# Patient Record
Sex: Female | Born: 1945
Health system: Southern US, Community
[De-identification: ages and names within clinical notes are randomized; demographics above are authoritative.]

## PROBLEM LIST (undated history)

## (undated) DIAGNOSIS — Z8542 Personal history of malignant neoplasm of other parts of uterus: Secondary | ICD-10-CM

## (undated) DIAGNOSIS — K219 Gastro-esophageal reflux disease without esophagitis: Secondary | ICD-10-CM

## (undated) DIAGNOSIS — D473 Essential (hemorrhagic) thrombocythemia: Secondary | ICD-10-CM

## (undated) DIAGNOSIS — K2270 Barrett's esophagus without dysplasia: Secondary | ICD-10-CM

## (undated) DIAGNOSIS — M7710 Lateral epicondylitis, unspecified elbow: Secondary | ICD-10-CM

## (undated) DIAGNOSIS — Z8719 Personal history of other diseases of the digestive system: Secondary | ICD-10-CM

## (undated) DIAGNOSIS — N200 Calculus of kidney: Secondary | ICD-10-CM

## (undated) DIAGNOSIS — J449 Chronic obstructive pulmonary disease, unspecified: Secondary | ICD-10-CM

## (undated) DIAGNOSIS — M6520 Calcific tendinitis, unspecified site: Secondary | ICD-10-CM

## (undated) DIAGNOSIS — R0602 Shortness of breath: Secondary | ICD-10-CM

## (undated) DIAGNOSIS — F32A Depression, unspecified: Secondary | ICD-10-CM

## (undated) DIAGNOSIS — F329 Major depressive disorder, single episode, unspecified: Secondary | ICD-10-CM

## (undated) DIAGNOSIS — E875 Hyperkalemia: Secondary | ICD-10-CM

## (undated) HISTORY — DX: Calcific tendinitis, unspecified site: M65.20

## (undated) HISTORY — DX: Hyperkalemia: E87.5

## (undated) HISTORY — DX: Chronic obstructive pulmonary disease, unspecified: J44.9

## (undated) HISTORY — DX: Essential (hemorrhagic) thrombocythemia: D47.3

## (undated) HISTORY — DX: Personal history of other diseases of the digestive system: Z87.19

## (undated) HISTORY — PX: ABDOMINAL HYSTERECTOMY: SHX81

## (undated) HISTORY — DX: Personal history of malignant neoplasm of other parts of uterus: Z85.42

## (undated) HISTORY — PX: TONSILLECTOMY AND ADENOIDECTOMY: SUR1326

## (undated) HISTORY — DX: Major depressive disorder, single episode, unspecified: F32.9

## (undated) HISTORY — PX: CATARACT EXTRACTION: SUR2

## (undated) HISTORY — DX: Depression, unspecified: F32.A

## (undated) HISTORY — DX: Lateral epicondylitis, unspecified elbow: M77.10

## (undated) HISTORY — DX: Calculus of kidney: N20.0

## (undated) HISTORY — PX: LITHOTRIPSY: SUR834

## (undated) HISTORY — DX: Barrett's esophagus without dysplasia: K22.70

## (undated) HISTORY — DX: Gastro-esophageal reflux disease without esophagitis: K21.9

---

## 1997-11-11 HISTORY — PX: SPLENECTOMY: SUR1306

## 2000-12-07 ENCOUNTER — Emergency Department (HOSPITAL_COMMUNITY): Admission: EM | Admit: 2000-12-07 | Discharge: 2000-12-08 | Payer: Self-pay | Admitting: Emergency Medicine

## 2001-03-31 ENCOUNTER — Other Ambulatory Visit: Admission: RE | Admit: 2001-03-31 | Discharge: 2001-03-31 | Payer: Self-pay | Admitting: Internal Medicine

## 2001-08-13 ENCOUNTER — Inpatient Hospital Stay (HOSPITAL_COMMUNITY): Admission: EM | Admit: 2001-08-13 | Discharge: 2001-08-17 | Payer: Self-pay | Admitting: Emergency Medicine

## 2001-08-13 ENCOUNTER — Encounter: Payer: Self-pay | Admitting: Emergency Medicine

## 2001-08-14 ENCOUNTER — Encounter: Payer: Self-pay | Admitting: Internal Medicine

## 2002-10-19 ENCOUNTER — Encounter: Admission: RE | Admit: 2002-10-19 | Discharge: 2002-10-19 | Payer: Self-pay | Admitting: Internal Medicine

## 2002-10-19 ENCOUNTER — Encounter: Payer: Self-pay | Admitting: Internal Medicine

## 2004-12-25 ENCOUNTER — Ambulatory Visit: Payer: Self-pay | Admitting: Internal Medicine

## 2004-12-25 ENCOUNTER — Ambulatory Visit (HOSPITAL_COMMUNITY): Admission: RE | Admit: 2004-12-25 | Discharge: 2004-12-25 | Payer: Self-pay | Admitting: Internal Medicine

## 2005-01-29 ENCOUNTER — Ambulatory Visit: Payer: Self-pay | Admitting: Internal Medicine

## 2005-03-20 ENCOUNTER — Ambulatory Visit: Payer: Self-pay | Admitting: Internal Medicine

## 2005-06-03 ENCOUNTER — Ambulatory Visit: Payer: Self-pay | Admitting: Internal Medicine

## 2005-07-19 ENCOUNTER — Ambulatory Visit: Payer: Self-pay | Admitting: Internal Medicine

## 2005-07-22 ENCOUNTER — Ambulatory Visit: Payer: Self-pay | Admitting: Cardiology

## 2005-08-19 ENCOUNTER — Ambulatory Visit: Payer: Self-pay | Admitting: Internal Medicine

## 2005-12-10 ENCOUNTER — Ambulatory Visit: Payer: Self-pay | Admitting: Internal Medicine

## 2005-12-10 ENCOUNTER — Observation Stay (HOSPITAL_COMMUNITY): Admission: EM | Admit: 2005-12-10 | Discharge: 2005-12-11 | Payer: Self-pay | Admitting: Emergency Medicine

## 2005-12-18 ENCOUNTER — Ambulatory Visit: Payer: Self-pay | Admitting: Gastroenterology

## 2006-03-11 ENCOUNTER — Ambulatory Visit: Payer: Self-pay | Admitting: Internal Medicine

## 2006-05-02 ENCOUNTER — Ambulatory Visit: Payer: Self-pay | Admitting: Internal Medicine

## 2006-05-26 ENCOUNTER — Ambulatory Visit: Payer: Self-pay | Admitting: Internal Medicine

## 2006-07-26 ENCOUNTER — Ambulatory Visit: Payer: Self-pay | Admitting: Family Medicine

## 2006-08-05 ENCOUNTER — Ambulatory Visit: Payer: Self-pay | Admitting: Internal Medicine

## 2006-08-12 ENCOUNTER — Ambulatory Visit: Payer: Self-pay | Admitting: Critical Care Medicine

## 2006-09-02 ENCOUNTER — Ambulatory Visit: Payer: Self-pay | Admitting: Critical Care Medicine

## 2006-11-14 ENCOUNTER — Ambulatory Visit: Payer: Self-pay | Admitting: Internal Medicine

## 2006-11-17 ENCOUNTER — Ambulatory Visit: Payer: Self-pay | Admitting: Critical Care Medicine

## 2006-12-25 ENCOUNTER — Ambulatory Visit: Payer: Self-pay | Admitting: Internal Medicine

## 2007-03-06 ENCOUNTER — Ambulatory Visit: Payer: Self-pay | Admitting: Critical Care Medicine

## 2007-09-15 DIAGNOSIS — J441 Chronic obstructive pulmonary disease with (acute) exacerbation: Secondary | ICD-10-CM

## 2007-09-15 DIAGNOSIS — K2270 Barrett's esophagus without dysplasia: Secondary | ICD-10-CM | POA: Insufficient documentation

## 2007-09-15 HISTORY — DX: Chronic obstructive pulmonary disease with (acute) exacerbation: J44.1

## 2007-09-26 ENCOUNTER — Encounter: Payer: Self-pay | Admitting: *Deleted

## 2007-09-26 DIAGNOSIS — Z8542 Personal history of malignant neoplasm of other parts of uterus: Secondary | ICD-10-CM | POA: Insufficient documentation

## 2007-09-26 DIAGNOSIS — M6520 Calcific tendinitis, unspecified site: Secondary | ICD-10-CM | POA: Insufficient documentation

## 2007-09-26 DIAGNOSIS — M7710 Lateral epicondylitis, unspecified elbow: Secondary | ICD-10-CM

## 2007-09-26 DIAGNOSIS — Z8719 Personal history of other diseases of the digestive system: Secondary | ICD-10-CM | POA: Insufficient documentation

## 2007-09-26 DIAGNOSIS — Z9079 Acquired absence of other genital organ(s): Secondary | ICD-10-CM

## 2007-09-26 DIAGNOSIS — K219 Gastro-esophageal reflux disease without esophagitis: Secondary | ICD-10-CM | POA: Insufficient documentation

## 2007-09-26 HISTORY — DX: Acquired absence of other genital organ(s): Z90.79

## 2007-09-26 HISTORY — DX: Lateral epicondylitis, unspecified elbow: M77.10

## 2007-12-02 ENCOUNTER — Telehealth: Payer: Self-pay | Admitting: Internal Medicine

## 2008-01-01 ENCOUNTER — Ambulatory Visit: Payer: Self-pay | Admitting: Internal Medicine

## 2008-01-05 LAB — CONVERTED CEMR LAB
ALT: 24 units/L (ref 0–35)
BUN: 14 mg/dL (ref 6–23)
Basophils Absolute: 0.1 10*3/uL (ref 0.0–0.1)
Bilirubin Urine: NEGATIVE
Bilirubin, Direct: 0.1 mg/dL (ref 0.0–0.3)
CO2: 32 meq/L (ref 19–32)
Chloride: 103 meq/L (ref 96–112)
Creatinine, Ser: 0.6 mg/dL (ref 0.4–1.2)
Direct LDL: 187.1 mg/dL
Eosinophils Relative: 3.5 % (ref 0.0–5.0)
HCT: 40.2 % (ref 36.0–46.0)
Hemoglobin, Urine: NEGATIVE
Ketones, ur: NEGATIVE mg/dL
Leukocytes, UA: NEGATIVE
Lymphocytes Relative: 43.3 % (ref 12.0–46.0)
MCHC: 34.2 g/dL (ref 30.0–36.0)
Neutro Abs: 5 10*3/uL (ref 1.4–7.7)
Neutrophils Relative %: 44.8 % (ref 43.0–77.0)
Platelets: 337 10*3/uL (ref 150–400)
Potassium: 4.7 meq/L (ref 3.5–5.1)
RBC: 4.36 M/uL (ref 3.87–5.11)
Sodium: 140 meq/L (ref 135–145)
Total Bilirubin: 0.6 mg/dL (ref 0.3–1.2)
Total Protein, Urine: NEGATIVE mg/dL
Total Protein: 6.5 g/dL (ref 6.0–8.3)
Triglycerides: 168 mg/dL — ABNORMAL HIGH (ref 0–149)
Urine Glucose: >=1000 mg/dL — CR
WBC: 11.1 10*3/uL — ABNORMAL HIGH (ref 4.5–10.5)

## 2008-01-08 ENCOUNTER — Telehealth (INDEPENDENT_AMBULATORY_CARE_PROVIDER_SITE_OTHER): Payer: Self-pay | Admitting: *Deleted

## 2008-01-08 ENCOUNTER — Ambulatory Visit: Payer: Self-pay | Admitting: Endocrinology

## 2008-01-08 ENCOUNTER — Ambulatory Visit: Payer: Self-pay | Admitting: Critical Care Medicine

## 2008-01-08 DIAGNOSIS — J309 Allergic rhinitis, unspecified: Secondary | ICD-10-CM | POA: Insufficient documentation

## 2008-01-08 HISTORY — DX: Allergic rhinitis, unspecified: J30.9

## 2008-01-15 ENCOUNTER — Telehealth: Payer: Self-pay | Admitting: Endocrinology

## 2008-01-19 ENCOUNTER — Ambulatory Visit: Payer: Self-pay | Admitting: Endocrinology

## 2008-02-09 ENCOUNTER — Ambulatory Visit: Payer: Self-pay | Admitting: Internal Medicine

## 2008-02-09 DIAGNOSIS — R0989 Other specified symptoms and signs involving the circulatory and respiratory systems: Secondary | ICD-10-CM

## 2008-02-09 HISTORY — DX: Other specified symptoms and signs involving the circulatory and respiratory systems: R09.89

## 2008-02-12 ENCOUNTER — Encounter: Payer: Self-pay | Admitting: Internal Medicine

## 2008-02-15 ENCOUNTER — Ambulatory Visit: Payer: Self-pay | Admitting: Endocrinology

## 2008-02-15 ENCOUNTER — Encounter: Admission: RE | Admit: 2008-02-15 | Discharge: 2008-05-15 | Payer: Self-pay | Admitting: Endocrinology

## 2008-02-15 ENCOUNTER — Ambulatory Visit: Payer: Self-pay | Admitting: Internal Medicine

## 2008-02-24 ENCOUNTER — Encounter: Admission: RE | Admit: 2008-02-24 | Discharge: 2008-02-24 | Payer: Self-pay | Admitting: Internal Medicine

## 2008-02-25 ENCOUNTER — Ambulatory Visit: Payer: Self-pay

## 2008-05-16 ENCOUNTER — Ambulatory Visit: Payer: Self-pay | Admitting: Endocrinology

## 2008-05-16 LAB — CONVERTED CEMR LAB: Hgb A1c MFr Bld: 7.3 % — ABNORMAL HIGH (ref 4.6–6.0)

## 2008-08-08 ENCOUNTER — Ambulatory Visit: Payer: Self-pay | Admitting: Internal Medicine

## 2008-08-09 LAB — CONVERTED CEMR LAB
ALT: 23 units/L (ref 0–35)
AST: 20 units/L (ref 0–37)
Albumin: 3.7 g/dL (ref 3.5–5.2)
Basophils Absolute: 0.1 10*3/uL (ref 0.0–0.1)
Basophils Relative: 0.8 % (ref 0.0–3.0)
Bilirubin, Direct: 0.1 mg/dL (ref 0.0–0.3)
HCT: 39.2 % (ref 36.0–46.0)
Hgb A1c MFr Bld: 7 % — ABNORMAL HIGH (ref 4.6–6.0)
MCHC: 34.4 g/dL (ref 30.0–36.0)
Neutro Abs: 4.6 10*3/uL (ref 1.4–7.7)
RBC: 4.17 M/uL (ref 3.87–5.11)
RDW: 12.4 % (ref 11.5–14.6)
TSH: 0.89 microintl units/mL (ref 0.35–5.50)
Total Protein: 6.6 g/dL (ref 6.0–8.3)
WBC: 9.7 10*3/uL (ref 4.5–10.5)

## 2008-08-15 ENCOUNTER — Ambulatory Visit: Payer: Self-pay | Admitting: Endocrinology

## 2008-08-15 ENCOUNTER — Ambulatory Visit: Payer: Self-pay | Admitting: Internal Medicine

## 2008-09-23 ENCOUNTER — Telehealth: Payer: Self-pay | Admitting: Endocrinology

## 2008-12-16 ENCOUNTER — Ambulatory Visit: Payer: Self-pay | Admitting: Internal Medicine

## 2008-12-19 ENCOUNTER — Ambulatory Visit: Payer: Self-pay | Admitting: Internal Medicine

## 2008-12-19 LAB — CONVERTED CEMR LAB
GFR calc Af Amer: 161 mL/min
GFR calc non Af Amer: 133 mL/min
TSH: 0.67 microintl units/mL (ref 0.35–5.50)

## 2008-12-30 ENCOUNTER — Ambulatory Visit: Payer: Self-pay | Admitting: Internal Medicine

## 2008-12-30 DIAGNOSIS — R197 Diarrhea, unspecified: Secondary | ICD-10-CM | POA: Insufficient documentation

## 2009-03-26 ENCOUNTER — Encounter: Admission: RE | Admit: 2009-03-26 | Discharge: 2009-03-26 | Payer: Self-pay | Admitting: Orthopaedic Surgery

## 2009-04-13 ENCOUNTER — Telehealth: Payer: Self-pay | Admitting: Internal Medicine

## 2009-04-21 ENCOUNTER — Encounter: Payer: Self-pay | Admitting: Internal Medicine

## 2009-05-12 ENCOUNTER — Encounter: Payer: Self-pay | Admitting: Internal Medicine

## 2009-06-07 ENCOUNTER — Ambulatory Visit: Payer: Self-pay | Admitting: Internal Medicine

## 2009-06-07 LAB — CONVERTED CEMR LAB
AST: 25 units/L (ref 0–37)
Alkaline Phosphatase: 47 units/L (ref 39–117)
BUN: 12 mg/dL (ref 6–23)
Bilirubin, Direct: 0.2 mg/dL (ref 0.0–0.3)
HDL: 40.1 mg/dL (ref >39.00)
Hgb A1c MFr Bld: 7.7 % — ABNORMAL HIGH (ref 4.6–6.5)
Sodium: 142 meq/L (ref 135–145)
TSH: 1.07 microintl units/mL (ref 0.35–5.50)
Total Bilirubin: 0.9 mg/dL (ref 0.3–1.2)
Total CHOL/HDL Ratio: 5
Total Protein: 6.8 g/dL (ref 6.0–8.3)
Triglycerides: 90 mg/dL (ref 0.0–149.0)
VLDL: 18 mg/dL (ref 0.0–40.0)

## 2009-06-12 ENCOUNTER — Encounter: Payer: Self-pay | Admitting: Internal Medicine

## 2009-06-14 ENCOUNTER — Ambulatory Visit: Payer: Self-pay | Admitting: Internal Medicine

## 2009-06-14 ENCOUNTER — Ambulatory Visit: Payer: Self-pay | Admitting: Psychiatry

## 2009-06-14 DIAGNOSIS — F339 Major depressive disorder, recurrent, unspecified: Secondary | ICD-10-CM

## 2009-06-14 HISTORY — DX: Major depressive disorder, recurrent, unspecified: F33.9

## 2009-06-19 ENCOUNTER — Ambulatory Visit: Payer: Self-pay | Admitting: Psychiatry

## 2009-06-26 ENCOUNTER — Ambulatory Visit: Payer: Self-pay | Admitting: Psychiatry

## 2009-07-06 ENCOUNTER — Ambulatory Visit: Payer: Self-pay | Admitting: Psychiatry

## 2009-07-24 ENCOUNTER — Ambulatory Visit: Payer: Self-pay | Admitting: Psychiatry

## 2009-08-21 ENCOUNTER — Ambulatory Visit: Payer: Self-pay | Admitting: Psychiatry

## 2009-09-20 ENCOUNTER — Ambulatory Visit: Payer: Self-pay | Admitting: Psychiatry

## 2009-10-16 ENCOUNTER — Ambulatory Visit: Payer: Self-pay | Admitting: Psychiatry

## 2009-11-20 ENCOUNTER — Ambulatory Visit: Payer: Self-pay | Admitting: Psychiatry

## 2009-12-09 ENCOUNTER — Ambulatory Visit: Payer: Self-pay | Admitting: Family Medicine

## 2009-12-11 ENCOUNTER — Telehealth: Payer: Self-pay | Admitting: Internal Medicine

## 2009-12-12 ENCOUNTER — Ambulatory Visit: Payer: Self-pay | Admitting: Internal Medicine

## 2009-12-15 ENCOUNTER — Telehealth: Payer: Self-pay | Admitting: Internal Medicine

## 2009-12-18 ENCOUNTER — Ambulatory Visit: Payer: Self-pay | Admitting: Psychiatry

## 2009-12-21 ENCOUNTER — Ambulatory Visit: Payer: Self-pay | Admitting: Internal Medicine

## 2010-01-04 ENCOUNTER — Ambulatory Visit: Payer: Self-pay | Admitting: Internal Medicine

## 2010-01-05 LAB — CONVERTED CEMR LAB
ALT: 19 units/L (ref 0–35)
AST: 18 units/L (ref 0–37)
Alkaline Phosphatase: 47 units/L (ref 39–117)
BUN: 15 mg/dL (ref 6–23)
Bilirubin, Direct: 0.1 mg/dL (ref 0.0–0.3)
CO2: 30 meq/L (ref 19–32)
GFR calc non Af Amer: 107.1 mL/min (ref >60)
Sodium: 139 meq/L (ref 135–145)
Total Bilirubin: 0.6 mg/dL (ref 0.3–1.2)

## 2010-01-10 ENCOUNTER — Ambulatory Visit: Payer: Self-pay | Admitting: Internal Medicine

## 2010-01-10 DIAGNOSIS — Z87891 Personal history of nicotine dependence: Secondary | ICD-10-CM

## 2010-01-10 HISTORY — DX: Personal history of nicotine dependence: Z87.891

## 2010-01-15 ENCOUNTER — Ambulatory Visit: Payer: Self-pay | Admitting: Psychiatry

## 2010-02-12 ENCOUNTER — Ambulatory Visit: Payer: Self-pay | Admitting: Psychiatry

## 2010-02-22 ENCOUNTER — Encounter: Payer: Self-pay | Admitting: Internal Medicine

## 2010-03-19 ENCOUNTER — Encounter: Payer: Self-pay | Admitting: Pulmonary Disease

## 2010-03-19 ENCOUNTER — Ambulatory Visit: Payer: Self-pay | Admitting: Pulmonary Disease

## 2010-03-20 ENCOUNTER — Encounter: Payer: Self-pay | Admitting: Internal Medicine

## 2010-03-21 ENCOUNTER — Telehealth (INDEPENDENT_AMBULATORY_CARE_PROVIDER_SITE_OTHER): Payer: Self-pay | Admitting: *Deleted

## 2010-03-21 ENCOUNTER — Ambulatory Visit: Payer: Self-pay | Admitting: Internal Medicine

## 2010-03-21 DIAGNOSIS — J441 Chronic obstructive pulmonary disease with (acute) exacerbation: Secondary | ICD-10-CM

## 2010-03-21 HISTORY — DX: Chronic obstructive pulmonary disease with (acute) exacerbation: J44.1

## 2010-04-02 ENCOUNTER — Ambulatory Visit: Payer: Self-pay | Admitting: Pulmonary Disease

## 2010-04-24 ENCOUNTER — Telehealth: Payer: Self-pay | Admitting: Internal Medicine

## 2010-05-10 ENCOUNTER — Telehealth: Payer: Self-pay | Admitting: Critical Care Medicine

## 2010-05-27 ENCOUNTER — Encounter: Payer: Self-pay | Admitting: Internal Medicine

## 2010-06-26 ENCOUNTER — Ambulatory Visit: Payer: Self-pay | Admitting: Critical Care Medicine

## 2010-06-27 ENCOUNTER — Encounter: Payer: Self-pay | Admitting: Critical Care Medicine

## 2010-07-02 ENCOUNTER — Ambulatory Visit: Payer: Self-pay | Admitting: Internal Medicine

## 2010-07-03 LAB — CONVERTED CEMR LAB
CO2: 27 meq/L (ref 19–32)
Calcium: 9.1 mg/dL (ref 8.4–10.5)
GFR calc non Af Amer: 120.75 mL/min (ref >60)
Potassium: 5.1 meq/L (ref 3.5–5.1)
Sodium: 138 meq/L (ref 135–145)

## 2010-07-06 ENCOUNTER — Ambulatory Visit: Payer: Self-pay | Admitting: Internal Medicine

## 2010-07-27 ENCOUNTER — Ambulatory Visit: Payer: Self-pay | Admitting: Critical Care Medicine

## 2010-09-28 ENCOUNTER — Ambulatory Visit: Payer: Self-pay | Admitting: Internal Medicine

## 2010-12-11 NOTE — Progress Notes (Signed)
Summary: CXR results  Phone Note Call from Patient Call back at Home Phone [phone]   Caller: Patient Call For: alva Summary of Call: pt requests results of cxr.  Initial call taken by: Tivis Ringer, CNA,  Mar 21, 2010 3:57 PM  Follow-up for Phone Call        Pt aware and had verbal understanding that cxr showed chronic changes with COPD, no pneumonia.Michel Bickers CMA  Mar 21, 2010 4:13 PM

## 2010-12-11 NOTE — Medication Information (Signed)
Summary: [facility]  [facility]   Imported By: Sherian Rein 03/23/2010 14:19:08  _____________________________________________________________________  External Attachment:    Type:   Image     Comment:   External Document

## 2010-12-11 NOTE — Progress Notes (Signed)
Summary: Paperwork  Phone Note Call from Patient Call back at [facility] Phone [phone]   Caller: Patient Call For: Tresa Garter MD Complaint: Urinary/GYN Problems Summary of Call: Patient dropped off paperwork to be completed by Dr. Posey Rea.  Per patient, the beginning date should be December 08, 2009. Initial call taken by: Irma Newness,  December 15, 2009 11:05 AM  Follow-up for Phone Call        Awaiting MD completion. Follow-up by: Lucious Groves,  December 15, 2009 1:27 PM  Additional Follow-up for Phone Call Additional follow up Details #1::        I don't have it. Thx Additional Follow-up by: Tresa Garter MD,  December 18, 2009 5:26 PM    Additional Follow-up for Phone Call Additional follow up Details #2::    Pt called to check status of forms. Informed pt that we would call her when complete..........................Marland KitchenLamar Sprinkles, CMA  December 19, 2009 2:37 PM  What is the date of return to work? Georgina Quint Plotnikov MD  December 21, 2009 1:10 PM   Additional Follow-up for Phone Call Additional follow up Details #3:: Details for Additional Follow-up Action Taken: called pt----pt states she was out of work from Jan. 28, 2011 until feb. 4, 2011.   Pt states she returned back to work on Feb. 7, 2011./vg  Thx FMLA papers signed and  left vm for pt to pick up.  Additional Follow-up by: Tresa Garter MD,  December 22, 2009 12:00 PM

## 2010-12-11 NOTE — Assessment & Plan Note (Signed)
Summary: PER PT FU--D/T---STC   Vital Signs:  Patient profile:   65 year old female Weight:      158 pounds Temp:     98.5 degrees F oral Pulse rate:   91 / minute BP sitting:   100 / 74  (left arm)  Vitals Entered By: [name] (January 10, 2010 1:40 PM) CC: f/u Is Patient Diabetic? Yes   Primary Care Provider:  Sonda Primes  CC:  f/u.  History of Present Illness: The patient presents for a follow up of COPD - not better, hypertension, diabetes. C/o urinary incontinence. hard to walk.   Preventive Screening-Counseling & Management  Alcohol-Tobacco     Smoking Status: quit  Current Medications (verified): 1)  Nexium 40 Mg  Cpdr (Esomeprazole Magnesium) .... One By Mouth Once Daily 2)  Advair Diskus 250-50 Mcg/dose  Misc (Fluticasone-Salmeterol) .... One Puff Two Times A Day 3)  Proventil Hfa 108 (90 Base) Mcg/act  Aers (Albuterol Sulfate) .... One To Two Puff Q4h As Needed 4)  Glucophage Xr 500 Mg  Tb24 (Metformin Hcl) .... 2 By Mouth Bid 5)  Januvia 100 Mg  Tabs (Sitagliptin Phosphate) .... Qd 6)  Astelin 137 Mcg/spray  Soln (Azelastine Hcl) .... Two Puff Each Nostril Two Times A Day 7)  Freestyle Lite Test  Strp (Glucose Blood) .... Use As Directed 8)  Freestyle Lancets   Misc (Lancets) .... Use As Directed 9)  Vitamin D3 1000 Unit  Tabs (Cholecalciferol) .... 2 By Mouth Daily 10)  Lomotil 2.5-0.025 Mg Tabs (Diphenoxylate-Atropine) .Marland Kitchen.. 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  Hose assembly line work  Review of Systems       The patient complains of hoarseness, dyspnea on exertion, and prolonged cough.  The patient denies fever and chest pain.    Physical Exam  General:  NAD Nose:  External nasal examination shows no deformity or inflammation. Nasal mucosa are pink and moist without lesions or exudates. Mouth:  WNL  Hoarse Neck:  No deformities, masses, or tenderness noted. Lungs:  CTA Heart:  RRR Abdomen:  soft and non-tender.   Msk:  No deformity or scoliosis noted of thoracic or lumbar spine.   Neurologic:  No cranial nerve deficits noted. Station and gait are normal. Plantar reflexes are down-going bilaterally. DTRs are symmetrical throughout. Sensory, motor and coordinative functions appear intact. Skin:  clear Psych:  Oriented X3.  not suicidal and depressed affect.     Impression & Recommendations:  Problem # 1:  COPD (ICD-496) Assessment Deteriorated Handicapped form for  DMV filled out Her updated medication list for this problem includes:    Advair Diskus 250-50 Mcg/dose Misc (Fluticasone-salmeterol) ..... One puff two times a day    Proventil Hfa 108 (90 Base) Mcg/act Aers (Albuterol sulfate) ..... One to two puff q4h as needed    Duoneb 0.5-2.5 (3) Mg/71ml Soln (Ipratropium-albuterol) .Marland Kitchen... 1 via hhn qid prn  Orders: Home Health Referral [facility]) for [facility]  Problem # 2:  DEPRESSION (ICD-311) Assessment: Unchanged  Problem # 3:  DIABETES, TYPE 2 (  ICD-250.00) Assessment: Unchanged  Her updated medication list for this problem includes:    Glucophage Xr 500 Mg Tb24 (Metformin hcl) .Marland Kitchen... 2 by mouth bid    Januvia 100 Mg Tabs (Sitagliptin phosphate) ..... Qd  Problem # 4:  ALLERGIC RHINITIS (ICD-477.9) Assessment: Unchanged  Her updated medication list for this problem includes:    Astelin 137 Mcg/spray Soln (Azelastine hcl) .Marland Kitchen..Marland Kitchen Two puff each nostril two times a  day  Complete Medication List: 1)  Nexium 40 Mg Cpdr (Esomeprazole magnesium) .... One by mouth once daily 2)  Advair Diskus 250-50 Mcg/dose Misc (Fluticasone-salmeterol) .... One puff two times a day 3)  Proventil Hfa 108 (90 Base) Mcg/act Aers (Albuterol sulfate) .... One to two puff q4h as needed 4)  Glucophage Xr 500 Mg Tb24 (Metformin hcl) .... 2 by mouth bid 5)  Januvia 100 Mg Tabs (Sitagliptin phosphate) .... Qd 6)  Astelin 137 Mcg/spray Soln (Azelastine hcl) .... Two puff each nostril two times a day 7)  Freestyle Lite Test Strp (Glucose blood) .... Use as directed 8)  Freestyle Lancets Misc (Lancets) .... Use as directed 9)  Vitamin D3 1000 Unit Tabs (Cholecalciferol) .... 2 by mouth daily 10)  Lomotil 2.5-0.025 Mg Tabs (Diphenoxylate-atropine) .Marland Kitchen.. 1-2 by mouth qid as needed diarrhea 11)  Hhn  .... As dirrected 12)  Duoneb 0.5-2.5 (3) Mg/38ml Soln (Ipratropium-albuterol) .Marland Kitchen.. 1 via hhn qid prn 13)  Prednisone 10 Mg Tabs (Prednisone) .... Take 40mg  qd for 3 days, then 20 mg qd for 3 days, then 10mg  qd for 6 days, then stop. take pc. 14)  Gelnique 10 % Gel (Oxybutynin chloride) .Marland Kitchen.. 1 pack once daily on skin  Patient Instructions: 1)  Please schedule a follow-up appointment in 3 months. 2)  BMP prior to visit, ICD-9: 3)  Urine-dip prior to visit, ICD-9: 4)  HbgA1C prior to visit, ICD-9:250.00 Prescriptions: ASTELIN 137 MCG/SPRAY  SOLN (AZELASTINE HCL) two puff each nostril two times a day  #1 x 12   Entered and Authorized by:   Tresa Garter MD   Signed by:   Tresa Garter MD on 01/10/2010   Method used:   Print then Give to Patient   RxID:   [id number] FREESTYLE LANCETS   MISC (LANCETS) use as directed  #100 x 3   Entered and Authorized by:   Tresa Garter MD   Signed by:   Tresa Garter MD on 01/10/2010   Method used:   Print then Give to Patient   RxID:   [id number] FREESTYLE LITE TEST  STRP (GLUCOSE BLOOD) use as directed  #100 x 3    Entered and Authorized by:   Tresa Garter MD   Signed by:   Tresa Garter MD on 01/10/2010   Method used:   Print then Give to Patient   RxID:   [id number] JANUVIA 100 MG  TABS (SITAGLIPTIN PHOSPHATE) qd  #30 x 12   Entered and Authorized by:   Tresa Garter MD   Signed by:   Tresa Garter MD on 01/10/2010   Method used:   Print then Give to Patient   RxID:   [id number] GLUCOPHAGE XR 500 MG  TB24 (METFORMIN HCL) 2 by mouth bid  #120 x 12   Entered and Authorized by:   Tresa Garter MD   Signed by:   Tresa Garter MD on 01/10/2010   Method used:   Print then Give to Patient   RxID:  [id number] PROVENTIL HFA 108 (90 BASE) MCG/ACT  AERS (ALBUTEROL SULFATE) one to two puff q4h as needed  #1 x 12   Entered and Authorized by:   Tresa Garter MD   Signed by:   Tresa Garter MD on 01/10/2010   Method used:   Print then Give to Patient   RxID:   [id number] ADVAIR DISKUS 250-50 MCG/DOSE  MISC (FLUTICASONE-SALMETEROL) one puff two times a day Brand medically necessary #1 x 12   Entered and Authorized by:   Tresa Garter MD   Signed by:   Tresa Garter MD on 01/10/2010   Method used:   Print then Give to Patient   RxID:   [id number] NEXIUM 40 MG  CPDR (ESOMEPRAZOLE MAGNESIUM) one by mouth once daily  #30 x 12   Entered and Authorized by:   Tresa Garter MD   Signed by:   Tresa Garter MD on 01/10/2010   Method used:   Print then Give to Patient   RxID:   [id number] GELNIQUE 10 % GEL (OXYBUTYNIN CHLORIDE) 1 pack once daily on skin  #30 x 12   Entered and Authorized by:   Tresa Garter MD   Signed by:   Tresa Garter MD on 01/10/2010   Method used:   Print then Give to Patient   RxID:   [id number] PREDNISONE 10 MG TABS (PREDNISONE) Take 40mg  qd for 3 days, then 20 mg qd for 3 days, then 10mg  qd for 6 days, then stop. Take pc.  #24 x 1   Entered and Authorized by:    Tresa Garter MD   Signed by:   Tresa Garter MD on 01/10/2010   Method used:   Print then Give to Patient   RxID:   [id number] DUONEB 0.5-2.5 (3) MG/3ML SOLN (IPRATROPIUM-ALBUTEROL) 1 via HHN qid prn  #120 x 12   Entered and Authorized by:   Tresa Garter MD   Signed by:   Tresa Garter MD on 01/10/2010   Method used:   Print then Give to Patient   RxID:   [id number] HHN as dirrected  #1 x 0   Entered and Authorized by:   Tresa Garter MD   Signed by:   Tresa Garter MD on 01/10/2010   Method used:   Print then Give to Patient   RxID:   [id number]

## 2010-12-11 NOTE — Assessment & Plan Note (Signed)
Summary: SOB- PW'S PT ///KP   Visit Type:  Sick visit Primary Provider/Referring Provider:  Sonda Primes  CC:  Pt c/o increased SOB worse when walking up stairs, wheezing, productive cough with clear mucus, starting Saturday when waking up. Dr. Lynelle Doctor pt, and last seen 2009.  History of Present Illness: 63/f, ex smoker, with previous history of chronic obstructive lung disease, asthmatic bronchitic component.  The patient's last office visit was in feb 2009 (PW).    Mar 19, 2010 4:30 PM  Acute visit - c/o increased SOB worse when walking up stairs, wheezing, productive cough with clear mucus, starting Saturday when waking up. Has been on advair all this time & using nebs three times a day last 3 days CXR wnl   Current Medications (verified): 1)  Nexium 40 Mg  Cpdr (Esomeprazole Magnesium) .... One By Mouth Once Daily 2)  Advair Diskus 250-50 Mcg/dose  Misc (Fluticasone-Salmeterol) .... One Puff Two Times A Day 3)  Proventil Hfa 108 (90 Base) Mcg/act  Aers (Albuterol Sulfate) .... One To Two Puff Q4h As Needed 4)  Glucophage Xr 500 Mg  Tb24 (Metformin Hcl) .... 2 By Mouth Bid 5)  Januvia 100 Mg  Tabs (Sitagliptin Phosphate) .... Qd 6)  Astelin 137 Mcg/spray  Soln (Azelastine Hcl) .... Two Puff Each Nostril Two Times A Day 7)  Freestyle Lite Test  Strp (Glucose Blood) .... Use As Directed 8)  Freestyle Lancets   Misc (Lancets) .... Use As Directed 9)  Vitamin D3 1000 Unit  Tabs (Cholecalciferol) .... 2 By Mouth Daily 10)  Hhn .... As Dirrected 11)  Duoneb 0.5-2.5 (3) Mg/78ml Soln (Ipratropium-Albuterol) .Marland Kitchen.. 1 Via Hhn Qid Prn 12)  Aleve 220 Mg Tabs (Naproxen Sodium) .... As Needed  Allergies (verified): 1)  Penicillin 2)  Augmentin (Amoxicillin-Pot Clavulanate)  Past History:  Past Medical History: Last updated: 06/14/2009 Hx of CALCIFIC TENDINITIS (ICD-727.82) * Hx of IDIOPATHIC THROMBOCYTOPENIA GASTROESOPHAGEAL REFLUX DISEASE (ICD-530.81) ADENOCARCINOMA, UTERUS, HX OF  (ICD-V10.42) Hx of LATERAL EPICONDYLITIS (ICD-726.32) HIATAL HERNIA, HX OF (ICD-V12.79) BARRETTS ESOPHAGUS (ICD-530.85) COPD (ICD-496)   Diabetes, Type 2 new diagnosis 2/09 Depression  Past Surgical History: Last updated: 09/26/2007 TONSILLECTOMY AND ADENOIDECTOMY, HX OF (ICD-V45.79) HYSTERECTOMY, PARTIAL, HX OF (ICD-V45.77) SPLENECTOMY, HX OF (ICD-V45.79)     Social History: Last updated: 03/19/2010 Patient states former smoker - quit 2006, 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  The patient denies anorexia, fever, weight loss, weight gain, vision loss, decreased hearing, hoarseness, chest pain, syncope, peripheral edema, headaches, hemoptysis, abdominal pain, melena, hematochezia, severe indigestion/heartburn, hematuria, muscle weakness, suspicious skin lesions, difficulty walking, depression, unusual weight change, and abnormal bleeding.    Vital Signs:  Patient profile:   65 year old female Height:      62 inches Weight:      159 pounds BMI:     29.19 O2 Sat:      95 % on Room air Temp:     98.4 degrees F oral Pulse rate:   78 / minute BP sitting:   114 / 70  (left arm) Cuff size:   regular  Vitals Entered By: Zackery Barefoot CMA (Mar 19, 2010 4:01 PM)  O2 Flow:  Room air CC: Pt c/o increased SOB worse when  walking up stairs, wheezing, productive cough with clear mucus, starting Saturday when waking up. Dr. Lynelle Doctor pt, last seen 2009 Comments Medications reviewed with patient Verified contact number and pharmacy with patient Zackery Barefoot CMA  Mar 19, 2010 4:02 PM    Physical Exam  Additional Exam:  Gen. Pleasant, well-nourished, in no distress, normal affect ENT - no lesions, no post nasal drip Neck: No JVD, no thyromegaly, no carotid bruits Lungs:  no use of accessory muscles, no dullness to percussion, faint rhonchi  Cardiovascular: Rhythm regular, heart sounds  normal, no murmurs or gallops, no peripheral edema Abdomen: soft and non-tender, no hepatosplenomegaly, BS normal. Musculoskeletal: No deformities, no cyanosis or clubbing Neuro:  alert, non focal     Impression & Recommendations:  Problem # 1:  C O P D WITH ACUTE EXACERBATION (ICD-491.21)  steroid course - (do not take aleve) she will call for abx if sputum changes color. cxr reviewed = wnl ct advair & duonebs estimate lung function in the future  Orders: Est. Patient Level IV (16109) Prescription Created Electronically [id number])  Problem # 2:  DIABETES, TYPE 2 (ICD-250.00)  monitor suagrs on steroids Her updated medication list for this problem includes:    Glucophage Xr 500 Mg Tb24 (Metformin hcl) .Marland Kitchen... 2 by mouth bid    Januvia 100 Mg Tabs (Sitagliptin phosphate) ..... Qd  Orders: Est. Patient Level IV (09811) Prescription Created Electronically [id number])  Medications Added to Medication List This Visit: 1)  Aleve 220 Mg Tabs (Naproxen sodium) .... As needed 2)  Prednisone 10 Mg Tabs (Prednisone) .... Take 4 tabs  daily with food x 3 days, then 3 tabs daily x 3 days, then 2 tabs daily x 3 days, then 1 tab daily x3 days then stop. #40  Other Orders: T-2 View CXR ([id number])  Patient Instructions: 1)  Copy sent to: Dr Posey Rea 2)  Please schedule a follow-up appointment in 2 weeks with TP 3)  A chest x-ray has been recommended.  Your imaging study may require preauthorization.  4)  Take prednisone as directed 5)  use advair two times a day  6)  Use duonebs as needed only if proventil MDI not working Prescriptions: PREDNISONE 10 MG TABS (PREDNISONE) Take 4 tabs  daily with food x 3 days, then 3 tabs daily x 3 days, then 2 tabs daily x 3 days, then 1 tab daily x3 days then stop. #40  #40 x 0   Entered and Authorized by:   [name] Vassie Loll MD   Signed by:    [name] Vassie Loll MD on 03/19/2010   Method used:   Electronically to        [facility]  [address] [phone]* (retail)       [address]       [place], [place]  [address]       Ph: [phone] or [phone]       Fax: [phone]   RxID:   [id number]

## 2010-12-11 NOTE — Miscellaneous (Signed)
Summary: [facility]   Imported By: Lester Montier 02/28/2010 07:42:25  _____________________________________________________________________  External Attachment:    Type:   Image     Comment:   External Document

## 2010-12-11 NOTE — Assessment & Plan Note (Signed)
Summary: 2 weeks/apc   Primary Provider/Referring Provider:  Sonda Primes  CC:  2 week follow up COPD -.  History of Present Illness: 65/f, ex smoker, with previous history of chronic obstructive lung disease, asthmatic bronchitic component.  The patient's last office visit was in feb 2009 (PW).    Mar 19, 2010 4:30 PM  Acute visit - c/o increased SOB worse when walking up stairs, wheezing, productive cough with clear mucus, starting Saturday when waking up. Has been on advair all this time & using nebs three times a day last 3 days CXR wnl  Apr 02, 2010--Presents for 2 week follow up COPD . Seen last visit w/ COPD flare -tx w/ steroid taper. CXR w/ chronic changes no acute findings. She is feeling better, cough and wheezing are less. Not quite back to baseline but improved. No fever or discolored mucus. Denies chest pain,  orthopnea, hemoptysis, fever, n/v/d, edema, headache.   Medications Prior to Update: 1)  Nexium 40 Mg  Cpdr (Esomeprazole Magnesium) .... One By Mouth Once Daily 2)  Advair Diskus 250-50 Mcg/dose  Misc (Fluticasone-Salmeterol) .... One Puff Two Times A Day 3)  Proventil Hfa 108 (90 Base) Mcg/act  Aers (Albuterol Sulfate) .... One To Two Puff Q4h As Needed 4)  Glucophage Xr 500 Mg  Tb24 (Metformin Hcl) .... 2 By Mouth Bid 5)  Januvia 100 Mg  Tabs (Sitagliptin Phosphate) .... Qd 6)  Astelin 137 Mcg/spray  Soln (Azelastine Hcl) .... Two Puff Each Nostril Two Times A Day 7)  Freestyle Lite Test  Strp (Glucose Blood) .... Use As Directed 8)  Freestyle Lancets   Misc (Lancets) .... Use As Directed 9)  Vitamin D3 1000 Unit  Tabs (Cholecalciferol) .... 2 By Mouth Daily 10)  Hhn .... As Dirrected 11)  Duoneb 0.5-2.5 (3) Mg/31ml Soln (Ipratropium-Albuterol) .Marland Kitchen.. 1 Via Hhn Qid Prn 12)  Aleve 220 Mg Tabs (Naproxen Sodium) .... As Needed  Current Medications (verified): 1)  Nexium 40 Mg  Cpdr (Esomeprazole Magnesium) .... One By Mouth Once Daily 2)  Advair Diskus 250-50  Mcg/dose  Misc (Fluticasone-Salmeterol) .... One Puff Two Times A Day 3)  Proventil Hfa 108 (90 Base) Mcg/act  Aers (Albuterol Sulfate) .... One To Two Puff Q4h As Needed 4)  Glucophage Xr 500 Mg  Tb24 (Metformin Hcl) .... 2 By Mouth Bid 5)  Januvia 100 Mg  Tabs (Sitagliptin Phosphate) .... Qd 6)  Astelin 137 Mcg/spray  Soln (Azelastine Hcl) .... Two Puff Each Nostril Two Times A Day 7)  Freestyle Lite Test  Strp (Glucose Blood) .... Use As Directed 8)  Freestyle Lancets   Misc (Lancets) .... Use As Directed 9)  Vitamin D3 1000 Unit  Tabs (Cholecalciferol) .... 2 By Mouth Daily 10)  Hhn .... As Dirrected 11)  Duoneb 0.5-2.5 (3) Mg/51ml Soln (Ipratropium-Albuterol) .Marland Kitchen.. 1 Via Hhn Qid Prn 12)  Aleve 220 Mg Tabs (Naproxen Sodium) .... As Needed  Allergies (verified): 1)  Penicillin 2)  Augmentin (Amoxicillin-Pot Clavulanate)  Past History:  Past Medical History: Last updated: 06/14/2009 Hx of CALCIFIC TENDINITIS (ICD-727.82) * Hx of IDIOPATHIC THROMBOCYTOPENIA GASTROESOPHAGEAL REFLUX DISEASE (ICD-530.81) ADENOCARCINOMA, UTERUS, HX OF (ICD-V10.42) Hx of LATERAL EPICONDYLITIS (ICD-726.32) HIATAL HERNIA, HX OF (ICD-V12.79) BARRETTS ESOPHAGUS (ICD-530.85) COPD (ICD-496)   Diabetes, Type 2 new diagnosis 2/09 Depression  Past Surgical History: Last updated: 09/26/2007 TONSILLECTOMY AND ADENOIDECTOMY, HX OF (ICD-V45.79) HYSTERECTOMY, PARTIAL, HX OF (ICD-V45.77) SPLENECTOMY, HX OF (ICD-V45.79)     Family History: Last updated: 01/08/2008 sister with liver cancer  and expired C V A / Stroke:   Social History: Last updated: 03/19/2010 Patient states former smoker - quit 2006, 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 Yes Comments Medications reviewed with patient Daytime contact number verified with patient. Boone Master CNA/MA  Apr 02, 2010 4:54 PM    Physical Exam  Additional Exam:  Gen. Pleasant, well-nourished, in no distress, normal affect ENT - no lesions, no post nasal drip Neck: No JVD, no thyromegaly, no carotid bruits Lungs: no use of accessory muscles, no dullness to percussion, Cardiovascular: Rhythm regular, heart sounds  normal, no murmurs or gallops, no peripheral edema Abdomen: soft and non-tender, no hepatosplenomegaly, BS normal. Musculoskeletal: No deformities, no cyanosis or clubbing Neuro:  alert, non focal     Impression & Recommendations:  Problem # 1:  C O P D WITH ACUTE EXACERBATION (ICD-491.21)  Recent flare-now resolving.  REC:  Mucinex DM two times a day as needed cough/congesiton  Take Advair 250/50 1 puff two times a day , brush /rinse and gargle after use.  follow up Dr. Delford Field in 6 weeks w/ PFTs  Please contact office for sooner follow up if symptoms do not improve or worsen   Orders: Est. Patient Level II (44034)  Complete Medication List: 1)  Nexium 40 Mg Cpdr (Esomeprazole magnesium) .... One by mouth once daily 2)  Advair Diskus 250-50 Mcg/dose Misc (Fluticasone-salmeterol) .... One puff two times a day 3)  Proventil Hfa 108 (90 Base) Mcg/act Aers (Albuterol sulfate) .... One to two puff q4h as needed 4)  Glucophage Xr 500 Mg Tb24 (Metformin hcl) .... 2 by mouth bid 5)  Januvia 100 Mg Tabs (Sitagliptin phosphate) .... Qd 6)  Astelin 137 Mcg/spray Soln (Azelastine hcl) .... Two puff each nostril two times a day 7)  Freestyle Lite Test Strp (Glucose blood) .... Use as directed 8)  Freestyle Lancets Misc (Lancets) .... Use as  directed 9)  Vitamin D3 1000 Unit Tabs (Cholecalciferol) .... 2 by mouth daily 10)  Hhn  .... As dirrected 11)  Duoneb 0.5-2.5 (3) Mg/3ml Soln (Ipratropium-albuterol) .Marland Kitchen.. 1 via hhn qid prn 12)  Aleve 220 Mg Tabs (Naproxen sodium) .... As needed  Patient Instructions: 1)  Mucinex DM two times a day as needed cough/congesiton  2)  Take Advair 250/50 1 puff two times a day , brush /rinse and gargle after use.  3)  follow up Dr. Delford Field in 6 weeks w/ PFTs  4)  Please contact office for sooner follow up if symptoms do not improve or worsen

## 2010-12-11 NOTE — Progress Notes (Signed)
Summary: nos appt  Phone Note Call from Patient   Caller: [email or web address]  Call For: Shambhavi Salley Summary of Call: LMTCB x2 to rsc nos from 6/29. Initial call taken by: Darletta Moll,  May 10, 2010 3:46 PM

## 2010-12-11 NOTE — Progress Notes (Signed)
Summary: nexium coverage  Phone Note Call from Patient Call back at Home Phone [phone]   Caller: Patient Summary of Call: Patient called stating that insurance will no longer pay for nexium and request and alternative. please advise Initial call taken by: Rock Nephew CMA,  April 24, 2010 2:22 PM  Follow-up for Phone Call        Spoke with patient and he has not tried Omeprazole before. He is aware to try Omeprazole for at least 30 days and call back to the office if it does not work, so I can try prior authorization for Nexium. Follow-up by: Lucious Groves,  April 24, 2010 4:15 PM    New/Updated Medications: OMEPRAZOLE 20 MG CPDR (OMEPRAZOLE) 1 by mouth qd Prescriptions: OMEPRAZOLE 20 MG CPDR (OMEPRAZOLE) 1 by mouth qd  #30 x 6   Entered by:   Lucious Groves   Authorized by:   Tresa Garter MD   Signed by:   Lucious Groves on 04/24/2010   Method used:   Electronically to        [facility]  [address] [phone]* (retail)       [address]       [place], [place]  [address]       Ph: [phone] or [phone]       Fax: [phone]   RxID:   [id number]

## 2010-12-11 NOTE — Assessment & Plan Note (Signed)
Summary: NAUSEA/ DIARRHEA /NWS   Vital Signs:  Patient profile:   65 year old female Weight:      155 pounds Temp:     98.1 degrees F oral Pulse rate:   84 / minute Pulse rhythm:   regular BP sitting:   128 / 82  (left arm) Cuff size:   regular  Vitals Entered By: Lowella Petties CMA (December 09, 2009 9:40 AM) CC: Diarrhea, nausea, cough-chest burns with coughing.   Primary Care Provider:  Sonda Primes  CC:  Diarrhea, nausea, and cough-chest burns with coughing.Marland Kitchen  History of Present Illness: 65 y/o fem exsmoker x 3 yrs w 3 weeks h/o of nonprod. cough a 1 days h/o of diarrhea w/o fever ect.  Allergies: 1)  Penicillin 2)  Augmentin (Amoxicillin-Pot Clavulanate)  Past History:  Past medical, surgical, family and social histories (including risk factors) reviewed for relevance to current acute and chronic problems.  Past Medical History: Reviewed history from 06/14/2009 and no changes required. Hx of CALCIFIC TENDINITIS (ICD-727.82) * Hx of IDIOPATHIC THROMBOCYTOPENIA GASTROESOPHAGEAL REFLUX DISEASE (ICD-530.81) ADENOCARCINOMA, UTERUS, HX OF (ICD-V10.42) Hx of LATERAL EPICONDYLITIS (ICD-726.32) HIATAL HERNIA, HX OF (ICD-V12.79) BARRETTS ESOPHAGUS (ICD-530.85) COPD (ICD-496)   Diabetes, Type 2 new diagnosis 2/09 Depression  Past Surgical History: Reviewed history from 09/26/2007 and no changes required. TONSILLECTOMY AND ADENOIDECTOMY, HX OF (ICD-V45.79) HYSTERECTOMY, PARTIAL, HX OF (ICD-V45.77) SPLENECTOMY, HX OF (ICD-V45.79)     Family History: Reviewed history from 01/08/2008 and no changes required. sister with liver cancer and expired C V A / Stroke:   Social History: Reviewed history from 01/08/2008 and no changes required. 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  Hose assembly line work  Review of Systems      See HPI  Physical Exam  General:  Well-developed,well-nourished,in no acute distress; alert,appropriate and cooperative throughout  examination Head:  Normocephalic and atraumatic without obvious abnormalities. No apparent alopecia or balding. Eyes:  No corneal or conjunctival inflammation noted. EOMI. Perrla. Funduscopic exam benign, without hemorrhages, exudates or papilledema. Vision grossly normal. Ears:  External ear exam shows no significant lesions or deformities.  Otoscopic examination reveals clear canals, tympanic membranes are intact bilaterally without bulging, retraction, inflammation or discharge. Hearing is grossly normal bilaterally. Nose:  External nasal examination shows no deformity or inflammation. Nasal mucosa are pink and moist without lesions or exudates. Mouth:  Oral mucosa and oropharynx without lesions or exudates.  Teeth in good repair. Neck:  No deformities, masses, or tenderness noted. Chest Wall:  No deformities, masses, or tenderness noted. Lungs:  sym., but dec. BS w late exp wheezing   Impression & Recommendations:  Problem # 1:  DIARRHEA (ICD-787.91) Assessment Deteriorated  Problem # 2:  COPD (ICD-496)  Her updated medication list for this problem includes:    Advair Diskus 250-50 Mcg/dose Misc (Fluticasone-salmeterol) ..... One puff two times a day    Proventil Hfa 108 (90 Base) Mcg/act Aers (Albuterol sulfate) ..... One to two puff q4h as needed  Complete Medication List: 1)  Nexium 40 Mg Cpdr (Esomeprazole magnesium) .... One by mouth once daily 2)  Advair Diskus 250-50 Mcg/dose Misc (Fluticasone-salmeterol) .... One puff two times a day 3)  Proventil Hfa 108 (90 Base) Mcg/act Aers (Albuterol sulfate) .... One to two puff q4h as needed 4)  Glucophage Xr 500 Mg Tb24 (Metformin hcl) .... 2 by mouth bid 5)  Januvia 100 Mg Tabs (Sitagliptin phosphate) .... Qd 6)  Astelin 137 Mcg/spray Soln (Azelastine hcl) .... Two puff each nostril two times  a day 7)  Freestyle Lite Test Strp (Glucose blood) .... Use as directed 8)  Freestyle Lancets Misc (Lancets) .... Use as directed 9)  Vitamin  D3 1000 Unit Tabs (Cholecalciferol) .... 2 by mouth daily  Patient Instructions: 1)  clear liq. diet, inc advair to 2 puffs 2 x day.call to see Dr. Wendi Maya on mon for recheck

## 2010-12-11 NOTE — Assessment & Plan Note (Signed)
Summary: 3 MTH FU  STC   Vital Signs:  Patient profile:   65 year old female Height:      63 inches Weight:      159 pounds BMI:     28.27 O2 Sat:      95 % on Room air Temp:     98.5 degrees F oral Pulse rate:   70 / minute Pulse rhythm:   regular Resp:     16 per minute BP sitting:   120 / 60  (right arm) Cuff size:   regular  Vitals Entered By: Lanier Prude, CMA(AAMA) (July 06, 2010 1:49 PM)  O2 Flow:  Room air CC: 3 mo f/u Is Patient Diabetic? Yes   Primary Care Provider:  Sonda Primes  CC:  3 mo f/u.  History of Present Illness: The patient presents for a follow up of hypertension, diabetes, hyperlipidemia, COPD. On Predn taper for 3d time in 3 months    Current Medications (verified): 1)  Dulera 200-5 Mcg/act Aero (Mometasone Furo-Formoterol Fum) .... 2 Puffs Twice Per Day 2)  Proventil Hfa 108 (90 Base) Mcg/act  Aers (Albuterol Sulfate) .... One To Two Puff Q4h As Needed 3)  Glucophage Xr 500 Mg  Tb24 (Metformin Hcl) .... 2 By Mouth Bid 4)  Januvia 100 Mg  Tabs (Sitagliptin Phosphate) .... Qd 5)  Astelin 137 Mcg/spray  Soln (Azelastine Hcl) .... Two Puff Each Nostril Two Times A Day As Needed 6)  Freestyle Lite Test  Strp (Glucose Blood) .... Use As Directed 7)  Freestyle Lancets   Misc (Lancets) .... Use As Directed 8)  Vitamin D3 1000 Unit  Tabs (Cholecalciferol) .... 2 By Mouth Daily 9)  Hhn .... As Dirrected 10)  Duoneb 0.5-2.5 (3) Mg/67ml Soln (Ipratropium-Albuterol) .Marland Kitchen.. 1 Via Hhn Qid Prn 11)  Aleve 220 Mg Tabs (Naproxen Sodium) .... As Needed 12)  Omeprazole 20 Mg Cpdr (Omeprazole) .Marland Kitchen.. 1 By Mouth Qd 13)  Prednisone 10 Mg  Tabs (Prednisone) .... Take As Directed 4 Each Am X 4 Days, 3 X 4 Days, 2 X 4 Days, 1 X 4 Days Then Stop 14)  Nasonex 50 Mcg/act  Susp (Mometasone Furoate) .... Two Puffs Each Nostril Daily  Allergies (verified): 1)  Penicillin 2)  Augmentin (Amoxicillin-Pot Clavulanate)  Past History:  Past Medical History: Last updated:  06/14/2009 Hx of CALCIFIC TENDINITIS (ICD-727.82) * Hx of IDIOPATHIC THROMBOCYTOPENIA GASTROESOPHAGEAL REFLUX DISEASE (ICD-530.81) ADENOCARCINOMA, UTERUS, HX OF (ICD-V10.42) Hx of LATERAL EPICONDYLITIS (ICD-726.32) HIATAL HERNIA, HX OF (ICD-V12.79) BARRETTS ESOPHAGUS (ICD-530.85) COPD (ICD-496)   Diabetes, Type 2 new diagnosis 2/09 Depression  Past Surgical History: Last updated: 09/26/2007 TONSILLECTOMY AND ADENOIDECTOMY, HX OF (ICD-V45.79) HYSTERECTOMY, PARTIAL, HX OF (ICD-V45.77) SPLENECTOMY, HX OF (ICD-V45.79)     Social History: Last updated: 03/19/2010 Patient states former smoker - quit 2006, 2 PPD started @ age 67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  The patient denies fever and chest pain.    Physical Exam  General:  NAD Ears:  External ear exam shows no significant lesions or deformities.  Otoscopic examination reveals clear canals, tympanic membranes are intact bilaterally without bulging, retraction, inflammation or discharge. Hearing is grossly normal bilaterally. Nose:  External nasal examination shows no deformity or inflammation. Nasal mucosa are pink and moist without lesions or exudates. Mouth:  WNL  Hoarse Neck:  No deformities, masses, or tenderness noted. Lungs:  CTA Heart:  RRR Abdomen:  soft and non-tender.   Msk:  No deformity or scoliosis noted of thoracic or lumbar spine.   Neurologic:  No cranial nerve deficits noted. Station and gait are normal. Plantar reflexes are down-going bilaterally. DTRs are symmetrical throughout. Sensory, motor and coordinative functions appear intact. Skin:  clear Psych:  Oriented X3.  not suicidal and depressed affect.     Impression & Recommendations:  Problem # 1:  DIABETES, TYPE 2 (ICD-250.00) Assessment Deteriorated Worse due to steroids The following medications were removed from the medication list:    Glucophage Xr 500 Mg  Tb24 (Metformin hcl) .Marland Kitchen... 2 by mouth bid Her updated medication list for this problem includes:    Januvia 100 Mg Tabs (Sitagliptin phosphate) ..... Qd    Actoplus Met Xr 15-1000 Mg Xr24h-tab (Pioglitazone hcl-metformin hcl) .Marland Kitchen... 1 by mouth bid  Problem # 2:  C O P D WITH ACUTE EXACERBATION (ICD-491.21) On steroids lately  Problem # 3:  DEPRESSION (ICD-311) Assessment: Improved  Problem # 4:  EDEMA (ICD-782.3) Assessment: Improved  Complete Medication List: 1)  Dulera 200-5 Mcg/act Aero (Mometasone furo-formoterol fum) .... 2 puffs twice per day 2)  Proventil Hfa 108 (90 Base) Mcg/act Aers (Albuterol sulfate) .... One to two puff q4h as needed 3)  Januvia 100 Mg Tabs (Sitagliptin phosphate) .... Qd 4)  Astelin 137 Mcg/spray Soln (Azelastine hcl) .... Two puff each nostril two times a day as needed 5)  Freestyle Lite Test Strp (Glucose blood) .... Use as directed 6)  Freestyle Lancets Misc (Lancets) .... Use as directed 7)  Vitamin D3 1000 Unit Tabs (Cholecalciferol) .... 2 by mouth daily 8)  Hhn  .... As dirrected 9)  Duoneb 0.5-2.5 (3) Mg/56ml Soln (Ipratropium-albuterol) .Marland Kitchen.. 1 via hhn qid prn 10)  Aleve 220 Mg Tabs (Naproxen sodium) .... As needed 11)  Prednisone 10 Mg Tabs (Prednisone) .... Take as directed 4 each am x 4 days, 3 x 4 days, 2 x 4 days, 1 x 4 days then stop 12)  Nasonex 50 Mcg/act Susp (Mometasone furoate) .... Two puffs each nostril daily 13)  Actoplus Met Xr 15-1000 Mg Xr24h-tab (Pioglitazone hcl-metformin hcl) .Marland Kitchen.. 1 by mouth bid 14)  Omeprazole 40 Mg Cpdr (Omeprazole) .Marland Kitchen.. 1 by mouth qam for indigestion  Patient Instructions: 1)  Please schedule a follow-up appointment in 3 months. 2)  BMP prior to visit, ICD-9: 3)  Hepatic Panel prior to visit, ICD-9:250.02 4)  HbgA1C prior to visit, ICD-9: 5)  Stop Metformin and start Actos plus met 1 two times a day  Prescriptions: OMEPRAZOLE 40 MG CPDR (OMEPRAZOLE) 1 by mouth qam for indigestion  #90 x 3   Entered and  Authorized by:   Tresa Garter MD   Signed by:   Tresa Garter MD on 07/06/2010   Method used:   Print then Give to Patient   RxID:   [id number] ACTOPLUS MET XR 15-1000 MG XR24H-TAB (PIOGLITAZONE HCL-METFORMIN HCL) 1 by mouth bid  #60 x 12   Entered and Authorized by:   Tresa Garter MD   Signed by:   Tresa Garter MD on 07/06/2010   Method used:   Print then Give to Patient   RxID:   [id number]

## 2010-12-11 NOTE — Letter (Signed)
Summary: [employer]  [address]   [place], [place] [address]   Phone: [phone]  Fax: [phone]    Today's Date: Mar 19, 2010  Name of Patient: Pam Gamble  The above named patient had a medical visit today at:  3:45pm.  Please take this into consideration when reviewing the time away from work/school.    Special Instructions:  [x ] None  [  ] To be off the remainder of today, returning to the normal work / school schedule tomorrow.  [  ] To be off until the next scheduled appointment on ______________________.  [  ] Other ________________________________________________________________ ________________________________________________________________________   Sincerely yours,   Zackery Barefoot CMA/Dr. Cyril Mourning

## 2010-12-11 NOTE — Assessment & Plan Note (Signed)
Summary: seen at sat clinic/continued problems /SD   Vital Signs:  Patient profile:   65 year old female Weight:      158 pounds Temp:     97.5 degrees F oral Pulse rate:   76 / minute BP sitting:   90 / 56  (left arm)  Vitals Entered By: [name] (December 12, 2009 11:20 AM) CC: ongoing problems--no better Is Patient Diabetic? Yes   Primary Care Provider:  Sonda Primes  CC:  ongoing problems--no better.  History of Present Illness: The patient presents with complaints of sore throat, diarrhea, fever, cough, sinus congestion and drainge of several days duration. Not better with OTC meds. Chest hurts with coughing. Can't sleep due to cough. Muscle aches are present.  The mucus is colored. Had a fever last night and took 1 Cipro.   Preventive Screening-Counseling & Management  Alcohol-Tobacco     Smoking Status: quit  Current Medications (verified): 1)  Nexium 40 Mg  Cpdr (Esomeprazole Magnesium) .... One By Mouth Once Daily 2)  Advair Diskus 250-50 Mcg/dose  Misc (Fluticasone-Salmeterol) .... One Puff Two Times A Day 3)  Proventil Hfa 108 (90 Base) Mcg/act  Aers (Albuterol Sulfate) .... One To Two Puff Q4h As Needed 4)  Glucophage Xr 500 Mg  Tb24 (Metformin Hcl) .... 2 By Mouth Bid 5)  Januvia 100 Mg  Tabs (Sitagliptin Phosphate) .... Qd 6)  Astelin 137 Mcg/spray  Soln (Azelastine Hcl) .... Two Puff Each Nostril Two Times A Day 7)  Freestyle Lite Test  Strp (Glucose Blood) .... Use As Directed 8)  Freestyle Lancets   Misc (Lancets) .... Use As Directed 9)  Vitamin D3 1000 Unit  Tabs (Cholecalciferol) .... 2 By Mouth Daily  Allergies: 1)  Penicillin 2)  Augmentin (Amoxicillin-Pot Clavulanate)  Past History:  Past Medical History: Last updated: 06/14/2009 Hx of CALCIFIC TENDINITIS (ICD-727.82) * Hx of IDIOPATHIC THROMBOCYTOPENIA GASTROESOPHAGEAL REFLUX DISEASE (ICD-530.81) ADENOCARCINOMA, UTERUS, HX OF (ICD-V10.42) Hx of LATERAL EPICONDYLITIS  (ICD-726.32) HIATAL HERNIA, HX OF (ICD-V12.79) BARRETTS ESOPHAGUS (ICD-530.85) COPD (ICD-496)   Diabetes, Type 2 new diagnosis 2/09 Depression  Past Surgical History: Last updated: 09/26/2007 TONSILLECTOMY AND ADENOIDECTOMY, HX OF (ICD-V45.79) HYSTERECTOMY, PARTIAL, HX OF (ICD-V45.77) SPLENECTOMY, HX OF (ICD-V45.79)     Social History: Last updated: 01/08/2008 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 Hoarse Lungs:  CTA Heart:  RRR Abdomen:  soft and non-tender.   Msk:  No deformity or scoliosis noted of thoracic or lumbar spine.   Neurologic:  No cranial nerve deficits noted. Station and gait are normal. Plantar reflexes are down-going bilaterally. DTRs are symmetrical throughout. Sensory, motor and coordinative functions appear intact.   Impression & Recommendations:  Problem # 1:  BRONCHITIS, ACUTE (ICD-466.0) Assessment Unchanged  Her updated medication list for this problem includes:    Advair Diskus 250-50 Mcg/dose Misc (Fluticasone-salmeterol) ..... One puff two times a day    Proventil Hfa 108 (90 Base) Mcg/act Aers (Albuterol sulfate) ..... One to two puff q4h as needed    Ciprofloxacin Hcl 500 Mg Tabs (Ciprofloxacin hcl) .Marland Kitchen... 1 by mouth bid  Problem # 2:  DIARRHEA (ICD-787.91) Assessment: Unchanged  Her updated medication list for this problem includes:    Lomotil 2.5-0.025 Mg Tabs (Diphenoxylate-atropine) .Marland Kitchen... 1-2 by mouth qid as needed diarrhea  Problem # 3:  DIABETES, TYPE 2 (ICD-250.00) Assessment: Unchanged  Her updated medication list for this problem  includes:    Glucophage Xr 500 Mg Tb24 (Metformin hcl) .Marland Kitchen... 2 by mouth bid    Januvia 100 Mg Tabs (Sitagliptin phosphate) ..... Qd  Problem # 4:  COPD (ICD-496) Assessment: Deteriorated  Her updated medication list for this problem includes:    Advair Diskus 250-50 Mcg/dose Misc  (Fluticasone-salmeterol) ..... One puff two times a day    Proventil Hfa 108 (90 Base) Mcg/act Aers (Albuterol sulfate) ..... One to two puff q4h as needed  Complete Medication List: 1)  Nexium 40 Mg Cpdr (Esomeprazole magnesium) .... One by mouth once daily 2)  Advair Diskus 250-50 Mcg/dose Misc (Fluticasone-salmeterol) .... One puff two times a day 3)  Proventil Hfa 108 (90 Base) Mcg/act Aers (Albuterol sulfate) .... One to two puff q4h as needed 4)  Glucophage Xr 500 Mg Tb24 (Metformin hcl) .... 2 by mouth bid 5)  Januvia 100 Mg Tabs (Sitagliptin phosphate) .... Qd 6)  Astelin 137 Mcg/spray Soln (Azelastine hcl) .... Two puff each nostril two times a day 7)  Freestyle Lite Test Strp (Glucose blood) .... Use as directed 8)  Freestyle Lancets Misc (Lancets) .... Use as directed 9)  Vitamin D3 1000 Unit Tabs (Cholecalciferol) .... 2 by mouth daily 10)  Lomotil 2.5-0.025 Mg Tabs (Diphenoxylate-atropine) .Marland Kitchen.. 1-2 by mouth qid as needed diarrhea 11)  Ciprofloxacin Hcl 500 Mg Tabs (Ciprofloxacin hcl) .Marland Kitchen.. 1 by mouth bid  Patient Instructions: 1)  Call if you are not better in a reasonable amount of time or if worse. Go to ER if feeling really bad! Prescriptions: CIPROFLOXACIN HCL 500 MG TABS (CIPROFLOXACIN HCL) 1 by mouth bid  #20 x 1   Entered and Authorized by:   Tresa Garter MD   Signed by:   Tresa Garter MD on 12/12/2009   Method used:   Print then Give to Patient   RxID:   [id number] LOMOTIL 2.5-0.025 MG TABS (DIPHENOXYLATE-ATROPINE) 1-2 by mouth qid as needed diarrhea  #60 x 1   Entered and Authorized by:   Tresa Garter MD   Signed by:   Tresa Garter MD on 12/12/2009   Method used:   Print then Give to Patient   RxID:   [id number]

## 2010-12-11 NOTE — Miscellaneous (Signed)
Summary: Orders Update pft charges  Clinical Lists Changes  Orders: Added new Service order of Carbon Monoxide diffusing w/capacity (94720) - Signed Added new Service order of Lung Volumes (94240) - Signed Added new Service order of Spirometry (Pre & Post) (94060) - Signed 

## 2010-12-11 NOTE — Assessment & Plan Note (Signed)
Summary: Pulmonary OV   Primary Provider/Referring Provider:  Sonda Primes  CC:  1 month follow up.  Pt states breathing is "so so."  Nonprod cough.  wheezing at times.  Denies chest tightness and SOB.Marland Kitchen  History of Present Illness: 64/f, ex smoker, with previous history of chronic obstructive lung disease, asthmatic bronchitic component.  The patient's last office visit was in feb 2009 (PW).      July 27, 2010 2:25 PM rx prednisone but did not helped. Pt does feel dulera is working well.  Dyspnea is at baseline now.  Pt feels fans blowing on her at work are an issue Pt denies any significant sore throat, nasal congestion or excess secretions, fever, chills, sweats, unintended weight loss, pleurtic or exertional chest pain, orthopnea PND, or leg swelling Pt denies any increase in rescue therapy over baseline, denies waking up needing it or having any early am or nocturnal exacerbations of coughing/wheezing/or dyspnea.   Preventive Screening-Counseling & Management  Alcohol-Tobacco     Smoking Status: quit     Year Quit: 2006     Pack years: 22  Current Medications (verified): 1)  Dulera 200-5 Mcg/act Aero (Mometasone Furo-Formoterol Fum) .... 2 Puffs Twice Per Day 2)  Proventil Hfa 108 (90 Base) Mcg/act  Aers (Albuterol Sulfate) .... One To Two Puff Q4h As Needed 3)  Januvia 100 Mg  Tabs (Sitagliptin Phosphate) .... Qd 4)  Astelin 137 Mcg/spray  Soln (Azelastine Hcl) .... Two Puff Each Nostril Two Times A Day As Needed 5)  Freestyle Lite Test  Strp (Glucose Blood) .... Use As Directed 6)  Freestyle Lancets   Misc (Lancets) .... Use As Directed 7)  Vitamin D3 1000 Unit  Tabs (Cholecalciferol) .... 2 By Mouth Daily 8)  Hhn .... As Dirrected 9)  Duoneb 0.5-2.5 (3) Mg/75ml Soln (Ipratropium-Albuterol) .Marland Kitchen.. 1 Via Hhn Qid Prn 10)  Aleve 220 Mg Tabs (Naproxen Sodium) .... As Needed 11)  Nasonex 50 Mcg/act  Susp (Mometasone Furoate) .... Two Puffs Each Nostril Daily 12)  Actoplus  Met Xr 15-1000 Mg Xr24h-Tab (Pioglitazone Hcl-Metformin Hcl) .Marland Kitchen.. 1 By Mouth Bid 13)  Omeprazole 40 Mg Cpdr (Omeprazole) .Marland Kitchen.. 1 By Mouth Qam For Indigestion  Allergies (verified): 1)  Penicillin 2)  Augmentin (Amoxicillin-Pot Clavulanate)  Past History:  Past medical, surgical, family and social histories (including risk factors) reviewed, and no changes noted (except as noted below).  Past Medical History: Reviewed history from 06/14/2009 and no changes required. Hx of CALCIFIC TENDINITIS (ICD-727.82) * Hx of IDIOPATHIC THROMBOCYTOPENIA GASTROESOPHAGEAL REFLUX DISEASE (ICD-530.81) ADENOCARCINOMA, UTERUS, HX OF (ICD-V10.42) Hx of LATERAL EPICONDYLITIS (ICD-726.32) HIATAL HERNIA, HX OF (ICD-V12.79) BARRETTS ESOPHAGUS (ICD-530.85) COPD (ICD-496)   Diabetes, Type 2 new diagnosis 2/09 Depression  Past Surgical History: Reviewed history from 09/26/2007 and no changes required. TONSILLECTOMY AND ADENOIDECTOMY, HX OF (ICD-V45.79) HYSTERECTOMY, PARTIAL, HX OF (ICD-V45.77) SPLENECTOMY, HX OF (ICD-V45.79)     Family History: Reviewed history from 01/08/2008 and no changes required. sister with liver cancer and expired C V A / Stroke:   Social History: Reviewed history from 03/19/2010 and no changes required. 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  The patient denies shortness of breath at rest, productive cough, non-productive cough, coughing up blood, chest pain, irregular heartbeats, acid heartburn, indigestion, loss of appetite, weight change, abdominal pain, difficulty swallowing, sore throat, tooth/dental  problems, headaches, nasal congestion/difficulty breathing through nose, sneezing, itching, ear ache, anxiety, depression, hand/feet swelling, joint stiffness or pain, rash, change in color of mucus, and fever.    Vital Signs:  Patient  profile:   65 year old female Height:      63 inches Weight:      160.25 pounds BMI:     28.49 O2 Sat:      97 % on Room air Temp:     98.2 degrees F oral Pulse rate:   78 / minute BP sitting:   120 / 70  (right arm) Cuff size:   regular  Vitals Entered By: Gweneth Dimitri RN (July 27, 2010 2:07 PM)  O2 Flow:  Room air CC: 1 month follow up.  Pt states breathing is "so so."  Nonprod cough.  wheezing at times.  Denies chest tightness and SOB. Comments Medications reviewed with patient Daytime contact number verified with patient. Gweneth Dimitri RN  July 27, 2010 2:10 PM    Physical Exam  Additional Exam:  Gen. Pleasant, well-nourished, in no distress, normal affect ENT -mild nasal mucosal erythema and edema Neck: No JVD, no thyromegaly, no carotid bruits Lungs: no use of accessory muscles, no dullness to percussion, distant BS Cardiovascular: Rhythm regular, heart sounds  normal, no murmurs or gallops, no peripheral edema Abdomen: soft and non-tender, no hepatosplenomegaly, BS normal. Musculoskeletal: No deformities, no cyanosis or clubbing Neuro:  alert, non focal     Impression & Recommendations:  Problem # 1:  COPD (ICD-496) Assessment Unchanged copd stable plan  cont dulera  Complete Medication List: 1)  Dulera 200-5 Mcg/act Aero (Mometasone furo-formoterol fum) .... 2 puffs twice per day 2)  Proventil Hfa 108 (90 Base) Mcg/act Aers (Albuterol sulfate) .... One to two puff q4h as needed 3)  Januvia 100 Mg Tabs (Sitagliptin phosphate) .... Qd 4)  Astelin 137 Mcg/spray Soln (Azelastine hcl) .... Two puff each nostril two times a day as needed 5)  Freestyle Lite Test Strp (Glucose blood) .... Use as directed 6)  Freestyle Lancets Misc (Lancets) .... Use as directed 7)  Vitamin D3 1000 Unit Tabs (Cholecalciferol) .... 2 by mouth daily 8)  Hhn  .... As dirrected 9)  Duoneb 0.5-2.5 (3) Mg/62ml Soln (Ipratropium-albuterol) .Marland Kitchen.. 1 via hhn qid prn 10)  Aleve 220 Mg  Tabs (Naproxen sodium) .... As needed 11)  Nasonex 50 Mcg/act Susp (Mometasone furoate) .... Two puffs each nostril daily 12)  Actoplus Met Xr 15-1000 Mg Xr24h-tab (Pioglitazone hcl-metformin hcl) .Marland Kitchen.. 1 by mouth bid 13)  Omeprazole 40 Mg Cpdr (Omeprazole) .Marland Kitchen.. 1 by mouth qam for indigestion  Other Orders: Est. Patient Level III (84132)  Patient Instructions: 1)  No change in medications 2)  Return in      4    months   Immunization History:  Influenza Immunization History:    Influenza:  historical (07/06/2010)   Prevention & Chronic Care Immunizations   Influenza vaccine: Historical  (07/06/2010)    Tetanus booster: Not documented    Pneumococcal vaccine: Pneumovax  (06/14/2009)    H. zoster vaccine: Not documented  Colorectal Screening   Hemoccult: Not documented    Colonoscopy: Not documented  Other Screening   Pap smear: Not documented    Mammogram: Not documented    DXA bone density scan: Not documented   Smoking status: quit  (07/27/2010)  Diabetes Mellitus   HgbA1C: 8.9  (07/02/2010)    Eye exam: Not documented    Foot exam: Not documented  High risk foot: Not documented   Foot care education: Not documented    Urine microalbumin/creatinine ratio: Not documented  Lipids   Total Cholesterol: 206  (06/07/2009)   LDL: DEL  (01/01/2008)   LDL Direct: 157.4  (06/07/2009)   HDL: 40.10  (06/07/2009)   Triglycerides: 90.0  (06/07/2009)  Self-Management Support :    Diabetes self-management support: Not documented

## 2010-12-11 NOTE — Progress Notes (Signed)
Summary: OV SAT  Phone Note Call from Patient   Summary of Call: Pt c/o continued symptoms from being seen tuesday. Should she "let it run it's course?" or come back in for f/u office visit?  Initial call taken by: Lamar Sprinkles, CMA,  December 11, 2009 10:10 AM  Follow-up for Phone Call        No  need for ROV if better Follow-up by: Tresa Garter MD,  December 11, 2009 12:53 PM  Additional Follow-up for Phone Call Additional follow up Details #1::        Pt scheduled for office visit tomorrow, continues to feel bad Additional Follow-up by: Lamar Sprinkles, CMA,  December 11, 2009 2:10 PM

## 2010-12-11 NOTE — Assessment & Plan Note (Signed)
Summary: Pulmonary OV   Primary Provider/Referring Provider:  Sonda Primes  CC:  Follow up with PFTs.  Pt states she is having increase in coughing spells x 2 wks.  Cough is prod with clear mucus.  SOB with exertion and during the coughing spells.  Wheezing and chest tightness at times.  .  History of Present Illness: 65/f, ex smoker, with previous history of chronic obstructive lung disease, asthmatic bronchitic component.  The patient's last office visit was in feb 2009 (PW).    Mar 19, 2010 4:30 PM  Acute visit - c/o increased SOB worse when walking up stairs, wheezing, productive cough with clear mucus, starting Saturday when waking up. Has been on advair all this time & using nebs three times a day last 3 days CXR wnl  Apr 02, 2010--Presents for 2 week follow up COPD . Seen last visit w/ COPD flare -tx w/ steroid taper. CXR w/ chronic changes no acute findings. She is feeling better, cough and wheezing are less. Not quite back to baseline but improved. No fever or discolored mucus. Denies chest pain,  orthopnea, hemoptysis, fever, n/v/d, edema, headache.   June 26, 2010 3:31 PM Since 5/11. the pt notes she is   dyspneic  The pt still has clear mucus and a productive cough.  There is no chest pain. No f/c/s.  She denies Gerd symptoms.  She maintains Advair daily. She is not smoking.     Preventive Screening-Counseling & Management  Alcohol-Tobacco     Smoking Status: quit     Year Quit: 2006     Pack years: 23  Current Medications (verified): 1)  Advair Diskus 250-50 Mcg/dose  Misc (Fluticasone-Salmeterol) .... One Puff Two Times A Day 2)  Proventil Hfa 108 (90 Base) Mcg/act  Aers (Albuterol Sulfate) .... One To Two Puff Q4h As Needed 3)  Glucophage Xr 500 Mg  Tb24 (Metformin Hcl) .... 2 By Mouth Bid 4)  Januvia 100 Mg  Tabs (Sitagliptin Phosphate) .... Qd 5)  Astelin 137 Mcg/spray  Soln (Azelastine Hcl) .... Two Puff Each Nostril Two Times A Day As Needed 6)  Freestyle Lite  Test  Strp (Glucose Blood) .... Use As Directed 7)  Freestyle Lancets   Misc (Lancets) .... Use As Directed 8)  Vitamin D3 1000 Unit  Tabs (Cholecalciferol) .... 2 By Mouth Daily 9)  Hhn .... As Dirrected 10)  Duoneb 0.5-2.5 (3) Mg/52ml Soln (Ipratropium-Albuterol) .Marland Kitchen.. 1 Via Hhn Qid Prn 11)  Aleve 220 Mg Tabs (Naproxen Sodium) .... As Needed 12)  Omeprazole 20 Mg Cpdr (Omeprazole) .Marland Kitchen.. 1 By Mouth Qd  Allergies (verified): 1)  Penicillin 2)  Augmentin (Amoxicillin-Pot Clavulanate)  Past History:  Past medical, surgical, family and social histories (including risk factors) reviewed, and no changes noted (except as noted below).  Past Medical History: Reviewed history from 06/14/2009 and no changes required. Hx of CALCIFIC TENDINITIS (ICD-727.82) * Hx of IDIOPATHIC THROMBOCYTOPENIA GASTROESOPHAGEAL REFLUX DISEASE (ICD-530.81) ADENOCARCINOMA, UTERUS, HX OF (ICD-V10.42) Hx of LATERAL EPICONDYLITIS (ICD-726.32) HIATAL HERNIA, HX OF (ICD-V12.79) BARRETTS ESOPHAGUS (ICD-530.85) COPD (ICD-496)   Diabetes, Type 2 new diagnosis 2/09 Depression  Past Surgical History: Reviewed history from 09/26/2007 and no changes required. TONSILLECTOMY AND ADENOIDECTOMY, HX OF (ICD-V45.79) HYSTERECTOMY, PARTIAL, HX OF (ICD-V45.77) SPLENECTOMY, HX OF (ICD-V45.79)     Family History: Reviewed history from 01/08/2008 and no changes required. sister with liver cancer and expired C V A / Stroke:   Social History: Reviewed history from 03/19/2010 and no changes required. 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  The patient denies coughing up blood, chest pain, irregular heartbeats, acid heartburn, indigestion, loss of appetite, weight change, abdominal pain, difficulty swallowing, sore throat,  tooth/dental problems, headaches, nasal congestion/difficulty breathing through nose, sneezing, itching, ear ache, anxiety, depression, hand/feet swelling, joint stiffness or pain, rash, change in color of mucus, and fever.    Vital Signs:  Patient profile:   65 year old female Height:      63 inches Weight:      164 pounds BMI:     29.16 O2 Sat:      95 % on Room air Temp:     98.2 degrees F oral Pulse rate:   63 / minute BP sitting:   122 / 78  (left arm) Cuff size:   regular  Vitals Entered By: Gweneth Dimitri RN (June 26, 2010 3:13 PM)  O2 Flow:  Room air CC: Follow up with PFTs.  Pt states she is having increase in coughing spells x 2 wks.  Cough is prod with clear mucus.  SOB with exertion and during the coughing spells.  Wheezing and chest tightness at times.   Comments Medications reviewed with patient Daytime contact number verified with patient. Gweneth Dimitri RN  June 26, 2010 3:15 PM    Physical Exam  Additional Exam:  Gen. Pleasant, well-nourished, in no distress, normal affect ENT -mild nasal mucosal erythema and edema Neck: No JVD, no thyromegaly, no carotid bruits Lungs: no use of accessory muscles, no dullness to percussion,  exp wheezes and poor airflow Cardiovascular: Rhythm regular, heart sounds  normal, no murmurs or gallops, no peripheral edema Abdomen: soft and non-tender, no hepatosplenomegaly, BS normal. Musculoskeletal: No deformities, no cyanosis or clubbing Neuro:  alert, non focal     Impression & Recommendations:  Problem # 1:  C O P D WITH ACUTE EXACERBATION (ICD-491.21) Assessment Deteriorated acute copd exacerbation with ongoing lower airway inflammation plan repulse prednisone change advair to dulera two puff two times a day add nasonex daily  Orders: Est. Patient Level IV (81191)  Medications Added to Medication List This Visit: 1)  Dulera 200-5 Mcg/act Aero (Mometasone furo-formoterol fum) .... 2 puffs twice per day 2)   Astelin 137 Mcg/spray Soln (Azelastine hcl) .... Two puff each nostril two times a day as needed 3)  Prednisone 10 Mg Tabs (Prednisone) .... Take as directed 4 each am x 4 days, 3 x 4 days, 2 x 4 days, 1 x 4 days then stop 4)  Nasonex 50 Mcg/act Susp (Mometasone furoate) .... Two puffs each nostril daily  Complete Medication List: 1)  Dulera 200-5 Mcg/act Aero (Mometasone furo-formoterol fum) .... 2 puffs twice per day 2)  Proventil Hfa 108 (90 Base) Mcg/act Aers (Albuterol sulfate) .... One to two puff q4h as needed 3)  Glucophage Xr 500 Mg Tb24 (Metformin hcl) .... 2 by mouth bid 4)  Januvia 100 Mg Tabs (Sitagliptin phosphate) .... Qd 5)  Astelin 137 Mcg/spray Soln (Azelastine hcl) .... Two puff each nostril two times a day as needed 6)  Freestyle Lite Test Strp (Glucose blood) .... Use as directed 7)  Freestyle Lancets Misc (Lancets) .... Use as directed 8)  Vitamin D3 1000 Unit Tabs (Cholecalciferol) .... 2 by mouth daily 9)  Hhn  .... As dirrected 10)  Duoneb 0.5-2.5 (3) Mg/57ml Soln (Ipratropium-albuterol) .Marland Kitchen.. 1 via hhn qid prn 11)  Aleve 220 Mg Tabs (Naproxen sodium) .... As needed 12)  Omeprazole 20 Mg Cpdr (Omeprazole) .Marland Kitchen.. 1 by mouth qd 13)  Prednisone 10 Mg Tabs (Prednisone) .... Take as directed 4 each am x 4 days, 3 x 4 days, 2 x 4 days, 1 x 4 days then stop 14)  Nasonex 50 Mcg/act Susp (Mometasone furoate) .... Two puffs each nostril daily  Patient Instructions: 1)  Stop Advair 2)  Start Dulera two puff twice daily 3)  Start nasonex two puffs twice daily 4)  Prednisone 10mg  4 each am x 4 days, 3 x 4 days, 2 x 4 days, 1 x 4 days then stop 5)  Nebulizer as needed 6)  Follow reflux diet 7)  Return one month Prescriptions: NASONEX 50 MCG/ACT  SUSP (MOMETASONE FUROATE) Two puffs each nostril daily  #1 x 6   Entered and Authorized by:   Storm Frisk MD   Signed by:   Storm Frisk MD on 06/26/2010   Method used:   Electronically to        [facility]  [address] [phone]*  (retail)       [address]       [place], [place]  [address]       Ph: [phone] or [phone]       Fax: [phone]   RxID:   [id number] PREDNISONE 10 MG  TABS (PREDNISONE) Take as directed 4 each am x 4 days, 3 x 4 days, 2 x 4 days, 1 x 4 days then stop  #40 x 0   Entered and Authorized by:   Storm Frisk MD   Signed by:   Storm Frisk MD on 06/26/2010   Method used:   Electronically to        [facility]  [address] [phone]* (retail)       [address]       [place], [place]  [address]       Ph: [phone] or [phone]       Fax: [phone]   RxID:   [id number] DULERA 200-5 MCG/ACT AERO (MOMETASONE FURO-FORMOTEROL FUM) 2 puffs twice per day  #1 x 6   Entered and Authorized by:   Storm Frisk MD   Signed by:   Storm Frisk MD on 06/26/2010   Method used:   Electronically to        [facility]  [address] [phone]* (retail)       [address]       [place], [place]  [address]       Ph: [phone] or [phone]       Fax: [phone]   RxID:   [id number]   Appended Document: Pulmonary OV Note PFTs performed same day as OV and reveal moderate obstructive defect and further reduction in DLCO c/w emphyema

## 2010-12-11 NOTE — Miscellaneous (Signed)
Summary: [facility]  [facility]   Imported By: Lester Elmo 05/31/2010 08:24:56  _____________________________________________________________________  External Attachment:    Type:   Image     Comment:   External Document

## 2010-12-11 NOTE — Miscellaneous (Signed)
Summary: PFTs from 06/2010   Pulmonary Function Test Date: 06/26/2010 Height (in.): 63 Gender: Female  Pre-Spirometry FVC    Value: 1.69 L/min   Pred: 2.86 L/min     % Pred: 59 % FEV1    Value: 0.98 L     Pred: 2.07 L     % Pred: 47 % FEV1/FVC  Value: 58 %     Pred: 73 %    FEF 25-75  Value: 0.48 L/min   Pred: 2.41 L/min     % Pred: 20 %  Post-Spirometry FVC    Value: 1.93 L/min   Pred: 2.86 L/min     % Pred: 67 % FEV1    Value: 1.01 L     Pred: 2.07 L     % Pred: 49 % FEV1/FVC  Value: 52 %     Pred: 73 %    FEF 25-75  Value: 0.36 L/min   Pred: 2.41 L/min     % Pred: 15 %  Lung Volumes TLC    Value: 4.04 L   % Pred: 87 % RV    Value: 2.32 L   % Pred: 129 % DLCO    Value: 9.5 %   % Pred: 46 % DLCO/VA  Value: 4.72 %   % Pred: 130 %  Comments: Moderate obstructive defect Clinical Lists Changes  Observations: Added new observation of PFT COMMENTS: Moderate obstructive defect (06/27/2010 16:46) Added new observation of DLCO/VA%EXP: 130 % (06/27/2010 16:46) Added new observation of DLCO/VA: 4.72 % (06/27/2010 16:46) Added new observation of DLCO % EXPEC: 46 % (06/27/2010 16:46) Added new observation of DLCO: 9.5 % (06/27/2010 16:46) Added new observation of RV % EXPECT: 129 % (06/27/2010 16:46) Added new observation of RV: 2.32 L (06/27/2010 16:46) Added new observation of TLC % EXPECT: 87 % (06/27/2010 16:46) Added new observation of TLC: 4.04 L (06/27/2010 16:46) Added new observation of FEF2575%EXPS: 15 % (06/27/2010 16:46) Added new observation of PSTFEF25/75P: 2.41  (06/27/2010 16:46) Added new observation of PSTFEF25/75%: 0.36 L/min (06/27/2010 16:46) Added new observation of FEV1FVCPRDPS: 73 % (06/27/2010 16:46) Added new observation of PSTFEV1/FVC: 52 % (06/27/2010 16:46) Added new observation of POSTFEV1%PRD: 49 % (06/27/2010 16:46) Added new observation of FEV1PRDPST: 2.07 L (06/27/2010 16:46) Added new observation of POST FEV1: 1.01 L/min (06/27/2010 16:46) Added new  observation of POST FVC%EXP: 67 % (06/27/2010 16:46) Added new observation of FVCPRDPST: 2.86 L/min (06/27/2010 16:46) Added new observation of POST FVC: 1.93 L (06/27/2010 16:46) Added new observation of FEF % EXPEC: 20 % (06/27/2010 16:46) Added new observation of FEF25-75%PRE: 2.41 L/min (06/27/2010 16:46) Added new observation of FEF 25-75%: 0.48 L/min (06/27/2010 16:46) Added new observation of FEV1/FVC PRE: 73 % (06/27/2010 16:46) Added new observation of FEV1/FVC: 58 % (06/27/2010 16:46) Added new observation of FEV1 % EXP: 47 % (06/27/2010 16:46) Added new observation of FEV1 PREDICT: 2.07 L (06/27/2010 16:46) Added new observation of FEV1: 0.98 L (06/27/2010 16:46) Added new observation of FVC % EXPECT: 59 % (06/27/2010 16:46) Added new observation of FVC PREDICT: 2.86 L (06/27/2010 16:46) Added new observation of FVC: 1.69 L (06/27/2010 16:46) Added new observation of PFT HEIGHT: 63  (06/27/2010 16:46) Added new observation of PFT DATE: 06/26/2010  (06/27/2010 16:46)  Appended Document: PFTs from 06/2010 result noted  patient aware

## 2011-01-12 ENCOUNTER — Encounter: Payer: Self-pay | Admitting: Family Medicine

## 2011-01-12 ENCOUNTER — Ambulatory Visit (INDEPENDENT_AMBULATORY_CARE_PROVIDER_SITE_OTHER): Payer: 59 | Admitting: Family Medicine

## 2011-01-12 DIAGNOSIS — J441 Chronic obstructive pulmonary disease with (acute) exacerbation: Secondary | ICD-10-CM

## 2011-01-12 DIAGNOSIS — J0100 Acute maxillary sinusitis, unspecified: Secondary | ICD-10-CM

## 2011-01-12 DIAGNOSIS — J019 Acute sinusitis, unspecified: Secondary | ICD-10-CM

## 2011-01-12 HISTORY — DX: Acute maxillary sinusitis, unspecified: J01.00

## 2011-01-17 NOTE — Assessment & Plan Note (Signed)
Summary: Sinusitis, COPD   Vital Signs:  Patient profile:   65 year old female Height:      63 inches Weight:      164 pounds BMI:     29.16 O2 Sat:      95 % on Room air Temp:     98.1 degrees F oral Pulse rate:   89 / minute BP sitting:   116 / 70  (left arm) Cuff size:   regular  Vitals Entered By: Payton Spark CMA (January 12, 2011 10:56 AM)  O2 Flow:  Room air CC: chest congestion and cough x 1 + weeks   Primary Care Provider:  Trinna Post Plotnikov  CC:  chest congestion and cough x 1 + weeks.  History of Present Illness: chest congestion and cough x 1 + weeks. No fever.  Right sided sinus pressure and pain.  Hx of COPD. Uses Dulera regularly.  No productive cough. no change in sputum production.  No ear pain or pressure.  No GI sxs. former smoker. Took Alkeselter daytime and nightime meds - not helping.    Allergies: 1)  Penicillin 2)  Augmentin (Amoxicillin-Pot Clavulanate)  Family History: Reviewed history from 01/08/2008 and no changes required. sister with liver cancer and expired C V A / Stroke:   Social History: Reviewed history from 03/19/2010 and no changes required. 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 No apparent alopecia or balding. Eyes:  No corneal or conjunctival inflammation noted. EOMI. Perrla.  Ears:  External ear exam shows no significant lesions or deformities.  Otoscopic examination reveals clear canals, tympanic membranes are intact bilaterally without bulging, retraction, inflammation or discharge. Hearing is grossly normal bilaterally. Nose:  External nasal examination shows no deformity or inflammation.  Mouth:  Oral mucosa and oropharynx without lesions or exudates.  Teeth in good  repair. Neck:  No deformities, masses, or tenderness noted. Lungs:  Normal respiratory effort, chest expands symmetrically. Lungs are clear to auscultation, no crackles or wheezes. Heart:  Normal rate and regular rhythm. S1 and S2 normal without gallop, murmur, click, rub or other extra sounds. Pulses:  Radial 2+  Skin:  no rashes.   Cervical Nodes:  No lymphadenopathy noted Psych:  Cognition and judgment appear intact. Alert and cooperative with normal attention span and concentration. No apparent delusions, illusions, hallucinations   Impression & Recommendations:  Problem # 1:  SINUSITIS - ACUTE-NOS (ICD-461.9)  Her updated medication list for this problem includes:    Astelin 137 Mcg/spray Soln (Azelastine hcl) .Marland Kitchen..Marland Kitchen Two puff each nostril two times a day as needed    Nasonex 50 Mcg/act Susp (Mometasone furoate) .Marland Kitchen..Marland Kitchen Two puffs each nostril daily    Doxycycline Hyclate 100 Mg Caps (Doxycycline hyclate) .Marland Kitchen... Take 1 tablet by mouth two times a day for 10 days  Instructed on treatment. Call if symptoms persist or worsen.   Problem # 2:  C O P D WITH ACUTE EXACERBATION (ICD-491.21)  will treat with doxy and steroids. Increase nebs as below. Start neb 3 time a day for 3 days, 2 times a day for 3 days then one time a day for 3 days.  f/U in 3 weeks for recheck for COPD/.    Orders: Albuterol Sulfate Sol 1mg  unit dose (Z6109) Atrovent 1mg  (Neb) (U0454) Nebulizer Tx (09811)  Complete Medication List: 1)  Dulera 200-5 Mcg/act Aero (Mometasone furo-formoterol fum) .... 2 puffs twice per day 2)  Proventil Hfa 108 (90 Base) Mcg/act Aers (Albuterol sulfate) .... One to two puff q4h as needed 3)  Januvia 100 Mg Tabs (Sitagliptin phosphate) .... Qd 4)  Astelin 137 Mcg/spray Soln (Azelastine hcl) .... Two puff each nostril two times a day as needed 5)  Freestyle Lite Test Strp (Glucose blood) .... Use as directed 6)  Freestyle Lancets Misc (Lancets) .... Use as directed 7)  Vitamin D3  1000 Unit Tabs (Cholecalciferol) .... 2 by mouth daily 8)  Hhn  .... As dirrected 9)  Duoneb 0.5-2.5 (3) Mg/7ml Soln (Ipratropium-albuterol) .Marland Kitchen.. 1 via hhn qid prn 10)  Aleve 220 Mg Tabs (Naproxen sodium) .... As needed 11)  Nasonex 50 Mcg/act Susp (Mometasone furoate) .... Two puffs each nostril daily 12)  Actoplus Met Xr 15-1000 Mg Xr24h-tab (Pioglitazone hcl-metformin hcl) .Marland Kitchen.. 1 by mouth bid 13)  Omeprazole 40 Mg Cpdr (Omeprazole) .Marland Kitchen.. 1 by mouth qam for indigestion 14)  Doxycycline Hyclate 100 Mg Caps (Doxycycline hyclate) .... Take 1 tablet by mouth two times a day for 10 days 15)  Diflucan 150 Mg Tabs (Fluconazole) .... Take 1 tablet by mouth once a day 16)  Prednisone 10 Mg Tabs (Prednisone) .... 4 tabs a day for 3 days, 3 tabs a day for 3 days, 2 tabs a day for 3 days, 1 tab a day for 3 days.  Patient Instructions: 1)  Start neb 3 time a day for 3 days, 2 times a day for 3 days then one time a day for 3 days.  2)  Complete your antibiotic and steroid.  3)  Follow up in 3 weeks for recheck on your lungs.  Prescriptions: PREDNISONE 10 MG TABS (PREDNISONE) 4 tabs a day for 3 days, 3 tabs a day for 3 days, 2 tabs a day for 3 days, 1 tab a day for 3 days.  #30 x 0   Entered and Authorized by:   Nani Gasser MD   Signed by:   Nani Gasser MD on 01/12/2011   Method used:   Electronically to        [facility]  [address] [phone]* (retail)       [address]       [place], [place]  [address]       Ph: [phone] or [phone]       Fax: [phone]   RxID:   [id number] DIFLUCAN 150 MG TABS (FLUCONAZOLE) Take 1 tablet by mouth once a day  #1 x 0   Entered and Authorized by:   Nani Gasser MD   Signed by:   Nani Gasser MD on 01/12/2011   Method used:   Electronically to        [facility]  [address] [phone]* (retail)       [address]       [place], [place]  [address]       Ph: [phone] or [phone]       Fax: [phone]   RxID:    [id number] DOXYCYCLINE HYCLATE 100 MG CAPS (DOXYCYCLINE HYCLATE) Take 1 tablet by mouth two times a day for 10 days  #20 x 0   Entered and Authorized by:   Nani Gasser MD   Signed by:   Nani Gasser MD on 01/12/2011   Method used:  Electronically to        [facility]  [address] [phone]* (retail)       [address]       [place], [place]  [address]       Ph: [phone] or [phone]       Fax: [phone]   RxID:   [id number]    Medication Administration  Medication # 1:    Medication: Albuterol Sulfate Sol 1mg  unit dose    Diagnosis: C O P D WITH ACUTE EXACERBATION (ICD-491.21)    Route: inhaled    Patient tolerated medication without complications    Given by: Payton Spark CMA (January 12, 2011 11:57 AM)  Medication # 2:    Medication: Atrovent 1mg  (Neb)    Diagnosis: C O P D WITH ACUTE EXACERBATION (ICD-491.21)    Route: inhaled    Patient tolerated medication without complications    Given by: Payton Spark CMA (January 12, 2011 11:58 AM)  Orders Added: 1)  Est. Patient Level IV [60630] 2)  Albuterol Sulfate Sol 1mg  unit dose [J7613] 3)  Atrovent 1mg  (Neb) [Z6010] 4)  Nebulizer Tx [93235]     Medication Administration  Medication # 1:    Medication: Albuterol Sulfate Sol 1mg  unit dose    Diagnosis: C O P D WITH ACUTE EXACERBATION (ICD-491.21)    Route: inhaled    Patient tolerated medication without complications    Given by: Payton Spark CMA (January 12, 2011 11:57 AM)  Medication # 2:    Medication: Atrovent 1mg  (Neb)    Diagnosis: C O P D WITH ACUTE EXACERBATION (ICD-491.21)    Route: inhaled    Patient tolerated medication without complications    Given by: Payton Spark CMA (January 12, 2011 11:58 AM)  Orders Added: 1)  Est. Patient Level IV [57322] 2)  Albuterol Sulfate Sol 1mg  unit dose [J7613] 3)  Atrovent 1mg  (Neb) [G2542] 4)  Nebulizer Tx [70623]

## 2011-02-19 ENCOUNTER — Inpatient Hospital Stay (HOSPITAL_COMMUNITY)
Admission: EM | Admit: 2011-02-19 | Discharge: 2011-02-21 | DRG: 690 | Disposition: A | Discharge status: Home / Self Care | Payer: 59 | Attending: Internal Medicine | Admitting: Internal Medicine

## 2011-02-19 ENCOUNTER — Emergency Department (HOSPITAL_COMMUNITY): Payer: 59

## 2011-02-19 DIAGNOSIS — J449 Chronic obstructive pulmonary disease, unspecified: Secondary | ICD-10-CM | POA: Diagnosis present

## 2011-02-19 DIAGNOSIS — K219 Gastro-esophageal reflux disease without esophagitis: Secondary | ICD-10-CM | POA: Diagnosis present

## 2011-02-19 DIAGNOSIS — J4489 Other specified chronic obstructive pulmonary disease: Secondary | ICD-10-CM | POA: Diagnosis present

## 2011-02-19 DIAGNOSIS — Z880 Allergy status to penicillin: Secondary | ICD-10-CM

## 2011-02-19 DIAGNOSIS — IMO0001 Reserved for inherently not codable concepts without codable children: Secondary | ICD-10-CM | POA: Diagnosis present

## 2011-02-19 DIAGNOSIS — E871 Hypo-osmolality and hyponatremia: Secondary | ICD-10-CM | POA: Diagnosis present

## 2011-02-19 DIAGNOSIS — N10 Acute tubulo-interstitial nephritis: Principal | ICD-10-CM | POA: Diagnosis present

## 2011-02-19 LAB — URINALYSIS, ROUTINE W REFLEX MICROSCOPIC
Nitrite: NEGATIVE
Urobilinogen, UA: 0.2 mg/dL (ref 0.0–1.0)

## 2011-02-19 LAB — CBC
MCH: 30.5 pg (ref 26.0–34.0)
MCHC: 32.7 g/dL (ref 30.0–36.0)
MCV: 93.2 fL (ref 78.0–100.0)
RBC: 4.56 MIL/uL (ref 3.87–5.11)
WBC: 18.4 10*3/uL — ABNORMAL HIGH (ref 4.0–10.5)

## 2011-02-19 LAB — URINE MICROSCOPIC-ADD ON

## 2011-02-19 LAB — DIFFERENTIAL
Eosinophils Absolute: 0.1 10*3/uL (ref 0.0–0.7)
Eosinophils Relative: 0 % (ref 0–5)
Neutro Abs: 12.5 10*3/uL — ABNORMAL HIGH (ref 1.7–7.7)

## 2011-02-19 LAB — COMPREHENSIVE METABOLIC PANEL
Albumin: 3.4 g/dL — ABNORMAL LOW (ref 3.5–5.2)
Alkaline Phosphatase: 67 U/L (ref 39–117)
CO2: 22 mEq/L (ref 19–32)
GFR calc Af Amer: >60 mL/min (ref >60)
Potassium: 4.1 mEq/L (ref 3.5–5.1)
Sodium: 130 mEq/L — ABNORMAL LOW (ref 135–145)
Total Bilirubin: 0.3 mg/dL (ref 0.3–1.2)
Total Protein: 7.5 g/dL (ref 6.0–8.3)

## 2011-02-19 LAB — LIPASE, BLOOD: Lipase: 23 U/L (ref 11–59)

## 2011-02-19 MED ORDER — IOHEXOL 300 MG/ML  SOLN
100.0000 mL | Freq: Once | INTRAMUSCULAR | Status: AC | PRN
  Administered 2011-02-19: 100 mL via INTRAVENOUS

## 2011-02-20 LAB — DIFFERENTIAL
Basophils Relative: 0 % (ref 0–1)
Eosinophils Relative: 1 % (ref 0–5)
Monocytes Relative: 11 % (ref 3–12)

## 2011-02-20 LAB — TSH: TSH: 2.286 u[IU]/mL (ref 0.350–4.500)

## 2011-02-20 LAB — URINALYSIS, MICROSCOPIC ONLY
Bilirubin Urine: NEGATIVE
Glucose, UA: NEGATIVE mg/dL
Ketones, ur: NEGATIVE mg/dL
Protein, ur: NEGATIVE mg/dL
Urobilinogen, UA: 0.2 mg/dL (ref 0.0–1.0)

## 2011-02-20 LAB — LIPID PANEL
HDL: 36 mg/dL — ABNORMAL LOW (ref >39)
Total CHOL/HDL Ratio: 5.6 RATIO
VLDL: 28 mg/dL (ref 0–40)

## 2011-02-20 LAB — CBC
MCH: 30.2 pg (ref 26.0–34.0)
MCV: 93.3 fL (ref 78.0–100.0)
RBC: 4.01 MIL/uL (ref 3.87–5.11)
WBC: 13.8 10*3/uL — ABNORMAL HIGH (ref 4.0–10.5)

## 2011-02-20 LAB — GLUCOSE, CAPILLARY: Glucose-Capillary: 174 mg/dL — ABNORMAL HIGH (ref 70–99)

## 2011-02-20 LAB — HEMOGLOBIN A1C: Hgb A1c MFr Bld: 8.4 % — ABNORMAL HIGH (ref <5.7)

## 2011-02-21 LAB — BASIC METABOLIC PANEL
BUN: 9 mg/dL (ref 6–23)
Chloride: 105 mEq/L (ref 96–112)
GFR calc non Af Amer: >60 mL/min (ref >60)
Glucose, Bld: 227 mg/dL — ABNORMAL HIGH (ref 70–99)
Potassium: 4.4 mEq/L (ref 3.5–5.1)
Sodium: 134 mEq/L — ABNORMAL LOW (ref 135–145)

## 2011-02-21 LAB — GLUCOSE, CAPILLARY: Glucose-Capillary: 186 mg/dL — ABNORMAL HIGH (ref 70–99)

## 2011-02-21 LAB — URINE CULTURE
Culture: NO GROWTH
Special Requests: NEGATIVE

## 2011-02-21 LAB — CBC
HCT: 37.1 % (ref 36.0–46.0)
Hemoglobin: 11.8 g/dL — ABNORMAL LOW (ref 12.0–15.0)
MCV: 93.5 fL (ref 78.0–100.0)
RBC: 3.97 MIL/uL (ref 3.87–5.11)
WBC: 9.8 10*3/uL (ref 4.0–10.5)

## 2011-02-21 NOTE — H&P (Signed)
Pam Gamble, Pam Gamble                 ACCOUNT NO.:  [id number]  MEDICAL RECORD NO.:  [id number]           PATIENT TYPE:  E  LOCATION:  [facility]                         FACILITY:  [facility]  PHYSICIAN:  Homero Fellers, MD   DATE OF BIRTH:  [birth year]  DATE OF ADMISSION:  02/19/2011 DATE OF DISCHARGE:                             HISTORY & PHYSICAL   PRIMARY CARE PHYSICIAN:  Unassigned.  CHIEF COMPLAINT:  Abdominal pain and flank pain.  HISTORY OF PRESENT ILLNESS:  This is a 65 year old woman who presented with abdominal pain which has been going on for the past 3-4 days but got worse today accompanied by fever, nausea, and vomiting.  In the emergency room, her pain was about 10 in severity, but that has improved with pain medicine.  She also complained of poor appetite, weakness, but no dysuria, hematuria, or increased urinary frequency.  The patient was evaluated in the emergency room and found to have urinary tract infection, leukocytosis, and evidence of pyelonephritis on the left side on CAT scan of the abdomen.  She was also brought in hypotensive in the ED which responded to IV fluids.  She has prior history of cholelithiasis diagnosed by ultrasound years ago but has not had any attack of biliary colic.  PAST MEDICAL HISTORY:  COPD, diabetes mellitus, history of gallstones. No history of high blood pressure or heart attacks.  MEDICATION:  Januvia, metformin, Proventil, and Prilosec.  ALLERGIES:  To PENICILLIN.  SOCIAL HISTORY:  No smoking or drug use.  She drinks occasionally.  REVIEW OF SYSTEMS:  Ten point review of systems is negative except as above.  FAMILY HISTORY:  Noncontributory.  She lives alone.  PHYSICAL EXAMINATION:  VITAL SIGNS:  Blood pressure 117/45, pulse 72- 120, temperature 101.3, respirations 18.  O2 sat is 98%.  GENERAL:  She appeared comfortable in no distress.  Mouth is moist. NECK:  Supple.  No JVD, adenopathy, or thyromegaly. LUNGS:  Clear to  auscultation bilaterally. HEART:  S1 and S2.  No murmurs, rubs, or gallops. ABDOMEN:  Slightly protuberant, vague tenderness in the mid abdominal region and the right flank region.  Bowel sounds are present.  No masses. EXTREMITIES:  No edema, clubbing, or cyanosis. NEUROLOGICAL:  No focal deficits. SKIN:  No rash or lesion.  LABORATORY:  Urinalysis suggests urinary tract infection.  White count in blood is 18,000 with no significant left shift, hemoglobin 13.9, platelet count is 365.  Chemistry:  Sodium is 130, potassium 4.1, BUN 14, creatinine 0.76.  Liver enzymes are normal.  CT of the abdomen again showed evidence suggest focal pyelonephritis on the left side.  There are multiple stones in the gallbladder with no biliary dilatation.  ASSESSMENT: 1. Pyelonephritis. 2. Leukocytosis likely secondary to pyelonephritis. 3. Hyponatremia. 4. Uncontrolled diabetes mellitus.  PLAN:  Admit to telemetry.  Check blood cultures x2, urine culture, and TSH.  Check hemoglobin A1c tomorrow.  Optimize blood glucose control.  I will hold metformin in view of recent contrasted CAT scan study.  Gave small dose of Lantus and insulin sliding scale.  Her condition is fairly stable.  Hyponatremia should be  watched.  We just gave her a saline infusion and checked her TSH.  DVT prophylaxis should be provided.     Homero Fellers, MD     FA/MEDQ  D:  02/19/2011  T:  02/20/2011  Job:  [id number]  Electronically Signed by Homero Fellers  on 02/21/2011 09:30:11 AM

## 2011-02-22 NOTE — Discharge Summary (Signed)
Pam Gamble, Pam Gamble                 ACCOUNT NO.:  [id number]  MEDICAL RECORD NO.:  [id number]           PATIENT TYPE:  I  LOCATION:  6743                         FACILITY:  [facility]  PHYSICIAN:  Isidor Holts, M.D.  DATE OF BIRTH:  Feb 20, 1946  DATE OF ADMISSION:  02/19/2011 DATE OF DISCHARGE:  02/21/2011                              DISCHARGE SUMMARY   PRIMARY MEDICAL DOCTOR:  Georgina Quint. Plotnikov, MD  DISCHARGE DIAGNOSES: 1. Acute left pyelonephritis. 2. Vomiting, dehydration/mild hyponatremia secondary to acute left     pyelonephritis. 3. Chronic obstructive pulmonary disease. 4. History of cholelithiasis. 5. Type 2 diabetes mellitus.  DISCHARGE MEDICATIONS: 1. Ciprofloxacin 500 mg p.o. b.i.d. for 7 days. 2. Ultram 50 mg p.o. p.r.n. q.8. hourly for pain. A total of 15 pills     have been dispensed. 3. Aleve OTC 2 tablets p.o. p.r.n. q.12 hourly for headache and mild     pain. 4. Dulera (200 mcg) two puffs b.i.d. 5. Gingko biloba OTC 1 tablet p.o. daily. 6. Januvia 100 mg p.o. daily. 7. Metformin XR 1000 mg p.o. b.i.d. 8. Proventil inhaler (90 mcg) two puffs p.r.n. q.i.d. for shortness of     breath. 9. Stress Tab multivitamin OTC 1 tablet p.o. daily. 10.Vitamin D3 - 1000 units p.o. daily.  PROCEDURE:  Abdominal/pelvic CT scan, February 19, 2011.  This showed focal area of diminished enhancement involving the anterior cortex of the upper pole of the left kidney.  In the acute setting, this likely represents focal pyelonephritis/lobar nephronia, no abscess was identified.  There was high attenuation areas within the liver which may represent a combination of flash filling hemangioma and fatty sparing.  CONSULTATIONS:  None.  ADMISSION HISTORY:  As in H and P notes of February 19, 2011, dictated by Dr. Homero Fellers.  However, in brief, this is a 65 year old female, with known history of COPD, type 2 diabetes mellitus, cholelithiasis, presenting with at least 4 days  history of abdominal pain, associated with nausea and vomiting.  On initial evaluation, she was found to have positive urinary sediment, consistent with urinary tract infection.  She also had a peripheral blood leukocytosis.  Abdominal CT scan demonstrated a focal pyelonephritis of left kidney.  She was therefore, admitted for further evaluation, investigation, and management.  CLINICAL COURSE: 1. Acute left pyelonephritis.  The patient presented as described     above.  Temperature was 101.3.  She was managed with intravenous     fluids, intravenous Levaquin.  Blood cultures and urine cultures     were sent off.  The patient's clinical condition improved over the     course of next few days, with defervescence, improvement in     appetite, diminution of leukocytosis.  As a matter of fact on February 21, 2011, white cell count had normalized at 9.8.  Blood cultures     and urine cultures showed no growth at the time of this dictation;     however, the patient felt considerably better and as of February 21, 2011, was very keen to be discharged.  2. COPD.  The patient remained stable and asymptomatic from this     viewpoint.  3. History of cholelithiasis.  There were no problems referable to     this.  4. Type 2 diabetes mellitus.  The patient's glucose levels were     reasonably controlled with combination of scheduled Lantus insulin     and sliding-scale insulin coverage as well as carbohydrate-modified     diet.  We had to keep her metformin on hold, because of abdominal CT     scan with contrast done on February 19, 2011.  This has been     reinstated as of February 21, 2011.  5. Vomiting, dehydration, and mild hyponatremia.  The patient     presented with a BUN of 14, creatinine 0.76, sodium level was 130.     This was deemed secondary to the patient's acute pyelonephritis     which was managed with intravenous fluids, and as of February 21, 2011,     BUN had normalized at 9 with a  creatinine of 0.57, and she no     longer had episodes of nausea, vomiting.  Sodium level was found     more reasonable at 134.  DISPOSITION:  The patient was asymptomatic as of February 21, 2011, apart from mild left costovertebral angle tenderness.  She was deemed clinically stable for discharge and therefore, discharged accordingly.  ACTIVITY:  As tolerated.  Recommended to increase activity slowly.  DIET:  Carbohydrate modified.  FOLLOWUP INSTRUCTIONS:  The patient is to follow up with her primary MD, Dr. Jacinta Shoe, in the coming week.  She is instructed to call for an appointment and has verbalized understanding.  SPECIAL INSTRUCTIONS:  The patient is recommended to resume regular duties on February 25, 2011.     Isidor Holts, M.D.     CO/MEDQ  D:  02/21/2011  T:  02/22/2011  Job:  [id number]  cc:   Georgina Quint. Plotnikov, MD  Electronically Signed by Isidor Holts M.D. on 02/22/2011 04:27:47 PM

## 2011-02-25 DIAGNOSIS — Z0279 Encounter for issue of other medical certificate: Secondary | ICD-10-CM

## 2011-02-26 ENCOUNTER — Ambulatory Visit (INDEPENDENT_AMBULATORY_CARE_PROVIDER_SITE_OTHER): Payer: 59 | Admitting: Internal Medicine

## 2011-02-26 DIAGNOSIS — N10 Acute tubulo-interstitial nephritis: Secondary | ICD-10-CM | POA: Insufficient documentation

## 2011-02-26 DIAGNOSIS — J441 Chronic obstructive pulmonary disease with (acute) exacerbation: Secondary | ICD-10-CM

## 2011-02-26 DIAGNOSIS — E119 Type 2 diabetes mellitus without complications: Secondary | ICD-10-CM

## 2011-02-26 DIAGNOSIS — J309 Allergic rhinitis, unspecified: Secondary | ICD-10-CM

## 2011-02-26 HISTORY — DX: Acute pyelonephritis: N10

## 2011-02-26 LAB — CULTURE, BLOOD (ROUTINE X 2)
Culture  Setup Time: 201204111017
Culture: NO GROWTH

## 2011-02-26 MED ORDER — CIPROFLOXACIN HCL 500 MG PO TABS: 500.0000 mg | ORAL_TABLET | Freq: Two times a day (BID) | ORAL | Status: AC

## 2011-02-26 MED ORDER — SITAGLIPTIN PHOS-METFORMIN HCL 50-1000 MG PO TABS: 1.0000 | ORAL_TABLET | Freq: Two times a day (BID) | ORAL | Status: DC

## 2011-02-26 NOTE — Assessment & Plan Note (Signed)
On rx 

## 2011-02-26 NOTE — Assessment & Plan Note (Signed)
On Rx 

## 2011-02-26 NOTE — Assessment & Plan Note (Signed)
Finish Cpro. Hosp. D/c and CT were reviewed

## 2011-02-26 NOTE — Progress Notes (Signed)
  Subjective:    Patient ID: Pam Gamble, female    DOB: [birth year], 65 y.o.   MRN: [id number]  HPI  Post-hospital f/u due to UTI - L pyelo and dehydration. On Cipro now.  Review of Systems  Constitutional: Positive for fatigue. Negative for fever and chills.  HENT: Negative for tinnitus.   Eyes: Negative for pain.  Respiratory: Negative for shortness of breath.   Cardiovascular: Negative for leg swelling.  Genitourinary: Negative for urgency.  Musculoskeletal: Negative for back pain and joint swelling.  Neurological: Negative for weakness and headaches.  Psychiatric/Behavioral: Negative for decreased concentration.       Objective:   Physical Exam  Constitutional: She appears well-developed and well-nourished. No distress.  HENT:  Head: Normocephalic.  Right Ear: External ear normal.  Left Ear: External ear normal.  Nose: Nose normal.  Mouth/Throat: Oropharynx is clear and moist.  Eyes: Conjunctivae are normal. Pupils are equal, round, and reactive to light. Right eye exhibits no discharge. Left eye exhibits no discharge.  Neck: Normal range of motion. Neck supple. No JVD present. No tracheal deviation present. No thyromegaly present.  Cardiovascular: Normal rate, regular rhythm and normal heart sounds.   Pulmonary/Chest: No stridor. No respiratory distress. She has no wheezes.  Abdominal: Soft. Bowel sounds are normal. She exhibits no distension and no mass. There is no tenderness. There is no rebound and no guarding.  Musculoskeletal: She exhibits no edema and no tenderness.  Lymphadenopathy:    She has no cervical adenopathy.  Neurological: She displays normal reflexes. No cranial nerve deficit. She exhibits normal muscle tone. Coordination normal.  Skin: No rash noted. No erythema.  Psychiatric: She has a normal mood and affect. Her behavior is normal. Judgment and thought content normal.          Assessment & Plan:  Pyelonephritis, acute Finish Cpro. Hosp. D/c  and CT were reviewed  DIABETES, TYPE 2 On rx  C O P D WITH ACUTE EXACERBATION On Rx  ALLERGIC RHINITIS On rx    A form, FMLA, was filled out prior and handed to Ms Dilday.

## 2011-02-28 ENCOUNTER — Encounter: Payer: Self-pay | Admitting: Internal Medicine

## 2011-03-04 ENCOUNTER — Other Ambulatory Visit: Payer: Self-pay | Admitting: Internal Medicine

## 2011-03-04 NOTE — Telephone Encounter (Signed)
OK to fill this prescription with additional refills x3 Thank you!  

## 2011-03-04 NOTE — Telephone Encounter (Signed)
OK to fill both of this prescription with additional refills x11 Thank you!

## 2011-03-29 NOTE — Assessment & Plan Note (Signed)
[facility]                             PULMONARY OFFICE NOTE   Pam Gamble, Pam Gamble                        MRN:          [id number]  DATE:03/06/2007                            DOB:          [birth year]    Miss Asbill is a 66 year old white female history of chronic obstructive  lung disease primary emphysematous component. She was placed back on  Spiriva in February when she had an acute exacerbation of her lung  disease. She is also on;  1. Advair 250/50, one spray b.i.d.  2. Nexium 40 mg daily.   On exam, temperature 97, blood pressure 106/72, pulse 71, saturation was  96% on room air.  CHEST: Showed distant breath sounds with prolonged expiratory phase. No  wheeze or rhonchi was noted.  CARDIAC EXAM: Showed a regular rate and rhythm without S3, normal S1,  S2.  ABDOMEN: Soft, nontender.  EXTREMITIES: Showed no edema or clubbing.  SKIN: Clear.   IMPRESSION:  On this patient is that of chronic obstructive lung disease  with asthmatic bronchitic component.   PLAN:  Discontinue further Spiriva and maintain on Advair 250/50 one  spray b.i.d. and we will see the patient back in follow up.     Charlcie Cradle Delford Field, MD, [facility]  Electronically Signed    PEW/MedQ  DD: 03/06/2007  DT: 03/06/2007  Job #: [id number]   cc:   Georgina Quint. Plotnikov, MD

## 2011-03-29 NOTE — H&P (Signed)
NAME:  Pam Gamble, Pam Gamble                 ACCOUNT NO.:  [id number]   MEDICAL RECORD NO.:  [id number]          PATIENT TYPE:  INP   LOCATION:  5727                         FACILITY:  [facility]   PHYSICIAN:  Wilhemina Bonito. Marina Goodell, M.D. [facility] OF BIRTH:  08/06/1946   DATE OF ADMISSION:  12/10/2005  DATE OF DISCHARGE:                                HISTORY & PHYSICAL   CHIEF COMPLAINT:  Acute abdominal pain and diarrhea.   HISTORY:  Pam Gamble is a 65 year old white female, a primary patient of Dr.  Posey Rea, also known to Dr. Victorino Dike,  who is referred to ER through the  ER today per Dr. Andi Devon at [facility]. She does have a history of chronic  reflux, Barrett's esophagus, COPD, and is status post previous splenectomy  secondary to ITP. The patient was well apparently until the morning of  admission when she developed lower abdominal pain, followed by nausea and  diarrhea. Her pain was fairly significant, right lower quadrant much greater  than left lower quadrant. She had no associated vomiting. No melena or  hematochezia.  No chest pain, shortness of breath, back pain, etc. She has  had a low-grade temperature in the 99.5 range and has been having chills.  She has gone on to work, but then went to [employer] after leaving work  later in the day. She was noted to have a WBC of 11.2, felt to have a  significantly tender abdomen and therefore sent to [facility]  for CT scan of the abdomen and pelvis which has shown an inflammatory  process in the right lower quadrant involving the cecum and the ascending  colon. There is no abscess noted. She did have gallstones and evidence of  prior splenectomy. At this time she is seen and evaluated in the emergency  room by Dr. Yancey Flemings, found to be hemodynamically stable, tender in her  lower abdomen, right greater than left, and admitted to the hospital for  supportive management and IV antibiotics with acute infectious colitis  versus ischemic colitis  versus diverticulitis.   CURRENT MEDICATIONS:  1.  Nexium 40 mg daily.  2.  Wellbutrin (she is uncertain of the dosage).  3.  Albuterol p.r.n.   ALLERGIES:  No known drug allergies, but is intolerant to AMOXICILLIN.   PAST MEDICAL HISTORY:  Pertinent for GERD, Barrett's esophagus, COPD, ITP,  status post splenectomy, status post hysterectomy, tonsillectomy, and  adenoidectomy.   SOCIAL HISTORY:  The patient is divorced, lives with and cares for her  mother. She does not have any children. She works in a mill. She is a  smoker, having quit in July 2006 and then smoked for a while at the present  time, trying to quit. Occasional ETOH.   FAMILY HISTORY:  Negative for GI disease.   REVIEW OF SYSTEMS:  As outlined above.   PHYSICAL EXAMINATION:  GENERAL: Well-developed, white female in no acute  distress.  VITAL SIGNS: Blood pressure 111/63, temperature 97.5, pulse in the 80s,  saturation 97% on room air.  HEENT: Normocephalic and atraumatic. EOMI. PERRLA. Sclerae anicteric. Mucosa  moist.  CARDIOVASCULAR: Regular rate and rhythm with S1 and S2.  PULMONARY: Clear to A&P.  ABDOMEN: Soft. She is tender in the right lower quadrant. There is no  rebound, mass, or hepatosplenomegaly. Bowel sounds are present.  EXTREMITIES: Without clubbing, cyanosis, or edema.  RECTAL EXAM: Not done at this time.   Urinalysis is negative. The remainder of labs are pending.   IMPRESSION:  67.  A 65 year old white female with acute abdominal pain and diarrhea with      inflammatory process involving the cecum and ascending colon on CT scan.      Rule out infectious colitis, ischemic colitis, or possible      diverticulitis, though feel this is less likely.  2.  Chronic obstructive pulmonary disease.  3.  Smoker.  4.  Chronic gastroesophageal reflux disease and history of Barrett's      esophagus.  5.  Status post splenectomy for presumed idiopathic thrombocytopenic      purpura.  6.  Status post  hysterectomy.   PLAN:  The patient is admitted to the service of Dr. Yancey Flemings. She will be  covered with IV ciprofloxacin and IV Flagyl, pain control, and bowel rest.  Will obtain stool studies and will need to get the final report of her CT  scan as well as her prior colonoscopy reports from our office.      Amy Esterwood, P.A.-C. [facility]    ______________________________  Wilhemina Bonito. Marina Goodell, M.D. [facility]    AE/MEDQ  D:  12/11/2005  T:  12/11/2005  Job:  [id number]   cc:   Wilhemina Bonito. Marina Goodell, M.D. [facility]  [address]  [place]  [place] [address]   Georgina Quint. Plotnikov, M.D. [facility]  [address]  [place]  [place] [address]   Gabriel Earing, M.D.  Fax: [phone]

## 2011-03-29 NOTE — H&P (Signed)
[place]  Patient:    Pam Gamble, Pam Gamble Visit Number: [id number] MRN: [id number]          Service Type: Attending:  Justine Null, M.D. [facility] Dictated by:   Justine Null, M.D. [facility] Adm. Date:  08/13/01                           History and Physical  REASON FOR ADMISSION: Abdominal pain.  HISTORY OF PRESENT ILLNESS: The patient is a 65 year old woman, with five days of severe generalized pain in the abdomen.  The pain is of burning quality. There is associated nausea and vomiting.  She was given several medications to treat her symptoms in the emergency department without any relief.  PAST MEDICAL HISTORY: Cigarette smoker.  ALLERGIES: No drug allergies.  MEDICATIONS: None.  SOCIAL HISTORY: Alcohol, occasionally only.  The patient is single.  She is here with her mother.  She works Chemical engineer.  FAMILY HISTORY: No one else at home is ill.  REVIEW OF SYSTEMS: Denies the following: Diarrhea, hematuria, fever, bright red blood per rectum, constipation, melena, cough, shortness of breath, chills, dysuria, and chest pain.  PHYSICAL EXAMINATION:  VITAL SIGNS: Blood pressure is 123/51, heart rate is 64, respiratory rate is 22, temperature is 97.9 degrees.  GENERAL: No distress.  SKIN: Not diaphoretic.  NODES: No palpable lymphadenopathy.  HEENT: Head atraumatic.  Sclerae nonicteric.  Pharynx clear.  NECK: Supple.  CHEST: Clear to auscultation.  CARDIOVASCULAR: No JVD, no edema.  Regular rate and rhythm.  No murmur.  ABDOMEN: Soft.  Minimal diffuse tenderness.  No hepatosplenomegaly.  No mass. No guarding, no rebound tenderness.  RECTAL: Examination not done at this time due to patient discomfort, and patient is not anemic at this time.  EXTREMITIES: No obvious deformities of joints.  NEUROLOGIC: Alert, well oriented.  Moves all fours.  Cranial nerves grossly intact.  Sensation intact to touch in feet.  LABORATORY  DATA: Abdominal ultrasound shows hepatic steatosis.  Abdominal CT normal.  WBC 16,600.  Amylase is normal.  Hepatic transaminases are normal.  IMPRESSION: Symptom complex of uncertain etiology.  Nausea, vomiting, abdominal pain, leukocytosis of uncertain etiology.  PLAN:  1. Blood cultures.  2. Antibiotics.  3. Symptomatic therapy.  4. Repeat laboratory studies in a.m. Dictated by:   Justine Null, M.D. [facility] Attending:  Justine Null, M.D. [facility] DD:  08/13/01 TD:  08/14/01 Job: [id number]

## 2011-03-29 NOTE — Assessment & Plan Note (Signed)
HEALTHCARE                               PULMONARY OFFICE NOTE   FREDONIA, CASALINO                        MRN:          [id number]  DATE:08/12/2006                            DOB:          12-24-1945    CHIEF COMPLAINT:  Evaluate dyspnea.   HISTORY OF PRESENT ILLNESS:  This 65 year old white female has had dyspnea  for a year, getting progressively worse over the last several months. She is  short of breath walking uphill and going up steps. She is not short of  breath at rest. She has some productive cough of thin white mucus. There has  been no chest pain. She has been placed on Foradil one spray b.i.d., Spiriva  daily, and Proventil p.r.n., and while on this she has had stabilization in  her symptoms. She does having ongoing heartburn, some loss of appetite, no  change in weight. She does note some difficulty swallowing and soreness in  her throat, occasional nasal congestion, and mild hand and foot swelling.  She smoked a pack a day for 44 years, quit smoking in 2006. She is referred  now for further evaluation.   PAST MEDICAL HISTORY:  No history of hypertension, heart disease, angina,  heart failure, heart dysrhythmias, no history of asthma, emphysema, diabetes  or increased cholesterol. She does have cervical cancer diagnosed in 1980.   OPERATIVE HISTORY:  Includes hysterectomy in 1980, appendectomy in 1980, and  splenectomy in 1999.   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   FAMILY HISTORY:  Positive for emphysema in her father, allergies in father,  asthma in father. Cancer in maternal aunts and uncles.   REVIEW OF SYSTEMS:  Non-contributory.   CURRENT MEDICATIONS:  1. Nexium 40 mg daily.  2. Foradil one spray b.i.d.  3. Spiriva daily.   PHYSICAL EXAMINATION:  Temperature 98, blood pressure 110/70, pulse 74,  saturation 97% on room air.  CHEST: Distant breath sounds with prolonged expiratory phase. No wheeze or  rhonchi noted. There are expiratory wheezes with forced exhalation, poor air  flow noted.  CARDIAC: Regular rate and rhythm without S3, normal S1, S2.  ABDOMEN: Soft, non-tender but protuberant.  EXTREMITIES: No edema, clubbing or venous disease.  NEUROLOGIC: Intact.  HEENT: No jugular vein distention, lymphadenopathy, oropharynx clear.  NECK: Supple.   LABORATORY DATA:  Chest x-ray from Mar 14, 2006 showed no acute finding,  stable apical scarring. There have been no recent pulmonary function studies  available.   IMPRESSION:  Chronic obstructive lung disease with primary emphysematous and  asthmatic bronchitic components.   PLAN:  Pulse Prednisone at 40 mg a day, tapering down by 10 mg every 3 days  till off. She will switch from Foradil to Advair 250/50 one spray b.i.d. She  was instructed in proper use. Spiriva will be maintained and we will obtain  a full set of pulmonary function studies and return this patient for a  repeat in two months.  Charlcie Cradle Delford Field, MD, FCCP      PEW/MedQ  DD:  08/13/2006  DT:  08/15/2006  Job #:  [id number]   cc:   Georgina Quint. Plotnikov, MD

## 2011-03-29 NOTE — Letter (Signed)
September 08, 2006     Pam Gamble  [address]  [place], [place]  [address]   RE:  [name], [name]  MRN:  [id number]  /  DOB:  [birth year]   Dear [name]:   This is regarding your pulmonary function studies that you did on September 02, 2006.  They show a moderate degree of obstruction in your airways with  some reversibility following a medication treatment.  Please stay on your  inhaled medicines as prescribed and we will discuss these tests in more  detail on your next visit.    Sincerely,      Charlcie Cradle. Delford Field, MD, [facility]    PEW/MedQ  DD: 09/08/2006  DT: 09/09/2006  Job #: [id number]

## 2011-03-29 NOTE — Assessment & Plan Note (Signed)
[facility]                             PULMONARY OFFICE NOTE   Pam Gamble, Pam Gamble                        MRN:          [id number]  DATE:11/17/2006                            DOB:          1945/11/29    Pam Gamble is a 65 year old white female with history of chronic  obstructive lung disease, ex-smoker since December 2006. She is on the  Spiriva and Advair 250/50 one spray b.i.d. and Spiriva daily. She states  that her level of dyspnea is stable at this time. Maintains Nexium 40 mg  daily.   PHYSICAL EXAMINATION:  Temp 98.0, blood pressure 102/64, pulse 81,  saturation is 97% on room air.  CHEST: Showed distant breath sounds with prolonged expiratory phase. No  wheeze or rhonchi were noted.  CARDIAC: Showed a regular rate and rhythm without S3, normal S1, S2.  ABDOMEN: Soft and nontender.  EXTREMITIES: Showed no edema or clubbing.  SKIN: Was clear.   IMPRESSION:  Is that of chronic obstructive lung disease primary  emphysematous component.   PLAN:  Is to discontinue Spiriva and maintain Advair 250/50 one spray  b.i.d. and will see the patient back in return followup.     Charlcie Cradle Delford Field, MD, [facility]  Electronically Signed    PEW/MedQ  DD: 11/17/2006  DT: 11/17/2006  Job #: [id number]   cc:   Georgina Quint. Plotnikov, MD

## 2011-03-29 NOTE — Discharge Summary (Signed)
NAMEALTAIR, Pam Gamble                 ACCOUNT NO.:  [id number]   MEDICAL RECORD NO.:  [id number]          PATIENT TYPE:   LOCATION:                                 FACILITY:   PHYSICIAN:  Pam Morton. Juanda Chance, MD     DATE OF BIRTH:  1946/07/06   DATE OF ADMISSION:  12/10/2005  DATE OF DISCHARGE:  12/11/2005                                 DISCHARGE SUMMARY   ADMITTING DIAGNOSES:  1. Acute abdominal pain.  2. Diarrhea.  3. Inflammatory process involving cecum and ascending colon per CT.  Rule      out infectious versus ischemic colitis, rule out diverticulitis, though      this latter diagnosis is less likely.  4. Chronic obstructive pulmonary disease, and the patient continues to      smoke.  5. Gastroesophageal reflux disease with history of Barrett's esophagus for      which she is treated with daily Nexium.  6. History of presumed history of idiopathic thrombocytopenia purpura for      which she is status post splenectomy.  7. Status post hysterectomy.  8. Status post tonsillectomy and adenoidectomy.   BRIEF HISTORY:  Pam Gamble is a 65 year old white female who developed acute  lower abdominal pain with diarrhea and nausea.  Pain was much worse on the  right lower quadrant than on the left lower quadrant.  She was not having  any bloody stools, shortness of breath, chest pain or back pain.  Temperature maximum was 99 degrees.  She went to [facility] after leaving  work.  Her white blood cell count was 11,000. They sent her for CT scan of  [facility]. This showed inflammatory changes in the cecum and  ascending colon, also showed no abscess or presence of benign gallstones.  [facility] was contacted, and as she was in ongoing pain and was not doing  well as an outpatient; so she was sent to the ER and seen and admitted by  Dr. Marina Goodell.  She had not yet been started on any antibiotics.   PROCEDURES:  None.   CONSULTATIONS:  None.   LABORATORY DATA:  Hemoglobin 12.9,  hematocrit 37.9, white blood cell count  9.7, platelets 348,000.  Comprehensive metabolic profile with sodium 137,  potassium 4.3.  Glucose was elevated at 155.  BUN 6, creatinine 0.7.  Albumin 3.  Total bilirubin, alkaline phosphatase, AST and ALT all within  normal limits.  Stool fecal leukocytes negative.  Giardia, Cryptosporidium  screening negative. C. difficile toxin was negative.  Ova and parasite exam  was not completed.   HOSPITAL COURSE:  #1.  ABDOMINAL PAIN WITH DIARRHEA:  After looking over the data and taking  the history, it was felt strongly that the patient had acute right-sided  colitis that was suspicious for infectious etiology.  She was started on IV  Cipro and IV Flagyl.  However, at discharge  these were converted over to  oral Cipro with oral forms.  Stool studies failed to detect any infectious  agents.  She was provided a diet of clear liquids and  hydrated with D5 half-  normal saline with additional potassium.  Within 24 hours, the patient's  abdominal pain was much improved.  She had not required any pain medication.  She was still having loose stools.  She was stable and was requesting  discharge home as she has an elderly mother for whom she is the primary  caregiver.  She was discharged to home in stable and improved condition.   MEDICATIONS AT DISCHARGE:  1. Nexium 40 mg daily.  2. Wellbutrin, dose unknown, once daily.  3. Multivitamin once daily.  4. Vitamin B6 daily  5. Vitamin B12 filling  6. Vitamin C daily  7. Cipro 500 mg twice daily for 6 more days.  8. Flagyl 250 mg 4 times daily for 6 more days.  9. Robinul Forte 2 mg 1-2 daily as needed for cramping.   She was allowed to return to work on Monday, February 5.     ______________________________  Jennye Moccasin, PA-C      Pam Morton. Juanda Chance, MD  Electronically Signed    SG/MEDQ  D:  07/08/2006  T:  07/08/2006  Job:  [id number]   cc:   Gabriel Earing, M.D.

## 2011-03-29 NOTE — Discharge Summary (Signed)
[place]  Patient:    MAGHEN, GROUP Visit Number: [id number] MRN: [id number]          Service Type: MED Location: 3000 [id number] Attending Physician:  Carrie Mew Dictated by:   Janora Norlander, N.P. Admit Date:  08/13/2001 Discharge Date: 08/17/2001                             Discharge Summary  ADMISSION DIAGNOSIS:  Right upper quadrant pain, nausea, vomiting, watery stools.  DISCHARGE DIAGNOSES: 1. Probable duodenitis. 2. Resolving leukocytosis.  PRIMARY PHYSICIAN:  Dr. Posey Rea  LABORATORY DURING ADMISSION:  BMET on October 6 normal with sodium of 139, potassium 4.8, chloride 108, CO2 26.  Glucose was elevated at 127, BUN was low at 2, and creatinine 0.6.  CBC on October 6:  White blood cell count was at 9.9.  H&H was 12.3 and 36.0, respectively.  Neutrophils were at 33, lymphocytes were at 52, absolute lymphocytes were at 5.1, and monocytes were also slightly high at 0.8.  Her ESR on October 4 was normal at 17.  L/CEA was normal at 1.3.  LEHEC toxin was negative in her stool.  Her urine culture was also negative.  Her hepatic function was normal with the only abnormality being a low albumin at 3.4.  Lipase was 18, low, and amylase was normal at 94.  She had a scan of her liver and abdomen on October 4.  It was negative.  The cystic duct and common duct were patent.  There was a slight delay of passage of activity from the biliary ductal system into the small intestine, which theoretically raises the possibility of a distal common duct lesion but the radiologist felt that this was not likely.  CT of the abdomen on October 3: No acute findings demonstrated.  There were multiple hypervascular liver lesions, possibly hemangiomas.  An MRI could be performed in three months to determine stability.  Chest:  Borderline cardiac size with no active disease.  BRIEF HISTORY OF ADMISSION:  The patient was admitted on October 3  with abdominal pain, leukocytosis.  There was no documented fever.  Her initial white blood cell count was 16,000 to 17,000.  She was placed on IV antibiotics and hydrated.  At times, she did have hypotension with blood pressures in the upper 80s/40s and 30s; however, the patient did not appear to have any symptoms of hypotension.  She is ambulating steadily.  No headaches, no weakness, no presyncopal symptoms.  She continued to improve.  Her diet was advanced.  The patient has recently had several teeth removed.  Therefore, having a full diet was a bit difficult for her anyway.  Her ultrasound on October 4 also showed some gallbladder sludge without cholelithiasis or cholecystitis.  Since there was less evidence for gallbladder disease, the diagnosis moved towards peptic acid disease.  There was no evidence of GI bleeding.  On October 5, her Protonix was changed to p.o.  Her symptoms improved on this.  She was also changed to p.o. antibiotics on October 5, which she will continue for 10 days post discharge.  PHYSICAL EXAMINATION ON DISCHARGE:  VITAL SIGNS:  She is afebrile at 99, pulse 57, 20, she is 99/60, 98% on room air.  GENERAL:  She is ambulating in the hallway and was escorted back to a room by this examiner.  She is eager to go home and she wants disability  forms filled out, which were done and took approximately 20 minutes to complete, and we agreed to have her stay home tomorrow, when she can readjust her diet, and then return to work full-time on Wednesday the 9th.  On exam, she is feeling much better.  HEART:  Regular rate and rhythm.  LUNGS:  Clear to auscultation.  ABDOMEN:  Positive bowel sounds in all quadrants.  She is still slightly tender in the epigastric region but she is much improved.  EXTREMITIES:  No edema.  ASSESSMENT AND PLAN: 1. Resolving duodenitis on Protonix. 2. Infectious disease:  She is on Augmentin 875 mg p.o. b.i.d.  She is to    continue  for 10 days.  She can take one cup of yogurt at lunch or breakfast    along with her Augmentin.  Blood pressure is stable.  The patient is    without signs or symptoms of hypotension or orthostatic changes.  She    understands that she is to have no caffeine, no chocolate diet.  She is to    reduce the intake of greasy foods and increase the intake of baked or    boiled chicken and fish to help her protein levels.  She is to see Dr.    Posey Rea in 4-6 weeks. Dictated by:   Janora Norlander, N.P. Attending Physician:  Carrie Mew DD:  08/17/01 TD:  08/17/01 Job: [id number]

## 2011-05-23 ENCOUNTER — Other Ambulatory Visit (INDEPENDENT_AMBULATORY_CARE_PROVIDER_SITE_OTHER): Payer: 59

## 2011-05-23 DIAGNOSIS — N10 Acute tubulo-interstitial nephritis: Secondary | ICD-10-CM

## 2011-05-23 DIAGNOSIS — E119 Type 2 diabetes mellitus without complications: Secondary | ICD-10-CM

## 2011-05-23 LAB — URINALYSIS
Leukocytes, UA: NEGATIVE
Nitrite: NEGATIVE
Specific Gravity, Urine: 1.02 (ref 1.000–1.030)
Urobilinogen, UA: 0.2 (ref 0.0–1.0)
pH: 7.5 (ref 5.0–8.0)

## 2011-05-23 LAB — COMPREHENSIVE METABOLIC PANEL
ALT: 24 U/L (ref 0–35)
AST: 22 U/L (ref 0–37)
Albumin: 4 g/dL (ref 3.5–5.2)
Alkaline Phosphatase: 49 U/L (ref 39–117)
Potassium: 4.4 mEq/L (ref 3.5–5.1)
Sodium: 134 mEq/L — ABNORMAL LOW (ref 135–145)
Total Bilirubin: 0.9 mg/dL (ref 0.3–1.2)
Total Protein: 7.2 g/dL (ref 6.0–8.3)

## 2011-05-23 LAB — HEMOGLOBIN A1C: Hgb A1c MFr Bld: 8.9 % — ABNORMAL HIGH (ref 4.6–6.5)

## 2011-05-23 LAB — TSH: TSH: 0.95 u[IU]/mL (ref 0.35–5.50)

## 2011-05-28 ENCOUNTER — Ambulatory Visit (INDEPENDENT_AMBULATORY_CARE_PROVIDER_SITE_OTHER): Payer: 59 | Admitting: Internal Medicine

## 2011-05-28 ENCOUNTER — Encounter: Payer: Self-pay | Admitting: Internal Medicine

## 2011-05-28 DIAGNOSIS — E119 Type 2 diabetes mellitus without complications: Secondary | ICD-10-CM

## 2011-05-28 DIAGNOSIS — K2270 Barrett's esophagus without dysplasia: Secondary | ICD-10-CM

## 2011-05-28 DIAGNOSIS — J449 Chronic obstructive pulmonary disease, unspecified: Secondary | ICD-10-CM

## 2011-05-28 MED ORDER — ACCU-CHEK FASTCLIX LANCETS MISC: 1.0000 [IU] | Freq: Two times a day (BID) | Status: DC

## 2011-05-28 MED ORDER — ALBUTEROL SULFATE HFA 108 (90 BASE) MCG/ACT IN AERS: 2.0000 | INHALATION_SPRAY | RESPIRATORY_TRACT | Status: DC | PRN

## 2011-05-28 MED ORDER — GLUCOSE BLOOD VI STRP: ORAL_STRIP | Status: DC

## 2011-05-28 MED ORDER — MOMETASONE FURO-FORMOTEROL FUM 200-5 MCG/ACT IN AERO: 2.0000 | INHALATION_SPRAY | Freq: Two times a day (BID) | RESPIRATORY_TRACT | Status: DC

## 2011-05-28 NOTE — Assessment & Plan Note (Signed)
On Rx 

## 2011-05-28 NOTE — Assessment & Plan Note (Signed)
Refused add meds incl Actos, Insulin. She wants to do better with diet.

## 2011-05-28 NOTE — Progress Notes (Signed)
  Subjective:    Patient ID: Pam Gamble, female    DOB: 1946/08/29, 65 y.o.   MRN: [id number]  HPI    Review of Systems  Constitutional: Positive for fatigue. Negative for fever and chills.  HENT: Negative for tinnitus.   Eyes: Negative for pain.  Respiratory: Negative for shortness of breath.   Cardiovascular: Negative for leg swelling.  Genitourinary: Negative for urgency.  Musculoskeletal: Negative for back pain and joint swelling.  Neurological: Negative for weakness and headaches.  Psychiatric/Behavioral: Negative for decreased concentration.       Objective:   Physical Exam  Constitutional: She appears well-developed and well-nourished. No distress.  HENT:  Head: Normocephalic.  Right Ear: External ear normal.  Left Ear: External ear normal.  Nose: Nose normal.  Mouth/Throat: Oropharynx is clear and moist.  Eyes: Conjunctivae are normal. Pupils are equal, round, and reactive to light. Right eye exhibits no discharge. Left eye exhibits no discharge.  Neck: Normal range of motion. Neck supple. No JVD present. No tracheal deviation present. No thyromegaly present.  Cardiovascular: Normal rate, regular rhythm and normal heart sounds.   Pulmonary/Chest: No stridor. No respiratory distress. She has no wheezes.  Abdominal: Soft. Bowel sounds are normal. She exhibits no distension and no mass. There is no tenderness. There is no rebound and no guarding.  Musculoskeletal: She exhibits no edema and no tenderness.  Lymphadenopathy:    She has no cervical adenopathy.  Neurological: She displays normal reflexes. No cranial nerve deficit. She exhibits normal muscle tone. Coordination normal.  Skin: No rash noted. No erythema.  Psychiatric: She has a normal mood and affect. Her behavior is normal. Judgment and thought content normal.       Wt Readings from Last 3 Encounters:  05/28/11 158 lb (71.668 kg)  02/28/11 161 lb (73.029 kg)  01/12/11 164 lb (74.39 kg)    Lab Results    Component Value Date   WBC 9.8 02/21/2011   HGB 11.8* 02/21/2011   HCT 37.1 02/21/2011   PLT 351 02/21/2011   CHOL  Value: 200        ATP III CLASSIFICATION:  <200     mg/dL   Desirable  829-562  mg/dL   Borderline High  >=130    mg/dL   High        8/65/7846   TRIG 142 02/20/2011   HDL 36* 02/20/2011   LDLDIRECT 157.4 06/07/2009   ALT 24 05/23/2011   AST 22 05/23/2011   NA 134* 05/23/2011   K 4.4 05/23/2011   CL 98 05/23/2011   CREATININE 0.5 05/23/2011   BUN 14 05/23/2011   CO2 28 05/23/2011   TSH 0.95 05/23/2011   PSA 0.02* 01/01/2008   HGBA1C 8.9* 05/23/2011     Assessment & Plan:

## 2011-07-23 ENCOUNTER — Other Ambulatory Visit: Payer: Self-pay | Admitting: Critical Care Medicine

## 2011-09-04 ENCOUNTER — Other Ambulatory Visit: Payer: Self-pay | Admitting: Internal Medicine

## 2011-09-06 ENCOUNTER — Ambulatory Visit: Payer: 59 | Admitting: Internal Medicine

## 2011-09-09 ENCOUNTER — Ambulatory Visit: Payer: 59 | Admitting: Internal Medicine

## 2011-09-10 ENCOUNTER — Other Ambulatory Visit: Payer: Self-pay | Admitting: Internal Medicine

## 2011-09-12 DIAGNOSIS — Z0279 Encounter for issue of other medical certificate: Secondary | ICD-10-CM

## 2012-02-15 ENCOUNTER — Ambulatory Visit (INDEPENDENT_AMBULATORY_CARE_PROVIDER_SITE_OTHER): Payer: 59 | Admitting: Family Medicine

## 2012-02-15 ENCOUNTER — Encounter: Payer: Self-pay | Admitting: Family Medicine

## 2012-02-15 VITALS — BP 124/80 | Temp 98.3°F | Wt 157.0 lb

## 2012-02-15 DIAGNOSIS — J019 Acute sinusitis, unspecified: Secondary | ICD-10-CM

## 2012-02-15 MED ORDER — SULFAMETHOXAZOLE-TRIMETHOPRIM 800-160 MG PO TABS: 1.0000 | ORAL_TABLET | Freq: Two times a day (BID) | ORAL | Status: AC

## 2012-02-15 MED ORDER — MOMETASONE FUROATE 50 MCG/ACT NA SUSP: 2.0000 | Freq: Every day | NASAL | Status: DC

## 2012-02-15 MED ORDER — AZELASTINE HCL 0.1 % NA SOLN: 2.0000 | Freq: Two times a day (BID) | NASAL | Status: DC

## 2012-02-15 NOTE — Assessment & Plan Note (Signed)
New to provider.  Pt's sxs and PE consistent w/ infxn.  Start abx.  Reviewed supportive care and red flags that should prompt return.  Pt expressed understanding and is in agreement w/ plan.  

## 2012-02-15 NOTE — Patient Instructions (Signed)
This is a sinus infection Start the Bactrim twice daily- take w/ food Continue the Nasonex and Astelin every day for the allergy component Drink plenty of fluids Add Claritin or Zyrtec daily for the allergies REST! Call with any questions or concerns Hang in there!!!

## 2012-02-15 NOTE — Progress Notes (Signed)
  Subjective:    Patient ID: Pam Gamble, female    DOB: 1946-10-31, 66 y.o.   MRN: [id number]  HPI ? Sinusitis- sxs started 2 weeks ago w/ HA.  Pain starts in the sinuses and travels to back of head.  No fevers.  No ear pain.  No nasal congestion.  Intermittent cough.  Hx of seasonal allergies- using Astelin and Nasonex infrequently but this did provide some relief of sxs.  Took Aleve.  No nausea.  No photo or phonophobia.   Review of Systems For ROS see HPI     Objective:   Physical Exam  Vitals reviewed. Constitutional: She appears well-developed and well-nourished. No distress.  HENT:  Head: Normocephalic and atraumatic.  Right Ear: Tympanic membrane normal.  Left Ear: Tympanic membrane normal.  Nose: Mucosal edema and rhinorrhea present. Right sinus exhibits maxillary sinus tenderness and frontal sinus tenderness. Left sinus exhibits maxillary sinus tenderness and frontal sinus tenderness.  Mouth/Throat: Uvula is midline and mucous membranes are normal. Posterior oropharyngeal erythema present. No oropharyngeal exudate.  Eyes: Conjunctivae and EOM are normal. Pupils are equal, round, and reactive to light.  Neck: Normal range of motion. Neck supple.  Cardiovascular: Normal rate, regular rhythm and normal heart sounds.   Pulmonary/Chest: Effort normal and breath sounds normal. No respiratory distress. She has no wheezes.  Lymphadenopathy:    She has no cervical adenopathy.          Assessment & Plan:

## 2012-04-23 ENCOUNTER — Other Ambulatory Visit: Payer: Self-pay | Admitting: Internal Medicine

## 2012-05-06 DIAGNOSIS — Z0279 Encounter for issue of other medical certificate: Secondary | ICD-10-CM

## 2012-05-23 ENCOUNTER — Ambulatory Visit (INDEPENDENT_AMBULATORY_CARE_PROVIDER_SITE_OTHER): Payer: 59 | Admitting: Family Medicine

## 2012-05-23 ENCOUNTER — Encounter: Payer: Self-pay | Admitting: Family Medicine

## 2012-05-23 VITALS — BP 148/80 | HR 102 | Temp 98.6°F | Resp 16 | Wt 156.5 lb

## 2012-05-23 DIAGNOSIS — R059 Cough, unspecified: Secondary | ICD-10-CM

## 2012-05-23 DIAGNOSIS — R05 Cough: Secondary | ICD-10-CM

## 2012-05-23 HISTORY — DX: Cough, unspecified: R05.9

## 2012-05-23 MED ORDER — AZITHROMYCIN 250 MG PO TABS: ORAL_TABLET | ORAL | Status: AC

## 2012-05-23 MED ORDER — PREDNISONE 10 MG PO TABS: ORAL_TABLET | ORAL | Status: DC

## 2012-05-23 NOTE — Progress Notes (Signed)
Subjective:    Patient ID: Pam Gamble, female    DOB: 03/19/1946, 66 y.o.   MRN: [id number]  HPI 66 yo female new to me presents to weekend clinic with complaint of cough.   Dry cough, wheezing, DOE x 3 days.  Former smoker. H/o COPD - uses proventil and duonebs- used nebulizer this morning.  On dulera as well.  Hx of seasonal allergies- using Astelin and Nasonex infrequently but this did provide some relief of sxs.  Took Aleve.  No nausea.  No photo or phonophobia.  No fevers. No CP.  Patient Active Problem List  Diagnosis  . DIABETES, TYPE 2  . DEPRESSION  . ALLERGIC RHINITIS  . C O P D WITH ACUTE EXACERBATION  . COPD  . GASTROESOPHAGEAL REFLUX DISEASE  . BARRETTS ESOPHAGUS  . LATERAL EPICONDYLITIS  . CALCIFIC TENDINITIS  . CAROTID BRUIT  . DIARRHEA  . ADENOCARCINOMA, UTERUS, HX OF  . HIATAL HERNIA, HX OF  . TOBACCO USE, QUIT  . HYSTERECTOMY, PARTIAL, HX OF  . Other Acquired Absence of Organ  . SINUSITIS - ACUTE-NOS  . Pyelonephritis, acute  . Cough   Past Medical History  Diagnosis Date  . Calcium deposits in tendon and bursa   . Idiopathic thrombocythemia   . Esophageal reflux   . Personal history of malignant neoplasm of other parts of uterus   . Lateral epicondylitis  of elbow   . Personal history of other diseases of digestive system   . Barrett's esophagus   . Chronic airway obstruction, not elsewhere classified   . Diabetes mellitus   . Depression    Past Surgical History  Procedure Date  . Tonsillectomy and adenoidectomy   . Abdominal hysterectomy   . Splenectomy    History  Substance Use Topics  . Smoking status: Former Smoker    Quit date: 11/11/2004  . Smokeless tobacco: Not on file  . Alcohol Use: No   Family History  Problem Relation Age of Onset  . Liver cancer Sister   . Cancer Sister     pancreas  . Stroke Other   . Heart disease Mother     CHF  . Heart disease Father     MI   Allergies  Allergen Reactions  .  Amoxicillin-Pot Clavulanate     REACTION: diarrhea  . Penicillins     REACTION: nausea   Current Outpatient Prescriptions on File Prior to Visit  Medication Sig Dispense Refill  . ACCU-CHEK FASTCLIX LANCETS MISC 1 Units by Does not apply route 2 (two) times daily.  100 each  11  . albuterol (PROVENTIL HFA) 108 (90 BASE) MCG/ACT inhaler Inhale 2 puffs into the lungs every 4 (four) hours as needed.  1 Inhaler  11  . azelastine (ASTELIN) 137 MCG/SPRAY nasal spray Place 2 sprays into the nose 2 (two) times daily. Use in each nostril as directed  30 mL  3  . Cholecalciferol (VITAMIN D3) 1000 UNITS tablet Take 2,000 Units by mouth daily.        . DULERA 200-5 MCG/ACT AERO INHALE 2 PUFFS TWICE A DAY  1 Inhaler  2  . fluconazole (DIFLUCAN) 150 MG tablet Take 150 mg by mouth once.        Marland Kitchen glucose blood (ACCU-CHEK ACTIVE STRIPS) test strip Use as directed  100 each  5  . ipratropium-albuterol (DUONEB) 0.5-2.5 (3) MG/3ML SOLN Take 3 mLs by nebulization 4 (four) times daily as needed.        Marland Kitchen  JANUMET 50-1000 MG per tablet TAKE 1 TABLET BY MOUTH TWO TIMES A DAY WITH A MEAL  60 tablet  2  . mometasone (NASONEX) 50 MCG/ACT nasal spray Place 2 sprays into the nose daily.  17 g  3  . naproxen sodium (ANAPROX) 220 MG tablet Take 220 mg by mouth as needed.        Marland Kitchen omeprazole (PRILOSEC) 40 MG capsule TAKE 1 CAPSULE BY MOUTH EVERY MORNING FOR INDIGESTION  90 capsule  2  . omeprazole (PRILOSEC) 40 MG capsule TAKE 1 CAPSULE BY MOUTH EVERY MORNING FOR INDIGESTION  90 capsule  0  . TREXIMET 85-500 MG per tablet USE 1 TABLET AS DIRECTED  9 tablet  3   The PMH, PSH, Social History, Family History, Medications, and allergies have been reviewed in [facility], and have been updated if relevant.   Review of Systems For ROS see HPI     Objective:   Physical Exam  BP 148/80  Pulse 102  Temp 98.6 F (37 C) (Oral)  Resp 16  Wt 156 lb 8 oz (70.988 kg)  SpO2 90%  Constitutional: She appears well-developed and  well-nourished. No distress.  HENT:  Head: Normocephalic and atraumatic.  Right Ear: Tympanic membrane normal.  Left Ear: Tympanic membrane normal.  Nose: +mucosal erythema  present. No oropharyngeal exudate.  Eyes: Conjunctivae and EOM are normal. Pupils are equal, round, and reactive to light.  Neck: Normal range of motion. Neck supple.  Cardiovascular: Normal rate, regular rhythm and normal heart sounds.   Pulmonary/Chest: diffuse exp wheezes, no crackles, no increased WOB.  Lymphadenopathy:    She has no cervical adenopathy.      Assessment & Plan:   1. Cough    New- bronchitis vs COPD exacerbation. Non toxic, sating ok.  Ok to treat as outpt. Zpack and prednisone x 5 days. See pt instructions for details. The patient indicates understanding of these issues and agrees with the plan.

## 2012-05-23 NOTE — Patient Instructions (Addendum)
You have bronchitis. Start the Zpack and prednisone as directed- take w/ food Prednisone may increase your sugars-keep an eye on them. Continue the Nasonex and Astelin every day for the allergy component Drink plenty of fluids Add Claritin or Zyrtec daily for the allergies Use your proventil and nebulizer. Call with any questions or concerns

## 2012-06-04 ENCOUNTER — Encounter (HOSPITAL_COMMUNITY): Payer: Self-pay | Admitting: Neurology

## 2012-06-04 ENCOUNTER — Emergency Department (HOSPITAL_COMMUNITY)
Admission: EM | Admit: 2012-06-04 | Discharge: 2012-06-04 | Disposition: A | Discharge status: Home / Self Care | Payer: 59 | Attending: Emergency Medicine | Admitting: Emergency Medicine

## 2012-06-04 ENCOUNTER — Emergency Department (HOSPITAL_COMMUNITY): Payer: 59

## 2012-06-04 DIAGNOSIS — Z79899 Other long term (current) drug therapy: Secondary | ICD-10-CM | POA: Insufficient documentation

## 2012-06-04 DIAGNOSIS — R0600 Dyspnea, unspecified: Secondary | ICD-10-CM

## 2012-06-04 DIAGNOSIS — Z87891 Personal history of nicotine dependence: Secondary | ICD-10-CM | POA: Insufficient documentation

## 2012-06-04 DIAGNOSIS — K219 Gastro-esophageal reflux disease without esophagitis: Secondary | ICD-10-CM | POA: Insufficient documentation

## 2012-06-04 DIAGNOSIS — R079 Chest pain, unspecified: Secondary | ICD-10-CM | POA: Insufficient documentation

## 2012-06-04 DIAGNOSIS — F3289 Other specified depressive episodes: Secondary | ICD-10-CM | POA: Insufficient documentation

## 2012-06-04 DIAGNOSIS — E119 Type 2 diabetes mellitus without complications: Secondary | ICD-10-CM | POA: Insufficient documentation

## 2012-06-04 DIAGNOSIS — F329 Major depressive disorder, single episode, unspecified: Secondary | ICD-10-CM | POA: Insufficient documentation

## 2012-06-04 DIAGNOSIS — J441 Chronic obstructive pulmonary disease with (acute) exacerbation: Secondary | ICD-10-CM | POA: Insufficient documentation

## 2012-06-04 LAB — CBC
MCH: 30.2 pg (ref 26.0–34.0)
MCHC: 32.1 g/dL (ref 30.0–36.0)
MCV: 94.2 fL (ref 78.0–100.0)
Platelets: 351 10*3/uL (ref 150–400)
RBC: 4.17 MIL/uL (ref 3.87–5.11)

## 2012-06-04 LAB — BASIC METABOLIC PANEL
CO2: 23 mEq/L (ref 19–32)
Calcium: 8.9 mg/dL (ref 8.4–10.5)
Creatinine, Ser: 0.51 mg/dL (ref 0.50–1.10)
GFR calc non Af Amer: >90 mL/min (ref >90)
Glucose, Bld: 227 mg/dL — ABNORMAL HIGH (ref 70–99)

## 2012-06-04 LAB — PRO B NATRIURETIC PEPTIDE: Pro B Natriuretic peptide (BNP): 140.8 pg/mL — ABNORMAL HIGH (ref 0–125)

## 2012-06-04 MED ORDER — PREDNISONE 20 MG PO TABS: 40.0000 mg | ORAL_TABLET | Freq: Every day | ORAL | Status: DC

## 2012-06-04 MED ORDER — METHYLPREDNISOLONE SODIUM SUCC 125 MG IJ SOLR
125.0000 mg | Freq: Once | INTRAMUSCULAR | Status: AC
  Administered 2012-06-04: 125 mg via INTRAVENOUS
  Filled 2012-06-04: qty 2

## 2012-06-04 MED ORDER — IPRATROPIUM BROMIDE 0.02 % IN SOLN
0.5000 mg | Freq: Once | RESPIRATORY_TRACT | Status: AC
  Administered 2012-06-04: 0.5 mg via RESPIRATORY_TRACT
  Filled 2012-06-04: qty 2.5

## 2012-06-04 MED ORDER — ALBUTEROL SULFATE HFA 108 (90 BASE) MCG/ACT IN AERS: 2.0000 | INHALATION_SPRAY | RESPIRATORY_TRACT | Status: DC | PRN

## 2012-06-04 MED ORDER — ALBUTEROL (5 MG/ML) CONTINUOUS INHALATION SOLN
10.0000 mg | INHALATION_SOLUTION | RESPIRATORY_TRACT | Status: AC
  Administered 2012-06-04: 10 mg via RESPIRATORY_TRACT
  Filled 2012-06-04: qty 20

## 2012-06-04 NOTE — ED Notes (Addendum)
Pt reporting sob, wheezing x several weeks. Hx of COPD and asthma. Pt speaking in clear, concise sentences. Pt reports cough, no fevers. Pt ambulated to room from triage independently. Some wheezing present. Pt a x 4. Reports using inhaler and nebulizer this morning with some relief. C/o central cp 3/10. Skin warm and dry.

## 2012-06-04 NOTE — ED Notes (Signed)
Pt ambulated without difficulty. Oxygenation dropped as low as 92%. Tolerated well. Stand-by-assist

## 2012-06-04 NOTE — ED Notes (Signed)
Respiratory called for continuous neb 

## 2012-06-04 NOTE — ED Provider Notes (Signed)
History     CSN: [id number]  Arrival date & time 06/04/12  0740   First MD Initiated Contact with Patient 06/04/12 0750      Chief Complaint  Patient presents with  . Shortness of Breath  . Chest Pain    (Consider location/radiation/quality/duration/timing/severity/associated sxs/prior treatment) Patient is a 66 y.o. female presenting with shortness of breath and chest pain. The history is provided by the patient.  Shortness of Breath  The current episode started more than 1 week ago. The onset was gradual. The problem occurs frequently. The problem has been gradually worsening. The problem is moderate. The symptoms are relieved by rest. The symptoms are aggravated by activity. Associated symptoms include chest pain, orthopnea, cough, shortness of breath and wheezing. Pertinent negatives include no chest pressure, no fever, no rhinorrhea, no sore throat and no stridor. She has not inhaled smoke recently. Steroid use: saw PMD, completed 5 day prednisone pulse 1 wk ago. She has had prior hospitalizations (none recent). She has had no prior ICU admissions. She has had no prior intubations. Her past medical history is significant for asthma and past wheezing. She has been behaving normally. There were no sick contacts. Recently, medical care has been given by the PCP (presented to office for same, reports some improvement after abx and steroid pulse then s/s returned).  Chest Pain Primary symptoms include fatigue, shortness of breath, cough and wheezing. Pertinent negatives for primary symptoms include no fever, no palpitations and no dizziness.  The patient's medical history is significant for asthma.  The patient's medical history is significant for asthma.  Associated symptoms include orthopnea.  Pertinent negatives for associated symptoms include no diaphoresis.   Pt c/o feeling SOB, worse with exertion.  Describes mild chest tightness, and soreness associated with coughing.  Reports the  cough sometimes produces clear sputum.  Past Medical History  Diagnosis Date  . Calcium deposits in tendon and bursa   . Idiopathic thrombocythemia   . Esophageal reflux   . Personal history of malignant neoplasm of other parts of uterus   . Lateral epicondylitis  of elbow   . Personal history of other diseases of digestive system   . Barrett's esophagus   . Chronic airway obstruction, not elsewhere classified   . Diabetes mellitus   . Depression     Past Surgical History  Procedure Date  . Tonsillectomy and adenoidectomy   . Abdominal hysterectomy   . Splenectomy     Family History  Problem Relation Age of Onset  . Liver cancer Sister   . Cancer Sister     pancreas  . Stroke Other   . Heart disease Mother     CHF  . Heart disease Father     MI    History  Substance Use Topics  . Smoking status: Former Smoker    Quit date: 11/11/2004  . Smokeless tobacco: Not on file  . Alcohol Use: No    OB History    Grav Para Term Preterm Abortions TAB SAB Ect Mult Living                  Review of Systems  Constitutional: Positive for fatigue. Negative for fever, chills, diaphoresis and activity change.  HENT: Positive for sneezing. Negative for congestion, sore throat, facial swelling, rhinorrhea, neck pain and sinus pressure.   Eyes: Negative.   Respiratory: Positive for cough, chest tightness, shortness of breath and wheezing. Negative for apnea and stridor.   Cardiovascular: Positive for chest  pain and orthopnea. Negative for palpitations and leg swelling.  Gastrointestinal: Negative.   Genitourinary: Negative.   Musculoskeletal: Negative.   Skin: Negative.   Neurological: Negative for dizziness, syncope and headaches.  Hematological: Negative for adenopathy.  Psychiatric/Behavioral: Negative.     Allergies  Amoxicillin-pot clavulanate and Penicillins  Home Medications   Current Outpatient Rx  Name Route Sig Dispense Refill  . ACCU-CHEK FASTCLIX LANCETS  MISC Does not apply 1 Units by Does not apply route 2 (two) times daily. 100 each 11  . ALBUTEROL SULFATE HFA 108 (90 BASE) MCG/ACT IN AERS Inhalation Inhale 2 puffs into the lungs every 4 (four) hours as needed. 1 Inhaler 11  . AZELASTINE HCL 137 MCG/SPRAY NA SOLN Nasal Place 2 sprays into the nose 2 (two) times daily. Use in each nostril as directed 30 mL 3  . VITAMIN D3 1000 UNITS PO TABS Oral Take 2,000 Units by mouth daily.      . DULERA 200-5 MCG/ACT IN AERO  INHALE 2 PUFFS TWICE A DAY 1 Inhaler 2  . FLUCONAZOLE 150 MG PO TABS Oral Take 150 mg by mouth once.      Marland Kitchen GLUCOSE BLOOD VI STRP  Use as directed 100 each 5  . IPRATROPIUM-ALBUTEROL 0.5-2.5 (3) MG/3ML IN SOLN Nebulization Take 3 mLs by nebulization 4 (four) times daily as needed.      Marland Kitchen JANUMET 50-1000 MG PO TABS  TAKE 1 TABLET BY MOUTH TWO TIMES A DAY WITH A MEAL 60 tablet 2    Yearly physical due in July must see md b4 additio ...  . MOMETASONE FUROATE 50 MCG/ACT NA SUSP Nasal Place 2 sprays into the nose daily. 17 g 3  . NAPROXEN SODIUM 220 MG PO TABS Oral Take 220 mg by mouth as needed.      Marland Kitchen OMEPRAZOLE 40 MG PO CPDR  TAKE 1 CAPSULE BY MOUTH EVERY MORNING FOR INDIGESTION 90 capsule 2  . OMEPRAZOLE 40 MG PO CPDR  TAKE 1 CAPSULE BY MOUTH EVERY MORNING FOR INDIGESTION 90 capsule 0    Yearly physical due in July no additional refills  ...  . PREDNISONE 10 MG PO TABS  3 tabs by mouth daily x 5 days then stop. 15 tablet 0  . TREXIMET 85-500 MG PO TABS  USE 1 TABLET AS DIRECTED 9 tablet 3    BP 121/50  Pulse 83  Temp 98.7 F (37.1 C) (Oral)  Resp 24  SpO2 97%  Physical Exam  Constitutional: She is oriented to person, place, and time. She appears well-developed and well-nourished. No distress. She is not intubated.  HENT:  Head: Normocephalic and atraumatic.  Right Ear: External ear normal.  Left Ear: External ear normal.  Nose: Nose normal.  Mouth/Throat: Oropharynx is clear and moist. No oropharyngeal exudate.  Eyes:  Conjunctivae and EOM are normal. Pupils are equal, round, and reactive to light. Right eye exhibits no discharge. Left eye exhibits no discharge. No scleral icterus.  Neck: Normal range of motion. Neck supple. No JVD present. No tracheal deviation present.  Cardiovascular: Normal rate, regular rhythm, S1 normal, S2 normal, normal heart sounds and normal pulses.   No extrasystoles are present. PMI is not displaced.  Exam reveals no gallop, no S3, no S4, no distant heart sounds and no friction rub.   No murmur heard. Pulmonary/Chest: No accessory muscle usage or stridor. No apnea and not tachypneic. She is not intubated. No respiratory distress. She has no decreased breath sounds. She has wheezes. She  has no rhonchi. She has no rales. She exhibits no mass, no tenderness and no retraction.  Musculoskeletal: Normal range of motion. She exhibits no edema and no tenderness.  Lymphadenopathy:    She has no cervical adenopathy.  Neurological: She is alert and oriented to person, place, and time. No cranial nerve deficit. Coordination normal.  Skin: Skin is warm and dry. No rash noted. She is not diaphoretic. No erythema. No pallor.  Psychiatric: She has a normal mood and affect. Her behavior is normal. Judgment and thought content normal.    ED Course  Procedures (including critical care time)   Labs Reviewed  CBC  BASIC METABOLIC PANEL  PRO B NATRIURETIC PEPTIDE   No results found.   No diagnosis found.  Date: 06/04/2012  Rate:82 bpm   Rhythm: normal sinus rhythm  QRS Axis: normal  Intervals: normal  ST/T Wave abnormalities: nonspecific ST changes  Conduction Disutrbances:none  Narrative Interpretation:   Old EKG Reviewed:      MDM  Pt presents with shortness of breath, and known hx of COPD.  Reports compliance with home meds but worsening dyspnea.  She is not O2 dependent and has marked wheezing on exam bilat but demonstrates no distress.  Will obtain peak flow pre and post neb  treatments, administer solumedrol, check CXR.  Will also assess for evidence of CHF because pt reports some orthopnea.  CXR clear.  Pt receiving nebs currently, cont bilat wheezing.  1100 - nebs completed, pt is still wheezing but feels better.  Plan ambulate with pulse ox to assess for decrease in O2 sat.  Will recommend admission if there is, if nl O2 maintained, will try treatment as an outpt.  1300 - pt stable, NAD.  Although she has some cont wheezing, no signif decrease in O2 sat with ambulation.  No clinical or radiographic evidence of PNA.  Plan prednisone pulse, prn albuterol and nebs, and close f/u with her PMD.  Discussed indications for return to the emergency department.  Pt demonstrates understanding.      Tobin Chad, MD 06/04/12 1312

## 2012-06-08 ENCOUNTER — Encounter: Payer: Self-pay | Admitting: Internal Medicine

## 2012-06-08 ENCOUNTER — Ambulatory Visit (INDEPENDENT_AMBULATORY_CARE_PROVIDER_SITE_OTHER): Payer: 59 | Admitting: Internal Medicine

## 2012-06-08 VITALS — BP 136/90 | HR 92 | Temp 98.3°F | Resp 16 | Wt 158.0 lb

## 2012-06-08 DIAGNOSIS — E119 Type 2 diabetes mellitus without complications: Secondary | ICD-10-CM

## 2012-06-08 DIAGNOSIS — K219 Gastro-esophageal reflux disease without esophagitis: Secondary | ICD-10-CM

## 2012-06-08 DIAGNOSIS — R05 Cough: Secondary | ICD-10-CM

## 2012-06-08 DIAGNOSIS — J449 Chronic obstructive pulmonary disease, unspecified: Secondary | ICD-10-CM

## 2012-06-08 DIAGNOSIS — J441 Chronic obstructive pulmonary disease with (acute) exacerbation: Secondary | ICD-10-CM

## 2012-06-08 DIAGNOSIS — R059 Cough, unspecified: Secondary | ICD-10-CM

## 2012-06-08 MED ORDER — IPRATROPIUM-ALBUTEROL 0.5-2.5 (3) MG/3ML IN SOLN: 3.0000 mL | Freq: Four times a day (QID) | RESPIRATORY_TRACT | Status: DC | PRN

## 2012-06-08 MED ORDER — SITAGLIPTIN PHOS-METFORMIN HCL 50-1000 MG PO TABS: 1.0000 | ORAL_TABLET | Freq: Two times a day (BID) | ORAL | Status: DC

## 2012-06-08 MED ORDER — PROMETHAZINE-CODEINE 6.25-10 MG/5ML PO SYRP: 5.0000 mL | ORAL_SOLUTION | ORAL | Status: AC | PRN

## 2012-06-08 MED ORDER — DOXYCYCLINE HYCLATE 100 MG PO TABS: 100.0000 mg | ORAL_TABLET | Freq: Two times a day (BID) | ORAL | Status: AC

## 2012-06-08 MED ORDER — OMEPRAZOLE 40 MG PO CPDR: 40.0000 mg | DELAYED_RELEASE_CAPSULE | Freq: Every day | ORAL | Status: DC

## 2012-06-08 NOTE — Assessment & Plan Note (Signed)
Prom-cod 

## 2012-06-08 NOTE — Assessment & Plan Note (Signed)
Labs

## 2012-06-08 NOTE — Assessment & Plan Note (Signed)
Worse  Continue with current prescription therapy as reflected on the Med list - abx, predn, HHN

## 2012-06-08 NOTE — Progress Notes (Signed)
Patient ID: Pam Gamble, female   DOB: April 26, 1946, 66 y.o.   MRN: [id number]  Subjective:    Patient ID: Pam Gamble, female    DOB: [birth year], 66 y.o.   MRN: [id number]  Shortness of Breath This is a recurrent problem. The current episode started in the past 7 days. The problem occurs every several days. The problem has been waxing and waning. Pertinent negatives include no fever, headaches or leg swelling. The symptoms are aggravated by any activity and exercise. Risk factors include no known risk factors. She has tried beta agonist inhalers for the symptoms. The treatment provided moderate relief. Her past medical history is significant for COPD. There is no history of allergies.   The patient presents for a follow-up of  chronic hypertension, chronic dyslipidemia, type 2 diabetes controlled with medicines  BP Readings from Last 3 Encounters:  06/08/12 136/90  06/04/12 110/49  05/23/12 148/80   Wt Readings from Last 3 Encounters:  06/08/12 158 lb (71.668 kg)  05/23/12 156 lb 8 oz (70.988 kg)  02/15/12 157 lb (71.215 kg)      Review of Systems  Constitutional: Positive for fatigue. Negative for fever and chills.  HENT: Negative for tinnitus.   Eyes: Negative for pain.  Respiratory: Positive for shortness of breath.   Cardiovascular: Negative for leg swelling.  Genitourinary: Negative for urgency.  Musculoskeletal: Negative for back pain and joint swelling.  Neurological: Negative for weakness and headaches.  Psychiatric/Behavioral: Negative for decreased concentration.       Objective:   Physical Exam  Constitutional: She appears well-developed and well-nourished. No distress.  HENT:  Head: Normocephalic.  Right Ear: External ear normal.  Left Ear: External ear normal.  Nose: Nose normal.  Mouth/Throat: Oropharynx is clear and moist.  Eyes: Conjunctivae are normal. Pupils are equal, round, and reactive to light. Right eye exhibits no discharge. Left eye exhibits no  discharge.  Neck: Normal range of motion. Neck supple. No JVD present. No tracheal deviation present. No thyromegaly present.  Cardiovascular: Normal rate, regular rhythm and normal heart sounds.   Pulmonary/Chest: No stridor. No respiratory distress. She has no wheezes.  Abdominal: Soft. Bowel sounds are normal. She exhibits no distension and no mass. There is no tenderness. There is no rebound and no guarding.  Musculoskeletal: She exhibits no edema and no tenderness.  Lymphadenopathy:    She has no cervical adenopathy.  Neurological: She displays normal reflexes. No cranial nerve deficit. She exhibits normal muscle tone. Coordination normal.  Skin: No rash noted. No erythema.  Psychiatric: She has a normal mood and affect. Her behavior is normal. Judgment and thought content normal.         Lab Results  Component Value Date   WBC 10.5 06/04/2012   HGB 12.6 06/04/2012   HCT 39.3 06/04/2012   PLT 351 06/04/2012   CHOL  Value: 200        ATP III CLASSIFICATION:  <200     mg/dL   Desirable  086-578  mg/dL   Borderline High  >=469    mg/dL   High        05/10/5283   TRIG 142 02/20/2011   HDL 36* 02/20/2011   LDLDIRECT 157.4 06/07/2009   ALT 24 05/23/2011   AST 22 05/23/2011   NA 140 06/04/2012   K 3.8 06/04/2012   CL 102 06/04/2012   CREATININE 0.51 06/04/2012   BUN 14 06/04/2012   CO2 23 06/04/2012   TSH 0.95 05/23/2011  PSA 0.02* 01/01/2008   HGBA1C 8.9* 05/23/2011     Assessment & Plan:

## 2012-06-08 NOTE — Assessment & Plan Note (Signed)
As above.

## 2012-06-08 NOTE — Assessment & Plan Note (Signed)
Omeprazole Rx

## 2012-07-21 ENCOUNTER — Other Ambulatory Visit: Payer: Self-pay | Admitting: Internal Medicine

## 2012-07-23 ENCOUNTER — Other Ambulatory Visit: Payer: Self-pay | Admitting: Internal Medicine

## 2012-07-29 ENCOUNTER — Telehealth: Payer: Self-pay | Admitting: Internal Medicine

## 2012-07-29 NOTE — Telephone Encounter (Signed)
Left detailed mess informing pt to callback tom AM at 8 am and we can see her at 11:15am tom.

## 2012-07-29 NOTE — Telephone Encounter (Signed)
Caller: Danisha/Patient; Patient Name: Pam Gamble; PCP: Plotnikov, Alex (Adults only); Best Callback Phone Number: [phone].  Call regarding Wheezing returning, onset 1 week.  COPD history, last seen on 7-26, finished Prednisone, ask me to call back if wheezing returned. Patient states, Dr Posey Rea has Antibiotics scripts for me on-hand due to no spleen, I have none at the moment.  All emergent symptoms ruled out per Cough Protocol, Emergency Room disposition due to worsening Cough with COPD and audible wheezing.  No appointment remaining on 9-18, Patient has no respriatory disress.  Please call Patient back if MD will fit in on 9-18 or send to Hospital.

## 2012-07-30 ENCOUNTER — Encounter: Payer: Self-pay | Admitting: Internal Medicine

## 2012-07-30 ENCOUNTER — Ambulatory Visit (INDEPENDENT_AMBULATORY_CARE_PROVIDER_SITE_OTHER): Payer: 59 | Admitting: Internal Medicine

## 2012-07-30 VITALS — BP 130/70 | HR 84 | Temp 98.3°F | Resp 16 | Wt 160.0 lb

## 2012-07-30 DIAGNOSIS — Z9081 Acquired absence of spleen: Secondary | ICD-10-CM

## 2012-07-30 DIAGNOSIS — J309 Allergic rhinitis, unspecified: Secondary | ICD-10-CM

## 2012-07-30 DIAGNOSIS — J441 Chronic obstructive pulmonary disease with (acute) exacerbation: Secondary | ICD-10-CM

## 2012-07-30 DIAGNOSIS — J449 Chronic obstructive pulmonary disease, unspecified: Secondary | ICD-10-CM

## 2012-07-30 DIAGNOSIS — Z9089 Acquired absence of other organs: Secondary | ICD-10-CM

## 2012-07-30 DIAGNOSIS — R059 Cough, unspecified: Secondary | ICD-10-CM

## 2012-07-30 DIAGNOSIS — R05 Cough: Secondary | ICD-10-CM

## 2012-07-30 DIAGNOSIS — E119 Type 2 diabetes mellitus without complications: Secondary | ICD-10-CM

## 2012-07-30 DIAGNOSIS — K219 Gastro-esophageal reflux disease without esophagitis: Secondary | ICD-10-CM

## 2012-07-30 DIAGNOSIS — J4489 Other specified chronic obstructive pulmonary disease: Secondary | ICD-10-CM

## 2012-07-30 HISTORY — DX: Acquired absence of spleen: Z90.81

## 2012-07-30 MED ORDER — PREDNISONE 10 MG PO TABS: ORAL_TABLET | ORAL | Status: DC

## 2012-07-30 MED ORDER — MOXIFLOXACIN HCL 400 MG PO TABS: 400.0000 mg | ORAL_TABLET | Freq: Every day | ORAL | Status: DC

## 2012-07-30 MED ORDER — METHYLPREDNISOLONE ACETATE 80 MG/ML IJ SUSP
120.0000 mg | Freq: Once | INTRAMUSCULAR | Status: AC
  Administered 2012-07-30: 120 mg via INTRAMUSCULAR

## 2012-07-30 MED ORDER — DOXYCYCLINE HYCLATE 100 MG PO TABS: 100.0000 mg | ORAL_TABLET | Freq: Two times a day (BID) | ORAL | Status: DC

## 2012-07-30 MED ORDER — TUDORZA PRESSAIR 400 MCG/ACT IN AEPB: 1.0000 | INHALATION_SPRAY | Freq: Two times a day (BID) | RESPIRATORY_TRACT | Status: DC

## 2012-07-30 MED ORDER — BUDESONIDE-FORMOTEROL FUMARATE 160-4.5 MCG/ACT IN AERO: 2.0000 | INHALATION_SPRAY | Freq: Two times a day (BID) | RESPIRATORY_TRACT | Status: DC

## 2012-07-30 NOTE — Assessment & Plan Note (Signed)
Avelox prn high fever Vaccinnes

## 2012-07-30 NOTE — Assessment & Plan Note (Addendum)
She would need to go on a short term disability to control her sx's Added Tudorza bid See meds Chart, notes, tests were reviewed

## 2012-07-30 NOTE — Telephone Encounter (Signed)
Pt was seen in office today at 11:15.

## 2012-07-30 NOTE — Assessment & Plan Note (Signed)
Avelox prn fever

## 2012-07-30 NOTE — Assessment & Plan Note (Signed)
Continue with current prescription therapy as reflected on the Med list.  

## 2012-07-30 NOTE — Progress Notes (Signed)
Subjective:    Patient ID: Pam Gamble, female    DOB: [birth year], 66 y.o.   MRN: [id number]  Shortness of Breath This is a recurrent problem. The current episode started 1 to 4 weeks ago. The problem occurs constantly. The problem has been gradually worsening. Associated symptoms include a fever, a sore throat and wheezing. Pertinent negatives include no headaches or leg swelling. The symptoms are aggravated by any activity and exercise. Risk factors include no known risk factors. She has tried beta agonist inhalers for the symptoms. The treatment provided moderate relief. Her past medical history is significant for bronchiolitis and COPD. There is no history of allergies.  Wheezing  This is a recurrent problem. The problem has been gradually worsening. Associated symptoms include coughing, a fever, shortness of breath and a sore throat. Pertinent negatives include no chills or headaches. The treatment provided mild relief. Her past medical history is significant for bronchiolitis and COPD.   The patient presents for a follow-up of  chronic hypertension, chronic dyslipidemia, type 2 diabetes controlled with medicines. C/o COPD.  She has been having a lot of difficulties at work due to SOB and cough - worse over past 2 wks  BP Readings from Last 3 Encounters:  07/30/12 130/70  06/08/12 136/90  06/04/12 110/49   Wt Readings from Last 3 Encounters:  07/30/12 160 lb (72.576 kg)  06/08/12 158 lb (71.668 kg)  05/23/12 156 lb 8 oz (70.988 kg)      Review of Systems  Constitutional: Positive for fever and fatigue. Negative for chills.  HENT: Positive for sore throat. Negative for tinnitus.   Eyes: Negative for pain.  Respiratory: Positive for cough, shortness of breath and wheezing.   Cardiovascular: Negative for leg swelling.  Genitourinary: Negative for urgency.  Musculoskeletal: Negative for back pain and joint swelling.  Neurological: Negative for weakness and headaches.    Psychiatric/Behavioral: Negative for decreased concentration.       Objective:   Physical Exam  Constitutional: She appears well-developed and well-nourished. No distress.  HENT:  Head: Normocephalic.  Right Ear: External ear normal.  Left Ear: External ear normal.  Nose: Nose normal.  Mouth/Throat: Oropharynx is clear and moist.  Eyes: Conjunctivae normal are normal. Pupils are equal, round, and reactive to light. Right eye exhibits no discharge. Left eye exhibits no discharge.  Neck: Normal range of motion. Neck supple. No JVD present. No tracheal deviation present. No thyromegaly present.  Cardiovascular: Normal rate, regular rhythm and normal heart sounds.   Pulmonary/Chest: No stridor. No respiratory distress. She has wheezes.  Abdominal: Soft. Bowel sounds are normal. She exhibits no distension and no mass. There is no tenderness. There is no rebound and no guarding.  Musculoskeletal: She exhibits no edema and no tenderness.  Lymphadenopathy:    She has no cervical adenopathy.  Neurological: She displays normal reflexes. No cranial nerve deficit. She exhibits normal muscle tone. Coordination normal.  Skin: No rash noted. No erythema.  Psychiatric: She has a normal mood and affect. Her behavior is normal. Judgment and thought content normal.         Lab Results  Component Value Date   WBC 10.5 06/04/2012   HGB 12.6 06/04/2012   HCT 39.3 06/04/2012   PLT 351 06/04/2012   CHOL  Value: 200        ATP III CLASSIFICATION:  <200     mg/dL   Desirable  409-811  mg/dL   Borderline High  >=914    mg/dL  High        02/20/2011   TRIG 142 02/20/2011   HDL 36* 02/20/2011   LDLDIRECT 157.4 06/07/2009   ALT 24 05/23/2011   AST 22 05/23/2011   NA 140 06/04/2012   K 3.8 06/04/2012   CL 102 06/04/2012   CREATININE 0.51 06/04/2012   BUN 14 06/04/2012   CO2 23 06/04/2012   TSH 0.95 05/23/2011   PSA 0.02* 01/01/2008   HGBA1C 8.9* 05/23/2011   I personally provided the Symbicort and Tudorza  inhalers use teaching. After the teaching patient was able to demonstrate it's use effectively. All questions were answered   Assessment & Plan:   A complex case

## 2012-07-30 NOTE — Assessment & Plan Note (Addendum)
Depo 120 mg IM HHN Albuterol given Predn taper if not better Doxy x 2 wks

## 2012-07-30 NOTE — Assessment & Plan Note (Signed)
Prom-cod syr 

## 2012-09-11 ENCOUNTER — Ambulatory Visit: Payer: 59 | Admitting: Internal Medicine

## 2013-01-21 ENCOUNTER — Ambulatory Visit (INDEPENDENT_AMBULATORY_CARE_PROVIDER_SITE_OTHER)
Admission: RE | Admit: 2013-01-21 | Discharge: 2013-01-21 | Disposition: A | Discharge status: Home / Self Care | Payer: Medicare Other | Source: Ambulatory Visit | Attending: Internal Medicine | Admitting: Internal Medicine

## 2013-01-21 ENCOUNTER — Encounter: Payer: Self-pay | Admitting: Internal Medicine

## 2013-01-21 ENCOUNTER — Ambulatory Visit (INDEPENDENT_AMBULATORY_CARE_PROVIDER_SITE_OTHER): Payer: Medicare Other | Admitting: Internal Medicine

## 2013-01-21 VITALS — BP 142/88 | HR 128 | Temp 98.7°F | Ht 62.0 in | Wt 162.0 lb

## 2013-01-21 DIAGNOSIS — J441 Chronic obstructive pulmonary disease with (acute) exacerbation: Secondary | ICD-10-CM

## 2013-01-21 MED ORDER — METHYLPREDNISOLONE ACETATE 80 MG/ML IJ SUSP
80.0000 mg | Freq: Once | INTRAMUSCULAR | Status: AC
  Administered 2013-01-21: 80 mg via INTRAMUSCULAR

## 2013-01-21 MED ORDER — FLUCONAZOLE 150 MG PO TABS: 150.0000 mg | ORAL_TABLET | Freq: Once | ORAL | Status: DC

## 2013-01-21 MED ORDER — PREDNISONE 10 MG PO TABS: ORAL_TABLET | ORAL | Status: DC

## 2013-01-21 MED ORDER — LEVOFLOXACIN 500 MG PO TABS: 500.0000 mg | ORAL_TABLET | Freq: Every day | ORAL | Status: DC

## 2013-01-21 MED ORDER — METHYLPREDNISOLONE ACETATE 40 MG/ML IJ SUSP
40.0000 mg | Freq: Once | INTRAMUSCULAR | Status: AC
  Administered 2013-01-21: 40 mg via INTRAMUSCULAR

## 2013-01-21 NOTE — Patient Instructions (Signed)

## 2013-01-21 NOTE — Progress Notes (Signed)
HPI  Pt presents to the clinic today with c/o shortness of breath and wheezing for the past 2 days. She does have COPD but states that her breathing has gotten worse. She has been using her nebulizer and inhalers at home but they are not providing much relief. She does have a dry cough. She denies fever, chills or body aches. She has not taken anything OTC. She does not smoke.  Review of Systems      Past Medical History  Diagnosis Date  . Calcium deposits in tendon and bursa   . Idiopathic thrombocythemia   . Esophageal reflux   . Personal history of malignant neoplasm of other parts of uterus   . Lateral epicondylitis  of elbow   . Personal history of other diseases of digestive system   . Barrett's esophagus   . Chronic airway obstruction, not elsewhere classified   . Diabetes mellitus   . Depression     Family History  Problem Relation Age of Onset  . Liver cancer Sister   . Cancer Sister     pancreas  . Stroke Other   . Heart disease Mother     CHF  . Heart disease Father     MI    History   Social History  . Marital Status: Single    Spouse Name: N/A    Number of Children: N/A  . Years of Education: N/A   Occupational History  . 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 Smoking status: Former Smoker    Quit date: 11/11/2004  . Smokeless tobacco: Not on file  . Alcohol Use: No  . Drug Use: No  . Sexually Active: Not on file   Other Topics Concern  . Not on file   Social History Narrative  . No narrative on file    Allergies  Allergen Reactions  . Amoxicillin-Pot Clavulanate     REACTION: diarrhea  . Penicillins     REACTION: nausea     Constitutional: Positive fatigue. Denies headache, fever or abrupt weight changes.  HEENT:   Denies eye redness, eye pain, pressure behind the eyes, facial pain, nasal congestion, ear pain, ringing in the ears, wax buildup, runny nose or bloody nose. Respiratory: Positive  cough and shortness of breath.   Cardiovascular: Denies chest pain, chest tightness, palpitations or swelling in the hands or feet.   No other specific complaints in a complete review of systems (except as listed in HPI above).  Objective:   BP 142/88  Pulse 128  Temp(Src) 98.7 F (37.1 C) (Oral)  Ht 5\' 2"  (1.575 m)  Wt 162 lb (73.483 kg)  BMI 29.62 kg/m2  SpO2 89% Wt Readings from Last 3 Encounters:  01/21/13 162 lb (73.483 kg)  07/30/12 160 lb (72.576 kg)  06/08/12 158 lb (71.668 kg)     General: Appears her stated age, well developed, well nourished. HEENT: Head: normal shape and size; Eyes: sclera white, no icterus, conjunctiva pink, PERRLA and EOMs intact; Ears: Tm's gray and intact, normal light reflex; Nose: mucosa pink and moist, septum midline; Throat/Mouth: + PND. Teeth present, mucosa erythematous and moist, no exudate noted, no lesions or ulcerations noted.  Neck: Mild cervical lymphadenopathy. Neck supple, trachea midline. No massses, lumps or thyromegaly present.  Cardiovascular: Tachycardic. S1,S2 noted.  No murmur, rubs or gallops noted. No JVD or BLE edema. No carotid bruits noted. Pulmonary/Chest: Normal effort and bilateral inspiratory and expiratory wheezing and scattered rhonchi.  Mild respiratory distress.       Assessment & Plan:   Acute COPD exacerbation, new onset with additional workup required:  Nebulizer treatment in office today 120 mg Depo Medrol IM eRx for prednisone taper eRx for Levaquin x 7 days Chest xray to r/o pneumonia  RTC as needed or if symptoms persist.

## 2013-01-25 ENCOUNTER — Telehealth: Payer: Self-pay | Admitting: Internal Medicine

## 2013-01-25 DIAGNOSIS — J441 Chronic obstructive pulmonary disease with (acute) exacerbation: Secondary | ICD-10-CM

## 2013-01-25 NOTE — Telephone Encounter (Signed)
Refill levaquin for 14 d total pls Prom-cod syr ROV this week  Thx

## 2013-01-25 NOTE — Telephone Encounter (Signed)
Caller: Pam Gamble/Patient; Phone: [phone]; Reason for Call: Office called pt to give results of the xray that showed pneumonia.  Pt was told to continue medications and call the office today (01/25/13).  Pt states that she is still having chest pain and a cough.  Pt states that she has 2 more doses of Levaquin and 1 day left of the Prednisone.  Pt states her breathing is getting some better but the cough/pain is not improved.  Pt is afebrile. OFFICE PLEASE FOLLOW UP WITH PT

## 2013-01-26 ENCOUNTER — Telehealth: Payer: Self-pay | Admitting: Internal Medicine

## 2013-01-26 DIAGNOSIS — J441 Chronic obstructive pulmonary disease with (acute) exacerbation: Secondary | ICD-10-CM

## 2013-01-26 NOTE — Telephone Encounter (Signed)
Ok to ref Levaquin ROV Thx

## 2013-01-26 NOTE — Telephone Encounter (Signed)
Caller: Patty/Mother; Phone: [phone]; Reason for Call: Patient states she was advised to continue medication and call back with update.  Pt reports update given yesterday 01/25/13, she is feeling better but not completely well and only has 1 dose of antibiotics left.  PLEASE F/U WITH PT TO ADVISE.  THANK YOU.

## 2013-01-27 MED ORDER — LEVOFLOXACIN 500 MG PO TABS: 500.0000 mg | ORAL_TABLET | Freq: Every day | ORAL | Status: DC

## 2013-01-27 NOTE — Telephone Encounter (Signed)
Rx for Levaquin sent to [facility], appointment scheduled 01/28/2013 at 10:15am.

## 2013-01-28 ENCOUNTER — Encounter: Payer: Self-pay | Admitting: Internal Medicine

## 2013-01-28 ENCOUNTER — Ambulatory Visit (INDEPENDENT_AMBULATORY_CARE_PROVIDER_SITE_OTHER): Payer: Medicare Other | Admitting: Internal Medicine

## 2013-01-28 VITALS — BP 120/80 | HR 93 | Temp 97.4°F | Wt 160.6 lb

## 2013-01-28 DIAGNOSIS — Z23 Encounter for immunization: Secondary | ICD-10-CM

## 2013-01-28 DIAGNOSIS — J4489 Other specified chronic obstructive pulmonary disease: Secondary | ICD-10-CM

## 2013-01-28 DIAGNOSIS — J449 Chronic obstructive pulmonary disease, unspecified: Secondary | ICD-10-CM

## 2013-01-28 DIAGNOSIS — E119 Type 2 diabetes mellitus without complications: Secondary | ICD-10-CM

## 2013-01-28 DIAGNOSIS — R059 Cough, unspecified: Secondary | ICD-10-CM

## 2013-01-28 DIAGNOSIS — J441 Chronic obstructive pulmonary disease with (acute) exacerbation: Secondary | ICD-10-CM

## 2013-01-28 MED ORDER — TUDORZA PRESSAIR 400 MCG/ACT IN AEPB: 1.0000 | INHALATION_SPRAY | Freq: Two times a day (BID) | RESPIRATORY_TRACT | Status: DC

## 2013-01-28 MED ORDER — ROFLUMILAST 500 MCG PO TABS: 500.0000 ug | ORAL_TABLET | Freq: Every day | ORAL | Status: DC

## 2013-01-28 MED ORDER — FLUTICASONE-SALMETEROL 500-50 MCG/DOSE IN AEPB: 1.0000 | INHALATION_SPRAY | Freq: Two times a day (BID) | RESPIRATORY_TRACT | Status: DC

## 2013-01-28 MED ORDER — PREDNISONE 10 MG PO TABS: ORAL_TABLET | ORAL | Status: DC

## 2013-01-28 NOTE — Progress Notes (Signed)
Patient ID: Pam Gamble, female   DOB: [birth year], 67 y.o.   MRN: [id number] HPI  Pt presents to the clinic today with c/o shortness of breath and wheezing for the past 3 wks She does have COPD but states that her breathing has gotten worse. She was dx'd w/R PNA - on Levaquin. She has been using her nebulizer and inhalers at home but they are not providing much relief. She does have a dry cough. She denies fever, chills or body aches. She has not taken anything OTC. She does not smoke.  Review of Systems      Past Medical History  Diagnosis Date  . Calcium deposits in tendon and bursa   . Idiopathic thrombocythemia   . Esophageal reflux   . Personal history of malignant neoplasm of other parts of uterus   . Lateral epicondylitis  of elbow   . Personal history of other diseases of digestive system   . Barrett's esophagus   . Chronic airway obstruction, not elsewhere classified   . Diabetes mellitus   . Depression     Family History  Problem Relation Age of Onset  . Liver cancer Sister   . Cancer Sister     pancreas  . Stroke Other   . Heart disease Mother     CHF  . Heart disease Father     MI    History   Social History  . Marital Status: Single    Spouse Name: N/A    Number of Children: N/A  . Years of Education: N/A   Occupational History  . 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 Smoking status: Former Smoker    Quit date: 11/11/2004  . Smokeless tobacco: Not on file  . Alcohol Use: No  . Drug Use: No  . Sexually Active: Not on file   Other Topics Concern  . Not on file   Social History Narrative  . No narrative on file    Allergies  Allergen Reactions  . Amoxicillin-Pot Clavulanate     REACTION: diarrhea  . Penicillins     REACTION: nausea     Constitutional: Positive fatigue. Denies headache, fever or abrupt weight changes.  HEENT:   Denies eye redness, eye pain, pressure behind the eyes, facial pain,  nasal congestion, ear pain, ringing in the ears, wax buildup, runny nose or bloody nose. Respiratory: Positive cough and shortness of breath.   Cardiovascular: Denies chest pain, chest tightness, palpitations or swelling in the hands or feet.   No other specific complaints in a complete review of systems (except as listed in HPI above).  Objective:   BP 120/80  Pulse 93  Temp(Src) 97.4 F (36.3 C) (Oral)  Wt 160 lb 9.6 oz (72.848 kg)  BMI 29.37 kg/m2  SpO2 96% Wt Readings from Last 3 Encounters:  01/28/13 160 lb 9.6 oz (72.848 kg)  01/21/13 162 lb (73.483 kg)  07/30/12 160 lb (72.576 kg)     General: Appears her stated age, well developed, well nourished. HEENT: Head: normal shape and size; Eyes: sclera white, no icterus, conjunctiva pink, PERRLA and EOMs intact; Ears: Tm's gray and intact, normal light reflex; Nose: mucosa pink and moist, septum midline; Throat/Mouth: + PND. Teeth present, mucosa erythematous and moist, no exudate noted, no lesions or ulcerations noted.  Neck: Mild cervical lymphadenopathy. Neck supple, trachea midline. No massses, lumps or thyromegaly present.  Cardiovascular: Tachycardic. S1,S2 noted.  No murmur, rubs or gallops  noted. No JVD or BLE edema. No carotid bruits noted. Pulmonary/Chest: Normal effort and bilateral inspiratory and expiratory wheezing and scattered rhonchi. No respiratory distress.  R crackles    I personally provided the Advair inhaler use teaching. After the teaching patient was able to demonstrate it's use effectively. All questions were answered  Assessment & Plan:

## 2013-01-28 NOTE — Assessment & Plan Note (Signed)
RLL PNA Finish abx Changed t Advair at pt's request Added Daliresp 1 a day

## 2013-01-28 NOTE — Assessment & Plan Note (Addendum)
Changed to Advair at the pt's request Added Daliresp

## 2013-01-28 NOTE — Assessment & Plan Note (Signed)
Changed to Advair at the pt's request Added Daliresp 

## 2013-01-28 NOTE — Assessment & Plan Note (Signed)
Continue with current prescription therapy as reflected on the Med list.  

## 2013-04-24 ENCOUNTER — Emergency Department (HOSPITAL_COMMUNITY): Payer: Medicare Other

## 2013-04-24 ENCOUNTER — Emergency Department (HOSPITAL_COMMUNITY)
Admission: EM | Admit: 2013-04-24 | Discharge: 2013-04-24 | Disposition: A | Discharge status: Home / Self Care | Payer: Medicare Other | Attending: Emergency Medicine | Admitting: Emergency Medicine

## 2013-04-24 ENCOUNTER — Encounter (HOSPITAL_COMMUNITY): Payer: Self-pay | Admitting: Family Medicine

## 2013-04-24 DIAGNOSIS — Z79899 Other long term (current) drug therapy: Secondary | ICD-10-CM | POA: Insufficient documentation

## 2013-04-24 DIAGNOSIS — K219 Gastro-esophageal reflux disease without esophagitis: Secondary | ICD-10-CM | POA: Insufficient documentation

## 2013-04-24 DIAGNOSIS — D473 Essential (hemorrhagic) thrombocythemia: Secondary | ICD-10-CM | POA: Insufficient documentation

## 2013-04-24 DIAGNOSIS — M545 Low back pain, unspecified: Secondary | ICD-10-CM | POA: Insufficient documentation

## 2013-04-24 DIAGNOSIS — Z8739 Personal history of other diseases of the musculoskeletal system and connective tissue: Secondary | ICD-10-CM | POA: Insufficient documentation

## 2013-04-24 DIAGNOSIS — IMO0002 Reserved for concepts with insufficient information to code with codable children: Secondary | ICD-10-CM | POA: Insufficient documentation

## 2013-04-24 DIAGNOSIS — Z9089 Acquired absence of other organs: Secondary | ICD-10-CM | POA: Insufficient documentation

## 2013-04-24 DIAGNOSIS — Z8719 Personal history of other diseases of the digestive system: Secondary | ICD-10-CM | POA: Insufficient documentation

## 2013-04-24 DIAGNOSIS — Z8542 Personal history of malignant neoplasm of other parts of uterus: Secondary | ICD-10-CM | POA: Insufficient documentation

## 2013-04-24 DIAGNOSIS — E119 Type 2 diabetes mellitus without complications: Secondary | ICD-10-CM | POA: Insufficient documentation

## 2013-04-24 DIAGNOSIS — Z880 Allergy status to penicillin: Secondary | ICD-10-CM | POA: Insufficient documentation

## 2013-04-24 DIAGNOSIS — J4489 Other specified chronic obstructive pulmonary disease: Secondary | ICD-10-CM | POA: Insufficient documentation

## 2013-04-24 DIAGNOSIS — R110 Nausea: Secondary | ICD-10-CM | POA: Insufficient documentation

## 2013-04-24 DIAGNOSIS — Z90710 Acquired absence of both cervix and uterus: Secondary | ICD-10-CM | POA: Insufficient documentation

## 2013-04-24 DIAGNOSIS — F3289 Other specified depressive episodes: Secondary | ICD-10-CM | POA: Insufficient documentation

## 2013-04-24 DIAGNOSIS — J449 Chronic obstructive pulmonary disease, unspecified: Secondary | ICD-10-CM | POA: Insufficient documentation

## 2013-04-24 DIAGNOSIS — F329 Major depressive disorder, single episode, unspecified: Secondary | ICD-10-CM | POA: Insufficient documentation

## 2013-04-24 DIAGNOSIS — Z87891 Personal history of nicotine dependence: Secondary | ICD-10-CM | POA: Insufficient documentation

## 2013-04-24 DIAGNOSIS — N200 Calculus of kidney: Secondary | ICD-10-CM | POA: Insufficient documentation

## 2013-04-24 LAB — URINALYSIS, ROUTINE W REFLEX MICROSCOPIC
Ketones, ur: NEGATIVE mg/dL
Nitrite: NEGATIVE
Protein, ur: 30 mg/dL — AB
Urobilinogen, UA: 0.2 mg/dL (ref 0.0–1.0)

## 2013-04-24 LAB — BASIC METABOLIC PANEL
BUN: 16 mg/dL (ref 6–23)
CO2: 26 mEq/L (ref 19–32)
Calcium: 9.8 mg/dL (ref 8.4–10.5)
Creatinine, Ser: 0.63 mg/dL (ref 0.50–1.10)
Glucose, Bld: 170 mg/dL — ABNORMAL HIGH (ref 70–99)

## 2013-04-24 LAB — CBC WITH DIFFERENTIAL/PLATELET
Basophils Absolute: 0.1 10*3/uL (ref 0.0–0.1)
Eosinophils Relative: 2 % (ref 0–5)
HCT: 41.1 % (ref 36.0–46.0)
Hemoglobin: 13.8 g/dL (ref 12.0–15.0)
Lymphocytes Relative: 25 % (ref 12–46)
Lymphs Abs: 4.3 10*3/uL — ABNORMAL HIGH (ref 0.7–4.0)
MCV: 91.5 fL (ref 78.0–100.0)
Monocytes Absolute: 1.4 10*3/uL — ABNORMAL HIGH (ref 0.1–1.0)
Monocytes Relative: 8 % (ref 3–12)
RDW: 13.3 % (ref 11.5–15.5)
WBC: 17.4 10*3/uL — ABNORMAL HIGH (ref 4.0–10.5)

## 2013-04-24 MED ORDER — MORPHINE SULFATE 4 MG/ML IJ SOLN
4.0000 mg | Freq: Once | INTRAMUSCULAR | Status: AC
  Administered 2013-04-24: 4 mg via INTRAVENOUS
  Filled 2013-04-24: qty 1

## 2013-04-24 MED ORDER — OXYCODONE-ACETAMINOPHEN 5-325 MG PO TABS: 2.0000 | ORAL_TABLET | ORAL | Status: DC | PRN

## 2013-04-24 MED ORDER — SULFAMETHOXAZOLE-TRIMETHOPRIM 800-160 MG PO TABS: 1.0000 | ORAL_TABLET | Freq: Two times a day (BID) | ORAL | Status: DC

## 2013-04-24 NOTE — ED Provider Notes (Signed)
Medical screening examination/treatment/procedure(s) were performed by non-physician practitioner and as supervising physician I was immediately available for consultation/collaboration.   Christopher J. Pollina, MD 04/24/13 1737 

## 2013-04-24 NOTE — ED Provider Notes (Signed)
History     CSN: [id number]  Arrival date & time 04/24/13  1259   First MD Initiated Contact with Patient 04/24/13 1436      Chief Complaint  Patient presents with  . Flank Pain  . Abdominal Pain    (Consider location/radiation/quality/duration/timing/severity/associated sxs/prior treatment) HPI Comments: Patient presents to the emergency department with chief complaint of right-sided flank and low back pain that radiates to the right lower abdomen. She states that his been ongoing for 3 days. She was seen by her primary care physician, who sent her to the emergency department for evaluation of kidney stone versus appendicitis. She denies any fever or vomiting, but states that she has had some nausea. She denies dysuria. She is tried taking Aleve with some relief. She states that her pain is 6/10. There is no change in her symptoms with movement. She states the pain is constant.  The history is provided by the patient. No language interpreter was used.    Past Medical History  Diagnosis Date  . Calcium deposits in tendon and bursa   . Idiopathic thrombocythemia   . Esophageal reflux   . Personal history of malignant neoplasm of other parts of uterus   . Lateral epicondylitis  of elbow   . Personal history of other diseases of digestive system   . Barrett's esophagus   . Chronic airway obstruction, not elsewhere classified   . Diabetes mellitus   . Depression     Past Surgical History  Procedure Laterality Date  . Tonsillectomy and adenoidectomy    . Abdominal hysterectomy    . Splenectomy      Family History  Problem Relation Age of Onset  . Liver cancer Sister   . Cancer Sister     pancreas  . Stroke Other   . Heart disease Mother     CHF  . Heart disease Father     MI    History  Substance Use Topics  . Smoking status: Former Smoker    Quit date: 11/11/2004  . Smokeless tobacco: Not on file  . Alcohol Use: No    OB History   Grav Para Term Preterm  Abortions TAB SAB Ect Mult Living                  Review of Systems  All other systems reviewed and are negative.    Allergies  Amoxicillin-pot clavulanate and Penicillins  Home Medications   Current Outpatient Rx  Name  Route  Sig  Dispense  Refill  . ACCU-CHEK FASTCLIX LANCETS MISC   Does not apply   1 Units by Does not apply route 2 (two) times daily.   100 each   11   . ACCU-CHEK SMARTVIEW test strip      USE AS DIRECTED   100 strip   4   . azelastine (ASTELIN) 137 MCG/SPRAY nasal spray   Nasal   Place 2 sprays into the nose 2 (two) times daily. Use in each nostril as directed   30 mL   3   . B Complex-C-Folic Acid (STRESS B COMPLEX PO)   Oral   Take 1 capsule by mouth daily.         . cholecalciferol (VITAMIN D) 1000 UNITS tablet   Oral   Take 5,000 Units by mouth daily.         . fluconazole (DIFLUCAN) 150 MG tablet   Oral   Take 1 tablet (150 mg total) by mouth once.  1 tablet   0   . Fluticasone-Salmeterol (ADVAIR DISKUS) 500-50 MCG/DOSE AEPB   Inhalation   Inhale 1 puff into the lungs 2 (two) times daily.   60 each   11   . GINKGO BILOBA COMPLEX PO   Oral   Take 1 capsule by mouth daily.         Marland Kitchen ipratropium-albuterol (DUONEB) 0.5-2.5 (3) MG/3ML SOLN   Nebulization   Take 3 mLs by nebulization 4 (four) times daily as needed.   360 mL   11   . levofloxacin (LEVAQUIN) 500 MG tablet   Oral   Take 1 tablet (500 mg total) by mouth daily.   7 tablet   0   . omeprazole (PRILOSEC) 40 MG capsule   Oral   Take 1 capsule (40 mg total) by mouth daily.   90 capsule   3   . predniSONE (DELTASONE) 10 MG tablet      Take 3 tablets on day 1-2, take 2 tablets on day 3-4, take 1 on days 5-6   20 tablet   0   . PROVENTIL HFA 108 (90 BASE) MCG/ACT inhaler      INHALE 2 PUFS INTO THE LUNGS EVERY 4 HOURS AS NEEDED   6.7 g   4   . roflumilast (DALIRESP) 500 MCG TABS tablet   Oral   Take 1 tablet (500 mcg total) by mouth daily.   30  tablet   11   . sitaGLIPtan-metformin (JANUMET) 50-1000 MG per tablet   Oral   Take 1 tablet by mouth 2 (two) times daily with a meal.   60 tablet   11     Yearly physical due in July must see md b4 additio ...   . TUDORZA PRESSAIR 400 MCG/ACT AEPB   Inhalation   Inhale 1 Act into the lungs 2 (two) times daily.   1 each   11     Dispense as written.     BP 156/66  Pulse 96  Temp(Src) 98.5 F (36.9 C)  Resp 22  SpO2 96%  Physical Exam  Nursing note and vitals reviewed. Constitutional: She is oriented to person, place, and time. She appears well-developed and well-nourished.  HENT:  Head: Normocephalic and atraumatic.  Eyes: Conjunctivae and EOM are normal. Pupils are equal, round, and reactive to light.  Neck: Normal range of motion. Neck supple.  Cardiovascular: Normal rate and regular rhythm.  Exam reveals no gallop and no friction rub.   No murmur heard. Pulmonary/Chest: Effort normal and breath sounds normal. No respiratory distress. She has no wheezes. She has no rales. She exhibits no tenderness.  Abdominal: Soft. Bowel sounds are normal. She exhibits no distension and no mass. There is tenderness. There is no rebound and no guarding.  Right-sided CVA tenderness, and right lower quadrant tenderness, no rebound tenderness, no fluid wave, or signs of peritonitis, no other focal abdominal tenderness  Musculoskeletal: Normal range of motion. She exhibits no edema and no tenderness.  Neurological: She is alert and oriented to person, place, and time.  Skin: Skin is warm and dry.  Psychiatric: She has a normal mood and affect. Her behavior is normal. Judgment and thought content normal.    ED Course  Procedures (including critical care time)  Labs Reviewed  CBC WITH DIFFERENTIAL - Abnormal; Notable for the following:    WBC 17.4 (*)    Platelets 505 (*)    Neutro Abs 11.1 (*)    Lymphs Abs 4.3 (*)  Monocytes Absolute 1.4 (*)    All other components within  normal limits  BASIC METABOLIC PANEL - Abnormal; Notable for the following:    Glucose, Bld 170 (*)    All other components within normal limits  URINALYSIS, ROUTINE W REFLEX MICROSCOPIC - Abnormal; Notable for the following:    APPearance CLOUDY (*)    Protein, ur 30 (*)    Leukocytes, UA TRACE (*)    All other components within normal limits  URINE MICROSCOPIC-ADD ON - Abnormal; Notable for the following:    Squamous Epithelial / LPF FEW (*)    Crystals CA OXALATE CRYSTALS (*)    All other components within normal limits   Results for orders placed during the hospital encounter of 04/24/13  CBC WITH DIFFERENTIAL      Result Value Range   WBC 17.4 (*) 4.0 - 10.5 K/uL   RBC 4.49  3.87 - 5.11 MIL/uL   Hemoglobin 13.8  12.0 - 15.0 g/dL   HCT 11.9  14.7 - 82.9 %   MCV 91.5  78.0 - 100.0 fL   MCH 30.7  26.0 - 34.0 pg   MCHC 33.6  30.0 - 36.0 g/dL   RDW 56.2  13.0 - 86.5 %   Platelets 505 (*) 150 - 400 K/uL   Neutrophils Relative % 64  43 - 77 %   Neutro Abs 11.1 (*) 1.7 - 7.7 K/uL   Lymphocytes Relative 25  12 - 46 %   Lymphs Abs 4.3 (*) 0.7 - 4.0 K/uL   Monocytes Relative 8  3 - 12 %   Monocytes Absolute 1.4 (*) 0.1 - 1.0 K/uL   Eosinophils Relative 2  0 - 5 %   Eosinophils Absolute 0.4  0.0 - 0.7 K/uL   Basophils Relative 0  0 - 1 %   Basophils Absolute 0.1  0.0 - 0.1 K/uL  BASIC METABOLIC PANEL      Result Value Range   Sodium 140  135 - 145 mEq/L   Potassium 4.4  3.5 - 5.1 mEq/L   Chloride 104  96 - 112 mEq/L   CO2 26  19 - 32 mEq/L   Glucose, Bld 170 (*) 70 - 99 mg/dL   BUN 16  6 - 23 mg/dL   Creatinine, Ser 7.84  0.50 - 1.10 mg/dL   Calcium 9.8  8.4 - 69.6 mg/dL   GFR calc non Af Amer >90  >90 mL/min   GFR calc Af Amer >90  >90 mL/min  URINALYSIS, ROUTINE W REFLEX MICROSCOPIC      Result Value Range   Color, Urine YELLOW  YELLOW   APPearance CLOUDY (*) CLEAR   Specific Gravity, Urine 1.027  1.005 - 1.030   pH 5.0  5.0 - 8.0   Glucose, UA NEGATIVE  NEGATIVE  mg/dL   Hgb urine dipstick NEGATIVE  NEGATIVE   Bilirubin Urine NEGATIVE  NEGATIVE   Ketones, ur NEGATIVE  NEGATIVE mg/dL   Protein, ur 30 (*) NEGATIVE mg/dL   Urobilinogen, UA 0.2  0.0 - 1.0 mg/dL   Nitrite NEGATIVE  NEGATIVE   Leukocytes, UA TRACE (*) NEGATIVE  URINE MICROSCOPIC-ADD ON      Result Value Range   Squamous Epithelial / LPF FEW (*) RARE   WBC, UA 0-2  <3 WBC/hpf   Crystals CA OXALATE CRYSTALS (*) NEGATIVE   Ct Abdomen Pelvis Wo Contrast  04/24/2013   *RADIOLOGY REPORT*  Clinical Data:  right flank pain  CT ABDOMEN AND PELVIS  WITHOUT CONTRAST  Technique:  Multidetector CT imaging of the abdomen and pelvis was performed following the standard protocol without intravenous contrast.  Comparison: 02/21/2011  Findings: Lung bases are unremarkable.  Small hiatal hernia. Sagittal images of the spine shows degenerative changes at L4-L5 level.  Multiple calcified gallstones are noted within gallbladder the largest measures 8 mm.  Unenhanced liver, pancreas, and adrenals are unremarkable.  There is mild right hydronephrosis and proximal right hydroureter.  At least three or four nonobstructive calcified calculi are noted within the right kidney the largest in upper pole measures 4.4 mm. Axial image 34 there is a 8 mm calcified obstructive calculus in proximal right ureter just below the UPJ level.  Mild right perinephric stranding.  No the left ureteral calcifications. Atherosclerotic calcifications of the abdominal aorta and the iliac arteries.  No pericecal inflammation.  The terminal ileum is unremarkable.  No calcified calculi are noted within urinary bladder.  The patient is status post hysterectomy.  The spleen is surgically absent.  IMPRESSION:  1.  There is  right nonobstructive nephrolithiasis. 2.  Right hydronephrosis.  There is a 8 mm calcified obstructive calculus in the proximal right ureter. 3.  Multiple gallstones are noted within gallbladder. 4.  Status post hysterectomy.    Original Report Authenticated By: Natasha Mead, M.D.      1. Kidney stone       MDM  Patient with right flank, right low back, and for pain, will order CT scan without contrast to evaluate for kidney stone versus appendicitis. Patient does have an elevated white blood cell count. However no fever, no vomiting. Will treat pain with morphine.  5:11 PM Patient feels better after morphine. States that her pain is 2/10. CT scan is remarkable for kidney stone. Discussed patient with Dr. Brunilda Payor from urology, who states the patient can be discharged to home with pain medicine and antibiotics. He was the patient in his office on Monday morning for lithotripsy. Discussed the plan with the patient, who understands and is agreeable. Patient advised to return to the emergency department if the pain worsens over the weekend.  Plan discussed with Dr. Blinda Leatherwood, who is in agreement.        Roxy Horseman, PA-C 04/24/13 1712

## 2013-04-24 NOTE — ED Notes (Addendum)
Pt in NAD. Pt instructed she cannot drive for another 3 hours due to having morphine IV. Pt sts "Ill be all right" RN explains law of driving under influence. Pt friend sts she will pick her up. Pt sts she will not drive home.

## 2013-04-24 NOTE — ED Notes (Signed)
Per pt sts 3 days of lower back pain, right flank and ride lower abdominal pain with nausea. sts pain has progressively worse.

## 2013-04-25 LAB — URINE CULTURE
Colony Count: NO GROWTH
Culture: NO GROWTH

## 2013-04-27 ENCOUNTER — Other Ambulatory Visit: Payer: Self-pay | Admitting: Urology

## 2013-04-27 ENCOUNTER — Ambulatory Visit (INDEPENDENT_AMBULATORY_CARE_PROVIDER_SITE_OTHER): Payer: Medicare Other

## 2013-04-27 DIAGNOSIS — J449 Chronic obstructive pulmonary disease, unspecified: Secondary | ICD-10-CM

## 2013-04-27 DIAGNOSIS — J441 Chronic obstructive pulmonary disease with (acute) exacerbation: Secondary | ICD-10-CM

## 2013-04-27 DIAGNOSIS — R05 Cough: Secondary | ICD-10-CM

## 2013-04-27 DIAGNOSIS — R059 Cough, unspecified: Secondary | ICD-10-CM

## 2013-04-27 LAB — URINALYSIS, ROUTINE W REFLEX MICROSCOPIC
Nitrite: NEGATIVE
Urobilinogen, UA: 0.2 (ref 0.0–1.0)

## 2013-04-27 LAB — CBC WITH DIFFERENTIAL/PLATELET
Basophils Absolute: 0.1 10*3/uL (ref 0.0–0.1)
Hemoglobin: 13.6 g/dL (ref 12.0–15.0)
Lymphocytes Relative: 41.5 % (ref 12.0–46.0)
Monocytes Relative: 7.7 % (ref 3.0–12.0)
Neutro Abs: 6.2 10*3/uL (ref 1.4–7.7)
Platelets: 444 10*3/uL — ABNORMAL HIGH (ref 150.0–400.0)
RDW: 13.5 % (ref 11.5–14.6)

## 2013-04-27 LAB — HEMOGLOBIN A1C: Hgb A1c MFr Bld: 10.3 % — ABNORMAL HIGH (ref 4.6–6.5)

## 2013-04-28 ENCOUNTER — Encounter (HOSPITAL_COMMUNITY): Payer: Self-pay | Admitting: *Deleted

## 2013-04-28 LAB — HEPATIC FUNCTION PANEL
AST: 22 U/L (ref 0–37)
Alkaline Phosphatase: 63 U/L (ref 39–117)
Total Bilirubin: 0.5 mg/dL (ref 0.3–1.2)

## 2013-04-28 LAB — LIPID PANEL
HDL: 40.6 mg/dL (ref >39.00)
Total CHOL/HDL Ratio: 6
Triglycerides: 208 mg/dL — ABNORMAL HIGH (ref 0.0–149.0)
VLDL: 41.6 mg/dL — ABNORMAL HIGH (ref 0.0–40.0)

## 2013-04-28 LAB — BASIC METABOLIC PANEL
Calcium: 9.8 mg/dL (ref 8.4–10.5)
GFR: 80.69 mL/min (ref >60.00)
Glucose, Bld: 179 mg/dL — ABNORMAL HIGH (ref 70–99)
Sodium: 136 mEq/L (ref 135–145)

## 2013-04-28 LAB — TSH: TSH: 0.93 u[IU]/mL (ref 0.35–5.50)

## 2013-04-29 ENCOUNTER — Encounter: Payer: Self-pay | Admitting: Internal Medicine

## 2013-04-29 ENCOUNTER — Ambulatory Visit (INDEPENDENT_AMBULATORY_CARE_PROVIDER_SITE_OTHER): Payer: Medicare Other | Admitting: Internal Medicine

## 2013-04-29 VITALS — BP 138/82 | HR 80 | Temp 98.1°F | Resp 16 | Wt 155.0 lb

## 2013-04-29 DIAGNOSIS — R059 Cough, unspecified: Secondary | ICD-10-CM

## 2013-04-29 DIAGNOSIS — J449 Chronic obstructive pulmonary disease, unspecified: Secondary | ICD-10-CM

## 2013-04-29 DIAGNOSIS — E119 Type 2 diabetes mellitus without complications: Secondary | ICD-10-CM

## 2013-04-29 DIAGNOSIS — R05 Cough: Secondary | ICD-10-CM

## 2013-04-29 MED ORDER — SITAGLIPTIN PHOS-METFORMIN HCL 50-1000 MG PO TABS: 1.0000 | ORAL_TABLET | Freq: Two times a day (BID) | ORAL | Status: DC

## 2013-04-29 MED ORDER — BUDESONIDE-FORMOTEROL FUMARATE 160-4.5 MCG/ACT IN AERO: 2.0000 | INHALATION_SPRAY | Freq: Two times a day (BID) | RESPIRATORY_TRACT | Status: DC

## 2013-04-29 MED ORDER — PIOGLITAZONE HCL 30 MG PO TABS: 30.0000 mg | ORAL_TABLET | Freq: Every day | ORAL | Status: DC

## 2013-04-29 NOTE — Progress Notes (Signed)
F/u kidney stone - needs lithotripsy - next Mon  The patient presents for a follow-up of  chronic hypertension, COPD, chronic dyslipidemia, type 2 diabetes    Review of Systems      Past Medical History  Diagnosis Date  . Calcium deposits in tendon and bursa   . Esophageal reflux   . Personal history of malignant neoplasm of other parts of uterus   . Lateral epicondylitis  of elbow   . Personal history of other diseases of digestive system   . Barrett's esophagus   . Chronic airway obstruction, not elsewhere classified   . Depression   . Shortness of breath     upon exertion  . Diabetes mellitus 2008ish    type two  . Chronic kidney disease     kidney stone  . Idiopathic thrombocythemia     Family History  Problem Relation Age of Onset  . Liver cancer Sister   . Cancer Sister     pancreas  . Stroke Other   . Heart disease Mother     CHF  . Heart disease Father     MI    History   Social History  . Marital Status: Single    Spouse Name: N/A    Number of Children: N/A  . Years of Education: N/A   Occupational History  . 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 Smoking status: Former Smoker    Quit date: 11/11/2004  . Smokeless tobacco: Not on file  . Alcohol Use: No  . Drug Use: No  . Sexually Active: Not on file   Other Topics Concern  . Not on file   Social History Narrative  . No narrative on file    Allergies  Allergen Reactions  . Amoxicillin-Pot Clavulanate Other (See Comments)    REACTION: diarrhea "sick to stomach"     Constitutional: Positive fatigue. Denies headache, fever or abrupt weight changes.  HEENT:   Denies eye redness, eye pain, pressure behind the eyes, facial pain, nasal congestion, ear pain, ringing in the ears, wax buildup, runny nose or bloody nose. Respiratory: Positive cough and shortness of breath.   Cardiovascular: Denies chest pain, chest tightness, palpitations or swelling in  the hands or feet.   No other specific complaints in a complete review of systems (except as listed in HPI above).  Objective:   BP 138/82  Pulse 80  Temp(Src) 98.1 F (36.7 C) (Oral)  Resp 16  Wt 155 lb (70.308 kg)  BMI 28.34 kg/m2 Wt Readings from Last 3 Encounters:  04/29/13 155 lb (70.308 kg)  01/28/13 160 lb 9.6 oz (72.848 kg)  01/21/13 162 lb (73.483 kg)     General: Appears her stated age, well developed, well nourished. HEENT: Head: normal shape and size; Eyes: sclera white, no icterus, conjunctiva pink, PERRLA and EOMs intact; Ears: Tm's gray and intact, normal light reflex; Nose: mucosa pink and moist, septum midline; Throat/Mouth: + PND. Teeth present, mucosa erythematous and moist, no exudate noted, no lesions or ulcerations noted.  Neck: Mild cervical lymphadenopathy. Neck supple, trachea midline. No massses, lumps or thyromegaly present.  Cardiovascular: Tachycardic. S1,S2 noted.  No murmur, rubs or gallops noted. No JVD or BLE edema. No carotid bruits noted. Pulmonary/Chest: Normal effort and bilateral inspiratory and expiratory wheezing and scattered rhonchi. No respiratory distress.  R crackles     Assessment & Plan:

## 2013-05-03 ENCOUNTER — Ambulatory Visit (HOSPITAL_COMMUNITY): Payer: Medicare Other

## 2013-05-03 ENCOUNTER — Encounter (HOSPITAL_COMMUNITY): Payer: Self-pay | Admitting: *Deleted

## 2013-05-03 ENCOUNTER — Encounter (HOSPITAL_COMMUNITY)
Admission: RE | Disposition: A | Discharge status: Home / Self Care | Payer: Self-pay | Source: Ambulatory Visit | Attending: Urology

## 2013-05-03 ENCOUNTER — Ambulatory Visit (HOSPITAL_COMMUNITY)
Admission: RE | Admit: 2013-05-03 | Discharge: 2013-05-03 | Disposition: A | Discharge status: Home / Self Care | Payer: Medicare Other | Source: Ambulatory Visit | Attending: Urology | Admitting: Urology

## 2013-05-03 DIAGNOSIS — E119 Type 2 diabetes mellitus without complications: Secondary | ICD-10-CM | POA: Insufficient documentation

## 2013-05-03 DIAGNOSIS — N1330 Unspecified hydronephrosis: Secondary | ICD-10-CM | POA: Insufficient documentation

## 2013-05-03 DIAGNOSIS — Z8542 Personal history of malignant neoplasm of other parts of uterus: Secondary | ICD-10-CM | POA: Insufficient documentation

## 2013-05-03 DIAGNOSIS — Z79899 Other long term (current) drug therapy: Secondary | ICD-10-CM | POA: Insufficient documentation

## 2013-05-03 DIAGNOSIS — Z90710 Acquired absence of both cervix and uterus: Secondary | ICD-10-CM | POA: Insufficient documentation

## 2013-05-03 DIAGNOSIS — Z8249 Family history of ischemic heart disease and other diseases of the circulatory system: Secondary | ICD-10-CM | POA: Insufficient documentation

## 2013-05-03 DIAGNOSIS — J4489 Other specified chronic obstructive pulmonary disease: Secondary | ICD-10-CM | POA: Insufficient documentation

## 2013-05-03 DIAGNOSIS — N201 Calculus of ureter: Secondary | ICD-10-CM | POA: Insufficient documentation

## 2013-05-03 DIAGNOSIS — J449 Chronic obstructive pulmonary disease, unspecified: Secondary | ICD-10-CM | POA: Insufficient documentation

## 2013-05-03 HISTORY — DX: Shortness of breath: R06.02

## 2013-05-03 SURGERY — LITHOTRIPSY, ESWL
Anesthesia: LOCAL | Laterality: Right

## 2013-05-03 MED ORDER — DEXTROSE-NACL 5-0.45 % IV SOLN
INTRAVENOUS | Status: DC
  Administered 2013-05-03: 11:00:00 via INTRAVENOUS

## 2013-05-03 MED ORDER — DIPHENHYDRAMINE HCL 25 MG PO CAPS
25.0000 mg | ORAL_CAPSULE | ORAL | Status: AC
  Administered 2013-05-03: 25 mg via ORAL
  Filled 2013-05-03: qty 1

## 2013-05-03 MED ORDER — CIPROFLOXACIN HCL 500 MG PO TABS
500.0000 mg | ORAL_TABLET | ORAL | Status: AC
  Administered 2013-05-03: 500 mg via ORAL
  Filled 2013-05-03: qty 1

## 2013-05-03 MED ORDER — DIAZEPAM 5 MG PO TABS
10.0000 mg | ORAL_TABLET | ORAL | Status: AC
  Administered 2013-05-03: 10 mg via ORAL
  Filled 2013-05-03: qty 2

## 2013-05-03 NOTE — Op Note (Signed)
Refer to [facility] Op Note scanned in the chart 

## 2013-05-03 NOTE — H&P (Signed)
History of Present Illness  Pam Gamble was seen in the ER last Saturday with severe right flank pain radiating to the right lower quadrant associated with nausea and vomiting.  She started having pain last Thursday but the pain subsided after she took some Aleve. It recurred Friday and was constant.  She then decided to go the ER.  CT scan showed mild left hydronephrosis, right non obstructing renal calculi and a 8 mm UPJ calculus.  She was discharged home on oral analgesics.  She has been doing fairly well since. She has mild right lower quadrant discomfort.  She does not have any voiding symptoms.  She does not hav a history of kidney stone.   Past Medical History Problems  1. History of  Anxiety (Symptom) 300.00 2. History of  Chronic Obstructive Pulmonary Disease 496 3. History of  Diabetes Mellitus 250.00 4. History of  Uterine Cancer V10.42  Surgical History Problems  1. History of  Hysterectomy V45.77 2. History of  Splenectomy  Current Meds 1. Advair HFA AERO; Therapy: (Recorded:16Jun2014) to 2. Albuterol Sulfate HFA 108 MCG/ACT AERS; Therapy: (Recorded:16Jun2014) to 3. Astelin 137 MCG/SPRAY SOLN; Therapy: (Recorded:16Jun2014) to 4. Daliresp TABS; Therapy: (Recorded:16Jun2014) to 5. Fluticasone Propionate 50 MCG/ACT Nasal Suspension; Therapy: (Recorded:16Jun2014) to 6. Ginkgo Biloba CAPS; Therapy: (Recorded:16Jun2014) to 7. Ipratropium-Albuterol 0.5-2.5 (3) MG/3ML Inhalation Solution; Therapy: (Recorded:16Jun2014) to 8. Janumet TABS; Therapy: (Recorded:16Jun2014) to 9. Naproxen Sodium TBCR; Therapy: (Recorded:16Jun2014) to 10. Oxycodone-Acetaminophen 5-325 MG Oral Tablet; Therapy: (Recorded:16Jun2014) to 11. PriLOSEC OTC TBEC; Therapy: (Recorded:16Jun2014) to 12. Septra DS 800-160 MG TABS; Therapy: (Recorded:16Jun2014) to 13. Stress B Complex TABS; Therapy: (Recorded:16Jun2014) to 14. Tudorza Pressair AEPB; Therapy: (Recorded:16Jun2014) to 15. Vitamin D3 5000 UNIT Oral Tablet;  Therapy: (Recorded:16Jun2014) to  Allergies Medication  1. Amoxicillin TABS  Family History Problems  1. Paternal history of  Acute Myocardial Infarction V17.3 2. Sororal history of  Cancer 3. Maternal history of  Congestive Heart Failure 4. Paternal history of  Diabetes Mellitus V18.0 5. Family history of  Father Deceased At Age 38 6. Maternal history of  Hypertension V17.49 7. Family history of  Mother Deceased At Age 40 Denied  8. Family history of  Family Health Status Number Of Children  Social History Problems    Alcohol Use occasionally   Caffeine Use 4 per day   Former Smoker V15.82 smoked x 36 yrs   Marital History - Single   Occupation: Retired  Review of Systems Genitourinary, constitutional, skin, eye, otolaryngeal, hematologic/lymphatic, cardiovascular, pulmonary, endocrine, musculoskeletal, gastrointestinal, neurological and psychiatric system(s) were reviewed and pertinent findings if present are noted.  Genitourinary: urinary frequency, dysuria, nocturia and incontinence.  Gastrointestinal: nausea, vomiting and diarrhea.  Hematologic/Lymphatic: a tendency to easily bruise.  Respiratory: shortness of breath and cough.    Vitals Vital Signs [Data Includes: Last 1 Day]  16Jun2014 01:06PM  BMI Calculated: 28.34 BSA Calculated: 1.71 Height: 5 ft 2 in Weight: 154 lb  Blood Pressure: 124 / 69 Heart Rate: 90  Physical Exam Constitutional: Well nourished and well developed . No acute distress.  ENT:. The ears and nose are normal in appearance.  Neck: The appearance of the neck is normal and no neck mass is present.  Pulmonary: No respiratory distress and normal respiratory rhythm and effort.  Cardiovascular: Heart rate and rhythm are normal . No peripheral edema.  Abdomen: The abdomen is soft and nontender. No masses are palpated. No CVA tenderness. No hernias are palpable. No hepatosplenomegaly noted.  Genitourinary:  The bladder is non  tender and  not distended.  Lymphatics: The femoral and inguinal nodes are not enlarged or tender.  Skin: Normal skin turgor, no visible rash and no visible skin lesions.  Neuro/Psych:. Mood and affect are appropriate.    Results/Data Urine [Data Includes: Last 1 Day]   16Jun2014  COLOR YELLOW   APPEARANCE CLEAR   SPECIFIC GRAVITY 1.020   pH 5.5   GLUCOSE NEG mg/dL  BILIRUBIN NEG   KETONE TRACE mg/dL  BLOOD LARGE   PROTEIN TRACE mg/dL  UROBILINOGEN 0.2 mg/dL  NITRITE NEG   LEUKOCYTE ESTERASE NEG   SQUAMOUS EPITHELIAL/HPF RARE   WBC 0-2 WBC/hpf  RBC 7-10 RBC/hpf  BACTERIA FEW   CRYSTALS SEE NOTE   CASTS NONE SEEN     I independently reviewed the CT scan and the findings are as noted above.  She also has cholelithiasis.   Assessment Assessed  1. Proximal Ureteral Stone On The Right 592.1 2. Nephrolithiasis Of The Right Kidney 592.0 3. Hydronephrosis On The Right 591  Plan Health Maintenance (V70.0)  1. UA With REFLEX  Done: 16Jun2014 12:22PM   I discussed the treatment options with Pam Lawlor: ESL, ureteroscopy or PCNL.  I told her that ESL is the least invasive of those procedures.  The risks of ESL include but are not limited to hemorrhage, renal or perirenal hematoma, injury to adjacent organs, steinstrasse, inability to fragment the stone.  She understands and wishes to proceed.   Signatures Electronically signed by : Su Grand, M.D.; Apr 26 2013  5:51PM

## 2013-05-03 NOTE — Progress Notes (Signed)
Returned form [employer] truck and assisted to w/c softball sized pink area with few pin point blood blisters

## 2013-05-24 ENCOUNTER — Encounter: Payer: Self-pay | Admitting: Internal Medicine

## 2013-05-24 NOTE — Assessment & Plan Note (Signed)
See meds 

## 2013-05-24 NOTE — Assessment & Plan Note (Signed)
Continue with current prescription therapy as reflected on the Med list. F/u w/Endocrinology

## 2013-06-16 ENCOUNTER — Other Ambulatory Visit: Payer: Self-pay

## 2013-07-27 ENCOUNTER — Other Ambulatory Visit: Payer: Self-pay | Admitting: *Deleted

## 2013-07-27 MED ORDER — SITAGLIPTIN PHOS-METFORMIN HCL 50-1000 MG PO TABS: 1.0000 | ORAL_TABLET | Freq: Two times a day (BID) | ORAL | Status: DC

## 2013-07-30 ENCOUNTER — Other Ambulatory Visit: Payer: Medicare Other

## 2013-07-30 ENCOUNTER — Ambulatory Visit: Payer: Medicare Other | Admitting: Internal Medicine

## 2013-08-03 ENCOUNTER — Ambulatory Visit: Payer: Medicare Other | Admitting: Internal Medicine

## 2013-08-06 ENCOUNTER — Ambulatory Visit (INDEPENDENT_AMBULATORY_CARE_PROVIDER_SITE_OTHER): Payer: Medicare Other | Admitting: Internal Medicine

## 2013-08-06 ENCOUNTER — Encounter: Payer: Self-pay | Admitting: Internal Medicine

## 2013-08-06 VITALS — BP 148/86 | HR 80 | Temp 97.5°F | Resp 16 | Wt 160.0 lb

## 2013-08-06 DIAGNOSIS — J449 Chronic obstructive pulmonary disease, unspecified: Secondary | ICD-10-CM

## 2013-08-06 DIAGNOSIS — E119 Type 2 diabetes mellitus without complications: Secondary | ICD-10-CM

## 2013-08-06 DIAGNOSIS — F329 Major depressive disorder, single episode, unspecified: Secondary | ICD-10-CM

## 2013-08-06 DIAGNOSIS — Z23 Encounter for immunization: Secondary | ICD-10-CM

## 2013-08-06 MED ORDER — ALBUTEROL SULFATE HFA 108 (90 BASE) MCG/ACT IN AERS: 2.0000 | INHALATION_SPRAY | Freq: Four times a day (QID) | RESPIRATORY_TRACT | Status: DC | PRN

## 2013-08-06 NOTE — Progress Notes (Signed)
The patient presents for a follow-up of  chronic hypertension, COPD, chronic dyslipidemia, type 2 diabetes. Meds are too $$$ - not taking Janumet    Wt Readings from Last 3 Encounters:  08/06/13 160 lb (72.576 kg)  05/03/13 152 lb 6 oz (69.117 kg)  05/03/13 152 lb 6 oz (69.117 kg)        Past Medical History  Diagnosis Date  . Calcium deposits in tendon and bursa   . Esophageal reflux   . Personal history of malignant neoplasm of other parts of uterus   . Lateral epicondylitis  of elbow   . Personal history of other diseases of digestive system   . Barrett's esophagus   . Chronic airway obstruction, not elsewhere classified   . Depression   . Shortness of breath     upon exertion  . Diabetes mellitus 2008ish    type two  . Chronic kidney disease     kidney stone  . Idiopathic thrombocythemia     Family History  Problem Relation Age of Onset  . Liver cancer Sister   . Cancer Sister     pancreas  . Stroke Other   . Heart disease Mother     CHF  . Heart disease Father     MI    History   Social History  . Marital Status: Single    Spouse Name: N/A    Number of Children: N/A  . Years of Education: N/A   Occupational History  . 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 Smoking status: Former Smoker    Quit date: 11/11/2004  . Smokeless tobacco: Not on file  . Alcohol Use: No  . Drug Use: No  . Sexual Activity: Not on file   Other Topics Concern  . Not on file   Social History Narrative  . No narrative on file    Allergies  Allergen Reactions  . Amoxicillin-Pot Clavulanate Nausea Only and Other (See Comments)    REACTION: diarrhea "sick to stomach"     Constitutional: Positive fatigue. Denies headache, fever or abrupt weight changes.  HEENT:   Denies eye redness, eye pain, pressure behind the eyes, facial pain, nasal congestion, ear pain, ringing in the ears, wax buildup, runny nose or bloody  nose. Respiratory: Positive cough and shortness of breath.   Cardiovascular: Denies chest pain, chest tightness, palpitations or swelling in the hands or feet.   No other specific complaints in a complete review of systems (except as listed in HPI above).  Objective:   BP 148/86  Pulse 80  Temp(Src) 97.5 F (36.4 C) (Oral)  Resp 16  Wt 160 lb (72.576 kg)  BMI 29.26 kg/m2 Wt Readings from Last 3 Encounters:  08/06/13 160 lb (72.576 kg)  05/03/13 152 lb 6 oz (69.117 kg)  05/03/13 152 lb 6 oz (69.117 kg)     General: Appears her stated age, well developed, well nourished. HEENT: Head: normal shape and size; Eyes: sclera white, no icterus, conjunctiva pink, PERRLA and EOMs intact; Ears: Tm's gray and intact, normal light reflex; Nose: mucosa pink and moist, septum midline; Throat/Mouth: + PND. Teeth present, mucosa erythematous and moist, no exudate noted, no lesions or ulcerations noted.  Neck: Mild cervical lymphadenopathy. Neck supple, trachea midline. No massses, lumps or thyromegaly present.  Cardiovascular: Tachycardic. S1,S2 noted.  No murmur, rubs or gallops noted. No JVD or BLE edema. No carotid bruits noted. Pulmonary/Chest: Normal effort and bilateral inspiratory  and expiratory wheezing and scattered rhonchi. No respiratory distress.  R crackles     Assessment & Plan:

## 2013-08-06 NOTE — Assessment & Plan Note (Signed)
Continue with current prescription therapy as reflected on the Med list.  

## 2013-08-06 NOTE — Patient Instructions (Signed)
Gluten free trial (no wheat products) for 4-6 weeks. OK to use gluten-free bread and gluten-free pasta.  Milk free trial (no milk, ice cream, cheese and yogurt) for 4-6 weeks. OK to use almond, coconut, rice or soy milk. "Almond breeze" brand tastes good.  

## 2013-08-07 NOTE — Assessment & Plan Note (Signed)
Continue with current prescription therapy as reflected on the Med list. C/o med cost

## 2013-08-07 NOTE — Assessment & Plan Note (Signed)
Continue with current prescription therapy as reflected on the Med list.  

## 2013-08-31 ENCOUNTER — Telehealth: Payer: Self-pay | Admitting: Internal Medicine

## 2013-08-31 NOTE — Telephone Encounter (Signed)
Pt request for contour next blood test strips to be send into [facility] pharmacy in [place]. Pt stated the sample for the meter was given last ov. Please advise

## 2013-09-01 MED ORDER — GLUCOSE BLOOD VI STRP: 1.0000 | ORAL_STRIP | Freq: Every day | Status: DC | PRN

## 2013-09-01 NOTE — Telephone Encounter (Signed)
Done

## 2013-10-01 ENCOUNTER — Other Ambulatory Visit: Payer: Self-pay | Admitting: *Deleted

## 2013-10-01 MED ORDER — PREDNISONE 10 MG PO TABS: ORAL_TABLET | ORAL | Status: DC

## 2013-10-01 NOTE — Telephone Encounter (Signed)
Ok to start Prednisone OV Sat Clinic Thx

## 2013-10-01 NOTE — Telephone Encounter (Signed)
Pt left vm c/o breathing issues and wheezing. She is requesting Rx for prednisone. Please advise.

## 2013-10-02 ENCOUNTER — Ambulatory Visit (INDEPENDENT_AMBULATORY_CARE_PROVIDER_SITE_OTHER): Payer: Medicare Other | Admitting: Family Medicine

## 2013-10-02 ENCOUNTER — Encounter: Payer: Self-pay | Admitting: Family Medicine

## 2013-10-02 VITALS — BP 126/80 | HR 90 | Temp 98.1°F | Resp 16 | Wt 162.0 lb

## 2013-10-02 DIAGNOSIS — J441 Chronic obstructive pulmonary disease with (acute) exacerbation: Secondary | ICD-10-CM

## 2013-10-02 MED ORDER — PREDNISONE 10 MG PO TABS: ORAL_TABLET | ORAL | Status: DC

## 2013-10-02 MED ORDER — HYDROCODONE-HOMATROPINE 5-1.5 MG/5ML PO SYRP: 5.0000 mL | ORAL_SOLUTION | Freq: Three times a day (TID) | ORAL | Status: DC | PRN

## 2013-10-02 NOTE — Progress Notes (Signed)
  Subjective:     Pam Gamble is a 67 y.o. female here for evaluation of a cough. Onset of symptoms was 3 days ago. Symptoms have been gradually worsening since that time. The cough is dry and is aggravated by cold air and reclining position. Associated symptoms include: shortness of breath and wheezing. Patient does not have a history of asthma. Patient does have a history of environmental allergens. Patient has not traveled recently. Patient does have a history of smoking. .  The following portions of the patient's history were reviewed and updated as appropriate: allergies, current medications, past family history, past medical history, past social history, past surgical history and problem list.  Review of Systems Pertinent items are noted in HPI.    Objective:    Oxygen saturation 95% on room air BP 126/80  Pulse 90  Temp(Src) 98.1 F (36.7 C) (Oral)  Resp 16  Wt 162 lb (73.483 kg)  SpO2 95% General appearance: alert, cooperative, appears stated age and no distress Nose: Nares normal. Septum midline. Mucosa normal. No drainage or sinus tenderness. Throat: lips, mucosa, and tongue normal; teeth and gums normal Neck: mild anterior cervical adenopathy, supple, symmetrical, trachea midline and thyroid not enlarged, symmetric, no tenderness/mass/nodules Lungs: wheezes bilaterally Heart: S1, S2 normal    Assessment:    COPD with exacerbation    Plan:    Explained lack of efficacy of antibiotics in viral disease. Antitussives per medication orders. Avoid exposure to tobacco smoke and fumes. B-agonist inhaler. Call if shortness of breath worsens, blood in sputum, change in character of cough, development of fever or chills, inability to maintain nutrition and hydration. Avoid exposure to tobacco smoke and fumes. pred taper per orders  F/u with pcp next week prn

## 2013-10-02 NOTE — Patient Instructions (Signed)

## 2013-10-06 ENCOUNTER — Telehealth: Payer: Self-pay | Admitting: Internal Medicine

## 2013-10-06 NOTE — Telephone Encounter (Signed)
The predisone is not helping.  She started taking it Sat.  She wants to add antibiotics.  She was seen Saturday.  Change her pharmacy back to [facility] in [place] [phone]).  Things don't go through to [facility].  Call her to let her know.

## 2013-10-06 NOTE — Telephone Encounter (Signed)
Pt informed PCP is out of office until Friday 10/08/13. Per office policy she will need to be seen to get antibiotics. I informed her someone can see her before Friday. She declines and requests message to be sent to PCP for Abx rx. Please advise.

## 2013-10-07 NOTE — Telephone Encounter (Signed)
OV on Fri Thx

## 2013-10-08 ENCOUNTER — Encounter: Payer: Self-pay | Admitting: Internal Medicine

## 2013-10-08 ENCOUNTER — Ambulatory Visit (INDEPENDENT_AMBULATORY_CARE_PROVIDER_SITE_OTHER): Payer: Medicare Other | Admitting: Internal Medicine

## 2013-10-08 ENCOUNTER — Other Ambulatory Visit: Payer: Self-pay | Admitting: *Deleted

## 2013-10-08 VITALS — BP 140/80 | HR 100 | Temp 98.4°F | Resp 16 | Wt 159.0 lb

## 2013-10-08 DIAGNOSIS — J449 Chronic obstructive pulmonary disease, unspecified: Secondary | ICD-10-CM

## 2013-10-08 DIAGNOSIS — J441 Chronic obstructive pulmonary disease with (acute) exacerbation: Secondary | ICD-10-CM

## 2013-10-08 MED ORDER — METHYLPREDNISOLONE ACETATE 80 MG/ML IJ SUSP
80.0000 mg | Freq: Once | INTRAMUSCULAR | Status: AC
  Administered 2013-10-08: 80 mg via INTRAMUSCULAR

## 2013-10-08 MED ORDER — SULFAMETHOXAZOLE-TRIMETHOPRIM 800-160 MG PO TABS: 1.0000 | ORAL_TABLET | Freq: Two times a day (BID) | ORAL | Status: DC

## 2013-10-08 NOTE — Assessment & Plan Note (Signed)
Cont Prednisone taper Start Bactrim IM Depomedrol 80 mg im

## 2013-10-08 NOTE — Progress Notes (Signed)
HPI  Pt presents to the clinic today with c/o shortness of breath and wheezing for the past 2 wks She does have COPD but states that her breathing has gotten worse. She took Prednisone. She saw Dr Laury Axon. She has been using her nebulizer and inhalers at home but they are not providing much relief. She does have a dry cough. She denies fever, chills or body aches. She does not smoke now.   Review of Systems      Past Medical History  Diagnosis Date  . Calcium deposits in tendon and bursa   . Esophageal reflux   . Personal history of malignant neoplasm of other parts of uterus   . Lateral epicondylitis  of elbow   . Personal history of other diseases of digestive system   . Barrett's esophagus   . Chronic airway obstruction, not elsewhere classified   . Depression   . Shortness of breath     upon exertion  . Diabetes mellitus 2008ish    type two  . Chronic kidney disease     kidney stone  . Idiopathic thrombocythemia     Family History  Problem Relation Age of Onset  . Liver cancer Sister   . Cancer Sister     pancreas  . Stroke Other   . Heart disease Mother     CHF  . Heart disease Father     MI    History   Social History  . Marital Status: Single    Spouse Name: N/A    Number of Children: N/A  . Years of Education: N/A   Occupational History  . 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 Smoking status: Former Smoker    Quit date: 11/11/2004  . Smokeless tobacco: Not on file  . Alcohol Use: No  . Drug Use: No  . Sexual Activity: Not on file   Other Topics Concern  . Not on file   Social History Narrative  . No narrative on file    Allergies  Allergen Reactions  . Amoxicillin-Pot Clavulanate Nausea Only and Other (See Comments)    REACTION: diarrhea "sick to stomach"     Constitutional: Positive fatigue. Denies headache, fever or abrupt weight changes.  HEENT:   Denies eye redness, eye pain, pressure behind  the eyes, facial pain, nasal congestion, ear pain, ringing in the ears, wax buildup, runny nose or bloody nose. Respiratory: Positive cough and shortness of breath.   Cardiovascular: Denies chest pain, chest tightness, palpitations or swelling in the hands or feet.   No other specific complaints in a complete review of systems (except as listed in HPI above).  Objective:   BP 140/80  Pulse 100  Temp(Src) 98.4 F (36.9 C) (Oral)  Resp 16  Wt 159 lb (72.122 kg) Wt Readings from Last 3 Encounters:  10/08/13 159 lb (72.122 kg)  10/02/13 162 lb (73.483 kg)  08/06/13 160 lb (72.576 kg)     General: Appears her stated age, well developed, well nourished. HEENT: Head: normal shape and size; Eyes: sclera white, no icterus, conjunctiva pink, PERRLA and EOMs intact; Ears: Tm's gray and intact, normal light reflex; Nose: mucosa pink and moist, septum midline; Throat/Mouth: + PND. Teeth present, mucosa erythematous and moist, no exudate noted, no lesions or ulcerations noted.  Neck: Mild cervical lymphadenopathy. Neck supple, trachea midline. No massses, lumps or thyromegaly present.  Cardiovascular: Tachycardic. S1,S2 noted.  No murmur, rubs or gallops noted. No JVD  or BLE edema. No carotid bruits noted. Pulmonary/Chest: Normal effort and bilateral inspiratory and expiratory wheezing and scattered rhonchi. No respiratory distress. B ronchi. Abd: S/NT  Assessment & Plan:

## 2013-10-08 NOTE — Progress Notes (Signed)
Pre visit review using our clinic review tool, if applicable. No additional management support is needed unless otherwise documented below in the visit note. 

## 2013-10-08 NOTE — Assessment & Plan Note (Signed)
Cont Prednisone taper Start Bactrim IM Depomedrol 80 mg im 

## 2013-10-08 NOTE — Telephone Encounter (Signed)
OV scheduled.  °

## 2013-11-09 ENCOUNTER — Ambulatory Visit: Payer: Medicare Other | Admitting: Internal Medicine

## 2013-11-16 ENCOUNTER — Telehealth: Payer: Self-pay | Admitting: Internal Medicine

## 2013-11-16 DIAGNOSIS — R3915 Urgency of urination: Secondary | ICD-10-CM

## 2013-11-16 NOTE — Telephone Encounter (Signed)
This has to go to doctor to put in referral

## 2013-11-16 NOTE — Telephone Encounter (Signed)
Patient is requesting a referral to Lowella Bandy MD with [facility] for [facility].  Patients next visit with [facility] is 12/27/2013 at 8:45.

## 2013-11-17 NOTE — Telephone Encounter (Signed)
Ok Thx 

## 2013-11-24 ENCOUNTER — Telehealth: Payer: Self-pay | Admitting: Internal Medicine

## 2013-11-24 DIAGNOSIS — N3289 Other specified disorders of bladder: Secondary | ICD-10-CM

## 2013-11-24 NOTE — Telephone Encounter (Signed)
Pt request referral for Steele Berg from [facility]. Pt has an appt 11/29/13. Please advise.

## 2013-11-25 NOTE — Telephone Encounter (Signed)
Done. Thx.

## 2013-12-21 ENCOUNTER — Ambulatory Visit (INDEPENDENT_AMBULATORY_CARE_PROVIDER_SITE_OTHER): Payer: Medicare HMO | Admitting: Internal Medicine

## 2013-12-21 ENCOUNTER — Telehealth: Payer: Self-pay | Admitting: *Deleted

## 2013-12-21 ENCOUNTER — Encounter: Payer: Self-pay | Admitting: Internal Medicine

## 2013-12-21 ENCOUNTER — Other Ambulatory Visit (INDEPENDENT_AMBULATORY_CARE_PROVIDER_SITE_OTHER): Payer: Medicare HMO

## 2013-12-21 ENCOUNTER — Ambulatory Visit (INDEPENDENT_AMBULATORY_CARE_PROVIDER_SITE_OTHER)
Admission: RE | Admit: 2013-12-21 | Discharge: 2013-12-21 | Disposition: A | Discharge status: Home / Self Care | Payer: Medicare HMO | Source: Ambulatory Visit | Attending: Internal Medicine | Admitting: Internal Medicine

## 2013-12-21 VITALS — BP 130/80 | HR 80 | Temp 98.0°F | Resp 16 | Ht 62.0 in | Wt 164.0 lb

## 2013-12-21 DIAGNOSIS — R059 Cough, unspecified: Secondary | ICD-10-CM

## 2013-12-21 DIAGNOSIS — J441 Chronic obstructive pulmonary disease with (acute) exacerbation: Secondary | ICD-10-CM

## 2013-12-21 DIAGNOSIS — E119 Type 2 diabetes mellitus without complications: Secondary | ICD-10-CM

## 2013-12-21 DIAGNOSIS — R05 Cough: Secondary | ICD-10-CM

## 2013-12-21 LAB — BASIC METABOLIC PANEL
BUN: 13 mg/dL (ref 6–23)
CALCIUM: 8.7 mg/dL (ref 8.4–10.5)
CO2: 27 mEq/L (ref 19–32)
CREATININE: 0.6 mg/dL (ref 0.4–1.2)
Chloride: 104 mEq/L (ref 96–112)
GFR: 107.86 mL/min (ref >60.00)
Glucose, Bld: 251 mg/dL — ABNORMAL HIGH (ref 70–99)
Potassium: 4.2 mEq/L (ref 3.5–5.1)
Sodium: 140 mEq/L (ref 135–145)

## 2013-12-21 LAB — HEMOGLOBIN A1C: HEMOGLOBIN A1C: 11 % — AB (ref 4.6–6.5)

## 2013-12-21 MED ORDER — SITAGLIPTIN PHOS-METFORMIN HCL 50-1000 MG PO TABS: 1.0000 | ORAL_TABLET | Freq: Two times a day (BID) | ORAL | Status: DC

## 2013-12-21 MED ORDER — METHYLPREDNISOLONE ACETATE 80 MG/ML IJ SUSP
120.0000 mg | Freq: Once | INTRAMUSCULAR | Status: AC
  Administered 2013-12-21: 120 mg via INTRAMUSCULAR

## 2013-12-21 NOTE — Assessment & Plan Note (Signed)
Her CXR is okay

## 2013-12-21 NOTE — Patient Instructions (Signed)
Chronic Obstructive Pulmonary Disease  Chronic obstructive pulmonary disease (COPD) is a common lung condition in which airflow from the lungs is limited. COPD is a general term that can be used to describe many different lung problems that limit airflow, including both chronic bronchitis and emphysema.  If you have COPD, your lung function will probably never return to normal, but there are measures you can take to improve lung function and make yourself feel better.   CAUSES   · Smoking (common).    · Exposure to secondhand smoke.    · Genetic problems.  · Chronic inflammatory lung diseases or recurrent infections.  SYMPTOMS   · Shortness of breath, especially with physical activity.    · Deep, persistent (chronic) cough with a large amount of thick mucus.    · Wheezing.    · Rapid breaths (tachypnea).    · Gray or bluish discoloration (cyanosis) of the skin, especially in fingers, toes, or lips.    · Fatigue.    · Weight loss.    · Frequent infections or episodes when breathing symptoms become much worse (exacerbations).    · Chest tightness.  DIAGNOSIS   Your healthcare provider will take a medical history and perform a physical examination to make the initial diagnosis.  Additional tests for COPD may include:   · Lung (pulmonary) function tests.  · Chest X-ray.  · CT scan.  · Blood tests.  TREATMENT   Treatment available to help you feel better when you have COPD include:   · Inhaler and nebulizer medicines. These help manage the symptoms of COPD and make your breathing more comfortable  · Supplemental oxygen. Supplemental oxygen is only helpful if you have a low oxygen level in your blood.    · Exercise and physical activity. These are beneficial for nearly all people with COPD. Some people may also benefit from a pulmonary rehabilitation program.  HOME CARE INSTRUCTIONS   · Take all medicines (inhaled or pills) as directed by your health care provider.  · Only take over-the-counter or prescription medicines  for pain, fever, or discomfort as directed by your health care provider.    · Avoid over-the-counter medicines or cough syrups that dry up your airway (such as antihistamines) and slow down the elimination of secretions unless instructed otherwise by your healthcare provider.    · If you are a smoker, the most important thing that you can do is stop smoking. Continuing to smoke will cause further lung damage and breathing trouble. Ask your health care provider for help with quitting smoking. He or she can direct you to community resources or hospitals that provide support.  · Avoid exposure to irritants such as smoke, chemicals, and fumes that aggravate your breathing.  · Use oxygen therapy and pulmonary rehabilitation if directed by your health care provider. If you require home oxygen therapy, ask your healthcare provider whether you should purchase a pulse oximeter to measure your oxygen level at home.    · Avoid contact with individuals who have a contagious illness.  · Avoid extreme temperature and humidity changes.  · Eat healthy foods. Eating smaller, more frequent meals and resting before meals may help you maintain your strength.  · Stay active, but balance activity with periods of rest. Exercise and physical activity will help you maintain your ability to do things you want to do.  · Preventing infection and hospitalization is very important when you have COPD. Make sure to receive all the vaccines your health care provider recommends, especially the pneumococcal and influenza vaccines. Ask your healthcare provider whether you   need a pneumonia vaccine.  · Learn and use relaxation techniques to manage stress.  · Learn and use controlled breathing techniques as directed by your health care provider. Controlled breathing techniques include:    · Pursed lip breathing. Start by breathing in (inhaling) through your nose for 1 second. Then, purse your lips as if you were going to whistle and breathe out (exhale)  through the pursed lips for 2 seconds.    · Diaphragmatic breathing. Start by putting one hand on your abdomen just above your waist. Inhale slowly through your nose. The hand on your abdomen should move out. Then purse your lips and exhale slowly. You should be able to feel the hand on your abdomen moving in as you exhale.    · Learn and use controlled coughing to clear mucus from your lungs. Controlled coughing is a series of short, progressive coughs. The steps of controlled coughing are:    1. Lean your head slightly forward.    2. Breathe in deeply using diaphragmatic breathing.    3. Try to hold your breath for 3 seconds.    4. Keep your mouth slightly open while coughing twice.    5. Spit any mucus out into a tissue.    6. Rest and repeat the steps once or twice as needed.  SEEK MEDICAL CARE IF:   · You are coughing up more mucus than usual.    · There is a change in the color or thickness of your mucus.    · Your breathing is more labored than usual.    · Your breathing is faster than usual.    SEEK IMMEDIATE MEDICAL CARE IF:   · You have shortness of breath while you are resting.    · You have shortness of breath that prevents you from:  · Being able to talk.    · Performing your usual physical activities.    · You have chest pain lasting longer than 5 minutes.    · Your skin color is more cyanotic than usual.  · You measure low oxygen saturations for longer than 5 minutes with a pulse oximeter.  MAKE SURE YOU:   · Understand these instructions.  · Will watch your condition.  · Will get help right away if you are not doing well or get worse.  Document Released: 08/07/2005 Document Revised: 08/18/2013 Document Reviewed: 06/24/2013  [employer]® Patient Information ©2014 [employer].

## 2013-12-21 NOTE — Progress Notes (Signed)
Subjective:    Patient ID: Pam Gamble, female    DOB: May 08, 1946, 68 y.o.   MRN: [id number]  Cough This is a new problem. The current episode started in the past 7 days. The problem has been unchanged. The cough is non-productive. Associated symptoms include shortness of breath and wheezing. Pertinent negatives include no chest pain, chills, ear congestion, ear pain, fever, headaches, heartburn, hemoptysis, myalgias, nasal congestion, postnasal drip, rash, rhinorrhea, sore throat, sweats or weight loss. The symptoms are aggravated by cold air. She has tried a beta-agonist inhaler, ipratropium inhaler, steroid inhaler and OTC cough suppressant for the symptoms. The treatment provided moderate relief. Her past medical history is significant for COPD. There is no history of asthma, bronchiectasis, bronchitis, emphysema, environmental allergies or pneumonia.      Review of Systems  Constitutional: Negative.  Negative for fever, chills, weight loss, diaphoresis, appetite change and fatigue.  HENT: Negative.  Negative for ear pain, postnasal drip, rhinorrhea and sore throat.   Eyes: Negative.   Respiratory: Positive for cough, shortness of breath and wheezing. Negative for hemoptysis.   Cardiovascular: Negative.  Negative for chest pain, palpitations and leg swelling.  Gastrointestinal: Negative.  Negative for heartburn, nausea, vomiting, abdominal pain, diarrhea, constipation and blood in stool.  Endocrine: Negative.  Negative for polydipsia, polyphagia and polyuria.  Genitourinary: Negative.   Musculoskeletal: Negative.  Negative for arthralgias and myalgias.  Skin: Negative.  Negative for rash.  Allergic/Immunologic: Negative.  Negative for environmental allergies.  Neurological: Negative.  Negative for dizziness, tremors, seizures, weakness, light-headedness and headaches.  Hematological: Negative.  Negative for adenopathy. Does not bruise/bleed easily.  Psychiatric/Behavioral: Negative.          Objective:   Physical Exam  Vitals reviewed. Constitutional: She is oriented to person, place, and time. She appears well-developed and well-nourished.  Non-toxic appearance. She does not have a sickly appearance. She does not appear ill. No distress.  HENT:  Head: Normocephalic and atraumatic.  Mouth/Throat: Oropharynx is clear and moist. No oropharyngeal exudate.  Eyes: Conjunctivae are normal. Right eye exhibits no discharge. Left eye exhibits no discharge. No scleral icterus.  Neck: Normal range of motion. Neck supple. No JVD present. No tracheal deviation present. No thyromegaly present.  Cardiovascular: Normal rate, regular rhythm, normal heart sounds and intact distal pulses.  Exam reveals no gallop and no friction rub.   No murmur heard. Pulmonary/Chest: Effort normal. No accessory muscle usage or stridor. Not tachypneic. No respiratory distress. She has no decreased breath sounds. She has wheezes in the right upper field, the right middle field, the right lower field, the left upper field, the left middle field and the left lower field. She has rhonchi in the right upper field, the right middle field, the right lower field, the left upper field, the left middle field and the left lower field. She has no rales. She exhibits no tenderness.  Abdominal: Soft. Bowel sounds are normal. She exhibits no distension and no mass. There is no tenderness. There is no rebound and no guarding.  Musculoskeletal: Normal range of motion. She exhibits no edema and no tenderness.  Lymphadenopathy:    She has no cervical adenopathy.  Neurological: She is oriented to person, place, and time.  Skin: Skin is warm and dry. No rash noted. She is not diaphoretic. No erythema. No pallor.  Psychiatric: She has a normal mood and affect. Her behavior is normal. Judgment and thought content normal.     Lab Results  Component Value  Date   WBC 13.0* 04/27/2013   HGB 13.6 04/27/2013   HCT 41.0 04/27/2013    PLT 444.0* 04/27/2013   GLUCOSE 179* 04/27/2013   CHOL 239* 04/27/2013   TRIG 208.0* 04/27/2013   HDL 40.60 04/27/2013   LDLDIRECT 187.4 04/27/2013   LDLCALC  Value: 136        Total Cholesterol/HDL:CHD Risk Coronary Heart Disease Risk Table                     Men   Women  1/2 Average Risk   3.4   3.3  Average Risk       5.0   4.4  2 X Average Risk   9.6   7.1  3 X Average Risk  23.4   11.0        Use the calculated Patient Ratio above and the CHD Risk Table to determine the patient's CHD Risk.        ATP III CLASSIFICATION (LDL):  <100     mg/dL   Optimal  100-129  mg/dL   Near or Above                    Optimal  130-159  mg/dL   Borderline  160-189  mg/dL   High  >190     mg/dL   Very High* 02/20/2011   ALT 22 04/27/2013   AST 22 04/27/2013   NA 136 04/27/2013   K 4.2 04/27/2013   CL 102 04/27/2013   CREATININE 0.8 04/27/2013   BUN 15 04/27/2013   CO2 24 04/27/2013   TSH 0.93 04/27/2013   PSA 0.02* 01/01/2008   HGBA1C 10.3* 04/27/2013       Assessment & Plan:

## 2013-12-21 NOTE — Telephone Encounter (Signed)
Thx

## 2013-12-21 NOTE — Assessment & Plan Note (Signed)
She is having a flare of symptoms so I gave her an injection of depo-medrol IM I have asked her to be more diligent with the use of her inhalers

## 2013-12-21 NOTE — Telephone Encounter (Signed)
Last OV with PCP 10/08/2013  Patient phoned triage line stating that his wheezing had increased and requesting an antibiotic.  Phoned and spoke with patient.  He was SOB when he answered the phone, which worsened thru the conversation. Consulted with Erline Levine, she said if PCP was full, any of the providers in the office could see patient.  Appt was made with Dr. Alona Bene at 1030 this morning for copd exac.

## 2013-12-21 NOTE — Assessment & Plan Note (Signed)
Her blood sugars are not well controlled, she tells me that she can;t afford the meds so I gave her samples of janumet

## 2013-12-21 NOTE — Progress Notes (Signed)
Pre visit review using our clinic review tool, if applicable. No additional management support is needed unless otherwise documented below in the visit note. 

## 2013-12-27 ENCOUNTER — Ambulatory Visit (INDEPENDENT_AMBULATORY_CARE_PROVIDER_SITE_OTHER): Payer: Medicare HMO | Admitting: Internal Medicine

## 2013-12-27 ENCOUNTER — Encounter: Payer: Self-pay | Admitting: Internal Medicine

## 2013-12-27 VITALS — BP 132/82 | HR 88 | Temp 97.0°F | Resp 16 | Wt 161.0 lb

## 2013-12-27 DIAGNOSIS — IMO0001 Reserved for inherently not codable concepts without codable children: Secondary | ICD-10-CM

## 2013-12-27 DIAGNOSIS — E1165 Type 2 diabetes mellitus with hyperglycemia: Secondary | ICD-10-CM

## 2013-12-27 DIAGNOSIS — J441 Chronic obstructive pulmonary disease with (acute) exacerbation: Secondary | ICD-10-CM

## 2013-12-27 DIAGNOSIS — IMO0002 Reserved for concepts with insufficient information to code with codable children: Secondary | ICD-10-CM | POA: Insufficient documentation

## 2013-12-27 DIAGNOSIS — E119 Type 2 diabetes mellitus without complications: Secondary | ICD-10-CM

## 2013-12-27 HISTORY — DX: Reserved for concepts with insufficient information to code with codable children: IMO0002

## 2013-12-27 HISTORY — DX: Type 2 diabetes mellitus with hyperglycemia: E11.65

## 2013-12-27 MED ORDER — INSULIN DETEMIR 100 UNIT/ML ~~LOC~~ SOLN: SUBCUTANEOUS | Status: DC

## 2013-12-27 MED ORDER — IPRATROPIUM-ALBUTEROL 0.5-2.5 (3) MG/3ML IN SOLN: 3.0000 mL | Freq: Four times a day (QID) | RESPIRATORY_TRACT | Status: DC | PRN

## 2013-12-27 MED ORDER — IPRATROPIUM-ALBUTEROL 0.5-2.5 (3) MG/3ML IN SOLN: 3.0000 mL | RESPIRATORY_TRACT | Status: DC | PRN

## 2013-12-27 MED ORDER — METFORMIN HCL 1000 MG PO TABS: 1000.0000 mg | ORAL_TABLET | Freq: Two times a day (BID) | ORAL | Status: DC

## 2013-12-27 NOTE — Assessment & Plan Note (Signed)
Better  

## 2013-12-27 NOTE — Assessment & Plan Note (Signed)
Levemir: Use 8 units daily for glucose 100-200 Use 10 units daily for glucose 201-250 Use 12 units daily for glucose 251-300 Use 14 units daily for glucose >351 Use 4 units daily for glucose <100

## 2013-12-27 NOTE — Progress Notes (Signed)
Pre visit review using our clinic review tool, if applicable. No additional management support is needed unless otherwise documented below in the visit note. 

## 2013-12-27 NOTE — Progress Notes (Signed)
HPI F/u high A1c--- 11.0  Pt presents to the clinic today for a f/u shortness of breath and wheezing for the past 2 wks She does have COPD but states that her breathing has gotten worse. She took Depomedrol. She saw Dr Ronnald Ramp. She has been using her nebulizer and inhalers at home but they are not providing much relief. She does have a dry cough. She denies fever, chills or body aches. She does not smoke now.   Review of Systems      Past Medical History  Diagnosis Date  . Calcium deposits in tendon and bursa   . Esophageal reflux   . Personal history of malignant neoplasm of other parts of uterus   . Lateral epicondylitis  of elbow   . Personal history of other diseases of digestive system   . Barrett's esophagus   . Chronic airway obstruction, not elsewhere classified   . Depression   . Shortness of breath     upon exertion  . Diabetes mellitus 2008ish    type two  . Chronic kidney disease     kidney stone  . Idiopathic thrombocythemia     Family History  Problem Relation Age of Onset  . Liver cancer Sister   . Cancer Sister     pancreas  . Stroke Other   . Heart disease Mother     CHF  . Heart disease Father     MI    History   Social History  . Marital Status: Single    Spouse Name: N/A    Number of Children: N/A  . 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 Smoking status: Former Smoker    Quit date: 11/11/2004  . Smokeless tobacco: Never Used  . Alcohol Use: No  . Drug Use: No  . Sexual Activity: Not Currently   Other Topics Concern  . Not on file   Social History Narrative  . No narrative on file    Allergies  Allergen Reactions  . Amoxicillin-Pot Clavulanate Nausea Only and Other (See Comments)    REACTION: diarrhea "sick to stomach"     Constitutional: Positive fatigue. Denies headache, fever or abrupt weight changes.  HEENT:   Denies eye redness, eye  pain, pressure behind the eyes, facial pain, nasal congestion, ear pain, ringing in the ears, wax buildup, runny nose or bloody nose. Respiratory: No cough and shortness of breath.   Cardiovascular: Denies chest pain, chest tightness, palpitations or swelling in the hands or feet.   No other specific complaints in a complete review of systems (except as listed in HPI above).  Objective:   BP 132/82  Pulse 88  Temp(Src) 97 F (36.1 C) (Oral)  Resp 16  Wt 161 lb (73.029 kg) Wt Readings from Last 3 Encounters:  12/27/13 161 lb (73.029 kg)  12/21/13 164 lb (74.39 kg)  10/08/13 159 lb (72.122 kg)     General: Appears her stated age, well developed, well nourished. HEENT: Head: normal shape and size; Eyes: sclera white, no icterus, conjunctiva pink, PERRLA and EOMs intact; Ears: Tm's gray and intact, normal light reflex; Nose: mucosa pink and moist, septum midline; Throat/Mouth: + PND. Teeth present, mucosa erythematous and moist, no exudate noted, no lesions or ulcerations noted.  Neck: Mild cervical lymphadenopathy. Neck supple, trachea midline. No massses, lumps or thyromegaly present.  Cardiovascular: Tachycardic. S1,S2 noted.  No murmur, rubs or gallops  noted. No JVD or BLE edema. No carotid bruits noted. Pulmonary/Chest: Normal effort and bilateral inspiratory and expiratory wheezing and scattered rhonchi. No respiratory distress. No ronchi. Abd: S/NT  Lab Results  Component Value Date   WBC 13.0* 04/27/2013   HGB 13.6 04/27/2013   HCT 41.0 04/27/2013   PLT 444.0* 04/27/2013   GLUCOSE 251* 12/21/2013   CHOL 239* 04/27/2013   TRIG 208.0* 04/27/2013   HDL 40.60 04/27/2013   LDLDIRECT 187.4 04/27/2013   LDLCALC  Value: 136        Total Cholesterol/HDL:CHD Risk Coronary Heart Disease Risk Table                     Men   Women  1/2 Average Risk   3.4   3.3  Average Risk       5.0   4.4  2 X Average Risk   9.6   7.1  3 X Average Risk  23.4   11.0        Use the calculated Patient Ratio above  and the CHD Risk Table to determine the patient's CHD Risk.        ATP III CLASSIFICATION (LDL):  <100     mg/dL   Optimal  100-129  mg/dL   Near or Above                    Optimal  130-159  mg/dL   Borderline  160-189  mg/dL   High  >190     mg/dL   Very High* 02/20/2011   ALT 22 04/27/2013   AST 22 04/27/2013   NA 140 12/21/2013   K 4.2 12/21/2013   CL 104 12/21/2013   CREATININE 0.6 12/21/2013   BUN 13 12/21/2013   CO2 27 12/21/2013   TSH 0.93 04/27/2013   PSA 0.02* 01/01/2008   HGBA1C 11.0* 12/21/2013     Assessment & Plan:

## 2013-12-27 NOTE — Patient Instructions (Signed)
Use 8 units daily for glucose 100-200 Use 10 units daily for glucose 201-250 Use 12 units daily for glucose 251-300 Use 14 units daily for glucose >351 Use 4 units daily for glucose <100

## 2013-12-28 NOTE — Assessment & Plan Note (Signed)
Levemir: Use 8 units daily for glucose 100-200 Use 10 units daily for glucose 201-250 Use 12 units daily for glucose 251-300 Use 14 units daily for glucose >351 Use 4 units daily for glucose <100  Cont Metformin

## 2013-12-31 ENCOUNTER — Telehealth: Payer: Self-pay

## 2013-12-31 NOTE — Telephone Encounter (Signed)
Relevant patient education assigned to patient using Emmi. ° °

## 2014-01-10 ENCOUNTER — Ambulatory Visit (INDEPENDENT_AMBULATORY_CARE_PROVIDER_SITE_OTHER): Payer: Medicare HMO | Admitting: Internal Medicine

## 2014-01-10 ENCOUNTER — Encounter: Payer: Self-pay | Admitting: Internal Medicine

## 2014-01-10 VITALS — BP 130/84 | Temp 98.8°F | Wt 161.0 lb

## 2014-01-10 DIAGNOSIS — IMO0001 Reserved for inherently not codable concepts without codable children: Secondary | ICD-10-CM

## 2014-01-10 DIAGNOSIS — J449 Chronic obstructive pulmonary disease, unspecified: Secondary | ICD-10-CM

## 2014-01-10 DIAGNOSIS — IMO0002 Reserved for concepts with insufficient information to code with codable children: Secondary | ICD-10-CM

## 2014-01-10 DIAGNOSIS — E1165 Type 2 diabetes mellitus with hyperglycemia: Secondary | ICD-10-CM

## 2014-01-10 DIAGNOSIS — R197 Diarrhea, unspecified: Secondary | ICD-10-CM

## 2014-01-10 DIAGNOSIS — E119 Type 2 diabetes mellitus without complications: Secondary | ICD-10-CM

## 2014-01-10 MED ORDER — OMEPRAZOLE 40 MG PO CPDR: 40.0000 mg | DELAYED_RELEASE_CAPSULE | Freq: Every day | ORAL | Status: DC

## 2014-01-10 MED ORDER — INSULIN DETEMIR 100 UNIT/ML ~~LOC~~ SOLN: SUBCUTANEOUS | Status: DC

## 2014-01-10 NOTE — Assessment & Plan Note (Signed)
Better Continue with current prescription therapy as reflected on the Med list.  

## 2014-01-10 NOTE — Progress Notes (Signed)
Pre visit review using our clinic review tool, if applicable. No additional management support is needed unless otherwise documented below in the visit note. 

## 2014-01-10 NOTE — Patient Instructions (Signed)
Levemir: Use 10 units daily for glucose 100-200  Use 12 units daily for glucose 201-250  Use 14 units daily for glucose 251-300  Use 16 units daily for glucose >351  Use 4 units daily for glucose <100

## 2014-01-10 NOTE — Progress Notes (Signed)
Patient ID: Pam Gamble, female   DOB: 1946-08-29, 68 y.o.   MRN: [id number] HPI F/u DM-2 - high A1c--- 11.0  Pt presents to the clinic today for a f/u shortness of breath and wheezing for the past 4 wks- better  She does have COPD but states that her breathing has gotten better She does not smoke now.   Review of Systems      Past Medical History  Diagnosis Date  . Calcium deposits in tendon and bursa   . Esophageal reflux   . Personal history of malignant neoplasm of other parts of uterus   . Lateral epicondylitis  of elbow   . Personal history of other diseases of digestive system   . Barrett's esophagus   . Chronic airway obstruction, not elsewhere classified   . Depression   . Shortness of breath     upon exertion  . Diabetes mellitus 2008ish    type two  . Chronic kidney disease     kidney stone  . Idiopathic thrombocythemia     Family History  Problem Relation Age of Onset  . Liver cancer Sister   . Cancer Sister     pancreas  . Stroke Other   . Heart disease Mother     CHF  . Heart disease Father     MI    History   Social History  . Marital Status: Single    Spouse Name: N/A    Number of Children: N/A  . 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 Smoking status: Former Smoker    Quit date: 11/11/2004  . Smokeless tobacco: Never Used  . Alcohol Use: No  . Drug Use: No  . Sexual Activity: Not Currently   Other Topics Concern  . Not on file   Social History Narrative  . No narrative on file    Allergies  Allergen Reactions  . Amoxicillin-Pot Clavulanate Nausea Only and Other (See Comments)    REACTION: diarrhea "sick to stomach"     Constitutional: Positive fatigue. Denies headache, fever or abrupt weight changes.  HEENT:   Denies eye redness, eye pain, pressure behind the eyes, facial pain, nasal congestion, ear pain, ringing in the ears, wax  buildup, runny nose or bloody nose. Respiratory: No cough and shortness of breath.   Cardiovascular: Denies chest pain, chest tightness, palpitations or swelling in the hands or feet.   No other specific complaints in a complete review of systems (except as listed in HPI above).  Objective:   BP 130/84  Temp(Src) 98.8 F (37.1 C) (Oral)  Wt 161 lb (73.029 kg) Wt Readings from Last 3 Encounters:  01/10/14 161 lb (73.029 kg)  12/27/13 161 lb (73.029 kg)  12/21/13 164 lb (74.39 kg)     General: Appears her stated age, well developed, well nourished. HEENT: Head: normal shape and size; Eyes: sclera white, no icterus, conjunctiva pink, PERRLA and EOMs intact; Ears: Tm's gray and intact, normal light reflex; Nose: mucosa pink and moist, septum midline; Throat/Mouth: + PND. Teeth present, mucosa erythematous and moist, no exudate noted, no lesions or ulcerations noted.  Neck: Mild cervical lymphadenopathy. Neck supple, trachea midline. No massses, lumps or thyromegaly present.  Cardiovascular: Tachycardic. S1,S2 noted.  No murmur, rubs or gallops noted. No JVD or BLE edema. No carotid bruits noted. Pulmonary/Chest: Normal effort and bilateral inspiratory and expiratory wheezing and scattered rhonchi. No  respiratory distress. No ronchi. Abd: S/NT  Lab Results  Component Value Date   WBC 13.0* 04/27/2013   HGB 13.6 04/27/2013   HCT 41.0 04/27/2013   PLT 444.0* 04/27/2013   GLUCOSE 251* 12/21/2013   CHOL 239* 04/27/2013   TRIG 208.0* 04/27/2013   HDL 40.60 04/27/2013   LDLDIRECT 187.4 04/27/2013   LDLCALC  Value: 136        Total Cholesterol/HDL:CHD Risk Coronary Heart Disease Risk Table                     Men   Women  1/2 Average Risk   3.4   3.3  Average Risk       5.0   4.4  2 X Average Risk   9.6   7.1  3 X Average Risk  23.4   11.0        Use the calculated Patient Ratio above and the CHD Risk Table to determine the patient's CHD Risk.        ATP III CLASSIFICATION (LDL):  <100     mg/dL    Optimal  100-129  mg/dL   Near or Above                    Optimal  130-159  mg/dL   Borderline  160-189  mg/dL   High  >190     mg/dL   Very High* 02/20/2011   ALT 22 04/27/2013   AST 22 04/27/2013   NA 140 12/21/2013   K 4.2 12/21/2013   CL 104 12/21/2013   CREATININE 0.6 12/21/2013   BUN 13 12/21/2013   CO2 27 12/21/2013   TSH 0.93 04/27/2013   PSA 0.02* 01/01/2008   HGBA1C 11.0* 12/21/2013     Assessment & Plan:

## 2014-01-10 NOTE — Assessment & Plan Note (Signed)
Better Cont Levemir - go up to 10 12 units

## 2014-01-10 NOTE — Assessment & Plan Note (Signed)
Resolved

## 2014-01-10 NOTE — Assessment & Plan Note (Signed)
See below

## 2014-01-11 ENCOUNTER — Encounter: Payer: Self-pay | Admitting: Internal Medicine

## 2014-01-11 ENCOUNTER — Telehealth: Payer: Self-pay | Admitting: Internal Medicine

## 2014-01-11 ENCOUNTER — Telehealth: Payer: Self-pay | Admitting: *Deleted

## 2014-01-11 MED ORDER — INSULIN DETEMIR 100 UNIT/ML FLEXPEN: PEN_INJECTOR | SUBCUTANEOUS | Status: DC

## 2014-01-11 NOTE — Telephone Encounter (Signed)
Phoned & notified patient of MD's script change.

## 2014-01-11 NOTE — Telephone Encounter (Signed)
Pt wants insulin in flexpen, not as a vial.

## 2014-01-11 NOTE — Telephone Encounter (Signed)
Relevant patient education assigned to patient using Emmi. ° °

## 2014-01-11 NOTE — Telephone Encounter (Signed)
Patient phoned & stated pharmacy filled his detemir as a vial and he is used to using the flexpen.  May I change it in [facility] & erx?  Please advise.  CB# [phone]

## 2014-01-11 NOTE — Telephone Encounter (Signed)
It is a "she". What is the problem? Thx

## 2014-01-11 NOTE — Telephone Encounter (Signed)
Ok. See Rx Thx 

## 2014-03-11 ENCOUNTER — Ambulatory Visit: Payer: Medicare HMO | Admitting: Internal Medicine

## 2014-03-21 ENCOUNTER — Encounter: Payer: Self-pay | Admitting: Internal Medicine

## 2014-03-22 ENCOUNTER — Other Ambulatory Visit: Payer: Self-pay | Admitting: Internal Medicine

## 2014-03-22 MED ORDER — PREDNISONE 10 MG PO TABS: ORAL_TABLET | ORAL | Status: DC

## 2014-04-15 ENCOUNTER — Encounter: Payer: Self-pay | Admitting: Internal Medicine

## 2014-04-15 ENCOUNTER — Ambulatory Visit (INDEPENDENT_AMBULATORY_CARE_PROVIDER_SITE_OTHER): Payer: Commercial Managed Care - HMO | Admitting: Internal Medicine

## 2014-04-15 VITALS — BP 140/84 | HR 76 | Temp 98.4°F | Resp 16 | Wt 161.0 lb

## 2014-04-15 DIAGNOSIS — J441 Chronic obstructive pulmonary disease with (acute) exacerbation: Secondary | ICD-10-CM

## 2014-04-15 DIAGNOSIS — IMO0001 Reserved for inherently not codable concepts without codable children: Secondary | ICD-10-CM

## 2014-04-15 DIAGNOSIS — IMO0002 Reserved for concepts with insufficient information to code with codable children: Secondary | ICD-10-CM

## 2014-04-15 DIAGNOSIS — R252 Cramp and spasm: Secondary | ICD-10-CM

## 2014-04-15 DIAGNOSIS — E1165 Type 2 diabetes mellitus with hyperglycemia: Secondary | ICD-10-CM

## 2014-04-15 DIAGNOSIS — J449 Chronic obstructive pulmonary disease, unspecified: Secondary | ICD-10-CM

## 2014-04-15 HISTORY — DX: Cramp and spasm: R25.2

## 2014-04-15 MED ORDER — INSULIN DETEMIR 100 UNIT/ML FLEXPEN: PEN_INJECTOR | SUBCUTANEOUS | Status: DC

## 2014-04-15 NOTE — Assessment & Plan Note (Signed)
Use Levemir [facility] program Levimir dose increased Pt declined labs

## 2014-04-15 NOTE — Patient Instructions (Addendum)
Tylenol pm for cramps Albuterol can give cramps Use Levemir [facility] program

## 2014-04-15 NOTE — Progress Notes (Signed)
C/o high CBGs: drinking Gatorade C/o cramps F/u DM-2: CBGs are high >200 at home - high A1c--- 11.0  Pt presents to the clinic today for a f/u shortness of breath and wheezing for the past 4 wks- better  She does have COPD but states that her breathing has gotten better She does not smoke now.   Review of Systems      Past Medical History  Diagnosis Date  . Calcium deposits in tendon and bursa   . Esophageal reflux   . Personal history of malignant neoplasm of other parts of uterus   . Lateral epicondylitis  of elbow   . Personal history of other diseases of digestive system   . Barrett's esophagus   . Chronic airway obstruction, not elsewhere classified   . Depression   . Shortness of breath     upon exertion  . Diabetes mellitus 2008ish    type two  . Chronic kidney disease     kidney stone  . Idiopathic thrombocythemia     Family History  Problem Relation Age of Onset  . Liver cancer Sister   . Cancer Sister     pancreas  . Stroke Other   . Heart disease Mother     CHF  . Heart disease Father     MI    History   Social History  . Marital Status: Single    Spouse Name: N/A    Number of Children: N/A  . 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 Smoking status: Former Smoker    Quit date: 11/11/2004  . Smokeless tobacco: Never Used  . Alcohol Use: No  . Drug Use: No  . Sexual Activity: Not Currently   Other Topics Concern  . Not on file   Social History Narrative  . No narrative on file    Allergies  Allergen Reactions  . Amoxicillin-Pot Clavulanate Nausea Only and Other (See Comments)    REACTION: diarrhea "sick to stomach"     Constitutional: Positive fatigue. Denies headache, fever or abrupt weight changes.  HEENT:   Denies eye redness, eye pain, pressure behind the eyes, facial pain, nasal congestion, ear pain, ringing in the ears, wax buildup, runny  nose or bloody nose. Respiratory: No cough and shortness of breath.   Cardiovascular: Denies chest pain, chest tightness, palpitations or swelling in the hands or feet.   No other specific complaints in a complete review of systems (except as listed in HPI above).  Objective:   BP 140/84  Pulse 76  Temp(Src) 98.4 F (36.9 C) (Oral)  Resp 16  Wt 161 lb (73.029 kg) Wt Readings from Last 3 Encounters:  04/15/14 161 lb (73.029 kg)  01/10/14 161 lb (73.029 kg)  12/27/13 161 lb (73.029 kg)     General: Appears her stated age, well developed, well nourished. HEENT: Head: normal shape and size; Eyes: sclera white, no icterus, conjunctiva pink, PERRLA and EOMs intact; Ears: Tm's gray and intact, normal light reflex; Nose: mucosa pink and moist, septum midline; Throat/Mouth: + PND. Teeth present, mucosa erythematous and moist, no exudate noted, no lesions or ulcerations noted.  Neck: Mild cervical lymphadenopathy. Neck supple, trachea midline. No massses, lumps or thyromegaly present.  Cardiovascular: Tachycardic. S1,S2 noted.  No murmur, rubs or gallops noted. No JVD or BLE edema. No carotid bruits noted. Pulmonary/Chest: Normal effort and bilateral inspiratory and expiratory wheezing and scattered  rhonchi. No respiratory distress. No ronchi. Abd: S/NT  Lab Results  Component Value Date   WBC 13.0* 04/27/2013   HGB 13.6 04/27/2013   HCT 41.0 04/27/2013   PLT 444.0* 04/27/2013   GLUCOSE 251* 12/21/2013   CHOL 239* 04/27/2013   TRIG 208.0* 04/27/2013   HDL 40.60 04/27/2013   LDLDIRECT 187.4 04/27/2013   LDLCALC  Value: 136        Total Cholesterol/HDL:CHD Risk Coronary Heart Disease Risk Table                     Men   Women  1/2 Average Risk   3.4   3.3  Average Risk       5.0   4.4  2 X Average Risk   9.6   7.1  3 X Average Risk  23.4   11.0        Use the calculated Patient Ratio above and the CHD Risk Table to determine the patient's CHD Risk.        ATP III CLASSIFICATION (LDL):  <100      mg/dL   Optimal  100-129  mg/dL   Near or Above                    Optimal  130-159  mg/dL   Borderline  160-189  mg/dL   High  >190     mg/dL   Very High* 02/20/2011   ALT 22 04/27/2013   AST 22 04/27/2013   NA 140 12/21/2013   K 4.2 12/21/2013   CL 104 12/21/2013   CREATININE 0.6 12/21/2013   BUN 13 12/21/2013   CO2 27 12/21/2013   TSH 0.93 04/27/2013   PSA 0.02* 01/01/2008   HGBA1C 11.0* 12/21/2013     Assessment & Plan:

## 2014-04-15 NOTE — Progress Notes (Signed)
Pre visit review using our clinic review tool, if applicable. No additional management support is needed unless otherwise documented below in the visit note. 

## 2014-04-15 NOTE — Assessment & Plan Note (Signed)
Continue with current prescription therapy as reflected on the Med list.  Albuterol can give cramps

## 2014-04-15 NOTE — Assessment & Plan Note (Signed)
Tylenol pm for cramps Albuterol can give cramps

## 2014-04-19 ENCOUNTER — Encounter: Payer: Self-pay | Admitting: Internal Medicine

## 2014-05-02 ENCOUNTER — Encounter: Payer: Self-pay | Admitting: Internal Medicine

## 2014-05-05 ENCOUNTER — Other Ambulatory Visit: Payer: Self-pay | Admitting: Internal Medicine

## 2014-05-11 ENCOUNTER — Encounter: Payer: Self-pay | Admitting: *Deleted

## 2014-05-11 ENCOUNTER — Telehealth: Payer: Self-pay | Admitting: *Deleted

## 2014-05-11 DIAGNOSIS — E119 Type 2 diabetes mellitus without complications: Secondary | ICD-10-CM

## 2014-05-11 NOTE — Telephone Encounter (Signed)
Sent message to [employer].  Lipid panel ordered.

## 2014-05-25 ENCOUNTER — Ambulatory Visit (INDEPENDENT_AMBULATORY_CARE_PROVIDER_SITE_OTHER): Payer: Commercial Managed Care - HMO | Admitting: Internal Medicine

## 2014-05-25 ENCOUNTER — Encounter: Payer: Self-pay | Admitting: Internal Medicine

## 2014-05-25 VITALS — BP 118/88 | HR 112 | Temp 98.6°F | Wt 159.0 lb

## 2014-05-25 DIAGNOSIS — J309 Allergic rhinitis, unspecified: Secondary | ICD-10-CM

## 2014-05-25 DIAGNOSIS — R059 Cough, unspecified: Secondary | ICD-10-CM

## 2014-05-25 DIAGNOSIS — R05 Cough: Secondary | ICD-10-CM

## 2014-05-25 DIAGNOSIS — E119 Type 2 diabetes mellitus without complications: Secondary | ICD-10-CM

## 2014-05-25 DIAGNOSIS — J441 Chronic obstructive pulmonary disease with (acute) exacerbation: Secondary | ICD-10-CM

## 2014-05-25 MED ORDER — METHYLPREDNISOLONE ACETATE 80 MG/ML IJ SUSP
80.0000 mg | Freq: Once | INTRAMUSCULAR | Status: AC
  Administered 2014-05-25: 80 mg via INTRAMUSCULAR

## 2014-05-25 MED ORDER — PREDNISONE 10 MG PO TABS: ORAL_TABLET | ORAL | Status: DC

## 2014-05-25 NOTE — Assessment & Plan Note (Signed)
See Rx 

## 2014-05-25 NOTE — Assessment & Plan Note (Signed)
Continue with current prescription therapy as reflected on the Med list.  

## 2014-05-25 NOTE — Progress Notes (Signed)
Pre visit review using our clinic review tool, if applicable. No additional management support is needed unless otherwise documented below in the visit note. 

## 2014-05-25 NOTE — Progress Notes (Signed)
C/o SOB, wheezing x3d  She has a cat (all my life). She is dog sitting now...  F/u DM-2: CBGs are high >200 at home - high A1c--- 11.0  Pt presents to the clinic today for a f/u shortness of breath and wheezing for the past 4 wks- better  She does not smoke now.   Review of Systems      Past Medical History  Diagnosis Date  . Calcium deposits in tendon and bursa   . Esophageal reflux   . Personal history of malignant neoplasm of other parts of uterus   . Lateral epicondylitis  of elbow   . Personal history of other diseases of digestive system   . Barrett's esophagus   . Chronic airway obstruction, not elsewhere classified   . Depression   . Shortness of breath     upon exertion  . Diabetes mellitus 2008ish    type two  . Chronic kidney disease     kidney stone  . Idiopathic thrombocythemia     Family History  Problem Relation Age of Onset  . Liver cancer Sister   . Cancer Sister     pancreas  . Stroke Other   . Heart disease Mother     CHF  . Heart disease Father     MI    History   Social History  . Marital Status: Single    Spouse Name: N/A    Number of Children: N/A  . 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 Smoking status: Former Smoker    Quit date: 11/11/2004  . Smokeless tobacco: Never Used  . Alcohol Use: No  . Drug Use: No  . Sexual Activity: Not Currently   Other Topics Concern  . Not on file   Social History Narrative  . No narrative on file    Allergies  Allergen Reactions  . Amoxicillin-Pot Clavulanate Nausea Only and Other (See Comments)    REACTION: diarrhea "sick to stomach"     Constitutional: Positive fatigue. Denies headache, fever or abrupt weight changes.  HEENT:   Denies eye redness, eye pain, pressure behind the eyes, facial pain, nasal congestion, ear pain, ringing in the ears, wax buildup, runny nose or bloody  nose. Respiratory: cough and shortness of breath.   Cardiovascular: Denies chest pain, chest tightness, palpitations or swelling in the hands or feet.   No other specific complaints in a complete review of systems (except as listed in HPI above).  Objective:   BP 118/88  Pulse 112  Temp(Src) 98.6 F (37 C) (Oral)  Wt 159 lb (72.122 kg)  SpO2 90% Wt Readings from Last 3 Encounters:  05/25/14 159 lb (72.122 kg)  04/15/14 161 lb (73.029 kg)  01/10/14 161 lb (73.029 kg)     General: Appears her stated age, well developed, well nourished. HEENT: Head: normal shape and size; Eyes: sclera white, no icterus, conjunctiva pink, PERRLA and EOMs intact; Ears: Tm's gray and intact, normal light reflex; Nose: mucosa pink and moist, septum midline; Throat/Mouth: + PND. Teeth present, mucosa erythematous and moist, no exudate noted, no lesions or ulcerations noted.  Neck: Mild cervical lymphadenopathy. Neck supple, trachea midline. No massses, lumps or thyromegaly present.  Cardiovascular: Tachycardic. S1,S2 noted.  No murmur, rubs or gallops noted. No JVD or BLE edema. No carotid bruits noted. Pulmonary/Chest: Normal effort and bilateral inspiratory and expiratory wheezing and scattered rhonchi. Mild  respiratory distress. B ronchi. Abd: S/NT  Lab Results  Component Value Date   WBC 13.0* 04/27/2013   HGB 13.6 04/27/2013   HCT 41.0 04/27/2013   PLT 444.0* 04/27/2013   GLUCOSE 251* 12/21/2013   CHOL 239* 04/27/2013   TRIG 208.0* 04/27/2013   HDL 40.60 04/27/2013   LDLDIRECT 187.4 04/27/2013   LDLCALC  Value: 136        Total Cholesterol/HDL:CHD Risk Coronary Heart Disease Risk Table                     Men   Women  1/2 Average Risk   3.4   3.3  Average Risk       5.0   4.4  2 X Average Risk   9.6   7.1  3 X Average Risk  23.4   11.0        Use the calculated Patient Ratio above and the CHD Risk Table to determine the patient's CHD Risk.        ATP III CLASSIFICATION (LDL):  <100     mg/dL   Optimal   100-129  mg/dL   Near or Above                    Optimal  130-159  mg/dL   Borderline  160-189  mg/dL   High  >190     mg/dL   Very High* 02/20/2011   ALT 22 04/27/2013   AST 22 04/27/2013   NA 140 12/21/2013   K 4.2 12/21/2013   CL 104 12/21/2013   CREATININE 0.6 12/21/2013   BUN 13 12/21/2013   CO2 27 12/21/2013   TSH 0.93 04/27/2013   PSA 0.02* 01/01/2008   HGBA1C 11.0* 12/21/2013     Assessment & Plan:

## 2014-05-25 NOTE — Assessment & Plan Note (Signed)
DEPOMEDROL IM 80 MG IM Prednisone 10 mg: take 4 tabs a day x 3 days; then 3 tabs a day x 4 days; then 2 tabs a day x 4 days, then 1 tab a day x 6 days, then stop. Take pc.

## 2014-07-12 ENCOUNTER — Other Ambulatory Visit (INDEPENDENT_AMBULATORY_CARE_PROVIDER_SITE_OTHER): Payer: Commercial Managed Care - HMO

## 2014-07-12 ENCOUNTER — Other Ambulatory Visit: Payer: Self-pay | Admitting: Internal Medicine

## 2014-07-12 ENCOUNTER — Ambulatory Visit (INDEPENDENT_AMBULATORY_CARE_PROVIDER_SITE_OTHER): Payer: Commercial Managed Care - HMO | Admitting: Internal Medicine

## 2014-07-12 ENCOUNTER — Encounter: Payer: Self-pay | Admitting: Internal Medicine

## 2014-07-12 VITALS — BP 132/70 | HR 80 | Temp 98.4°F | Resp 16 | Wt 151.0 lb

## 2014-07-12 DIAGNOSIS — R109 Unspecified abdominal pain: Secondary | ICD-10-CM

## 2014-07-12 DIAGNOSIS — R1013 Epigastric pain: Secondary | ICD-10-CM

## 2014-07-12 DIAGNOSIS — IMO0001 Reserved for inherently not codable concepts without codable children: Secondary | ICD-10-CM

## 2014-07-12 DIAGNOSIS — R509 Fever, unspecified: Secondary | ICD-10-CM

## 2014-07-12 DIAGNOSIS — IMO0002 Reserved for concepts with insufficient information to code with codable children: Secondary | ICD-10-CM

## 2014-07-12 DIAGNOSIS — E1165 Type 2 diabetes mellitus with hyperglycemia: Secondary | ICD-10-CM

## 2014-07-12 DIAGNOSIS — E119 Type 2 diabetes mellitus without complications: Secondary | ICD-10-CM

## 2014-07-12 HISTORY — DX: Fever, unspecified: R50.9

## 2014-07-12 HISTORY — DX: Unspecified abdominal pain: R10.9

## 2014-07-12 HISTORY — DX: Epigastric pain: R10.13

## 2014-07-12 LAB — LIPID PANEL
Cholesterol: 217 mg/dL — ABNORMAL HIGH (ref 0–200)
HDL: 15.9 mg/dL — ABNORMAL LOW (ref >39.00)
NonHDL: 201.1
TRIGLYCERIDES: 293 mg/dL — AB (ref 0.0–149.0)
Total CHOL/HDL Ratio: 14
VLDL: 58.6 mg/dL — AB (ref 0.0–40.0)

## 2014-07-12 LAB — CBC WITH DIFFERENTIAL/PLATELET
BASOS PCT: 0.3 % (ref 0.0–3.0)
Basophils Absolute: 0.1 10*3/uL (ref 0.0–0.1)
EOS PCT: 0.7 % (ref 0.0–5.0)
Eosinophils Absolute: 0.1 10*3/uL (ref 0.0–0.7)
HCT: 38.3 % (ref 36.0–46.0)
HEMOGLOBIN: 12.4 g/dL (ref 12.0–15.0)
LYMPHS PCT: 17.1 % (ref 12.0–46.0)
Lymphs Abs: 3 10*3/uL (ref 0.7–4.0)
MCHC: 32.3 g/dL (ref 30.0–36.0)
MCV: 91.6 fl (ref 78.0–100.0)
MONOS PCT: 6.7 % (ref 3.0–12.0)
Monocytes Absolute: 1.2 10*3/uL — ABNORMAL HIGH (ref 0.1–1.0)
NEUTROS ABS: 13.1 10*3/uL — AB (ref 1.4–7.7)
NEUTROS PCT: 75.2 % (ref 43.0–77.0)
Platelets: 377 10*3/uL (ref 150.0–400.0)
RBC: 4.18 Mil/uL (ref 3.87–5.11)
RDW: 13.8 % (ref 11.5–15.5)
WBC: 17.4 10*3/uL — AB (ref 4.0–10.5)

## 2014-07-12 LAB — BASIC METABOLIC PANEL
BUN: 13 mg/dL (ref 6–23)
CALCIUM: 9.6 mg/dL (ref 8.4–10.5)
CO2: 29 meq/L (ref 19–32)
Chloride: 95 mEq/L — ABNORMAL LOW (ref 96–112)
Creatinine, Ser: 0.9 mg/dL (ref 0.4–1.2)
GFR: 69.71 mL/min (ref >60.00)
Glucose, Bld: 200 mg/dL — ABNORMAL HIGH (ref 70–99)
Potassium: 3.6 mEq/L (ref 3.5–5.1)
SODIUM: 134 meq/L — AB (ref 135–145)

## 2014-07-12 LAB — HEPATIC FUNCTION PANEL
ALBUMIN: 2.8 g/dL — AB (ref 3.5–5.2)
ALT: 38 U/L — ABNORMAL HIGH (ref 0–35)
AST: 45 U/L — AB (ref 0–37)
Alkaline Phosphatase: 88 U/L (ref 39–117)
BILIRUBIN DIRECT: 0.1 mg/dL (ref 0.0–0.3)
TOTAL PROTEIN: 7.7 g/dL (ref 6.0–8.3)
Total Bilirubin: 0.9 mg/dL (ref 0.2–1.2)

## 2014-07-12 LAB — URINALYSIS, ROUTINE W REFLEX MICROSCOPIC
NITRITE: NEGATIVE
Specific Gravity, Urine: 1.02 (ref 1.000–1.030)
TOTAL PROTEIN, URINE-UPE24: 100 — AB
Urine Glucose: NEGATIVE
Urobilinogen, UA: 0.2 (ref 0.0–1.0)
pH: 6 (ref 5.0–8.0)

## 2014-07-12 LAB — LIPASE: LIPASE: 19 U/L (ref 11.0–59.0)

## 2014-07-12 LAB — SEDIMENTATION RATE: Sed Rate: 103 mm/hr — ABNORMAL HIGH (ref 0–22)

## 2014-07-12 LAB — HEMOGLOBIN A1C: Hgb A1c MFr Bld: 9.3 % — ABNORMAL HIGH (ref 4.6–6.5)

## 2014-07-12 LAB — LDL CHOLESTEROL, DIRECT: Direct LDL: 160.1 mg/dL

## 2014-07-12 MED ORDER — TRAMADOL HCL 50 MG PO TABS: 50.0000 mg | ORAL_TABLET | Freq: Two times a day (BID) | ORAL | Status: DC | PRN

## 2014-07-12 MED ORDER — LEVOFLOXACIN 500 MG PO TABS: 500.0000 mg | ORAL_TABLET | Freq: Every day | ORAL | Status: DC

## 2014-07-12 NOTE — Assessment & Plan Note (Signed)
Continue with current prescription therapy as reflected on the Med list.  

## 2014-07-12 NOTE — Assessment & Plan Note (Signed)
8/15 lower R>L ? etiol - r/o pyelo and others T 40F

## 2014-07-12 NOTE — Progress Notes (Signed)
Pre visit review using our clinic review tool, if applicable. No additional management support is needed unless otherwise documented below in the visit note. 

## 2014-07-12 NOTE — Progress Notes (Signed)
C/o fever w/chills x 3 d. C/o abd pain a week ago after eating a tomato sandwich (ate a lot of them over past few weeks). No vomiting.   F/u DM-2: CBGs are high >200 at home - high A1c--- 11.0  She does not smoke now.   Review of Systems      Past Medical History  Diagnosis Date  . Calcium deposits in tendon and bursa   . Esophageal reflux   . Personal history of malignant neoplasm of other parts of uterus   . Lateral epicondylitis  of elbow   . Personal history of other diseases of digestive system   . Barrett's esophagus   . Chronic airway obstruction, not elsewhere classified   . Depression   . Shortness of breath     upon exertion  . Diabetes mellitus 2008ish    type two  . Chronic kidney disease     kidney stone  . Idiopathic thrombocythemia     Family History  Problem Relation Age of Onset  . Liver cancer Sister   . Cancer Sister     pancreas  . Stroke Other   . Heart disease Mother     CHF  . Heart disease Father     MI    History   Social History  . Marital Status: Single    Spouse Name: N/A    Number of Children: N/A  . 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 Smoking status: Former Smoker    Quit date: 11/11/2004  . Smokeless tobacco: Never Used  . Alcohol Use: No  . Drug Use: No  . Sexual Activity: Not Currently   Other Topics Concern  . Not on file   Social History Narrative  . No narrative on file    Allergies  Allergen Reactions  . Amoxicillin-Pot Clavulanate Nausea Only and Other (See Comments)    REACTION: diarrhea "sick to stomach"     Constitutional: Positive fatigue. Denies headache, fever or abrupt weight changes.  HEENT:   Denies eye redness, eye pain, pressure behind the eyes, facial pain, nasal congestion, ear pain, ringing in the ears, wax buildup, runny nose or bloody nose. Respiratory: cough and shortness of breath.    Cardiovascular: Denies chest pain, chest tightness, palpitations or swelling in the hands or feet.   No other specific complaints in a complete review of systems (except as listed in HPI above).  Objective:   BP 132/70  Pulse 80  Temp(Src) 98.4 F (36.9 C) (Oral)  Resp 16  Wt 151 lb (68.493 kg) Wt Readings from Last 3 Encounters:  07/12/14 151 lb (68.493 kg)  05/25/14 159 lb (72.122 kg)  04/15/14 161 lb (73.029 kg)   BP Readings from Last 3 Encounters:  07/12/14 132/70  05/25/14 118/88  04/15/14 140/84     General: Appears her stated age, well developed, well nourished. HEENT: Head: normal shape and size; Eyes: sclera white, no icterus, conjunctiva pink, PERRLA and EOMs intact; Ears: Tm's gray and intact, normal light reflex; Nose: mucosa pink and moist, septum midline; Throat/Mouth: + PND. Teeth present, mucosa erythematous and moist, no exudate noted, no lesions or ulcerations noted.  Neck: Mild cervical lymphadenopathy. Neck supple, trachea midline. No massses, lumps or thyromegaly present.  Cardiovascular: Tachycardic. S1,S2 noted.  No murmur, rubs or gallops noted. No JVD or BLE edema. No carotid bruits noted. Pulmonary/Chest: Normal effort and  bilateral inspiratory and expiratory wheezing and scattered rhonchi. Mild respiratory distress. No B ronchi. Abd: S/NT  Lab Results  Component Value Date   WBC 13.0* 04/27/2013   HGB 13.6 04/27/2013   HCT 41.0 04/27/2013   PLT 444.0* 04/27/2013   GLUCOSE 251* 12/21/2013   CHOL 239* 04/27/2013   TRIG 208.0* 04/27/2013   HDL 40.60 04/27/2013   LDLDIRECT 187.4 04/27/2013   LDLCALC  Value: 136        Total Cholesterol/HDL:CHD Risk Coronary Heart Disease Risk Table                     Men   Women  1/2 Average Risk   3.4   3.3  Average Risk       5.0   4.4  2 X Average Risk   9.6   7.1  3 X Average Risk  23.4   11.0        Use the calculated Patient Ratio above and the CHD Risk Table to determine the patient's CHD Risk.        ATP III  CLASSIFICATION (LDL):  <100     mg/dL   Optimal  100-129  mg/dL   Near or Above                    Optimal  130-159  mg/dL   Borderline  160-189  mg/dL   High  >190     mg/dL   Very High* 02/20/2011   ALT 22 04/27/2013   AST 22 04/27/2013   NA 140 12/21/2013   K 4.2 12/21/2013   CL 104 12/21/2013   CREATININE 0.6 12/21/2013   BUN 13 12/21/2013   CO2 27 12/21/2013   TSH 0.93 04/27/2013   PSA 0.02* 01/01/2008   HGBA1C 11.0* 12/21/2013     Assessment & Plan:

## 2014-07-12 NOTE — Assessment & Plan Note (Signed)
8/15 lower R>L ? etiol - r/o pyelo and others Labs Abd [place]

## 2014-07-12 NOTE — Patient Instructions (Signed)
Go to ER if bad 

## 2014-07-13 ENCOUNTER — Ambulatory Visit: Payer: Commercial Managed Care - HMO | Admitting: Internal Medicine

## 2014-07-27 ENCOUNTER — Ambulatory Visit (INDEPENDENT_AMBULATORY_CARE_PROVIDER_SITE_OTHER): Payer: Commercial Managed Care - HMO | Admitting: Internal Medicine

## 2014-07-27 ENCOUNTER — Encounter: Payer: Self-pay | Admitting: Internal Medicine

## 2014-07-27 VITALS — BP 140/80 | HR 84 | Temp 98.3°F | Resp 16 | Wt 155.0 lb

## 2014-07-27 DIAGNOSIS — Z9089 Acquired absence of other organs: Secondary | ICD-10-CM

## 2014-07-27 DIAGNOSIS — N10 Acute tubulo-interstitial nephritis: Secondary | ICD-10-CM

## 2014-07-27 DIAGNOSIS — Z9081 Acquired absence of spleen: Secondary | ICD-10-CM

## 2014-07-27 DIAGNOSIS — E1165 Type 2 diabetes mellitus with hyperglycemia: Secondary | ICD-10-CM

## 2014-07-27 DIAGNOSIS — IMO0001 Reserved for inherently not codable concepts without codable children: Secondary | ICD-10-CM

## 2014-07-27 DIAGNOSIS — IMO0002 Reserved for concepts with insufficient information to code with codable children: Secondary | ICD-10-CM

## 2014-07-27 DIAGNOSIS — Z23 Encounter for immunization: Secondary | ICD-10-CM

## 2014-07-27 DIAGNOSIS — N200 Calculus of kidney: Secondary | ICD-10-CM | POA: Insufficient documentation

## 2014-07-27 DIAGNOSIS — K2270 Barrett's esophagus without dysplasia: Secondary | ICD-10-CM

## 2014-07-27 DIAGNOSIS — Z2911 Encounter for prophylactic immunotherapy for respiratory syncytial virus (RSV): Secondary | ICD-10-CM

## 2014-07-27 HISTORY — DX: Calculus of kidney: N20.0

## 2014-07-27 MED ORDER — LEVOFLOXACIN 500 MG PO TABS: 500.0000 mg | ORAL_TABLET | Freq: Every day | ORAL | Status: DC

## 2014-07-27 MED ORDER — ALBUTEROL SULFATE HFA 108 (90 BASE) MCG/ACT IN AERS: 2.0000 | INHALATION_SPRAY | Freq: Four times a day (QID) | RESPIRATORY_TRACT | Status: DC | PRN

## 2014-07-27 NOTE — Assessment & Plan Note (Signed)
Treated

## 2014-07-27 NOTE — Progress Notes (Signed)
F/u DM-2: CBGs are high >200 at home - high A1c--- high  Pt presents to the clinic today for a f/u shortness of breath and wheezing for the past 4 wks- better  She does have COPD but states that her breathing has gotten better She does not smoke now. She has a cat (all her life). Sleeps in a recliner   Review of Systems      Past Medical History  Diagnosis Date  . Calcium deposits in tendon and bursa   . Esophageal reflux   . Personal history of malignant neoplasm of other parts of uterus   . Lateral epicondylitis  of elbow   . Personal history of other diseases of digestive system   . Barrett's esophagus   . Chronic airway obstruction, not elsewhere classified   . Depression   . Shortness of breath     upon exertion  . Diabetes mellitus 2008ish    type two  . Chronic kidney disease     kidney stone  . Idiopathic thrombocythemia     Family History  Problem Relation Age of Onset  . Liver cancer Sister   . Cancer Sister     pancreas  . Stroke Other   . Heart disease Mother     CHF  . Heart disease Father     MI    History   Social History  . Marital Status: Single    Spouse Name: N/A    Number of Children: N/A  . 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 Smoking status: Former Smoker    Quit date: 11/11/2004  . Smokeless tobacco: Never Used  . Alcohol Use: No  . Drug Use: No  . Sexual Activity: Not Currently   Other Topics Concern  . Not on file   Social History Narrative  . No narrative on file    Allergies  Allergen Reactions  . Amoxicillin-Pot Clavulanate Nausea Only and Other (See Comments)    REACTION: diarrhea "sick to stomach"     Constitutional: Positive fatigue. Denies headache, fever or abrupt weight changes.  HEENT:   Denies eye redness, eye pain, pressure behind the eyes, facial pain, nasal congestion, ear pain, ringing in the ears, wax buildup,  runny nose or bloody nose. Respiratory: No cough and shortness of breath.   Cardiovascular: Denies chest pain, chest tightness, palpitations or swelling in the hands or feet.   No other specific complaints in a complete review of systems (except as listed in HPI above).  Objective:   BP 140/80  Pulse 84  Temp(Src) 98.3 F (36.8 C) (Oral)  Resp 16  Wt 155 lb (70.308 kg) Wt Readings from Last 3 Encounters:  07/27/14 155 lb (70.308 kg)  07/12/14 151 lb (68.493 kg)  05/25/14 159 lb (72.122 kg)     General: Appears her stated age, well developed, well nourished. HEENT: Head: normal shape and size; Eyes: sclera white, no icterus, conjunctiva pink, PERRLA and EOMs intact; Ears: Tm's gray and intact, normal light reflex; Nose: mucosa pink and moist, septum midline; Throat/Mouth: + PND. Teeth present, mucosa erythematous and moist, no exudate noted, no lesions or ulcerations noted.  Neck: Mild cervical lymphadenopathy. Neck supple, trachea midline. No massses, lumps or thyromegaly present.  Cardiovascular: Tachycardic. S1,S2 noted.  No murmur, rubs or gallops noted. No JVD or BLE edema. No carotid bruits noted. Pulmonary/Chest: Normal effort and bilateral inspiratory and  expiratory wheezing and scattered rhonchi. No respiratory distress. No ronchi. Abd: S/NT  Lab Results  Component Value Date   WBC 17.4* 07/12/2014   HGB 12.4 07/12/2014   HCT 38.3 07/12/2014   PLT 377.0 07/12/2014   GLUCOSE 200* 07/12/2014   CHOL 217* 07/12/2014   TRIG 293.0* 07/12/2014   HDL 15.90* 07/12/2014   LDLDIRECT 160.1 07/12/2014   LDLCALC  Value: 136        Total Cholesterol/HDL:CHD Risk Coronary Heart Disease Risk Table                     Men   Women  1/2 Average Risk   3.4   3.3  Average Risk       5.0   4.4  2 X Average Risk   9.6   7.1  3 X Average Risk  23.4   11.0        Use the calculated Patient Ratio above and the CHD Risk Table to determine the patient's CHD Risk.        ATP III CLASSIFICATION (LDL):  <100     mg/dL    Optimal  100-129  mg/dL   Near or Above                    Optimal  130-159  mg/dL   Borderline  160-189  mg/dL   High  >190     mg/dL   Very High* 02/20/2011   ALT 38* 07/12/2014   AST 45* 07/12/2014   NA 134* 07/12/2014   K 3.6 07/12/2014   CL 95* 07/12/2014   CREATININE 0.9 07/12/2014   BUN 13 07/12/2014   CO2 29 07/12/2014   TSH 0.93 04/27/2013   PSA 0.02* 01/01/2008   HGBA1C 9.3* 07/12/2014     Assessment & Plan:

## 2014-07-27 NOTE — Assessment & Plan Note (Signed)
Levaquin Rx 

## 2014-07-27 NOTE — Assessment & Plan Note (Signed)
Better off Gatorade

## 2014-07-27 NOTE — Progress Notes (Deleted)
Pre visit review using our clinic review tool, if applicable. No additional management support is needed unless otherwise documented below in the visit note. 

## 2014-07-27 NOTE — Assessment & Plan Note (Signed)
F/u w/Dr Janice Norrie

## 2014-07-27 NOTE — Assessment & Plan Note (Signed)
Continue with current prescription therapy as reflected on the Med list.  

## 2014-08-26 ENCOUNTER — Other Ambulatory Visit: Payer: Self-pay

## 2014-09-09 ENCOUNTER — Encounter: Payer: Self-pay | Admitting: Internal Medicine

## 2014-09-09 ENCOUNTER — Ambulatory Visit (INDEPENDENT_AMBULATORY_CARE_PROVIDER_SITE_OTHER): Payer: Medicare HMO | Admitting: Internal Medicine

## 2014-09-09 ENCOUNTER — Inpatient Hospital Stay: Payer: Commercial Managed Care - HMO | Admitting: Internal Medicine

## 2014-09-09 VITALS — BP 138/70 | HR 97 | Temp 98.0°F | Ht 62.0 in | Wt 163.0 lb

## 2014-09-09 DIAGNOSIS — J441 Chronic obstructive pulmonary disease with (acute) exacerbation: Secondary | ICD-10-CM

## 2014-09-09 DIAGNOSIS — E1165 Type 2 diabetes mellitus with hyperglycemia: Secondary | ICD-10-CM

## 2014-09-09 DIAGNOSIS — R609 Edema, unspecified: Secondary | ICD-10-CM

## 2014-09-09 DIAGNOSIS — IMO0002 Reserved for concepts with insufficient information to code with codable children: Secondary | ICD-10-CM

## 2014-09-09 DIAGNOSIS — N10 Acute tubulo-interstitial nephritis: Secondary | ICD-10-CM

## 2014-09-09 HISTORY — DX: Edema, unspecified: R60.9

## 2014-09-09 LAB — POCT CBG MONITORING: GLUCOSE: 191

## 2014-09-09 MED ORDER — PREDNISONE 10 MG PO TABS: ORAL_TABLET | ORAL | Status: DC

## 2014-09-09 MED ORDER — FUROSEMIDE 20 MG PO TABS: 20.0000 mg | ORAL_TABLET | Freq: Every day | ORAL | Status: DC | PRN

## 2014-09-09 MED ORDER — TUDORZA PRESSAIR 400 MCG/ACT IN AEPB: 1.0000 | INHALATION_SPRAY | Freq: Two times a day (BID) | RESPIRATORY_TRACT | Status: DC

## 2014-09-09 NOTE — Progress Notes (Signed)
Post-hosp f/u:  D/c'd from [facility] yesterday (COPD exacerbation, sepsis, hypoxia). C/o weakness. MI was ruled out. Hospital record was reviewed [facility])  F/u DM-2: CBGs are high >200 at home - high A1c--- high  Pt presents to the clinic today for a f/u shortness of breath and wheezing for the past 4 wks- better  She does have COPD but states that her breathing has gotten better She does not smoke now. She has a cat (all her life). Sleeps in a recliner   Review of Systems      Past Medical History  Diagnosis Date  . Calcium deposits in tendon and bursa   . Esophageal reflux   . Personal history of malignant neoplasm of other parts of uterus   . Lateral epicondylitis  of elbow   . Personal history of other diseases of digestive system   . Barrett's esophagus   . Chronic airway obstruction, not elsewhere classified   . Depression   . Shortness of breath     upon exertion  . Diabetes mellitus 2008ish    type two  . Chronic kidney disease     kidney stone  . Idiopathic thrombocythemia     Family History  Problem Relation Age of Onset  . Liver cancer Sister   . Cancer Sister     pancreas  . Stroke Other   . Heart disease Mother     CHF  . Heart disease Father     MI    History   Social History  . Marital Status: Single    Spouse Name: N/A    Number of Children: N/A  . 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 Smoking status: Former Smoker    Quit date: 11/11/2004  . Smokeless tobacco: Never Used  . Alcohol Use: No  . Drug Use: No  . Sexual Activity: Not Currently   Other Topics Concern  . Not on file   Social History Narrative  . No narrative on file    Allergies  Allergen Reactions  . Amoxicillin-Pot Clavulanate Nausea Only and Other (See Comments)    REACTION: diarrhea "sick to stomach"     Constitutional: Positive fatigue. Denies headache,  fever or abrupt weight changes.  HEENT:   Denies eye redness, eye pain, pressure behind the eyes, facial pain, nasal congestion, ear pain, ringing in the ears, wax buildup, runny nose or bloody nose. Respiratory: No cough and shortness of breath.   Cardiovascular: Denies chest pain, chest tightness, palpitations or swelling in the hands or feet.   No other specific complaints in a complete review of systems (except as listed in HPI above).  Objective:   BP 138/70  Pulse 97  Temp(Src) 98 F (36.7 C) (Oral)  Ht 5\' 2"  (1.575 m)  Wt 163 lb (73.936 kg)  BMI 29.81 kg/m2  SpO2 95% Wt Readings from Last 3 Encounters:  09/09/14 163 lb (73.936 kg)  07/27/14 155 lb (70.308 kg)  07/12/14 151 lb (68.493 kg)     General: Appears her stated age, well developed, well nourished. Looks tired HEENT: Head: normal shape and size; Eyes: sclera white, no icterus, conjunctiva pink, PERRLA and EOMs intact; Ears: Tm's gray and intact, normal light reflex; Nose: mucosa pink and moist, septum midline; Throat/Mouth: + PND. Teeth present, mucosa erythematous and moist, no exudate noted, no lesions or ulcerations noted.  Neck: Mild cervical lymphadenopathy. Neck  supple, trachea midline. No massses, lumps or thyromegaly present.  Cardiovascular: Tachycardic. S1,S2 noted.  No murmur, rubs or gallops noted. No JVD or BLE edema. No carotid bruits noted. Pulmonary/Chest: Normal effort and bilateral inspiratory and expiratory wheezing and scattered rhonchi. No respiratory distress. No ronchi. Abd: S/NT trace to 1+ edema on B ankles  Lab Results  Component Value Date   WBC 17.4* 07/12/2014   HGB 12.4 07/12/2014   HCT 38.3 07/12/2014   PLT 377.0 07/12/2014   GLUCOSE 200* 07/12/2014   CHOL 217* 07/12/2014   TRIG 293.0* 07/12/2014   HDL 15.90* 07/12/2014   LDLDIRECT 160.1 07/12/2014   LDLCALC  Value: 136        Total Cholesterol/HDL:CHD Risk Coronary Heart Disease Risk Table                     Men   Women  1/2 Average Risk   3.4    3.3  Average Risk       5.0   4.4  2 X Average Risk   9.6   7.1  3 X Average Risk  23.4   11.0        Use the calculated Patient Ratio above and the CHD Risk Table to determine the patient's CHD Risk.        ATP III CLASSIFICATION (LDL):  <100     mg/dL   Optimal  100-129  mg/dL   Near or Above                    Optimal  130-159  mg/dL   Borderline  160-189  mg/dL   High  >190     mg/dL   Very High* 02/20/2011   ALT 38* 07/12/2014   AST 45* 07/12/2014   NA 134* 07/12/2014   K 3.6 07/12/2014   CL 95* 07/12/2014   CREATININE 0.9 07/12/2014   BUN 13 07/12/2014   CO2 29 07/12/2014   TSH 0.93 04/27/2013   PSA 0.02* 01/01/2008   HGBA1C 9.3* 07/12/2014     Assessment & Plan:

## 2014-09-09 NOTE — Progress Notes (Signed)
Pre visit review using our clinic review tool, if applicable. No additional management support is needed unless otherwise documented below in the visit note. 

## 2014-09-09 NOTE — Assessment & Plan Note (Signed)
Continue with current prescription therapy as reflected on the Med list.  

## 2014-09-09 NOTE — Assessment & Plan Note (Signed)
On Levaquin

## 2014-09-09 NOTE — Assessment & Plan Note (Addendum)
Continue with current prescription therapy as reflected on the Med list. Hospital record was reviewed [facility])

## 2014-09-09 NOTE — Patient Instructions (Signed)
Please rest for a few more days

## 2014-09-09 NOTE — Assessment & Plan Note (Signed)
10/15 B LE See Rx NAS diet

## 2014-09-14 ENCOUNTER — Telehealth: Payer: Self-pay | Admitting: Internal Medicine

## 2014-09-14 NOTE — Telephone Encounter (Signed)
Patient wants you to know that she is vomiting and has diarhea and she would like an idea of what you think is going on,

## 2014-09-14 NOTE — Telephone Encounter (Signed)
[place] Imodium AD Bring stool for C diff OV if ill - any provider Thx

## 2014-09-16 NOTE — Telephone Encounter (Signed)
Left detailed mess informing pt of below.  

## 2014-10-08 DIAGNOSIS — R609 Edema, unspecified: Secondary | ICD-10-CM

## 2014-10-08 DIAGNOSIS — N10 Acute tubulo-interstitial nephritis: Secondary | ICD-10-CM | POA: Diagnosis not present

## 2014-10-08 DIAGNOSIS — E1165 Type 2 diabetes mellitus with hyperglycemia: Secondary | ICD-10-CM | POA: Diagnosis not present

## 2014-10-08 DIAGNOSIS — J441 Chronic obstructive pulmonary disease with (acute) exacerbation: Secondary | ICD-10-CM | POA: Diagnosis not present

## 2014-10-26 ENCOUNTER — Ambulatory Visit: Payer: Commercial Managed Care - HMO | Admitting: Internal Medicine

## 2014-11-24 ENCOUNTER — Ambulatory Visit: Payer: Commercial Managed Care - HMO | Admitting: Internal Medicine

## 2014-12-08 ENCOUNTER — Ambulatory Visit: Payer: Commercial Managed Care - HMO | Admitting: Internal Medicine

## 2015-01-06 ENCOUNTER — Ambulatory Visit: Payer: Commercial Managed Care - HMO | Admitting: Internal Medicine

## 2015-01-11 ENCOUNTER — Ambulatory Visit (INDEPENDENT_AMBULATORY_CARE_PROVIDER_SITE_OTHER): Payer: Commercial Managed Care - HMO | Admitting: Internal Medicine

## 2015-01-11 ENCOUNTER — Encounter: Payer: Self-pay | Admitting: Internal Medicine

## 2015-01-11 VITALS — BP 140/84 | HR 110 | Temp 98.3°F | Wt 157.0 lb

## 2015-01-11 DIAGNOSIS — IMO0002 Reserved for concepts with insufficient information to code with codable children: Secondary | ICD-10-CM

## 2015-01-11 DIAGNOSIS — E1165 Type 2 diabetes mellitus with hyperglycemia: Secondary | ICD-10-CM

## 2015-01-11 MED ORDER — GLUCOSE BLOOD VI STRP: 1.0000 | ORAL_STRIP | Freq: Every day | Status: DC | PRN

## 2015-01-11 MED ORDER — OMEPRAZOLE 40 MG PO CPDR: 40.0000 mg | DELAYED_RELEASE_CAPSULE | Freq: Every day | ORAL | Status: DC

## 2015-01-11 MED ORDER — MOMETASONE FURO-FORMOTEROL FUM 200-5 MCG/ACT IN AERO: 2.0000 | INHALATION_SPRAY | Freq: Two times a day (BID) | RESPIRATORY_TRACT | Status: DC

## 2015-01-11 MED ORDER — INSULIN DETEMIR 100 UNIT/ML FLEXPEN: PEN_INJECTOR | SUBCUTANEOUS | Status: DC

## 2015-01-11 NOTE — Assessment & Plan Note (Signed)
Continue with current prescription therapy as reflected on the Med list - Levemir. C/o issues w/coverage... Risks associated with treatment noncompliance were discussed. Compliance was encouraged.

## 2015-01-11 NOTE — Progress Notes (Signed)
Pre visit review using our clinic review tool, if applicable. No additional management support is needed unless otherwise documented below in the visit note. 

## 2015-01-11 NOTE — Progress Notes (Signed)
Pt presents to the clinic today for a f/u shortness of breath and wheezing for the past 4 wks- better  She does have COPD but states that her breathing has gotten better She does not smoke now. She has a cat (all her life). Sleeps in a recliner   Review of Systems      Past Medical History  Diagnosis Date  . Calcium deposits in tendon and bursa   . Esophageal reflux   . Personal history of malignant neoplasm of other parts of uterus   . Lateral epicondylitis  of elbow   . Personal history of other diseases of digestive system   . Barrett's esophagus   . Chronic airway obstruction, not elsewhere classified   . Depression   . Shortness of breath     upon exertion  . Diabetes mellitus 2008ish    type two  . Chronic kidney disease     kidney stone  . Idiopathic thrombocythemia     Family History  Problem Relation Age of Onset  . Liver cancer Sister   . Cancer Sister     pancreas  . Stroke Other   . Heart disease Mother     CHF  . Heart disease Father     MI    History   Social History  . Marital Status: Single    Spouse Name: N/A  . Number of Children: N/A  . 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 Smoking status: Former Smoker    Quit date: 11/11/2004  . Smokeless tobacco: Never Used  . Alcohol Use: No  . Drug Use: No  . Sexual Activity: Not Currently   Other Topics Concern  . Not on file   Social History Narrative    Allergies  Allergen Reactions  . Amoxicillin-Pot Clavulanate Nausea Only and Other (See Comments)    REACTION: diarrhea "sick to stomach"     Constitutional: Positive fatigue. Denies headache, fever or abrupt weight changes.  HEENT:   Denies eye redness, eye pain, pressure behind the eyes, facial pain, nasal congestion, ear pain, ringing in the ears, wax buildup, runny nose or bloody nose. Respiratory: No cough and shortness of breath.    Cardiovascular: Denies chest pain, chest tightness, palpitations or swelling in the hands or feet.   No other specific complaints in a complete review of systems (except as listed in HPI above).  Objective:   BP 140/84 mmHg  Pulse 110  Temp(Src) 98.3 F (36.8 C) (Oral)  Wt 157 lb (71.215 kg)  SpO2 93% Wt Readings from Last 3 Encounters:  01/11/15 157 lb (71.215 kg)  09/09/14 163 lb (73.936 kg)  07/27/14 155 lb (70.308 kg)     General: Appears her stated age, well developed, well nourished. Looks tired HEENT: Head: normal shape and size; Eyes: sclera white, no icterus, conjunctiva pink, PERRLA and EOMs intact; Ears: Tm's gray and intact, normal light reflex; Nose: mucosa pink and moist, septum midline; Throat/Mouth: + PND. Teeth present, mucosa erythematous and moist, no exudate noted, no lesions or ulcerations noted.  Neck: Mild cervical lymphadenopathy. Neck supple, trachea midline. No massses, lumps or thyromegaly present.  Cardiovascular: Tachycardic. S1,S2 noted.  No murmur, rubs or gallops noted. No JVD or BLE edema. No carotid bruits noted. Pulmonary/Chest: Normal effort and bilateral inspiratory and expiratory wheezing and scattered rhonchi. No respiratory distress. No ronchi. Abd: S/NT trace to 1+  edema on B ankles  Lab Results  Component Value Date   WBC 17.4* 07/12/2014   HGB 12.4 07/12/2014   HCT 38.3 07/12/2014   PLT 377.0 07/12/2014   GLUCOSE 191 09/09/2014   CHOL 217* 07/12/2014   TRIG 293.0* 07/12/2014   HDL 15.90* 07/12/2014   LDLDIRECT 160.1 07/12/2014   LDLCALC * 02/20/2011    136        Total Cholesterol/HDL:CHD Risk Coronary Heart Disease Risk Table                     Men   Women  1/2 Average Risk   3.4   3.3  Average Risk       5.0   4.4  2 X Average Risk   9.6   7.1  3 X Average Risk  23.4   11.0        Use the calculated Patient Ratio above and the CHD Risk Table to determine the patient's CHD Risk.        ATP III CLASSIFICATION (LDL):   <100     mg/dL   Optimal  100-129  mg/dL   Near or Above                    Optimal  130-159  mg/dL   Borderline  160-189  mg/dL   High  >190     mg/dL   Very High   ALT 38* 07/12/2014   AST 45* 07/12/2014   NA 134* 07/12/2014   K 3.6 07/12/2014   CL 95* 07/12/2014   CREATININE 0.9 07/12/2014   BUN 13 07/12/2014   CO2 29 07/12/2014   TSH 0.93 04/27/2013   PSA 0.02* 01/01/2008   HGBA1C 9.3* 07/12/2014     Assessment & Plan:

## 2015-03-21 ENCOUNTER — Ambulatory Visit: Payer: Commercial Managed Care - HMO | Admitting: Family

## 2015-03-21 ENCOUNTER — Other Ambulatory Visit (INDEPENDENT_AMBULATORY_CARE_PROVIDER_SITE_OTHER): Payer: Commercial Managed Care - HMO

## 2015-03-21 ENCOUNTER — Encounter: Payer: Self-pay | Admitting: Family Medicine

## 2015-03-21 ENCOUNTER — Ambulatory Visit (INDEPENDENT_AMBULATORY_CARE_PROVIDER_SITE_OTHER): Payer: Commercial Managed Care - HMO | Admitting: Family Medicine

## 2015-03-21 VITALS — BP 122/80 | HR 112 | Ht 62.0 in | Wt 158.0 lb

## 2015-03-21 DIAGNOSIS — M25512 Pain in left shoulder: Secondary | ICD-10-CM

## 2015-03-21 DIAGNOSIS — M12819 Other specific arthropathies, not elsewhere classified, unspecified shoulder: Secondary | ICD-10-CM | POA: Insufficient documentation

## 2015-03-21 DIAGNOSIS — M12512 Traumatic arthropathy, left shoulder: Secondary | ICD-10-CM | POA: Diagnosis not present

## 2015-03-21 DIAGNOSIS — M12812 Other specific arthropathies, not elsewhere classified, left shoulder: Secondary | ICD-10-CM

## 2015-03-21 HISTORY — DX: Other specific arthropathies, not elsewhere classified, unspecified shoulder: M12.819

## 2015-03-21 NOTE — Patient Instructions (Signed)
Good to see you.  Ice 20 minutes 2 times daily. Usually after activity and before bed. Exercises 3 times a week.  Vitamin D 2000 IU daily pennsaid pinkie amount topically 2 times daily as needed.  See me again in 3 weeks.

## 2015-03-21 NOTE — Progress Notes (Signed)
Pre visit review using our clinic review tool, if applicable. No additional management support is needed unless otherwise documented below in the visit note. 

## 2015-03-21 NOTE — Assessment & Plan Note (Signed)
She was given an injection today. We discussed icing regimen and home exercises. We discussed what potential activities to avoid. Patient work with Product/process development scientist in greater detail today. Patient is going to try this and continue her vitamin D supplementation and come back in 3 weeks for further evaluation and treatment. Avoid anti-inflammatories secondary to patient's gastroesophageal reflux as well as Barrett's.

## 2015-03-21 NOTE — Progress Notes (Signed)
[facility] [address] [place], [place] [address] Phone: [phone] Subjective:    I'm seeing this patient by the request  of:  Walker Kehr, MD   CC: Left shoulder pain  [id number] LANIJAH Pam Gamble is a 69 y.o. female coming in with complaint of left shoulder pain. Patient states that this started approximately 2-1/2 weeks ago. Patient describes it as a dull throbbing aching sensation. Patient states that it can be a sharp pain with certain movements. Does not remember any true injury. Rates the severity of pain is 8 out of 10. Stopping some of her activities of daily living. Denies any nighttime awakening. Never had pain in the shoulder like this previously. No association with exercise, breathing, or cardiac she states.  Past Medical History  Diagnosis Date  . Calcium deposits in tendon and bursa   . Esophageal reflux   . Personal history of malignant neoplasm of other parts of uterus   . Lateral epicondylitis  of elbow   . Personal history of other diseases of digestive system   . Barrett's esophagus   . Chronic airway obstruction, not elsewhere classified   . Depression   . Shortness of breath     upon exertion  . Diabetes mellitus 2008ish    type two  . Chronic kidney disease     kidney stone  . Idiopathic thrombocythemia    Past Surgical History  Procedure Laterality Date  . Tonsillectomy and adenoidectomy    . Abdominal hysterectomy    . Splenectomy  1999   History  Substance Use Topics  . Smoking status: Former Smoker    Quit date: 11/11/2004  . Smokeless tobacco: Never Used  . Alcohol Use: No   Allergies  Allergen Reactions  . Amoxicillin-Pot Clavulanate Nausea Only and Other (See Comments)    REACTION: diarrhea "sick to stomach"   Family History  Problem Relation Age of Onset  . Liver cancer Sister   . Cancer Sister     pancreas  . Stroke Other   . Heart disease Mother     CHF  . Heart disease Father     MI         Past medical history, social, surgical and family history all reviewed in electronic medical record.   Review of Systems: No headache, visual changes, nausea, vomiting, diarrhea, constipation, dizziness, abdominal pain, skin rash, fevers, chills, night sweats, weight loss, swollen lymph nodes, body aches, joint swelling, muscle aches, chest pain, shortness of breath, mood changes.   Objective Blood pressure 122/80, pulse 112, height 5\' 2"  (1.575 m), weight 158 lb (71.668 kg), SpO2 96 %.  General: No apparent distress alert and oriented x3 mood and affect normal, dressed appropriately.  HEENT: Pupils equal, extraocular movements intact  Respiratory: Patient's speak in full sentences and does not appear short of breath  Cardiovascular: No lower extremity edema, non tender, no erythema  Skin: Warm dry intact with no signs of infection or rash on extremities or on axial skeleton.  Abdomen: Soft nontender  Neuro: Cranial nerves II through XII are intact, neurovascularly intact in all extremities with 2+ DTRs and 2+ pulses.  Lymph: No lymphadenopathy of posterior or anterior cervical chain or axillae bilaterally.  Gait normal with good balance and coordination.  MSK:  Non tender with full range of motion and good stability and symmetric strength and tone of  elbows, wrist, hip, knee and ankles bilaterally.  Shoulder: left Inspection reveals no abnormalities, atrophy or  asymmetry. Palpation is normal with no tenderness over AC joint or bicipital groove. Mild limitation in external range of motion but same on contralateral arm Rotator cuff strength 4 out of 5 but symmetric signs of impingement with positive Neer and Hawkin's tests, but negative empty can sign. Speeds and Yergason's tests normal. Mild positive labral pathology Normal scapular function observed. No painful arc and no drop arm sign. No apprehension sign Contralateral shoulder has some mild decreased range of motion as  well.  MSK US performed of: left This study was ordered, performed, and interpreted by Charlann Boxer D.O.  Shoulder:   Supraspinatus:  Degenerative changes but no acute tear, Bursal bulge seen with shoulder abduction on impingement view. Infraspinatus:  Appears normal on long and transverse views. Significant increase in Doppler flow Subscapularis:  Degenerative tears Positive bursa Teres Minor:  Appears normal on long and transverse views. AC joint:  Moderate arthritis Glenohumeral Joint:  Degenerative changes Glenoid Labrum:  Degenerative changes Biceps Tendon:  Hypoechoic changes but no specific tear  Impression: Subacromial bursitis, degenerative changes of the rotator cuff with degenerative tearing and moderate osteophytic changes.  Procedure: Real-time Ultrasound Guided Injection of left glenohumeral joint Device: GE Logiq E  Ultrasound guided injection is preferred based studies that show increased duration, increased effect, greater accuracy, decreased procedural pain, increased response rate with ultrasound guided versus blind injection.  Verbal informed consent obtained.  Time-out conducted.  Noted no overlying erythema, induration, or other signs of local infection.  Skin prepped in a sterile fashion.  Local anesthesia: Topical Ethyl chloride.  With sterile technique and under real time ultrasound guidance:  Joint visualized.  23g 1  inch needle inserted posterior approach. Pictures taken for needle placement. Patient did have injection of 2 cc of 1% lidocaine, 2 cc of 0.5% Marcaine, and 1.0 cc of Kenalog 40 mg/dL. Completed without difficulty  Pain immediately resolved suggesting accurate placement of the medication.  Advised to call if fevers/chills, erythema, induration, drainage, or persistent bleeding.  Images permanently stored and available for review in the ultrasound unit.  Impression: Technically successful ultrasound guided injection.  Procedure note [id number]; 15  minutes spent for Therapeutic exercises as stated in above notes.  This included exercises focusing on stretching, strengthening, with significant focus on eccentric aspects.  Shoulder Exercises that included:  Basic scapular stabilization to include adduction and depression of scapula Scaption, focusing on proper movement and good control Internal and External rotation utilizing a theraband, with elbow tucked at side entire time Rows with theraband Proper technique shown and discussed handout in great detail with ATC.  All questions were discussed and answered.      Impression and Recommendations:     This case required medical decision making of moderate complexity.

## 2015-04-11 ENCOUNTER — Ambulatory Visit: Payer: Medicare HMO | Admitting: Internal Medicine

## 2015-04-11 ENCOUNTER — Ambulatory Visit: Payer: Medicare HMO | Admitting: Family Medicine

## 2015-04-12 ENCOUNTER — Ambulatory Visit: Payer: Medicare HMO | Admitting: Family Medicine

## 2015-04-27 ENCOUNTER — Telehealth: Payer: Self-pay | Admitting: Internal Medicine

## 2015-04-27 MED ORDER — FLUCONAZOLE 150 MG PO TABS: 150.0000 mg | ORAL_TABLET | Freq: Once | ORAL | Status: DC

## 2015-04-27 NOTE — Telephone Encounter (Signed)
Patient has a yeast infection and is wondering if you can call something in to [facility] in [place].

## 2015-04-27 NOTE — Telephone Encounter (Signed)
Ok done Thx 

## 2015-04-28 NOTE — Telephone Encounter (Signed)
Notified pt md sent diflucan to pharamcy...Pam Gamble

## 2015-05-03 ENCOUNTER — Ambulatory Visit (INDEPENDENT_AMBULATORY_CARE_PROVIDER_SITE_OTHER): Payer: Commercial Managed Care - HMO | Admitting: Family Medicine

## 2015-05-03 ENCOUNTER — Encounter: Payer: Self-pay | Admitting: Family Medicine

## 2015-05-03 ENCOUNTER — Ambulatory Visit (INDEPENDENT_AMBULATORY_CARE_PROVIDER_SITE_OTHER): Payer: Commercial Managed Care - HMO | Admitting: Internal Medicine

## 2015-05-03 ENCOUNTER — Encounter: Payer: Self-pay | Admitting: Internal Medicine

## 2015-05-03 VITALS — BP 140/82 | HR 91 | Wt 156.0 lb

## 2015-05-03 VITALS — BP 138/82 | HR 91 | Ht 62.0 in | Wt 156.0 lb

## 2015-05-03 DIAGNOSIS — N952 Postmenopausal atrophic vaginitis: Secondary | ICD-10-CM

## 2015-05-03 DIAGNOSIS — IMO0002 Reserved for concepts with insufficient information to code with codable children: Secondary | ICD-10-CM

## 2015-05-03 DIAGNOSIS — E1165 Type 2 diabetes mellitus with hyperglycemia: Secondary | ICD-10-CM

## 2015-05-03 DIAGNOSIS — M12512 Traumatic arthropathy, left shoulder: Secondary | ICD-10-CM

## 2015-05-03 DIAGNOSIS — Z9081 Acquired absence of spleen: Secondary | ICD-10-CM

## 2015-05-03 DIAGNOSIS — F330 Major depressive disorder, recurrent, mild: Secondary | ICD-10-CM

## 2015-05-03 DIAGNOSIS — M12812 Other specific arthropathies, not elsewhere classified, left shoulder: Secondary | ICD-10-CM

## 2015-05-03 HISTORY — DX: Postmenopausal atrophic vaginitis: N95.2

## 2015-05-03 MED ORDER — PREDNISONE 10 MG PO TABS: ORAL_TABLET | ORAL | Status: DC

## 2015-05-03 MED ORDER — LEVOFLOXACIN 500 MG PO TABS: 500.0000 mg | ORAL_TABLET | Freq: Every day | ORAL | Status: DC

## 2015-05-03 MED ORDER — FLUCONAZOLE 150 MG PO TABS: 150.0000 mg | ORAL_TABLET | Freq: Once | ORAL | Status: DC

## 2015-05-03 MED ORDER — ESTROGENS, CONJUGATED 0.625 MG/GM VA CREA: TOPICAL_CREAM | VAGINAL | Status: DC

## 2015-05-03 NOTE — Assessment & Plan Note (Signed)
Remote  Levaquin Rx Vaccines

## 2015-05-03 NOTE — Assessment & Plan Note (Signed)
Much better after last injection. Discussed with patient to continue to monitor symptoms. Discussed we can repeat injection every 3-4 months if necessary. Patient will continue with the icing protocol. Patient will continue the exercises 3 times a week for another 6 weeks and will follow-up as necessary.

## 2015-05-03 NOTE — Progress Notes (Signed)
C/o a repeat "yeast infection". CBGs are up.  Pt presents to the clinic today for a f/u shortness of breath and wheezing for the past 4 wks- better  She does have COPD but states that her breathing has gotten better She does not smoke now. She has a cat (all her life). Sleeps in a recliner   Review of Systems      Past Medical History  Diagnosis Date  . Calcium deposits in tendon and bursa   . Esophageal reflux   . Personal history of malignant neoplasm of other parts of uterus   . Lateral epicondylitis  of elbow   . Personal history of other diseases of digestive system   . Barrett's esophagus   . Chronic airway obstruction, not elsewhere classified   . Depression   . Shortness of breath     upon exertion  . Diabetes mellitus 2008ish    type two  . Chronic kidney disease     kidney stone  . Idiopathic thrombocythemia     Family History  Problem Relation Age of Onset  . Liver cancer Sister   . Cancer Sister     pancreas  . Stroke Other   . Heart disease Mother     CHF  . Heart disease Father     MI    History   Social History  . Marital Status: Single    Spouse Name: N/A  . Number of Children: N/A  . 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 Smoking status: Former Smoker    Quit date: 11/11/2004  . Smokeless tobacco: Never Used  . Alcohol Use: No  . Drug Use: No  . Sexual Activity: Not Currently   Other Topics Concern  . Not on file   Social History Narrative    Allergies  Allergen Reactions  . Amoxicillin-Pot Clavulanate Nausea Only and Other (See Comments)    REACTION: diarrhea "sick to stomach"     Constitutional: Positive fatigue. Denies headache, fever or abrupt weight changes.  HEENT:   Denies eye redness, eye pain, pressure behind the eyes, facial pain, nasal congestion, ear pain, ringing in the ears, wax buildup, runny nose or bloody  nose. Respiratory: No cough and shortness of breath.   Cardiovascular: Denies chest pain, chest tightness, palpitations or swelling in the hands or feet.   No other specific complaints in a complete review of systems (except as listed in HPI above).  Objective:   BP 140/82 mmHg  Pulse 91  Wt 156 lb (70.761 kg)  SpO2 93% Wt Readings from Last 3 Encounters:  05/03/15 156 lb (70.761 kg)  03/21/15 158 lb (71.668 kg)  01/11/15 157 lb (71.215 kg)     General: Appears her stated age, well developed, well nourished. Looks tired HEENT: Head: normal shape and size; Eyes: sclera white, no icterus, conjunctiva pink, PERRLA and EOMs intact; Ears: Tm's gray and intact, normal light reflex; Nose: mucosa pink and moist, septum midline; Throat/Mouth: + PND. Teeth present, mucosa erythematous and moist, no exudate noted, no lesions or ulcerations noted.  Neck: Mild cervical lymphadenopathy. Neck supple, trachea midline. No massses, lumps or thyromegaly present.  Cardiovascular: Tachycardic. S1,S2 noted.  No murmur, rubs or gallops noted. No JVD or BLE edema. No carotid bruits noted. Pulmonary/Chest: Normal effort and bilateral inspiratory and expiratory wheezing and scattered rhonchi. No respiratory distress. No ronchi. Abd: S/NT trace to  1+ edema on B ankles  Lab Results  Component Value Date   WBC 17.4* 07/12/2014   HGB 12.4 07/12/2014   HCT 38.3 07/12/2014   PLT 377.0 07/12/2014   GLUCOSE 191 09/09/2014   CHOL 217* 07/12/2014   TRIG 293.0* 07/12/2014   HDL 15.90* 07/12/2014   LDLDIRECT 160.1 07/12/2014   LDLCALC * 02/20/2011    136        Total Cholesterol/HDL:CHD Risk Coronary Heart Disease Risk Table                     Men   Women  1/2 Average Risk   3.4   3.3  Average Risk       5.0   4.4  2 X Average Risk   9.6   7.1  3 X Average Risk  23.4   11.0        Use the calculated Patient Ratio above and the CHD Risk Table to determine the patient's CHD Risk.        ATP III  CLASSIFICATION (LDL):  <100     mg/dL   Optimal  100-129  mg/dL   Near or Above                    Optimal  130-159  mg/dL   Borderline  160-189  mg/dL   High  >190     mg/dL   Very High   ALT 38* 07/12/2014   AST 45* 07/12/2014   NA 134* 07/12/2014   K 3.6 07/12/2014   CL 95* 07/12/2014   CREATININE 0.9 07/12/2014   BUN 13 07/12/2014   CO2 29 07/12/2014   TSH 0.93 04/27/2013   PSA 0.02* 01/01/2008   HGBA1C 9.3* 07/12/2014     Assessment & Plan:

## 2015-05-03 NOTE — Assessment & Plan Note (Addendum)
Pt refused labs - "no need - it is high" Improve control, Rx and diet. Titrate up insulin

## 2015-05-03 NOTE — Assessment & Plan Note (Signed)
Not on Rx 

## 2015-05-03 NOTE — Assessment & Plan Note (Signed)
Try estro cream

## 2015-05-03 NOTE — Progress Notes (Signed)
Pre visit review using our clinic review tool, if applicable. No additional management support is needed unless otherwise documented below in the visit note. 

## 2015-05-03 NOTE — Progress Notes (Signed)
[facility] [address] [place], [place] [address] Phone: [phone] Subjective:    I'm seeing this patient by the request  of:  Walker Kehr, MD   CC: Left shoulder pain  [id number] Pam Gamble is a 69 y.o. female coming in with complaint of left shoulder pain. Patient was seen previously and did have a subacromial bursitis as well as moderate osteophytic changes of the shoulder. Patient states that after the injection and doing the home exercises and icing intermittently she is approximately 80-85% better. Patient states that if she keeps her arm behind her back for a long amount of time gives her some difficulty. Denies any nighttime awakening, denies any numbness or tingling. Overall states that she is improving. Able to do all activities of daily living as well as her hobbies including fishing and hunting.  Past Medical History  Diagnosis Date  . Calcium deposits in tendon and bursa   . Esophageal reflux   . Personal history of malignant neoplasm of other parts of uterus   . Lateral epicondylitis  of elbow   . Personal history of other diseases of digestive system   . Barrett's esophagus   . Chronic airway obstruction, not elsewhere classified   . Depression   . Shortness of breath     upon exertion  . Diabetes mellitus 2008ish    type two  . Chronic kidney disease     kidney stone  . Idiopathic thrombocythemia    Past Surgical History  Procedure Laterality Date  . Tonsillectomy and adenoidectomy    . Abdominal hysterectomy    . Splenectomy  1999   History  Substance Use Topics  . Smoking status: Former Smoker    Quit date: 11/11/2004  . Smokeless tobacco: Never Used  . Alcohol Use: No   Allergies  Allergen Reactions  . Amoxicillin-Pot Clavulanate Nausea Only and Other (See Comments)    REACTION: diarrhea "sick to stomach"   Family History  Problem Relation Age of Onset  . Liver cancer Sister   . Cancer Sister    pancreas  . Stroke Other   . Heart disease Mother     CHF  . Heart disease Father     MI        Past medical history, social, surgical and family history all reviewed in electronic medical record.   Review of Systems: No headache, visual changes, nausea, vomiting, diarrhea, constipation, dizziness, abdominal pain, skin rash, fevers, chills, night sweats, weight loss, swollen lymph nodes, body aches, joint swelling, muscle aches, chest pain, shortness of breath, mood changes.   Objective Blood pressure 138/82, pulse 91, height 5\' 2"  (1.575 m), weight 156 lb (70.761 kg), SpO2 93 %.  General: No apparent distress alert and oriented x3 mood and affect normal, dressed appropriately.  HEENT: Pupils equal, extraocular movements intact  Respiratory: Patient's speak in full sentences and does not appear short of breath  Cardiovascular: No lower extremity edema, non tender, no erythema  Skin: Warm dry intact with no signs of infection or rash on extremities or on axial skeleton.  Abdomen: Soft nontender  Neuro: Cranial nerves II through XII are intact, neurovascularly intact in all extremities with 2+ DTRs and 2+ pulses.  Lymph: No lymphadenopathy of posterior or anterior cervical chain or axillae bilaterally.  Gait normal with good balance and coordination.  MSK:  Non tender with full range of motion and good stability and symmetric strength and tone of  elbows, wrist, hip,  knee and ankles bilaterally.  Shoulder: left Inspection reveals no abnormalities, atrophy or asymmetry. Palpation is normal with no tenderness over AC joint or bicipital groove. Mild limitation in external range of motion but same on contralateral arm Rotator cuff strength 4 +/ 5 but symmetric signs of impingement with positive Neer and Hawkin's tests, but negative empty can sign . Less than previous exam Speeds and Yergason's tests normal. Mild positive labral pathology Normal scapular function observed. No painful arc  and no drop arm sign. No apprehension sign Contralateral shoulder has some mild decreased range of motion as well.       Impression and Recommendations:     This case required medical decision making of moderate complexity.

## 2015-05-03 NOTE — Patient Instructions (Signed)
Good to see you Ice is your friend Ice 20 minutes 2 times daily. Usually after activity and before bed. Exercises 3 times a week for another 6 weeks.  Can add tylenol to the aleve if needed.  See me when you need me.

## 2015-05-08 ENCOUNTER — Other Ambulatory Visit: Payer: Self-pay

## 2015-05-12 ENCOUNTER — Telehealth: Payer: Self-pay | Admitting: Internal Medicine

## 2015-05-12 NOTE — Telephone Encounter (Signed)
Called patient to see if a recent mammogram has been done patient declined.

## 2015-05-29 ENCOUNTER — Other Ambulatory Visit: Payer: Self-pay | Admitting: Internal Medicine

## 2015-07-03 LAB — HM DIABETES EYE EXAM

## 2015-07-06 ENCOUNTER — Encounter: Payer: Self-pay | Admitting: Internal Medicine

## 2015-07-10 ENCOUNTER — Telehealth: Payer: Self-pay | Admitting: Internal Medicine

## 2015-07-10 NOTE — Telephone Encounter (Signed)
Patient is requesting 2 referrals:  Dr. Honor Loh [facility]  Dx is follow up for kidney stones  appt is scheduled 07/14/2015 @ 8:15am   AND  Dr. Myrtie [facility]  Dx is for yeast infections appt is scheduled for 07/24/2015 @ 2:30

## 2015-07-11 NOTE — Telephone Encounter (Signed)
Dr. Janice Norrie Josem Kaufmann #[id number] valid 07/11/2015 - 01/07/2016 for 6 visits  Dr. Nelda Marseille Jose[facility] Kaufmann #[id number] valid 07/24/2015 - 01/20/2016 for 6 visits

## 2015-07-24 ENCOUNTER — Other Ambulatory Visit: Payer: Self-pay | Admitting: Obstetrics & Gynecology

## 2015-07-24 ENCOUNTER — Other Ambulatory Visit (HOSPITAL_COMMUNITY)
Admission: RE | Admit: 2015-07-24 | Discharge: 2015-07-24 | Disposition: A | Discharge status: Home / Self Care | Payer: Commercial Managed Care - HMO | Source: Ambulatory Visit | Attending: Obstetrics & Gynecology | Admitting: Obstetrics & Gynecology

## 2015-07-24 DIAGNOSIS — Z1151 Encounter for screening for human papillomavirus (HPV): Secondary | ICD-10-CM | POA: Insufficient documentation

## 2015-07-24 DIAGNOSIS — Z124 Encounter for screening for malignant neoplasm of cervix: Secondary | ICD-10-CM | POA: Insufficient documentation

## 2015-08-02 LAB — CYTOLOGY - PAP

## 2015-08-03 ENCOUNTER — Ambulatory Visit: Payer: Commercial Managed Care - HMO | Admitting: Internal Medicine

## 2015-08-13 ENCOUNTER — Encounter: Payer: Self-pay | Admitting: Internal Medicine

## 2015-08-26 ENCOUNTER — Ambulatory Visit (INDEPENDENT_AMBULATORY_CARE_PROVIDER_SITE_OTHER): Payer: Commercial Managed Care - HMO | Admitting: Family Medicine

## 2015-08-26 ENCOUNTER — Encounter: Payer: Self-pay | Admitting: Family Medicine

## 2015-08-26 VITALS — BP 136/78 | HR 108 | Temp 98.3°F | Ht 62.0 in | Wt 161.0 lb

## 2015-08-26 DIAGNOSIS — E1165 Type 2 diabetes mellitus with hyperglycemia: Secondary | ICD-10-CM | POA: Diagnosis not present

## 2015-08-26 DIAGNOSIS — Z794 Long term (current) use of insulin: Secondary | ICD-10-CM

## 2015-08-26 DIAGNOSIS — J441 Chronic obstructive pulmonary disease with (acute) exacerbation: Secondary | ICD-10-CM | POA: Diagnosis not present

## 2015-08-26 DIAGNOSIS — IMO0001 Reserved for inherently not codable concepts without codable children: Secondary | ICD-10-CM

## 2015-08-26 MED ORDER — PREDNISONE 20 MG PO TABS: ORAL_TABLET | ORAL | Status: DC

## 2015-08-26 MED ORDER — DOXYCYCLINE HYCLATE 100 MG PO TABS: 100.0000 mg | ORAL_TABLET | Freq: Two times a day (BID) | ORAL | Status: DC

## 2015-08-26 NOTE — Progress Notes (Signed)
BP 136/78 mmHg  Pulse 108  Temp(Src) 98.3 F (36.8 C) (Oral)  Ht 5\' 2"  (1.575 m)  Wt 161 lb (73.029 kg)  BMI 29.44 kg/m2  SpO2 94%   CC: "my breathing and coughing got real bad"  Subjective:    Patient ID: Pam Gamble, female    DOB: 10-01-1946, 69 y.o.   MRN: [id number]  HPI: Pam Gamble is a 69 y.o. female presenting on 08/26/2015 for Cough and Wheezing   Over last 2 days noticing increased dyspnea, cough, and wheezing. Chest congestion with increased sputum production but difficulty expectorating mucous.   Denies fevers/chills, ear pain, headaches. No sick contacts at home.  No smokers at home.  After honey lemon and white liquor in coffee this morning, felt some better.  Known COPD on dulera 2 puffs BID and duonebs BID scheduled, quit smoking 2006. H/o insulin dependent diabetes, CKD  Relevant past medical, surgical, family and social history reviewed and updated as indicated. Interim medical history since our last visit reviewed. Allergies and medications reviewed and updated. Current Outpatient Prescriptions on File Prior to Visit  Medication Sig  . ACCU-CHEK FASTCLIX LANCETS MISC 1 Units by Does not apply route 2 (two) times daily.  Marland Kitchen albuterol (PROVENTIL HFA;VENTOLIN HFA) 108 (90 BASE) MCG/ACT inhaler Inhale 2 puffs into the lungs every 6 (six) hours as needed for wheezing.  Marland Kitchen azelastine (ASTELIN) 137 MCG/SPRAY nasal spray Place 2 sprays into the nose 2 (two) times daily. Use in each nostril as directed  . B Complex-C-Folic Acid (STRESS B COMPLEX PO) Take 1 capsule by mouth daily.  . Cetirizine HCl (ZYRTEC PO) Take 1 tablet by mouth daily.  . cholecalciferol (VITAMIN D) 1000 UNITS tablet Take 5,000 Units by mouth daily.  Marland Kitchen conjugated estrogens (PREMARIN) vaginal cream Use 1 filled applicator qhs x 1 week, then once weekly  . fluticasone (FLONASE) 50 MCG/ACT nasal spray Place 2 sprays into the nose daily as needed for rhinitis.  . furosemide (LASIX) 20 MG tablet  Take 1-2 tablets (20-40 mg total) by mouth daily as needed for fluid or edema.  Marland Kitchen GINKGO BILOBA COMPLEX PO Take 1 capsule by mouth daily.  Marland Kitchen glucose blood (BAYER CONTOUR NEXT TEST) test strip 1 each by Other route daily as needed for other. Use as instructed  . Insulin Detemir (LEVEMIR) 100 UNIT/ML Pen Sig: Use 14 units daily for glucose 100-200  Use 16 units daily for glucose 201-250  Use 18 units daily for glucose 251-300  Use 20 units daily for glucose >351  Use 4 units daily for glucose <100  . ipratropium-albuterol (DUONEB) 0.5-2.5 (3) MG/3ML SOLN Take 3 mLs by nebulization every 6 (six) hours as needed.  . metFORMIN (GLUCOPHAGE) 1000 MG tablet TAKE ONE TABLET BY MOUTH TWICE DAILY WITH MEALS  . omeprazole (PRILOSEC) 40 MG capsule Take 1 capsule (40 mg total) by mouth daily.  . traMADol (ULTRAM) 50 MG tablet Take 1-2 tablets (50-100 mg total) by mouth 2 (two) times daily as needed.  . fluconazole (DIFLUCAN) 150 MG tablet Take 1 tablet (150 mg total) by mouth once. (Patient not taking: Reported on 08/26/2015)  . mometasone-formoterol (DULERA) 200-5 MCG/ACT AERO Inhale 2 puffs into the lungs 2 (two) times daily. (Patient not taking: Reported on 08/26/2015)   No current facility-administered medications on file prior to visit.    Review of Systems Per HPI unless specifically indicated above     Objective:    BP 136/78 mmHg  Pulse 108  Temp(Src)  98.3 F (36.8 C) (Oral)  Ht 5\' 2"  (1.575 m)  Wt 161 lb (73.029 kg)  BMI 29.44 kg/m2  SpO2 94%  Wt Readings from Last 3 Encounters:  08/26/15 161 lb (73.029 kg)  05/03/15 156 lb (70.761 kg)  05/03/15 156 lb (70.761 kg)    Physical Exam  Constitutional: She appears well-developed and well-nourished. No distress.  nontoxic  HENT:  Head: Normocephalic and atraumatic.  Right Ear: Hearing, tympanic membrane, external ear and ear canal normal.  Left Ear: Hearing, tympanic membrane, external ear and ear canal normal.  Nose: No mucosal  edema or rhinorrhea. Right sinus exhibits no maxillary sinus tenderness and no frontal sinus tenderness. Left sinus exhibits no maxillary sinus tenderness and no frontal sinus tenderness.  Mouth/Throat: Uvula is midline, oropharynx is clear and moist and mucous membranes are normal. No oropharyngeal exudate, posterior oropharyngeal edema, posterior oropharyngeal erythema or tonsillar abscesses.  Eyes: Conjunctivae and EOM are normal. Pupils are equal, round, and reactive to light. No scleral icterus.  Neck: Normal range of motion. Neck supple.  Cardiovascular: Normal rate, regular rhythm, normal heart sounds and intact distal pulses.   No murmur heard. Pulmonary/Chest: Effort normal. No respiratory distress. She has decreased breath sounds. She has wheezes (diffuse exp, few insp). She has rhonchi. She has no rales.  Decreased air movement with monosymphonic wheezing throughout, normal work of breathing  Lymphadenopathy:    She has no cervical adenopathy.  Skin: Skin is warm and dry. No rash noted.  Nursing note and vitals reviewed.     Assessment & Plan:   Problem List Items Addressed This Visit    Diabetes type 2, uncontrolled ([place])    Advised caution with carbohydrates while on prednisone.       C O P D WITH ACUTE EXACERBATION - Primary    Story/exam consistent with COPD exacerbation. Treat with 1wk prednisone taper and 10d doxycycline antibiotic course. rec schedule duoneb Q4-6 hours over next 48 hours. Red flags to seek further care discussed. Pt agrees with plan.      Relevant Medications   predniSONE (DELTASONE) 20 MG tablet       Follow up plan: No Follow-up on file.

## 2015-08-26 NOTE — Progress Notes (Signed)
Pre visit review using our clinic review tool, if applicable. No additional management support is needed unless otherwise documented below in the visit note. 

## 2015-08-26 NOTE — Assessment & Plan Note (Signed)
Advised caution with carbohydrates while on prednisone.

## 2015-08-26 NOTE — Assessment & Plan Note (Signed)
Story/exam consistent with COPD exacerbation. Treat with 1wk prednisone taper and 10d doxycycline antibiotic course. rec schedule duoneb Q4-6 hours over next 48 hours. Red flags to seek further care discussed. Pt agrees with plan.

## 2015-08-26 NOTE — Patient Instructions (Addendum)
You have COPD flare - treat with doxycycline 10d and prednisone course. Schedule duonebs every 4-6 hours for next 2 days. Get lots of rest, stay well hydrated with plenty of water. Return if worsening breathing or fever >101.

## 2015-09-29 ENCOUNTER — Telehealth: Payer: Self-pay

## 2015-09-29 ENCOUNTER — Other Ambulatory Visit: Payer: Self-pay | Admitting: Internal Medicine

## 2015-09-29 MED ORDER — PREDNISONE 20 MG PO TABS: ORAL_TABLET | ORAL | Status: DC

## 2015-09-29 NOTE — Telephone Encounter (Signed)
LM on triage.  Pt is having difficulty breathing and is request a prescription for prednisone

## 2015-09-29 NOTE — Telephone Encounter (Signed)
Ok, done Thx  

## 2015-10-02 NOTE — Telephone Encounter (Signed)
Pt stated they picked up rx on Saturday.

## 2015-10-30 ENCOUNTER — Telehealth: Payer: Self-pay | Admitting: Internal Medicine

## 2015-10-30 DIAGNOSIS — J441 Chronic obstructive pulmonary disease with (acute) exacerbation: Secondary | ICD-10-CM

## 2015-10-30 NOTE — Telephone Encounter (Signed)
Pt called in and said that plot normally give him antibiotic to keep on hand if he gets a fever.  He wants to know if he can get some more called in???  He is out?    pharmacy [facility] in [place]

## 2015-10-31 MED ORDER — LEVOFLOXACIN 500 MG PO TABS: 500.0000 mg | ORAL_TABLET | Freq: Every day | ORAL | Status: DC

## 2015-10-31 NOTE — Telephone Encounter (Signed)
Done. It is Ms Taleah Sumerlin

## 2015-10-31 NOTE — Telephone Encounter (Signed)
Pt called to check up this request. Please give him a call

## 2015-11-01 ENCOUNTER — Telehealth: Payer: Self-pay | Admitting: Pulmonary Disease

## 2015-11-01 NOTE — Telephone Encounter (Signed)
LVM for patient to return call. 

## 2015-11-01 NOTE — Telephone Encounter (Signed)
LVM for patient to return call.  Please schedule patient with first available physician for a consult.  Patient has not been seen since 2011.

## 2015-11-01 NOTE — Telephone Encounter (Signed)
Offered first available appt and sched for  11/23/15 w/ PM, patient states needs to be seen ASAP, informed patient if they can not wait to go to ER due to not being seen in our office since 2011, patient states they are going to ER, nothing further needed

## 2015-11-02 NOTE — Telephone Encounter (Signed)
Pt went to UC yesterday.  Pt has PNA and was given steroids and abx.  Nothing further was needed at this time. Pt will keep calling back to check for sooner appt.

## 2015-11-15 ENCOUNTER — Encounter: Payer: Self-pay | Admitting: Internal Medicine

## 2015-11-15 DIAGNOSIS — H43813 Vitreous degeneration, bilateral: Secondary | ICD-10-CM | POA: Diagnosis not present

## 2015-11-15 DIAGNOSIS — H52203 Unspecified astigmatism, bilateral: Secondary | ICD-10-CM | POA: Diagnosis not present

## 2015-11-15 DIAGNOSIS — H2513 Age-related nuclear cataract, bilateral: Secondary | ICD-10-CM | POA: Diagnosis not present

## 2015-11-15 DIAGNOSIS — E119 Type 2 diabetes mellitus without complications: Secondary | ICD-10-CM | POA: Diagnosis not present

## 2015-11-15 LAB — HM DIABETES EYE EXAM

## 2015-11-23 ENCOUNTER — Ambulatory Visit (INDEPENDENT_AMBULATORY_CARE_PROVIDER_SITE_OTHER): Payer: PPO | Admitting: Pulmonary Disease

## 2015-11-23 ENCOUNTER — Encounter: Payer: Self-pay | Admitting: Pulmonary Disease

## 2015-11-23 VITALS — BP 140/76 | HR 104 | Ht 62.0 in | Wt 156.5 lb

## 2015-11-23 DIAGNOSIS — J439 Emphysema, unspecified: Secondary | ICD-10-CM | POA: Diagnosis not present

## 2015-11-23 MED ORDER — FLUTICASONE-SALMETEROL 500-50 MCG/DOSE IN AEPB: 1.0000 | INHALATION_SPRAY | Freq: Two times a day (BID) | RESPIRATORY_TRACT | Status: DC

## 2015-11-23 NOTE — Progress Notes (Signed)
   Subjective:    Patient ID: Pam Gamble, female    DOB: 03/10/1946, 70 y.o.   MRN: [id number]  HPI Consult for evaluation of COPD, dyspnea.  Pam Gamble is a 70 year old with severe COPD, asthmatic bronchitis heavy smoking history. She is a former patient of Dr. Joya Gaskins but has not seen him since 2013. She is not currently on any long-term controller inhalers. She was on dulera in the past and reports improvement in symptoms and it. However she did not continue using it as this was not covered by her insurance. She is just using albuterol rescue inhaler and  duonebs as needed.  She reports worsening symptoms over the past 1 year. She was hospitalized for a few days in 2015. In 2016 before Christmas she had to go to the urgent care for acute exacerbation. She was treated with antibiotics and steroids. She reports that her symptoms have improved but not back to baseline yet. She loves going hunting fishing and would like to improve to a stage where she can enjoy these activities again.  She smoked 2 pack a day for 40 years. She quit in 2007. Takes alcohol occasionally. She is single and lives by herself.  DATA: PFTs 06/27/15 FVC 1.69 (59%) FEV1 0.98 (47%)  F/F 58 TLC 87% DLCO 46%. Severe obstructive lung disease with reduction in diffusion capacity. No bronchodilator response.  CXR 12/21/13 No active cardiopulmonary disease.  Past Medical History  Diagnosis Date  . Calcium deposits in tendon and bursa   . Esophageal reflux   . Personal history of malignant neoplasm of other parts of uterus   . Lateral epicondylitis  of elbow   . Personal history of other diseases of digestive system   . Barrett's esophagus   . Chronic airway obstruction, not elsewhere classified   . Depression   . Shortness of breath     upon exertion  . Diabetes mellitus 2008ish    type two  . Chronic kidney disease     kidney stone  . Idiopathic thrombocythemia     Review of Systems Dyspnea on exertion,  cough, sputum production, wheezing. Denies any fevers, chills, hemoptysis. Denies any chest pain, palpitations. Denies any malaise, fatigue, loss of weight, appetite. Denies any nausea, vomiting, diarrhea, constipation. All other review of systems are negative.    Objective:   Physical Exam Blood pressure 140/76, pulse 104, height 5\' 2"  (1.575 m), weight 156 lb 8 oz (70.988 kg), SpO2 93 %.  Gen: No apparent distress Neuro: No gross focal deficits. Neck: No JVD, lymphadenopathy, thyromegaly. RS: Clear, no wheeze, crackles CVS: S1-S2 heard, no murmurs rubs gallops. Abdomen: Soft, positive bowel sounds. Extremities: No edema.    Assessment & Plan:  Severe COPD, asthmatic bronchitis  She's not currently optimized with her long-term controller inhalers. She had responded well to [facility] in the past but is not covered by insurance. I will try Advair. She will continue to use her albuterol rescue inhaler and duo nebs as needed.  She'll return to clinic in 1-2 months to assess response to therapy. She will be a candidate for pulmonary rehabilitation and for CT screening. I will discuss this at the next visit.  [name] Garfinkel MD [facility] Pager [phone] If no answer or after 3pm call: [phone] 11/23/2015, 4:36 PM

## 2015-11-23 NOTE — Patient Instructions (Signed)
We will start you on Advair 500/50. Continue using the DuoNeb and albuterol rescue inhaler.  Return to clinic in 2 months.

## 2015-11-28 ENCOUNTER — Telehealth: Payer: Self-pay

## 2015-11-28 NOTE — Telephone Encounter (Signed)
Outreach regarding AWV tomorrow; States she is planning to come in and has been on advair and feels better; Discussed A1c and has not checked this week; but knows it has been high especially when she is sick. The patient states she is better now. Stated to stay home if sick. Instructed that Dr. Alain Marion wanted to see her in Sept of 2016; (she refused to have blood drawn) and told her that she needed to make apt. Stated that she can expect to have her labs drawn tomorrow so as to keep an eye on A1c since she is feeling better.

## 2015-11-29 ENCOUNTER — Ambulatory Visit (INDEPENDENT_AMBULATORY_CARE_PROVIDER_SITE_OTHER): Payer: PPO

## 2015-11-29 VITALS — BP 120/80 | HR 80 | Ht 62.0 in | Wt 152.2 lb

## 2015-11-29 DIAGNOSIS — Z0000 Encounter for general adult medical examination without abnormal findings: Secondary | ICD-10-CM

## 2015-11-29 DIAGNOSIS — E1165 Type 2 diabetes mellitus with hyperglycemia: Secondary | ICD-10-CM

## 2015-11-29 DIAGNOSIS — Z1159 Encounter for screening for other viral diseases: Secondary | ICD-10-CM

## 2015-11-29 DIAGNOSIS — Z780 Asymptomatic menopausal state: Secondary | ICD-10-CM | POA: Diagnosis not present

## 2015-11-29 NOTE — Patient Instructions (Addendum)
Pam Gamble , Thank you for taking time to come for your Medicare Wellness Visit. I appreciate your ongoing commitment to your health goals. Please review the following plan we discussed and let me know if I can assist you in the future.   Will fup with pulmonology in a couple of months Will fup with Plotnikov and will have overdue labs prior to seeing him  Will have hep c at that time Ua microablumin  Will see Plotnikov to discuss colonosocpy in 2017    Diabetes and Nurtrition management of [facility] [email or web address]  Deaf & Hard of Hearing Division Services  No reviews  [employer] Office  [address]  [phone] can assist with hearing aid   Will schedule Bone density when she gets blood work drawn  Mammogram to be scheduled  Will consider going back to the gym; try the silver sneaker program ---- These are the goals we discussed: Goals    . patient     Stay healthy enough to go hunting and fishing;  Staying out of cold weather or appropriate garments; To wear mask; can try kerchief;   Keep doctors apt       This is a list of the screening recommended for you and due dates:  Health Maintenance  Topic Date Due  .  Hepatitis C: One time screening is recommended by Center for Disease Control  (CDC) for  adults born from [birth year] through 1965.   1946/07/26  . Complete foot exam   05/15/1956  . Urine Protein Check  05/15/1956  . Colon Cancer Screening  05/15/1996  . Mammogram  02/23/2010  . DEXA scan (bone density measurement)  05/16/2011  . Hemoglobin A1C  01/10/2015  . Flu Shot  06/11/2016  . Eye exam for diabetics  11/14/2016  . Tetanus Vaccine  01/29/2023  . Shingles Vaccine  Completed  . Pneumonia vaccines  Completed     Bone Densitometry Bone densitometry is an imaging test that uses a special X-ray to measure the amount of calcium and other minerals in your bones (bone  density). This test is also known as a bone mineral density test or dual-energy X-ray absorptiometry (DXA). The test can measure bone density at your hip and your spine. It is similar to having a regular X-ray. You may have this test to:  Diagnose a condition that causes weak or thin bones (osteoporosis).  Predict your risk of a broken bone (fracture).  Determine how well osteoporosis treatment is working. LET [facility] CARE PROVIDER KNOW ABOUT:  Any allergies you have.  All medicines you are taking, including vitamins, herbs, eye drops, creams, and over-the-counter medicines.  Previous problems you or members of your family have had with the use of anesthetics.  Any blood disorders you have.  Previous surgeries you have had.  Medical conditions you have.  Possibility of pregnancy.  Any other medical test you had within the previous 14 days that used contrast material. RISKS AND COMPLICATIONS Generally, this is a safe procedure. However, problems can occur and may include the following:  This test exposes you to a very small amount of radiation.  The risks of radiation exposure may be greater to unborn children. BEFORE THE PROCEDURE  Do not take any calcium supplements for 24 hours before having the test. You can otherwise eat and drink what you usually do.  Take off all metal jewelry, eyeglasses, dental appliances, and any other metal objects. PROCEDURE  You may lie on  an exam table. There will be an X-ray generator below you and an imaging device above you.  Other devices, such as boxes or braces, may be used to position your body properly for the scan.  You will need to lie still while the machine slowly scans your body.  The images will show up on a computer monitor. AFTER THE PROCEDURE You may need more testing at a later time.   This information is not intended to replace advice given to you by your health care provider. Make sure you discuss any questions you  have with your health care provider.   Document Released: 11/19/2004 Document Revised: 11/18/2014 Document Reviewed: 04/07/2014 Elsevier Interactive Patient Education 2016 [employer].  Colonoscopy A colonoscopy is an exam to look at the entire large intestine (colon). This exam can help find problems such as tumors, polyps, inflammation, and areas of bleeding. The exam takes about 1 hour.  LET [facility] CARE PROVIDER KNOW ABOUT:   Any allergies you have.  All medicines you are taking, including vitamins, herbs, eye drops, creams, and over-the-counter medicines.  Previous problems you or members of your family have had with the use of anesthetics.  Any blood disorders you have.  Previous surgeries you have had.  Medical conditions you have. RISKS AND COMPLICATIONS  Generally, this is a safe procedure. However, as with any procedure, complications can occur. Possible complications include:  Bleeding.  Tearing or rupture of the colon wall.  Reaction to medicines given during the exam.  Infection (rare). BEFORE THE PROCEDURE   Ask your health care provider about changing or stopping your regular medicines.  You may be prescribed an oral bowel prep. This involves drinking a large amount of medicated liquid, starting the day before your procedure. The liquid will cause you to have multiple loose stools until your stool is almost clear or light green. This cleans out your colon in preparation for the procedure.  Do not eat or drink anything else once you have started the bowel prep, unless your health care provider tells you it is safe to do so.  Arrange for someone to drive you home after the procedure. PROCEDURE   You will be given medicine to help you relax (sedative).  You will lie on your side with your knees bent.  A long, flexible tube with a light and camera on the end (colonoscope) will be inserted through the rectum and into the colon. The camera sends video back  to a computer screen as it moves through the colon. The colonoscope also releases carbon dioxide gas to inflate the colon. This helps your health care provider see the area better.  During the exam, your health care provider may take a small tissue sample (biopsy) to be examined under a microscope if any abnormalities are found.  The exam is finished when the entire colon has been viewed. AFTER THE PROCEDURE   Do not drive for 24 hours after the exam.  You may have a small amount of blood in your stool.  You may pass moderate amounts of gas and have mild abdominal cramping or bloating. This is caused by the gas used to inflate your colon during the exam.  Ask when your test results will be ready and how you will get your results. Make sure you get your test results.   This information is not intended to replace advice given to you by your health care provider. Make sure you discuss any questions you have with your  health care provider.   Document Released: 10/25/2000 Document Revised: 08/18/2013 Document Reviewed: 07/05/2013 Elsevier Interactive Patient Education 2016 [place] for Type 2 Diabetes Screening is a way to check for type 2 diabetes in people who do not have symptoms of the disease, but who may likely develop diabetes in the future. Diabetes can lead to serious health problems, but finding diabetes early allows for early treatment. DIABETES RISK FACTORS   Family history of diabetes.  Diseases of the pancreas.  Obesity or being overweight.  Certain racial or ethnic groups:  American [place].  Pacific Islander.  Hispanic.  Asian.  African American.  High blood pressure (hypertension).  History of diabetes while pregnant (gestational diabetes).  Delivering a baby that weighed over 9 pounds.  Being inactive.  High cholesterol or triglycerides.  Age, especially over 56 years of age.  Other diseases or conditions.  Diseases of the  pancreas.  Cardiovascular disease.  Disorders of the endocrine system.  Certain medicines, such as those that treat high blood cholesterol levels. WHO IS SCREENED Adults  Adults who have no risk factors and no symptoms should be screened starting at age 28. If the screening tests are normal, they should be repeated every 3 years.  Adults who do not have symptoms, but have 1 or more risk factors, should be screened.  Adults who have 2 or more risk factors may be screened every year.  Adults who have an A1c (3 month average of blood glucose) greater than 5.7% or who had an impaired glucose tolerance (IGT) or impaired fasting glucose (IFG) on a previous test should be screened.  Pregnant women who have risk factors should be screened at their first prenatal visit.  Women who have given birth and had gestational diabetes should be screened 6-12 weeks after the child is born. This screening should be repeated every 1-3 years after the first test. Children or Adolescents  Children and adolescents should be screened for type 2 diabetes if they are overweight and have 2 of the following risk factors:  Having a family history of type 2 diabetes.  Being a member of a high risk race or ethnic group.  Having signs of insulin resistance or conditions associated with insulin resistance.  Having a mother who had gestational diabetes while pregnant with him or her.  Screening should start at age 35 or at the onset of puberty, whichever comes first. This should be repeated every 2 years. SCREENING In a screening, your caregiver may:  Ask questions about your overall health. This will include questions about the health of close family members, too.  Ask about any diabetes-like symptoms you may have.  Perform a physical exam.  Order some tests that may include:  A fasting plasma glucose test. This measures the level of glucose in your blood. It is done after you have had nothing to eat but  water (fasted) for 8 hours.  A random blood glucose test. This test is done without the need to fast.  An oral glucose tolerance test. This is a blood test done in 2 parts. First, a blood sample is taken after you have fasted. Then, another sample is taken after you drink a liquid that contains a lot of sugar.  An A1c test. This test shows how much glucose has been in your blood over the past 2 to 3 months.   This information is not intended to replace advice given to you by your health care provider. Make sure you  discuss any questions you have with your health care provider.   Document Released: 08/24/2009 Document Revised: 11/18/2014 Document Reviewed: 06/05/2011 Elsevier Interactive Patient Education 2016 [place] in the Home  Falls can cause injuries. They can happen to people of all ages. There are many things you can do to make your home safe and to help prevent falls.  WHAT CAN I DO ON THE OUTSIDE OF MY HOME?  Regularly fix the edges of walkways and driveways and fix any cracks.  Remove anything that might make you trip as you walk through a door, such as a raised step or threshold.  Trim any bushes or trees on the path to your home.  Use bright outdoor lighting.  Clear any walking paths of anything that might make someone trip, such as rocks or tools.  Regularly check to see if handrails are loose or broken. Make sure that both sides of any steps have handrails.  Any raised decks and porches should have guardrails on the edges.  Have any leaves, snow, or ice cleared regularly.  Use sand or salt on walking paths during winter.  Clean up any spills in your garage right away. This includes oil or grease spills. WHAT CAN I DO IN THE BATHROOM?   Use night lights.  Install grab bars by the toilet and in the tub and shower. Do not use towel bars as grab bars.  Use non-skid mats or decals in the tub or shower.  If you need to sit down in the shower,  use a plastic, non-slip stool.  Keep the floor dry. Clean up any water that spills on the floor as soon as it happens.  Remove soap buildup in the tub or shower regularly.  Attach bath mats securely with double-sided non-slip rug tape.  Do not have throw rugs and other things on the floor that can make you trip. WHAT CAN I DO IN THE BEDROOM?  Use night lights.  Make sure that you have a light by your bed that is easy to reach.  Do not use any sheets or blankets that are too big for your bed. They should not hang down onto the floor.  Have a firm chair that has side arms. You can use this for support while you get dressed.  Do not have throw rugs and other things on the floor that can make you trip. WHAT CAN I DO IN THE KITCHEN?  Clean up any spills right away.  Avoid walking on wet floors.  Keep items that you use a lot in easy-to-reach places.  If you need to reach something above you, use a strong step stool that has a grab bar.  Keep electrical cords out of the way.  Do not use floor polish or wax that makes floors slippery. If you must use wax, use non-skid floor wax.  Do not have throw rugs and other things on the floor that can make you trip. WHAT CAN I DO WITH MY STAIRS?  Do not leave any items on the stairs.  Make sure that there are handrails on both sides of the stairs and use them. Fix handrails that are broken or loose. Make sure that handrails are as long as the stairways.  Check any carpeting to make sure that it is firmly attached to the stairs. Fix any carpet that is loose or worn.  Avoid having throw rugs at the top or bottom of the stairs. If you do have throw rugs,  attach them to the floor with carpet tape.  Make sure that you have a light switch at the top of the stairs and the bottom of the stairs. If you do not have them, ask someone to add them for you. WHAT ELSE CAN I DO TO HELP PREVENT FALLS?  Wear shoes that:  Do not have high heels.  Have  rubber bottoms.  Are comfortable and fit you well.  Are closed at the toe. Do not wear sandals.  If you use a stepladder:  Make sure that it is fully opened. Do not climb a closed stepladder.  Make sure that both sides of the stepladder are locked into place.  Ask someone to hold it for you, if possible.  Clearly mark and make sure that you can see:  Any grab bars or handrails.  First and last steps.  Where the edge of each step is.  Use tools that help you move around (mobility aids) if they are needed. These include:  Canes.  Walkers.  Scooters.  Crutches.  Turn on the lights when you go into a dark area. Replace any light bulbs as soon as they burn out.  Set up your furniture so you have a clear path. Avoid moving your furniture around.  If any of your floors are uneven, fix them.  If there are any pets around you, be aware of where they are.  Review your medicines with your doctor. Some medicines can make you feel dizzy. This can increase your chance of falling. Ask your doctor what other things that you can do to help prevent falls.   This information is not intended to replace advice given to you by your health care provider. Make sure you discuss any questions you have with your health care provider.   Document Released: 08/24/2009 Document Revised: 03/14/2015 Document Reviewed: 12/02/2014 Elsevier Interactive Patient Education 2016 [place] Maintenance, Female Adopting a healthy lifestyle and getting preventive care can go a long way to promote health and wellness. Talk with your health care provider about what schedule of regular examinations is right for you. This is a good chance for you to check in with your provider about disease prevention and staying healthy. In between checkups, there are plenty of things you can do on your own. Experts have done a lot of research about which lifestyle changes and preventive measures are most likely to  keep you healthy. Ask your health care provider for more information. WEIGHT AND DIET  Eat a healthy diet  Be sure to include plenty of vegetables, fruits, low-fat dairy products, and lean protein.  Do not eat a lot of foods high in solid fats, added sugars, or salt.  Get regular exercise. This is one of the most important things you can do for your health.  Most adults should exercise for at least 150 minutes each week. The exercise should increase your heart rate and make you sweat (moderate-intensity exercise).  Most adults should also do strengthening exercises at least twice a week. This is in addition to the moderate-intensity exercise.  Maintain a healthy weight  Body mass index (BMI) is a measurement that can be used to identify possible weight problems. It estimates body fat based on height and weight. Your health care provider can help determine your BMI and help you achieve or maintain a healthy weight.  For females 71 years of age and older:   A BMI below 18.5 is considered underweight.  A BMI of  18.5 to 24.9 is normal.  A BMI of 25 to 29.9 is considered overweight.  A BMI of 30 and above is considered obese.  Watch levels of cholesterol and blood lipids  You should start having your blood tested for lipids and cholesterol at 70 years of age, then have this test every 5 years.  You may need to have your cholesterol levels checked more often if:  Your lipid or cholesterol levels are high.  You are older than 70 years of age.  You are at high risk for heart disease.  CANCER SCREENING   Lung Cancer  Lung cancer screening is recommended for adults 64-24 years old who are at high risk for lung cancer because of a history of smoking.  A yearly low-dose CT scan of the lungs is recommended for people who:  Currently smoke.  Have quit within the past 15 years.  Have at least a 30-pack-year history of smoking. A pack year is smoking an average of one pack of  cigarettes a day for 1 year.  Yearly screening should continue until it has been 15 years since you quit.  Yearly screening should stop if you develop a health problem that would prevent you from having lung cancer treatment.  Breast Cancer  Practice breast self-awareness. This means understanding how your breasts normally appear and feel.  It also means doing regular breast self-exams. Let your health care provider know about any changes, no matter how small.  If you are in your 20s or 30s, you should have a clinical breast exam (CBE) by a health care provider every 1-3 years as part of a regular health exam.  If you are 29 or older, have a CBE every year. Also consider having a breast X-ray (mammogram) every year.  If you have a family history of breast cancer, talk to your health care provider about genetic screening.  If you are at high risk for breast cancer, talk to your health care provider about having an MRI and a mammogram every year.  Breast cancer gene (BRCA) assessment is recommended for women who have family members with BRCA-related cancers. BRCA-related cancers include:  Breast.  Ovarian.  Tubal.  Peritoneal cancers.  Results of the assessment will determine the need for genetic counseling and BRCA1 and BRCA2 testing. Cervical Cancer Your health care provider may recommend that you be screened regularly for cancer of the pelvic organs (ovaries, uterus, and vagina). This screening involves a pelvic examination, including checking for microscopic changes to the surface of your cervix (Pap test). You may be encouraged to have this screening done every 3 years, beginning at age 2.  For women ages 46-65, health care providers may recommend pelvic exams and Pap testing every 3 years, or they may recommend the Pap and pelvic exam, combined with testing for human papilloma virus (HPV), every 5 years. Some types of HPV increase your risk of cervical cancer. Testing for HPV  may also be done on women of any age with unclear Pap test results.  Other health care providers may not recommend any screening for nonpregnant women who are considered low risk for pelvic cancer and who do not have symptoms. Ask your health care provider if a screening pelvic exam is right for you.  If you have had past treatment for cervical cancer or a condition that could lead to cancer, you need Pap tests and screening for cancer for at least 20 years after your treatment. If Pap tests have been discontinued,  your risk factors (such as having a new sexual partner) need to be reassessed to determine if screening should resume. Some women have medical problems that increase the chance of getting cervical cancer. In these cases, your health care provider may recommend more frequent screening and Pap tests. Colorectal Cancer  This type of cancer can be detected and often prevented.  Routine colorectal cancer screening usually begins at 70 years of age and continues through 70 years of age.  Your health care provider may recommend screening at an earlier age if you have risk factors for colon cancer.  Your health care provider may also recommend using home test kits to check for hidden blood in the stool.  A small camera at the end of a tube can be used to examine your colon directly (sigmoidoscopy or colonoscopy). This is done to check for the earliest forms of colorectal cancer.  Routine screening usually begins at age 90.  Direct examination of the colon should be repeated every 5-10 years through 70 years of age. However, you may need to be screened more often if early forms of precancerous polyps or small growths are found. Skin Cancer  Check your skin from head to toe regularly.  Tell your health care provider about any new moles or changes in moles, especially if there is a change in a mole's shape or color.  Also tell your health care provider if you have a mole that is larger than  the size of a pencil eraser.  Always use sunscreen. Apply sunscreen liberally and repeatedly throughout the day.  Protect yourself by wearing long sleeves, pants, a wide-brimmed hat, and sunglasses whenever you are outside. HEART DISEASE, DIABETES, AND HIGH BLOOD PRESSURE   High blood pressure causes heart disease and increases the risk of stroke. High blood pressure is more likely to develop in:  People who have blood pressure in the high end of the normal range (130-139/85-89 mm Hg).  People who are overweight or obese.  People who are African American.  If you are 43-9 years of age, have your blood pressure checked every 3-5 years. If you are 51 years of age or older, have your blood pressure checked every year. You should have your blood pressure measured twice--once when you are at a hospital or clinic, and once when you are not at a hospital or clinic. Record the average of the two measurements. To check your blood pressure when you are not at a hospital or clinic, you can use:  An automated blood pressure machine at a pharmacy.  A home blood pressure monitor.  If you are between 12 years and 79 years old, ask your health care provider if you should take aspirin to prevent strokes.  Have regular diabetes screenings. This involves taking a blood sample to check your fasting blood sugar level.  If you are at a normal weight and have a low risk for diabetes, have this test once every three years after 70 years of age.  If you are overweight and have a high risk for diabetes, consider being tested at a younger age or more often. PREVENTING INFECTION  Hepatitis B  If you have a higher risk for hepatitis B, you should be screened for this virus. You are considered at high risk for hepatitis B if:  You were born in a country where hepatitis B is common. Ask your health care provider which countries are considered high risk.  Your parents were born in a high-risk country,  and you  have not been immunized against hepatitis B (hepatitis B vaccine).  You have HIV or AIDS.  You use needles to inject street drugs.  You live with someone who has hepatitis B.  You have had sex with someone who has hepatitis B.  You get hemodialysis treatment.  You take certain medicines for conditions, including cancer, organ transplantation, and autoimmune conditions. Hepatitis C  Blood testing is recommended for:  Everyone born from [birth year] through 1965.  Anyone with known risk factors for hepatitis C. Sexually transmitted infections (STIs)  You should be screened for sexually transmitted infections (STIs) including gonorrhea and chlamydia if:  You are sexually active and are younger than 70 years of age.  You are older than 70 years of age and your health care provider tells you that you are at risk for this type of infection.  Your sexual activity has changed since you were last screened and you are at an increased risk for chlamydia or gonorrhea. Ask your health care provider if you are at risk.  If you do not have HIV, but are at risk, it may be recommended that you take a prescription medicine daily to prevent HIV infection. This is called pre-exposure prophylaxis (PrEP). You are considered at risk if:  You are sexually active and do not regularly use condoms or know the HIV status of your partner(s).  You take drugs by injection.  You are sexually active with a partner who has HIV. Talk with your health care provider about whether you are at high risk of being infected with HIV. If you choose to begin PrEP, you should first be tested for HIV. You should then be tested every 3 months for as long as you are taking PrEP.  PREGNANCY   If you are premenopausal and you may become pregnant, ask your health care provider about preconception counseling.  If you may become pregnant, take 400 to 800 micrograms (mcg) of folic acid every day.  If you want to prevent pregnancy,  talk to your health care provider about birth control (contraception). OSTEOPOROSIS AND MENOPAUSE   Osteoporosis is a disease in which the bones lose minerals and strength with aging. This can result in serious bone fractures. Your risk for osteoporosis can be identified using a bone density scan.  If you are 30 years of age or older, or if you are at risk for osteoporosis and fractures, ask your health care provider if you should be screened.  Ask your health care provider whether you should take a calcium or vitamin D supplement to lower your risk for osteoporosis.  Menopause may have certain physical symptoms and risks.  Hormone replacement therapy may reduce some of these symptoms and risks. Talk to your health care provider about whether hormone replacement therapy is right for you.  HOME CARE INSTRUCTIONS   Schedule regular health, dental, and eye exams.  Stay current with your immunizations.   Do not use any tobacco products including cigarettes, chewing tobacco, or electronic cigarettes.  If you are pregnant, do not drink alcohol.  If you are breastfeeding, limit how much and how often you drink alcohol.  Limit alcohol intake to no more than 1 drink per day for nonpregnant women. One drink equals 12 ounces of beer, 5 ounces of wine, or 1 ounces of hard liquor.  Do not use street drugs.  Do not share needles.  Ask your health care provider for help if you need support or information about quitting drugs.  Tell your health care provider if you often feel depressed.  Tell your health care provider if you have ever been abused or do not feel safe at home.   This information is not intended to replace advice given to you by your health care provider. Make sure you discuss any questions you have with your health care provider.   Document Released: 05/13/2011 Document Revised: 11/18/2014 Document Reviewed: 09/29/2013 Elsevier Interactive Patient Education [employer].

## 2015-11-29 NOTE — Progress Notes (Addendum)
Subjective:   Pam Gamble is a 70 y.o. female who presents for Medicare Annual (Subsequent) preventive examination.  Review of Systems:  HRA assessment completed during visit; Pole, Jorge The Patient was informed that this wellness visit is to identify risk and educate on how to reduce risk for increase disease through lifestyle changes.   ROS deferred to CPE exam with physician Advair is making the difference;  Discussed possible LDCT and pulmonary rehab per pulmonologist; has apt in 2 months;  Could not afford Dulera and was started on Advair; and SA inhalers Medical issues COPD and seen by Pulmonary on 1/12;  Discussed avoiding cold weather; or wear face mask ; washing hands frequently;   Shoulder pain; been taking Tylenol and aleve; Shoulder is sore today; but better  DM 2/ refused labs 04/2015; did not come back for Sept apt with [facility] Outreach made and stated that she would need fup apt for meds; [place] for now; Will encourage blood work to be drawn since last A1c was elevated  Labs Sept 2015 A1c 9.3 (WBC was elevated)  Lipids chol 217; Trig 293; HDL 15; LDL 58;  Has been diabetic 2007;   BMI: 26.9 Diet; frustrated because she can't get BSs down because she is sick; COPD and sob has increased in the last year; FBS 150 in am which she considers normal;  Breakfast eggs, bacon and sausage; pancake; oatmeal;  Lunch: sandwich;  Supper; chicken and pork chops; not much beef; steak on occasion;  Fresh fish; eats more vegetables and recently collards and other greens which is new for her.   Loves to hunt and fish; Does dress the meat; venison is very low fat   Exercise; Can't walk very far;   Checks BS every am; comfortable with 150; Levemir; has a sliding scale when up Doesn't know when they are high; sometimes gets sleepy;  She states she avoids getting low; 90 is low for her   SA[place]; Safety reviewed for the home; clear paths  through the home, eliminating clutter, railing as needed; bathroom safety; both bathrooms has garden tub with shower; community safety; smoke detectors and firearms safety reviewed Driving accidents and seatbelt No accidents; wears seatbelt Sun protection -  Stressors juts wants to breath better   Medication review/ Advair and feels this is really helping her; Having a much better day today; Coached regarding BS and the patient focuses on sob and feels once her breathing is better, she will manage her DM Agreed to fup with [facility] to check A1c and other labs. Discussed the benefit of pul rehab;  Plan to go to Diabetes center after her next visit with PUlmonary BMI in normal range for her age.   Fall assessment ; one fall; bringing boat in at bank; pond; keeps cell phone with her/ no issues with gait noted   Mobilization and Functional losses in the last year.can't lift as much;  Sold her saws;  Has stopped using the treadmill in the last 2 years;    Sleep patterns; sleeps in recliner for breathing; on average sleeps 5 to 6 hours; sometimes she will sleep during the day  Urinary or fecal incontinence reviewed/ hx kidney stones Needs apt with urologist;   Counseling: Hep C; will defer until next blood draw  Foot exam; patient checks feet;  Ua Microabumin/ defer that until next blood draw Colonoscopy; ;has one over 10 years; will discuss colonsocopy EKG: 05/2012 Hearing: 2000hz  both ears  Dexa:  Mammagram: TBS 02/2008 Ophthalmology exam; DM eye exam 11/15/2015  March will have cataract surgery  A1c to be drawn;  Immunizations Due: none  Current Care Team reviewed and updated  Cardiac Risk Factors include: advanced age (>33men, >12 women);diabetes mellitus;dyslipidemia;hypertensionDepression; Dad died this month  Has been in treatment;  Feel ok now; has support; and mental health advocate if she needs it     Objective:     Vitals: BP 120/80 mmHg  Pulse 80  Ht 5\' 2"  (1.575  m)  Wt 152 lb 4 oz (69.06 kg)  BMI 27.84 kg/m2  SpO2 94%  Tobacco History  Smoking status  . Former Smoker -- 2.00 packs/day for 49 years  . Types: Cigarettes  . Quit date: 11/11/2005  Smokeless tobacco  . Never Used     Counseling given: Not Answered   Past Medical History  Diagnosis Date  . Calcium deposits in tendon and bursa   . Esophageal reflux   . Personal history of malignant neoplasm of other parts of uterus   . Lateral epicondylitis  of elbow   . Personal history of other diseases of digestive system   . Barrett's esophagus   . Chronic airway obstruction, not elsewhere classified   . Depression   . Shortness of breath     upon exertion  . Diabetes mellitus 2008ish    type two  . Chronic kidney disease     kidney stone  . Idiopathic thrombocythemia    Past Surgical History  Procedure Laterality Date  . Tonsillectomy and adenoidectomy    . Abdominal hysterectomy    . Splenectomy  1999   Family History  Problem Relation Age of Onset  . Liver cancer Sister   . Cancer Sister     pancreas  . Stroke Other   . Heart disease Mother     CHF  . Heart disease Father     MI   History  Sexual Activity  . Sexual Activity: Not Currently    Outpatient Encounter Prescriptions as of 11/29/2015  Medication Sig  . ACCU-CHEK FASTCLIX LANCETS MISC 1 Units by Does not apply route 2 (two) times daily.  Marland Kitchen albuterol (PROVENTIL HFA;VENTOLIN HFA) 108 (90 BASE) MCG/ACT inhaler Inhale 2 puffs into the lungs every 6 (six) hours as needed for wheezing.  Marland Kitchen azelastine (ASTELIN) 137 MCG/SPRAY nasal spray Place 2 sprays into the nose 2 (two) times daily. Use in each nostril as directed  . B Complex-C-Folic Acid (STRESS B COMPLEX PO) Take 1 capsule by mouth daily.  . Cetirizine HCl (ZYRTEC PO) Take 1 tablet by mouth daily.  . cholecalciferol (VITAMIN D) 1000 UNITS tablet Take 5,000 Units by mouth daily.  Marland Kitchen conjugated estrogens (PREMARIN) vaginal cream Use 1 filled applicator qhs x  1 week, then once weekly  . fluticasone (FLONASE) 50 MCG/ACT nasal spray Place 2 sprays into the nose daily as needed for rhinitis.  . Fluticasone-Salmeterol (ADVAIR DISKUS) 500-50 MCG/DOSE AEPB Inhale 1 puff into the lungs 2 (two) times daily.  . Fluticasone-Salmeterol (ADVAIR DISKUS) 500-50 MCG/DOSE AEPB Inhale 1 puff into the lungs 2 (two) times daily.  . furosemide (LASIX) 20 MG tablet Take 1-2 tablets (20-40 mg total) by mouth daily as needed for fluid or edema.  Marland Kitchen GINKGO BILOBA COMPLEX PO Take 1 capsule by mouth daily.  Marland Kitchen glucose blood (BAYER CONTOUR NEXT TEST) test strip 1 each by Other route daily as needed for other. Use as instructed  . Insulin Detemir (LEVEMIR) 100 UNIT/ML  Pen Sig: Use 14 units daily for glucose 100-200  Use 16 units daily for glucose 201-250  Use 18 units daily for glucose 251-300  Use 20 units daily for glucose >351  Use 4 units daily for glucose <100  . ipratropium-albuterol (DUONEB) 0.5-2.5 (3) MG/3ML SOLN Take 3 mLs by nebulization every 4 (four) hours as needed.  . metFORMIN (GLUCOPHAGE) 1000 MG tablet TAKE ONE TABLET BY MOUTH TWICE DAILY WITH MEALS  . omeprazole (PRILOSEC) 40 MG capsule Take 1 capsule (40 mg total) by mouth daily.  . traMADol (ULTRAM) 50 MG tablet Take 1-2 tablets (50-100 mg total) by mouth 2 (two) times daily as needed.   No facility-administered encounter medications on file as of 11/29/2015.    Activities of Daily Living In your present state of health, do you have any difficulty performing the following activities: 11/29/2015  Hearing? N  Vision? N  Difficulty concentrating or making decisions? N  Walking or climbing stairs? Y  Dressing or bathing? N  Doing errands, shopping? N  Preparing Food and eating ? N  Using the Toilet? N  In the past six months, have you accidently leaked urine? N  Do you have problems with loss of bowel control? N  Managing your Medications? N  Managing your Finances? N  Housekeeping or managing your  Housekeeping? N    Patient Care Team: Cassandria Anger, MD as PCP - General Lowella Bandy, MD as Attending Physician (Urology) Renato Shin, MD as Attending Physician (Internal Medicine)    Assessment:     Assessment   Patient presents for yearly preventative medicine examination. Medicare questionnaire screening were completed, i.e. Functional; fall risk; depression, memory loss and hearing were all unremarkable   All immunizations and health maintenance protocols were reviewed with the patient.   Education provided for laboratory screens;  Hep c ordered as well as Ur Albumin/  Made apt to fup with Dr. Alain Marion; agreed to have labs done prior to the visit (already in [place]) as well as dexa scan.   Medication reconciliation, past medical history, social history, problem list and allergies were reviewed in detail with the patient The patient stated no med changes; discussed new meds on the market to assist with glycemic control.   Goals were established with regard to breathing and overall health. Verbalizes interest in Pulmonary rehab.  End of life planning was discussed and states she does have a Living Will    Exercise Activities and Dietary recommendations    Goals    . patient     Stay healthy enough to go hunting and fishing;  Staying out of cold weather or appropriate garments; To wear mask; can try kerchief;   Keep doctors apt      Fall Risk Fall Risk  11/29/2015  Falls in the past year? Yes  Number falls in past yr: 1  Injury with Fall? No  Follow up Education provided   Depression Screen PHQ 2/9 Scores 11/29/2015 11/29/2015  PHQ - 2 Score 0 0     Cognitive Testing No flowsheet data found.   Ad8 Score 0   Immunization History  Administered Date(s) Administered  . Influenza Split 01/05/2013  . Influenza Whole 08/15/2008, 07/06/2010  . Influenza,inj,Quad PF,36+ Mos 08/06/2013, 07/27/2014  . Influenza-Unspecified 08/13/2015  . Pneumococcal  Conjugate-13 08/06/2013  . Pneumococcal Polysaccharide-23 09/14/2004, 06/14/2009  . Pneumococcal-Unspecified 08/13/2015  . Tetanus 01/28/2013  . Zoster 07/27/2014   Screening Tests Health Maintenance  Topic Date Due  . Hepatitis C Screening  05-02-1946  . FOOT EXAM  05/15/1956  . URINE MICROALBUMIN  05/15/1956  . COLONOSCOPY  05/15/1996  . MAMMOGRAM  02/23/2010  . DEXA SCAN  05/16/2011  . HEMOGLOBIN A1C  01/10/2015  . INFLUENZA VACCINE  06/11/2016  . OPHTHALMOLOGY EXAM  11/14/2016  . TETANUS/TDAP  01/29/2023  . ZOSTAVAX  Completed  . PNA vac Low Risk Adult  Completed      Plan:   Will fup with pulmonology in a couple of months/ hopes to start Pulmonary rehab as noted in md note. Will fup with [facility] and will have overdue labs prior to fup visit. Feels if her breathing was better; she can get her BS back in control. Discussed sick day plan;  To note; Trig were elevated; but the patient did state she was trying to keep her diet clean;   Will have hep c when blood drawn pre visit (Labs ordered in Sept 2016 and the patient did not fup)  Ua microablumin as well   Will see [facility] to discuss colonosocpy in 2017     Diabetes and Nurtrition management of [facility] [email or web address] Would be willing to fup in a couple of months.  Deaf & Hard of Hearing Division Services / due to minor hearing loss No reviews  [facility]  [address]  [phone] can assist with hearing aid   Will schedule Bone density when she gets blood work drawn  Mammogram to be scheduled/ declines mamogram at present  Will consider going back to the gym; try the silver sneaker program Discussed working at her own pace.  ----  During the course of the visit the patient was educated and counseled about the following appropriate screening and preventive services:   Vaccines to include Pneumoccal,  Influenza, Hepatitis B, Td, Zostavax, HCV/ up to date  Electrocardiogram / 06/08/2012  Cardiovascular Disease/ deferred to fup   Colorectal cancer screening/ agreed to schedule in 2017  Bone density screening/ scheduled before leaving today  Diabetes screening/ to be done prior to fup with Dr. Alain Marion also scheduled today  Glaucoma screening/ Cataract surgery due in March of this year  Mammography/declines at present Nutrition counseling / Discussed States she does check her BS's every day; feeling better now and agreed to have A1c and other labs completed   Patient Instructions (the written plan) was given to the patient.   Wynetta Fines, RN  11/29/2015   Medical screening examination/treatment/procedure(s) were performed by non-physician practitioner and as supervising physician I was immediately available for consultation/collaboration. I agree with above. Walker Kehr, MD

## 2015-12-06 ENCOUNTER — Telehealth: Payer: Self-pay | Admitting: Internal Medicine

## 2015-12-06 NOTE — Telephone Encounter (Signed)
Pt is requesting cheaper alternative for Levemir flex pen. Please advise.

## 2015-12-07 NOTE — Telephone Encounter (Signed)
Pt needs to check w/her Insurance what they cover better Thx

## 2015-12-08 NOTE — Telephone Encounter (Signed)
Left mess for patient to call back.  

## 2015-12-08 NOTE — Telephone Encounter (Signed)
Patient called back.  I gave her MD response.  She is going to check with insurance and call back.

## 2015-12-20 ENCOUNTER — Other Ambulatory Visit (INDEPENDENT_AMBULATORY_CARE_PROVIDER_SITE_OTHER): Payer: PPO

## 2015-12-20 ENCOUNTER — Ambulatory Visit (INDEPENDENT_AMBULATORY_CARE_PROVIDER_SITE_OTHER)
Admission: RE | Admit: 2015-12-20 | Discharge: 2015-12-20 | Disposition: A | Discharge status: Home / Self Care | Payer: PPO | Source: Ambulatory Visit | Attending: Internal Medicine | Admitting: Internal Medicine

## 2015-12-20 DIAGNOSIS — Z1159 Encounter for screening for other viral diseases: Secondary | ICD-10-CM

## 2015-12-20 DIAGNOSIS — E1165 Type 2 diabetes mellitus with hyperglycemia: Secondary | ICD-10-CM | POA: Diagnosis not present

## 2015-12-20 DIAGNOSIS — Z780 Asymptomatic menopausal state: Secondary | ICD-10-CM

## 2015-12-20 LAB — MICROALBUMIN / CREATININE URINE RATIO
Creatinine,U: 140.7 mg/dL
MICROALB UR: 10.6 mg/dL — AB (ref 0.0–1.9)
Microalb Creat Ratio: 7.5 mg/g (ref 0.0–30.0)

## 2015-12-21 LAB — HEPATITIS C ANTIBODY: HCV Ab: NEGATIVE

## 2015-12-22 ENCOUNTER — Other Ambulatory Visit: Payer: Self-pay

## 2015-12-22 MED ORDER — IPRATROPIUM-ALBUTEROL 0.5-2.5 (3) MG/3ML IN SOLN: 3.0000 mL | RESPIRATORY_TRACT | Status: DC | PRN

## 2015-12-27 ENCOUNTER — Encounter: Payer: Self-pay | Admitting: Internal Medicine

## 2015-12-27 ENCOUNTER — Ambulatory Visit: Payer: PPO | Admitting: *Deleted

## 2015-12-27 ENCOUNTER — Ambulatory Visit (INDEPENDENT_AMBULATORY_CARE_PROVIDER_SITE_OTHER): Payer: PPO | Admitting: Internal Medicine

## 2015-12-27 VITALS — BP 120/72 | HR 79 | Wt 157.0 lb

## 2015-12-27 DIAGNOSIS — K219 Gastro-esophageal reflux disease without esophagitis: Secondary | ICD-10-CM | POA: Diagnosis not present

## 2015-12-27 DIAGNOSIS — Z794 Long term (current) use of insulin: Secondary | ICD-10-CM

## 2015-12-27 DIAGNOSIS — IMO0001 Reserved for inherently not codable concepts without codable children: Secondary | ICD-10-CM

## 2015-12-27 DIAGNOSIS — E1165 Type 2 diabetes mellitus with hyperglycemia: Secondary | ICD-10-CM | POA: Diagnosis not present

## 2015-12-27 DIAGNOSIS — J441 Chronic obstructive pulmonary disease with (acute) exacerbation: Secondary | ICD-10-CM

## 2015-12-27 MED ORDER — INSULIN DETEMIR 100 UNIT/ML FLEXPEN: PEN_INJECTOR | SUBCUTANEOUS | Status: DC

## 2015-12-27 NOTE — Progress Notes (Signed)
Pre visit review using our clinic review tool, if applicable. No additional management support is needed unless otherwise documented below in the visit note. 

## 2015-12-27 NOTE — Assessment & Plan Note (Signed)
Unable to afford $90 Levemir Will enroll into pt assistance program

## 2015-12-27 NOTE — Assessment & Plan Note (Signed)
Dr Vaughan Browner Chronic, severe Advair Albuterol HHN prn Steroids - frequent use

## 2015-12-27 NOTE — Progress Notes (Signed)
Subjective:  Patient ID: Pam Gamble, female    DOB: 04-13-1946  Age: 70 y.o. MRN: [id number]  CC: No chief complaint on file.   HPI Pam Gamble presents for COPD, osteopenia, DM f/u. Insulin is too $$$$  Outpatient Prescriptions Prior to Visit  Medication Sig Dispense Refill  . ACCU-CHEK FASTCLIX LANCETS MISC 1 Units by Does not apply route 2 (two) times daily. 100 each 11  . B Complex-C-Folic Acid (STRESS B COMPLEX PO) Take 1 capsule by mouth daily.    . Cetirizine HCl (ZYRTEC PO) Take 1 tablet by mouth daily.    . cholecalciferol (VITAMIN D) 1000 UNITS tablet Take 5,000 Units by mouth daily.    . Fluticasone-Salmeterol (ADVAIR DISKUS) 500-50 MCG/DOSE AEPB Inhale 1 puff into the lungs 2 (two) times daily. 60 each 6  . GINKGO BILOBA COMPLEX PO Take 1 capsule by mouth daily.    Marland Kitchen glucose blood (BAYER CONTOUR NEXT TEST) test strip 1 each by Other route daily as needed for other. Use as instructed 100 each 5  . metFORMIN (GLUCOPHAGE) 1000 MG tablet TAKE ONE TABLET BY MOUTH TWICE DAILY WITH MEALS 180 tablet 3  . omeprazole (PRILOSEC) 40 MG capsule Take 1 capsule (40 mg total) by mouth daily. 90 capsule 3  . Insulin Detemir (LEVEMIR) 100 UNIT/ML Pen Sig: Use 14 units daily for glucose 100-200  Use 16 units daily for glucose 201-250  Use 18 units daily for glucose 251-300  Use 20 units daily for glucose >351  Use 4 units daily for glucose <100 15 mL 11  . ipratropium-albuterol (DUONEB) 0.5-2.5 (3) MG/3ML SOLN Take 3 mLs by nebulization every 4 (four) hours as needed. 9 mL 5  . albuterol (PROVENTIL HFA;VENTOLIN HFA) 108 (90 BASE) MCG/ACT inhaler Inhale 2 puffs into the lungs every 6 (six) hours as needed for wheezing. (Patient not taking: Reported on 12/27/2015) 1 Inhaler 5  . azelastine (ASTELIN) 137 MCG/SPRAY nasal spray Place 2 sprays into the nose 2 (two) times daily. Use in each nostril as directed (Patient not taking: Reported on 12/27/2015) 30 mL 3  . conjugated estrogens (PREMARIN)  vaginal cream Use 1 filled applicator qhs x 1 week, then once weekly (Patient not taking: Reported on 12/27/2015) 42.5 g 12  . fluticasone (FLONASE) 50 MCG/ACT nasal spray Place 2 sprays into the nose daily as needed for rhinitis. Reported on 12/27/2015    . furosemide (LASIX) 20 MG tablet Take 1-2 tablets (20-40 mg total) by mouth daily as needed for fluid or edema. (Patient not taking: Reported on 12/27/2015) 60 tablet 3  . traMADol (ULTRAM) 50 MG tablet Take 1-2 tablets (50-100 mg total) by mouth 2 (two) times daily as needed. (Patient not taking: Reported on 12/27/2015) 40 tablet 0  . Fluticasone-Salmeterol (ADVAIR DISKUS) 500-50 MCG/DOSE AEPB Inhale 1 puff into the lungs 2 (two) times daily. (Patient not taking: Reported on 12/27/2015) 1 each 0   No facility-administered medications prior to visit.    ROS Review of Systems  Constitutional: Negative for chills, activity change, appetite change, fatigue and unexpected weight change.  HENT: Negative for congestion, mouth sores and sinus pressure.   Eyes: Negative for visual disturbance.  Respiratory: Positive for shortness of breath and wheezing. Negative for cough and chest tightness.   Gastrointestinal: Negative for nausea and abdominal pain.  Genitourinary: Negative for frequency, difficulty urinating and vaginal pain.  Musculoskeletal: Negative for back pain and gait problem.  Skin: Negative for pallor and rash.  Neurological: Negative for  dizziness, tremors, weakness, numbness and headaches.  Psychiatric/Behavioral: Negative for confusion and sleep disturbance. The patient is not nervous/anxious.     Objective:  BP 120/72 mmHg  Pulse 79  Wt 157 lb (71.215 kg)  SpO2 95%  BP Readings from Last 3 Encounters:  12/27/15 120/72  11/29/15 120/80  11/23/15 140/76    Wt Readings from Last 3 Encounters:  12/27/15 157 lb (71.215 kg)  11/29/15 152 lb 4 oz (69.06 kg)  11/23/15 156 lb 8 oz (70.988 kg)    Physical Exam  Constitutional:  She appears well-developed. No distress.  HENT:  Head: Normocephalic.  Right Ear: External ear normal.  Left Ear: External ear normal.  Nose: Nose normal.  Mouth/Throat: Oropharynx is clear and moist.  Eyes: Conjunctivae are normal. Pupils are equal, round, and reactive to light. Right eye exhibits no discharge. Left eye exhibits no discharge.  Neck: Normal range of motion. Neck supple. No JVD present. No tracheal deviation present. No thyromegaly present.  Cardiovascular: Normal rate, regular rhythm and normal heart sounds.   Pulmonary/Chest: No stridor. No respiratory distress. She has no wheezes.  Abdominal: Soft. Bowel sounds are normal. She exhibits no distension and no mass. There is no tenderness. There is no rebound and no guarding.  Musculoskeletal: She exhibits no edema or tenderness.  Lymphadenopathy:    She has no cervical adenopathy.  Neurological: She displays normal reflexes. No cranial nerve deficit. She exhibits normal muscle tone. Coordination normal.  Skin: No rash noted. No erythema.  Psychiatric: She has a normal mood and affect. Her behavior is normal. Judgment and thought content normal.    Lab Results  Component Value Date   WBC 17.4* 07/12/2014   HGB 12.4 07/12/2014   HCT 38.3 07/12/2014   PLT 377.0 07/12/2014   GLUCOSE 191 09/09/2014   CHOL 217* 07/12/2014   TRIG 293.0* 07/12/2014   HDL 15.90* 07/12/2014   LDLDIRECT 160.1 07/12/2014   LDLCALC * 02/20/2011    136        Total Cholesterol/HDL:CHD Risk Coronary Heart Disease Risk Table                     Men   Women  1/2 Average Risk   3.4   3.3  Average Risk       5.0   4.4  2 X Average Risk   9.6   7.1  3 X Average Risk  23.4   11.0        Use the calculated Patient Ratio above and the CHD Risk Table to determine the patient's CHD Risk.        ATP III CLASSIFICATION (LDL):  <100     mg/dL   Optimal  100-129  mg/dL   Near or Above                    Optimal  130-159  mg/dL   Borderline   160-189  mg/dL   High  >190     mg/dL   Very High   ALT 38* 07/12/2014   AST 45* 07/12/2014   NA 134* 07/12/2014   K 3.6 07/12/2014   CL 95* 07/12/2014   CREATININE 0.9 07/12/2014   BUN 13 07/12/2014   CO2 29 07/12/2014   TSH 0.93 04/27/2013   PSA 0.02* 01/01/2008   HGBA1C 9.3* 07/12/2014   MICROALBUR 10.6* 12/20/2015    Dexascan  12/24/2015  Date of study: 12/20/15 Exam: DUAL X-RAY ABSORPTIOMETRY (DXA) FOR BONE  MINERAL DENSITY (BMD) Instrument: [employer] Provider: PCP Indication: follow up for low BMD Comparison: none (please note that it is not possible to compare data from different instruments) Clinical data: Pt is a postmenopausal 70 y.o. female with no previous h/o fracture. On calcium and vitamin D. Results:  Lumbar spine (L1-L4) Femoral neck (FN) 33% distal radius T-score -1.1 RFN:-1.6 LFN:-1.4 n/a Change in BMD from previous DXA test (%) n/a n/a n/a (*) statistically significant Assessment: the BMD is low according to the [facility] classification for osteoporosis (see below). Fracture risk: moderate FRAX score: 10 year major osteoporotic risk: 16.2%. 10 year hip fracture risk: 3.0%. These are under the thresholds for treatment of 20% and 3%, respectively. Comments: the technical quality of the study is good. Evaluation for secondary causes should be considered if clinically indicated. Recommend optimizing calcium (1200 mg/day) and vitamin D (800 IU/day) intake. Followup: Repeat BMD is appropriate after 2 years or after 1-2 years if starting treatment. WHO criteria for diagnosis of osteoporosis in postmenopausal women and in men 83 y/o or older: - normal: T-score -1.0 to + 1.0 - osteopenia/low bone density: T-score between -2.5 and -1.0 - osteoporosis: T-score below -2.5 - severe osteoporosis: T-score below -2.5 with history of fragility fracture Note: although not part of the WHO classification, the presence of a fragility fracture, regardless of the T-score, should be  considered diagnostic of osteoporosis, provided other causes for the fracture have been excluded. Treatment: The National Osteoporosis Foundation recommends that treatment be considered in postmenopausal women and men age 73 or older with: 1. Hip or vertebral (clinical or morphometric) fracture 2. T-score of - 2.5 or lower at the spine or hip 3. 10-year fracture probability by FRAX of at least 20% for a major osteoporotic fracture and 3% for a hip fracture Loura Pardon MD    Assessment & Plan:   Diagnoses and all orders for this visit:  COPD exacerbation ([place])  Uncontrolled type 2 diabetes mellitus without complication, with long-term current use of insulin ([place])  Other orders -     Insulin Detemir (LEVEMIR) 100 UNIT/ML Pen; Sig: Use 14 units daily for glucose 100-200  Use 16 units daily for glucose 201-250  Use 18 units daily for glucose 251-300  Use 20 units daily for glucose >351  Use 4 units daily for glucose <100  I have discontinued Ms. Lavin's ipratropium-albuterol. I am also having her maintain her ACCU-CHEK FASTCLIX LANCETS, azelastine, cholecalciferol, B Complex-C-Folic Acid (STRESS B COMPLEX PO), GINKGO BILOBA COMPLEX PO, Cetirizine HCl (ZYRTEC PO), fluticasone, traMADol, albuterol, furosemide, omeprazole, glucose blood, conjugated estrogens, metFORMIN, Fluticasone-Salmeterol, and Insulin Detemir.  Meds ordered this encounter  Medications  . Insulin Detemir (LEVEMIR) 100 UNIT/ML Pen    Sig: Sig: Use 14 units daily for glucose 100-200  Use 16 units daily for glucose 201-250  Use 18 units daily for glucose 251-300  Use 20 units daily for glucose >351  Use 4 units daily for glucose <100    Dispense:  45 mL    Refill:  3     Follow-up: Return in about 4 weeks (around 01/24/2016) for a follow-up visit.  Walker Kehr, MD

## 2015-12-27 NOTE — Assessment & Plan Note (Signed)
Omeprazole qd 

## 2015-12-29 ENCOUNTER — Encounter: Payer: Self-pay | Admitting: Internal Medicine

## 2015-12-30 ENCOUNTER — Other Ambulatory Visit: Payer: Self-pay | Admitting: Internal Medicine

## 2015-12-30 MED ORDER — OMEPRAZOLE 40 MG PO CPDR: 40.0000 mg | DELAYED_RELEASE_CAPSULE | Freq: Every day | ORAL | Status: DC

## 2016-01-02 ENCOUNTER — Other Ambulatory Visit: Payer: Self-pay | Admitting: Internal Medicine

## 2016-01-02 MED ORDER — OMEPRAZOLE 40 MG PO CPDR: 40.0000 mg | DELAYED_RELEASE_CAPSULE | Freq: Every day | ORAL | Status: DC

## 2016-01-22 ENCOUNTER — Ambulatory Visit (INDEPENDENT_AMBULATORY_CARE_PROVIDER_SITE_OTHER): Payer: PPO | Admitting: Internal Medicine

## 2016-01-22 ENCOUNTER — Encounter: Payer: Self-pay | Admitting: Internal Medicine

## 2016-01-22 ENCOUNTER — Other Ambulatory Visit (INDEPENDENT_AMBULATORY_CARE_PROVIDER_SITE_OTHER): Payer: PPO

## 2016-01-22 VITALS — BP 130/80 | HR 85 | Wt 157.0 lb

## 2016-01-22 DIAGNOSIS — J449 Chronic obstructive pulmonary disease, unspecified: Secondary | ICD-10-CM | POA: Diagnosis not present

## 2016-01-22 DIAGNOSIS — J441 Chronic obstructive pulmonary disease with (acute) exacerbation: Secondary | ICD-10-CM

## 2016-01-22 DIAGNOSIS — Z9081 Acquired absence of spleen: Secondary | ICD-10-CM

## 2016-01-22 DIAGNOSIS — K2270 Barrett's esophagus without dysplasia: Secondary | ICD-10-CM

## 2016-01-22 DIAGNOSIS — IMO0001 Reserved for inherently not codable concepts without codable children: Secondary | ICD-10-CM

## 2016-01-22 DIAGNOSIS — E1165 Type 2 diabetes mellitus with hyperglycemia: Secondary | ICD-10-CM

## 2016-01-22 DIAGNOSIS — Z794 Long term (current) use of insulin: Secondary | ICD-10-CM

## 2016-01-22 LAB — LIPID PANEL
Cholesterol: 234 mg/dL — ABNORMAL HIGH (ref 0–200)
HDL: 55.9 mg/dL (ref >39.00)
LDL CALC: 153 mg/dL — AB (ref 0–99)
NONHDL: 177.81
Total CHOL/HDL Ratio: 4
Triglycerides: 126 mg/dL (ref 0.0–149.0)
VLDL: 25.2 mg/dL (ref 0.0–40.0)

## 2016-01-22 LAB — BASIC METABOLIC PANEL
BUN: 15 mg/dL (ref 6–23)
CALCIUM: 9.6 mg/dL (ref 8.4–10.5)
CHLORIDE: 99 meq/L (ref 96–112)
CO2: 29 mEq/L (ref 19–32)
CREATININE: 0.68 mg/dL (ref 0.40–1.20)
GFR: 91 mL/min (ref >60.00)
Glucose, Bld: 273 mg/dL — ABNORMAL HIGH (ref 70–99)
Potassium: 4.3 mEq/L (ref 3.5–5.1)
Sodium: 137 mEq/L (ref 135–145)

## 2016-01-22 LAB — HEMOGLOBIN A1C: HEMOGLOBIN A1C: 11.9 % — AB (ref 4.6–6.5)

## 2016-01-22 LAB — TSH: TSH: 1.36 u[IU]/mL (ref 0.35–4.50)

## 2016-01-22 NOTE — Assessment & Plan Note (Signed)
Papers for [facility] PA program filled out

## 2016-01-22 NOTE — Assessment & Plan Note (Signed)
Levaquin Rx Vaccines

## 2016-01-22 NOTE — Progress Notes (Signed)
Pre visit review using our clinic review tool, if applicable. No additional management support is needed unless otherwise documented below in the visit note. 

## 2016-01-22 NOTE — Progress Notes (Signed)
Subjective:  Patient ID: Pam Gamble, female    DOB: August 14, 1946  Age: 70 y.o. MRN: [id number]  CC: No chief complaint on file.   HPI Pam Gamble presents for COPD, DM, OA f/u  Outpatient Prescriptions Prior to Visit  Medication Sig Dispense Refill  . ACCU-CHEK FASTCLIX LANCETS MISC 1 Units by Does not apply route 2 (two) times daily. 100 each 11  . albuterol (PROVENTIL HFA;VENTOLIN HFA) 108 (90 BASE) MCG/ACT inhaler Inhale 2 puffs into the lungs every 6 (six) hours as needed for wheezing. 1 Inhaler 5  . azelastine (ASTELIN) 137 MCG/SPRAY nasal spray Place 2 sprays into the nose 2 (two) times daily. Use in each nostril as directed 30 mL 3  . B Complex-C-Folic Acid (STRESS B COMPLEX PO) Take 1 capsule by mouth daily.    . Cetirizine HCl (ZYRTEC PO) Take 1 tablet by mouth daily.    . cholecalciferol (VITAMIN D) 1000 UNITS tablet Take 5,000 Units by mouth daily.    Marland Kitchen conjugated estrogens (PREMARIN) vaginal cream Use 1 filled applicator qhs x 1 week, then once weekly 42.5 g 12  . fluticasone (FLONASE) 50 MCG/ACT nasal spray Place 2 sprays into the nose daily as needed for rhinitis. Reported on 12/27/2015    . Fluticasone-Salmeterol (ADVAIR DISKUS) 500-50 MCG/DOSE AEPB Inhale 1 puff into the lungs 2 (two) times daily. 60 each 6  . furosemide (LASIX) 20 MG tablet Take 1-2 tablets (20-40 mg total) by mouth daily as needed for fluid or edema. 60 tablet 3  . GINKGO BILOBA COMPLEX PO Take 1 capsule by mouth daily.    Marland Kitchen glucose blood (BAYER CONTOUR NEXT TEST) test strip 1 each by Other route daily as needed for other. Use as instructed 100 each 5  . Insulin Detemir (LEVEMIR) 100 UNIT/ML Pen Sig: Use 14 units daily for glucose 100-200  Use 16 units daily for glucose 201-250  Use 18 units daily for glucose 251-300  Use 20 units daily for glucose >351  Use 4 units daily for glucose <100 45 mL 3  . metFORMIN (GLUCOPHAGE) 1000 MG tablet TAKE ONE TABLET BY MOUTH TWICE DAILY WITH MEALS 180 tablet 3  .  omeprazole (PRILOSEC) 40 MG capsule Take 1 capsule (40 mg total) by mouth daily. 90 capsule 3  . traMADol (ULTRAM) 50 MG tablet Take 1-2 tablets (50-100 mg total) by mouth 2 (two) times daily as needed. 40 tablet 0   No facility-administered medications prior to visit.    ROS Review of Systems  Constitutional: Negative for chills, activity change, appetite change, fatigue and unexpected weight change.  HENT: Negative for congestion, mouth sores and sinus pressure.   Eyes: Negative for visual disturbance.  Respiratory: Positive for chest tightness, shortness of breath and wheezing. Negative for cough.   Gastrointestinal: Negative for nausea and abdominal pain.  Genitourinary: Negative for frequency, difficulty urinating and vaginal pain.  Musculoskeletal: Negative for back pain and gait problem.  Skin: Negative for pallor and rash.  Neurological: Negative for dizziness, tremors, weakness, numbness and headaches.  Psychiatric/Behavioral: Negative for confusion and sleep disturbance.    Objective:  BP 130/80 mmHg  Pulse 85  Wt 157 lb (71.215 kg)  SpO2 95%  BP Readings from Last 3 Encounters:  01/22/16 130/80  12/27/15 120/72  11/29/15 120/80    Wt Readings from Last 3 Encounters:  01/22/16 157 lb (71.215 kg)  12/27/15 157 lb (71.215 kg)  11/29/15 152 lb 4 oz (69.06 kg)    Physical Exam  Constitutional: She appears well-developed. No distress.  HENT:  Head: Normocephalic.  Right Ear: External ear normal.  Left Ear: External ear normal.  Nose: Nose normal.  Mouth/Throat: Oropharynx is clear and moist.  Eyes: Conjunctivae are normal. Pupils are equal, round, and reactive to light. Right eye exhibits no discharge. Left eye exhibits no discharge.  Neck: Normal range of motion. Neck supple. No JVD present. No tracheal deviation present. No thyromegaly present.  Cardiovascular: Normal rate, regular rhythm and normal heart sounds.   Pulmonary/Chest: No stridor. No respiratory  distress. She has wheezes.  Abdominal: Soft. Bowel sounds are normal. She exhibits no distension and no mass. There is no tenderness. There is no rebound and no guarding.  Musculoskeletal: She exhibits no edema or tenderness.  Lymphadenopathy:    She has no cervical adenopathy.  Neurological: She displays normal reflexes. No cranial nerve deficit. She exhibits normal muscle tone. Coordination normal.  Skin: No rash noted. No erythema.  Psychiatric: She has a normal mood and affect. Her behavior is normal. Judgment and thought content normal.  Obese  Lab Results  Component Value Date   WBC 17.4* 07/12/2014   HGB 12.4 07/12/2014   HCT 38.3 07/12/2014   PLT 377.0 07/12/2014   GLUCOSE 191 09/09/2014   CHOL 217* 07/12/2014   TRIG 293.0* 07/12/2014   HDL 15.90* 07/12/2014   LDLDIRECT 160.1 07/12/2014   LDLCALC * 02/20/2011    136        Total Cholesterol/HDL:CHD Risk Coronary Heart Disease Risk Table                     Men   Women  1/2 Average Risk   3.4   3.3  Average Risk       5.0   4.4  2 X Average Risk   9.6   7.1  3 X Average Risk  23.4   11.0        Use the calculated Patient Ratio above and the CHD Risk Table to determine the patient's CHD Risk.        ATP III CLASSIFICATION (LDL):  <100     mg/dL   Optimal  100-129  mg/dL   Near or Above                    Optimal  130-159  mg/dL   Borderline  160-189  mg/dL   High  >190     mg/dL   Very High   ALT 38* 07/12/2014   AST 45* 07/12/2014   NA 134* 07/12/2014   K 3.6 07/12/2014   CL 95* 07/12/2014   CREATININE 0.9 07/12/2014   BUN 13 07/12/2014   CO2 29 07/12/2014   TSH 0.93 04/27/2013   PSA 0.02* 01/01/2008   HGBA1C 9.3* 07/12/2014   MICROALBUR 10.6* 12/20/2015    Dexascan  12/24/2015  Date of study: 12/20/15 Exam: DUAL X-RAY ABSORPTIOMETRY (DXA) FOR BONE MINERAL DENSITY (BMD) Instrument: [employer] Provider: PCP Indication: follow up for low BMD Comparison: none (please note that it is not  possible to compare data from different instruments) Clinical data: Pt is a postmenopausal 70 y.o. female with no previous h/o fracture. On calcium and vitamin D. Results:  Lumbar spine (L1-L4) Femoral neck (FN) 33% distal radius T-score -1.1 RFN:-1.6 LFN:-1.4 n/a Change in BMD from previous DXA test (%) n/a n/a n/a (*) statistically significant Assessment: the BMD is low according to the [facility] classification for osteoporosis (see below).  Fracture risk: moderate FRAX score: 10 year major osteoporotic risk: 16.2%. 10 year hip fracture risk: 3.0%. These are under the thresholds for treatment of 20% and 3%, respectively. Comments: the technical quality of the study is good. Evaluation for secondary causes should be considered if clinically indicated. Recommend optimizing calcium (1200 mg/day) and vitamin D (800 IU/day) intake. Followup: Repeat BMD is appropriate after 2 years or after 1-2 years if starting treatment. WHO criteria for diagnosis of osteoporosis in postmenopausal women and in men 36 y/o or older: - normal: T-score -1.0 to + 1.0 - osteopenia/low bone density: T-score between -2.5 and -1.0 - osteoporosis: T-score below -2.5 - severe osteoporosis: T-score below -2.5 with history of fragility fracture Note: although not part of the WHO classification, the presence of a fragility fracture, regardless of the T-score, should be considered diagnostic of osteoporosis, provided other causes for the fracture have been excluded. Treatment: The National Osteoporosis Foundation recommends that treatment be considered in postmenopausal women and men age 71 or older with: 1. Hip or vertebral (clinical or morphometric) fracture 2. T-score of - 2.5 or lower at the spine or hip 3. 10-year fracture probability by FRAX of at least 20% for a major osteoporotic fracture and 3% for a hip fracture Loura Pardon MD    Assessment & Plan:   Diagnoses and all orders for this visit:  Uncontrolled type 2 diabetes mellitus without  complication, with long-term current use of insulin ([facility])  Post-splenectomy  COPD, frequent exacerbations ([place])  Barrett's esophagus without dysplasia -     Ambulatory referral to Gastroenterology  I am having Ms. Dibble maintain her ACCU-CHEK FASTCLIX LANCETS, azelastine, cholecalciferol, B Complex-C-Folic Acid (STRESS B COMPLEX PO), GINKGO BILOBA COMPLEX PO, Cetirizine HCl (ZYRTEC PO), fluticasone, traMADol, albuterol, furosemide, glucose blood, conjugated estrogens, metFORMIN, Fluticasone-Salmeterol, Insulin Detemir, omeprazole, fluconazole, ipratropium-albuterol, and VIGAMOX.  Meds ordered this encounter  Medications  . fluconazole (DIFLUCAN) 150 MG tablet    Sig: as needed.  Marland Kitchen ipratropium-albuterol (DUONEB) 0.5-2.5 (3) MG/3ML SOLN    Sig: as needed.  Marland Kitchen VIGAMOX 0.5 % ophthalmic solution    Sig: Starting tomorrow     Follow-up: Return in about 4 months (around 05/23/2016) for a follow-up visit.  Walker Kehr, MD

## 2016-01-22 NOTE — Assessment & Plan Note (Addendum)
  Advair Albuterol HHN prn Steroids - frequent use - off now   Pt sleeps in a recliner since 2012

## 2016-01-22 NOTE — Assessment & Plan Note (Addendum)
On Prilosec Pt needs EGD - discussed Pt sleeps in a recliner

## 2016-01-23 ENCOUNTER — Encounter: Payer: Self-pay | Admitting: Internal Medicine

## 2016-01-23 ENCOUNTER — Encounter: Payer: Self-pay | Admitting: Gastroenterology

## 2016-01-23 ENCOUNTER — Other Ambulatory Visit (INDEPENDENT_AMBULATORY_CARE_PROVIDER_SITE_OTHER): Payer: PPO

## 2016-01-23 ENCOUNTER — Encounter: Payer: Self-pay | Admitting: Pulmonary Disease

## 2016-01-23 ENCOUNTER — Ambulatory Visit (INDEPENDENT_AMBULATORY_CARE_PROVIDER_SITE_OTHER): Payer: PPO | Admitting: Pulmonary Disease

## 2016-01-23 ENCOUNTER — Other Ambulatory Visit: Payer: Self-pay | Admitting: *Deleted

## 2016-01-23 ENCOUNTER — Other Ambulatory Visit: Payer: PPO

## 2016-01-23 VITALS — BP 134/82 | HR 102 | Ht 62.0 in | Wt 157.4 lb

## 2016-01-23 DIAGNOSIS — J439 Emphysema, unspecified: Secondary | ICD-10-CM

## 2016-01-23 DIAGNOSIS — K2270 Barrett's esophagus without dysplasia: Secondary | ICD-10-CM

## 2016-01-23 DIAGNOSIS — Z794 Long term (current) use of insulin: Secondary | ICD-10-CM | POA: Diagnosis not present

## 2016-01-23 DIAGNOSIS — J449 Chronic obstructive pulmonary disease, unspecified: Secondary | ICD-10-CM

## 2016-01-23 DIAGNOSIS — Z9081 Acquired absence of spleen: Secondary | ICD-10-CM | POA: Diagnosis not present

## 2016-01-23 DIAGNOSIS — J4489 Other specified chronic obstructive pulmonary disease: Secondary | ICD-10-CM

## 2016-01-23 DIAGNOSIS — E1165 Type 2 diabetes mellitus with hyperglycemia: Secondary | ICD-10-CM | POA: Diagnosis not present

## 2016-01-23 DIAGNOSIS — IMO0001 Reserved for inherently not codable concepts without codable children: Secondary | ICD-10-CM

## 2016-01-23 LAB — URINALYSIS, ROUTINE W REFLEX MICROSCOPIC
BILIRUBIN URINE: NEGATIVE
LEUKOCYTES UA: NEGATIVE
NITRITE: NEGATIVE
PH: 5.5 (ref 5.0–8.0)
Specific Gravity, Urine: >=1.03 — AB (ref 1.000–1.030)
TOTAL PROTEIN, URINE-UPE24: 100 — AB
UROBILINOGEN UA: 0.2 (ref 0.0–1.0)

## 2016-01-23 MED ORDER — GLUCOSE BLOOD VI STRP: 1.0000 | ORAL_STRIP | Freq: Two times a day (BID) | Status: DC

## 2016-01-23 NOTE — Patient Instructions (Addendum)
Continue using the advair as prescribed  Return to clinic in 6 months.

## 2016-01-23 NOTE — Progress Notes (Signed)
Subjective:    Patient ID: Pam Gamble, female    DOB: 28-Feb-1946, 70 y.o.   MRN: [id number]  PROBLEM LIST: Severe COPD Asthmatic bronchitis.   HPI Mrs. Pam Gamble is a 70 year old with severe COPD, asthmatic bronchitis heavy smoking history. She is a former patient of Dr. Joya Gaskins but has not seen him since 2013. She is not currently on any long-term controller inhalers. She was on dulera in the past and reports improvement in symptoms and it. However she did not continue using it as this was not covered by her insurance. She is just using albuterol rescue inhaler and  duonebs as needed.  She reports worsening symptoms over the past 1 year. She was hospitalized for a few days in 2015. In 2016 before Christmas she had to go to the urgent care for acute exacerbation. She was treated with antibiotics and steroids. She reports that her symptoms have improved but not back to baseline yet. She loves going hunting fishing and would like to improve to a stage where she can enjoy these activities again.  Interim history: She was started on advair at last visit and reports 80% improvement in symptoms. No exacerbations or ER visits.   DATA: PFTs 06/27/15 FVC 1.69 (59%) FEV1 0.98 (47%)  F/F 58 TLC 87% DLCO 46%. Severe obstructive lung disease with reduction in diffusion capacity. No bronchodilator response.  CXR 12/21/13 No active cardiopulmonary disease.  Social history: She smoked 2 pack a day for 40 years. She quit in 2007. Takes alcohol occasionally. She is single and lives by herself.  Family History: Father- Heart disease Mother- heart disease Sister- Liver cancer   Past Medical History  Diagnosis Date  . Calcium deposits in tendon and bursa   . Esophageal reflux   . Personal history of malignant neoplasm of other parts of uterus   . Lateral epicondylitis  of elbow   . Personal history of other diseases of digestive system   . Barrett's esophagus   . Chronic airway obstruction, not  elsewhere classified   . Depression   . Shortness of breath     upon exertion  . Diabetes mellitus 2008ish    type two  . Chronic kidney disease     kidney stone  . Idiopathic thrombocythemia     Review of Systems Dyspnea on exertion, cough, sputum production, wheezing. Denies any fevers, chills, hemoptysis. Denies any chest pain, palpitations. Denies any malaise, fatigue, loss of weight, appetite. Denies any nausea, vomiting, diarrhea, constipation. All other review of systems are negative.    Objective:   Physical Exam Blood pressure 134/82, pulse 102, height 5\' 2"  (1.575 m), weight 157 lb 6.4 oz (71.396 kg), SpO2 96 %. Gen: No apparent distress Neuro: No gross focal deficits. Neck: No JVD, lymphadenopathy, thyromegaly. RS: Clear, no wheeze, crackles CVS: S1-S2 heard, no murmurs rubs gallops. Abdomen: Soft, positive bowel sounds. Extremities: No edema.    Assessment & Plan:  Severe COPD, asthmatic bronchitis  Symptoms are improved on advair. She will continue to use her albuterol rescue inhaler and duo nebs as needed. She is a candidate for pulmonary rehabilitation and for CT screening. I discussed arranging this with her. But she cannot afford the copays and has many medical procedures coming up including colonoscopy and cataract surgery, so would like to hold off. I will readdress this issue at next visit.  Plan: - Continue advair 500/50, albuterol rescue inhaler, nebs  Return to clinic in 6 months.  [name] Garfinkel MD [facility] Pager [phone] If no answer or after 3pm call: [phone] 01/23/2016, 9:34 AMPatient ID: Pam Gamble, female   DOB: [birth year], 70 y.o.   MRN: [id number]

## 2016-01-25 DIAGNOSIS — H2511 Age-related nuclear cataract, right eye: Secondary | ICD-10-CM | POA: Diagnosis not present

## 2016-01-25 DIAGNOSIS — H25811 Combined forms of age-related cataract, right eye: Secondary | ICD-10-CM | POA: Diagnosis not present

## 2016-01-25 DIAGNOSIS — H25011 Cortical age-related cataract, right eye: Secondary | ICD-10-CM | POA: Diagnosis not present

## 2016-01-26 ENCOUNTER — Other Ambulatory Visit: Payer: Self-pay | Admitting: *Deleted

## 2016-01-26 MED ORDER — GLUCOSE BLOOD VI STRP: 1.0000 | ORAL_STRIP | Freq: Two times a day (BID) | Status: DC

## 2016-01-26 MED ORDER — ONETOUCH LANCETS MISC: 1.0000 | Freq: Two times a day (BID) | Status: DC

## 2016-01-26 MED ORDER — ONETOUCH ULTRA SYSTEM W/DEVICE KIT: 1.0000 | PACK | Freq: Once | Status: DC

## 2016-03-13 ENCOUNTER — Ambulatory Visit (INDEPENDENT_AMBULATORY_CARE_PROVIDER_SITE_OTHER): Payer: PPO | Admitting: Internal Medicine

## 2016-03-13 ENCOUNTER — Encounter: Payer: Self-pay | Admitting: Internal Medicine

## 2016-03-13 VITALS — BP 116/72 | HR 80 | Ht 62.0 in | Wt 158.5 lb

## 2016-03-13 DIAGNOSIS — Z1211 Encounter for screening for malignant neoplasm of colon: Secondary | ICD-10-CM

## 2016-03-13 DIAGNOSIS — K219 Gastro-esophageal reflux disease without esophagitis: Secondary | ICD-10-CM

## 2016-03-13 DIAGNOSIS — E119 Type 2 diabetes mellitus without complications: Secondary | ICD-10-CM

## 2016-03-13 DIAGNOSIS — K2270 Barrett's esophagus without dysplasia: Secondary | ICD-10-CM | POA: Diagnosis not present

## 2016-03-13 DIAGNOSIS — R194 Change in bowel habit: Secondary | ICD-10-CM

## 2016-03-13 MED ORDER — NA SULFATE-K SULFATE-MG SULF 17.5-3.13-1.6 GM/177ML PO SOLN: 1.0000 | Freq: Once | ORAL | Status: DC

## 2016-03-13 NOTE — Patient Instructions (Signed)

## 2016-03-13 NOTE — Progress Notes (Signed)
HISTORY OF PRESENT ILLNESS:  Pam Gamble is a 70 y.o. female with multiple medical problems including poorly controlled diabetes mellitus and COPD (remote smoker) who is referred by her primary care physician Dr. Alain Marion regarding chronic GERD, Barrett's esophagus, and colon cancer screening. Patient was cared for many years ago by Dr. Velora Heckler. Review of outside endoscopy reports from 2002 and again in 2005 revealed nondysplastic Barrett's esophagus. The patient does have chronic GERD for which she takes omeprazole 40 mg daily. Despite this she does have breakthrough symptoms requiring antacids. She denies dysphagia. Next, she reports change in her bowel habits. Specifically, she describes her stools as smaller and less frequent. However, episodes of urgency with occasional incontinence. No obvious exacerbating or relieving factors. No bleeding. Her diabetic medications include insulin and metformin. She denies weight loss. No family history of colon cancer. Outside radiology and laboratories have been reviewed. Most recent hemoglobin A1c 11.9. She is status post appendectomy, hysterectomy, and splenectomy  REVIEW OF SYSTEMS:  All non-GI ROS negative except for visual change, fatigue, insomnia, urinary leakage  Past Medical History  Diagnosis Date  . Calcium deposits in tendon and bursa   . Esophageal reflux   . Personal history of malignant neoplasm of other parts of uterus   . Lateral epicondylitis  of elbow   . Personal history of other diseases of digestive system   . Barrett's esophagus   . Chronic airway obstruction, not elsewhere classified   . Depression   . Shortness of breath     upon exertion  . Diabetes mellitus 2008ish    type two  . Kidney stones   . Idiopathic thrombocythemia   . COPD (chronic obstructive pulmonary disease) [facility])     Past Surgical History  Procedure Laterality Date  . Tonsillectomy and adenoidectomy    . Abdominal hysterectomy    . Splenectomy  1999   . Lithotripsy      Social History [name]  reports that she quit smoking about 10 years ago. Her smoking use included Cigarettes. She has a 98 pack-year smoking history. She has never used smokeless tobacco. She reports that she drinks alcohol. She reports that she does not use illicit drugs.  family history includes Diabetes in her father, maternal aunt, and sister; Heart disease in her father and mother; Liver cancer in her sister; Pancreatic cancer in her sister; Stroke in her mother.  Allergies  Allergen Reactions  . Amoxicillin-Pot Clavulanate Nausea Only and Other (See Comments)    REACTION: diarrhea "sick to stomach"       PHYSICAL EXAMINATION: Vital signs: BP 116/72 mmHg  Pulse 80  Ht 5\' 2"  (1.575 m)  Wt 158 lb 8 oz (71.895 kg)  BMI 28.98 kg/m2  Constitutional: generally well-appearing, no acute distress Psychiatric: alert and oriented x3, cooperative Eyes: extraocular movements intact, anicteric, conjunctiva pink Mouth: oral pharynx moist, no lesions Neck: suppleWithout thyromegaly Lymph: no lymphadenopathy Cardiovascular: heart regular rate and rhythm, no murmur Lungs: clear to auscultation bilaterally, with rare end expiratory wheeze Abdomen: soft, nontender, nondistended, no obvious ascites, no peritoneal signs, normal bowel sounds, no organomegaly Rectal: Deferred until colonoscopy Extremities: no clubbing cyanosis or lower extremity edema bilaterally Skin: no lesions on visible extremities Neuro: No focal deficits. Cranial nerves intact  ASSESSMENT:  #1. Chronic GERD. Breakthrough symptoms despite PPI #2. History of nondysplastic Barrett's esophagus. Last surveillance exam 2005. Overdue for surveillance #3. Change in bowel habits as described. Nonspecific. Suspect related to metformin #4. Multiple medical problems including  insulin requiring diabetes   PLAN:  #1. Reflux precautions #2. Continue omeprazole 40 mg daily. Take in a.m. 30 minutes  before breakfast #3. An acids as needed for breakthrough. May need increased PPI dosage if breakthrough frequent #4. Schedule upper endoscopy with surveillance biopsies.The nature of the procedure, as well as the risks, benefits, and alternatives were carefully and thoroughly reviewed with the patient. Ample time for discussion and questions allowed. The patient understood, was satisfied, and agreed to proceed. #5. Schedule colonoscopy for colorectal neoplasia screening and to evaluate nonspecific change in bowel habits. Last exam 2002 was negative.The nature of the procedure, as well as the risks, benefits, and alternatives were carefully and thoroughly reviewed with the patient. Ample time for discussion and questions allowed. The patient understood, was satisfied, and agreed to proceed. #6. Advised to hold metformin the day of the procedure and decrease her evening insulin by 3 or 4 units the night prior to the examination.  A copy of this consultation note has been sent to Dr. Alain Marion

## 2016-03-14 ENCOUNTER — Telehealth: Payer: Self-pay | Admitting: Internal Medicine

## 2016-03-15 NOTE — Telephone Encounter (Signed)
Pam Gamble called and told patient I would leave a Suprep sample up front for her to pick up.  She agreed

## 2016-03-15 NOTE — Telephone Encounter (Signed)
Patient calling back regarding this.  °

## 2016-03-15 NOTE — Telephone Encounter (Signed)
Pt is calling back about his medication

## 2016-03-19 ENCOUNTER — Encounter: Payer: Self-pay | Admitting: Internal Medicine

## 2016-03-21 DIAGNOSIS — H25812 Combined forms of age-related cataract, left eye: Secondary | ICD-10-CM | POA: Diagnosis not present

## 2016-03-21 DIAGNOSIS — H25042 Posterior subcapsular polar age-related cataract, left eye: Secondary | ICD-10-CM | POA: Diagnosis not present

## 2016-03-21 DIAGNOSIS — H2512 Age-related nuclear cataract, left eye: Secondary | ICD-10-CM | POA: Diagnosis not present

## 2016-03-21 DIAGNOSIS — H25012 Cortical age-related cataract, left eye: Secondary | ICD-10-CM | POA: Diagnosis not present

## 2016-04-02 ENCOUNTER — Encounter: Payer: PPO | Admitting: Internal Medicine

## 2016-05-03 DIAGNOSIS — Z0101 Encounter for examination of eyes and vision with abnormal findings: Secondary | ICD-10-CM | POA: Diagnosis not present

## 2016-05-16 DIAGNOSIS — H26491 Other secondary cataract, right eye: Secondary | ICD-10-CM | POA: Diagnosis not present

## 2016-05-16 HISTORY — PX: REFRACTIVE SURGERY: SHX103

## 2016-05-24 ENCOUNTER — Ambulatory Visit (INDEPENDENT_AMBULATORY_CARE_PROVIDER_SITE_OTHER): Payer: PPO | Admitting: Internal Medicine

## 2016-05-24 ENCOUNTER — Encounter: Payer: Self-pay | Admitting: Internal Medicine

## 2016-05-24 VITALS — BP 120/60 | HR 83 | Wt 160.0 lb

## 2016-05-24 DIAGNOSIS — F330 Major depressive disorder, recurrent, mild: Secondary | ICD-10-CM | POA: Diagnosis not present

## 2016-05-24 DIAGNOSIS — J441 Chronic obstructive pulmonary disease with (acute) exacerbation: Secondary | ICD-10-CM

## 2016-05-24 DIAGNOSIS — Z794 Long term (current) use of insulin: Secondary | ICD-10-CM

## 2016-05-24 DIAGNOSIS — R600 Localized edema: Secondary | ICD-10-CM | POA: Diagnosis not present

## 2016-05-24 DIAGNOSIS — E1100 Type 2 diabetes mellitus with hyperosmolarity without nonketotic hyperglycemic-hyperosmolar coma (NKHHC): Secondary | ICD-10-CM

## 2016-05-24 DIAGNOSIS — J449 Chronic obstructive pulmonary disease, unspecified: Secondary | ICD-10-CM | POA: Diagnosis not present

## 2016-05-24 MED ORDER — INSULIN DETEMIR 100 UNIT/ML FLEXPEN: PEN_INJECTOR | SUBCUTANEOUS | Status: DC

## 2016-05-24 NOTE — Assessment & Plan Note (Signed)
Resolved

## 2016-05-24 NOTE — Assessment & Plan Note (Signed)
Discussed.

## 2016-05-24 NOTE — Assessment & Plan Note (Addendum)
Continue with current prescription therapy as reflected on the Med list - Levemir. Titrate up - see Rx

## 2016-05-24 NOTE — Progress Notes (Signed)
Pre visit review using our clinic review tool, if applicable. No additional management support is needed unless otherwise documented below in the visit note. 

## 2016-05-24 NOTE — Progress Notes (Signed)
Subjective:  Patient ID: Pam Gamble, female    DOB: 07-31-1946  Age: 70 y.o. MRN: [id number]  CC: No chief complaint on file.   HPI KALEE BROXTON presents for COPD, DM, OA f/u. CBG are >200  Outpatient Prescriptions Prior to Visit  Medication Sig Dispense Refill  . albuterol (PROVENTIL HFA;VENTOLIN HFA) 108 (90 BASE) MCG/ACT inhaler Inhale 2 puffs into the lungs every 6 (six) hours as needed for wheezing. 1 Inhaler 5  . B Complex-C-Folic Acid (STRESS B COMPLEX PO) Take 1 capsule by mouth daily.    . Blood Glucose Monitoring Suppl (ONE TOUCH ULTRA SYSTEM KIT) w/Device KIT 1 kit by Does not apply route once. 1 each 0  . Cetirizine HCl (ZYRTEC PO) Take 1 tablet by mouth daily.    . cholecalciferol (VITAMIN D) 1000 UNITS tablet Take 5,000 Units by mouth daily.    Marland Kitchen conjugated estrogens (PREMARIN) vaginal cream Place 1 Applicatorful vaginally as needed. Reported on 03/13/2016    . fluticasone (FLONASE) 50 MCG/ACT nasal spray Place 2 sprays into the nose daily as needed for rhinitis. Reported on 01/23/2016    . Fluticasone-Salmeterol (ADVAIR DISKUS) 500-50 MCG/DOSE AEPB Inhale 1 puff into the lungs 2 (two) times daily. 60 each 6  . furosemide (LASIX) 20 MG tablet Take 20-40 mg by mouth as needed. Reported on 03/13/2016    . GINKGO BILOBA COMPLEX PO Take 1 capsule by mouth daily.    Marland Kitchen glucose blood test strip 1 each by Other route 2 (two) times daily. DX: E11.9 100 each 12  . Insulin Detemir (LEVEMIR) 100 UNIT/ML Pen Sig: Use 14 units daily for glucose 100-200  Use 16 units daily for glucose 201-250  Use 18 units daily for glucose 251-300  Use 20 units daily for glucose >351  Use 4 units daily for glucose <100 45 mL 3  . ipratropium-albuterol (DUONEB) 0.5-2.5 (3) MG/3ML SOLN as needed.    . metFORMIN (GLUCOPHAGE) 1000 MG tablet TAKE ONE TABLET BY MOUTH TWICE DAILY WITH MEALS 180 tablet 3  . Na Sulfate-K Sulfate-Mg Sulf SOLN Take 1 kit by mouth once. 354 mL 0  . omeprazole (PRILOSEC) 40 MG  capsule Take 1 capsule (40 mg total) by mouth daily. 90 capsule 3  . ONE TOUCH LANCETS MISC 1 each by Does not apply route 2 (two) times daily. 100 each 3  . traMADol (ULTRAM) 50 MG tablet Take 1-2 tablets (50-100 mg total) by mouth 2 (two) times daily as needed. 40 tablet 0  . VIGAMOX 0.5 % ophthalmic solution Reported on 03/13/2016     No facility-administered medications prior to visit.    ROS Review of Systems  Constitutional: Negative for chills, activity change, appetite change, fatigue and unexpected weight change.  HENT: Negative for congestion, mouth sores and sinus pressure.   Eyes: Negative for visual disturbance.  Respiratory: Positive for shortness of breath and wheezing. Negative for cough and chest tightness.   Gastrointestinal: Negative for nausea and abdominal pain.  Genitourinary: Negative for frequency, difficulty urinating and vaginal pain.  Musculoskeletal: Negative for back pain and gait problem.  Skin: Negative for pallor and rash.  Neurological: Negative for dizziness, tremors, weakness, numbness and headaches.  Psychiatric/Behavioral: Negative for suicidal ideas, confusion and sleep disturbance.    Objective:  BP 120/60 mmHg  Pulse 83  Wt 160 lb (72.576 kg)  SpO2 96%  BP Readings from Last 3 Encounters:  05/24/16 120/60  03/13/16 116/72  01/23/16 134/82    Wt Readings from  Last 3 Encounters:  05/24/16 160 lb (72.576 kg)  03/13/16 158 lb 8 oz (71.895 kg)  01/23/16 157 lb 6.4 oz (71.396 kg)    Physical Exam  Constitutional: She appears well-developed. No distress.  HENT:  Head: Normocephalic.  Right Ear: External ear normal.  Left Ear: External ear normal.  Nose: Nose normal.  Mouth/Throat: Oropharynx is clear and moist.  Eyes: Conjunctivae are normal. Pupils are equal, round, and reactive to light. Right eye exhibits no discharge. Left eye exhibits no discharge.  Neck: Normal range of motion. Neck supple. No JVD present. No tracheal deviation  present. No thyromegaly present.  Cardiovascular: Normal rate, regular rhythm and normal heart sounds.   Pulmonary/Chest: No stridor. No respiratory distress. She has wheezes.  Abdominal: Soft. Bowel sounds are normal. She exhibits no distension and no mass. There is no tenderness. There is no rebound and no guarding.  Musculoskeletal: She exhibits no edema or tenderness.  Lymphadenopathy:    She has no cervical adenopathy.  Neurological: She displays normal reflexes. No cranial nerve deficit. She exhibits normal muscle tone. Coordination normal.  Skin: No rash noted. No erythema.  Psychiatric: She has a normal mood and affect. Her behavior is normal. Judgment and thought content normal.    Lab Results  Component Value Date   WBC 17.4* 07/12/2014   HGB 12.4 07/12/2014   HCT 38.3 07/12/2014   PLT 377.0 07/12/2014   GLUCOSE 273* 01/22/2016   CHOL 234* 01/22/2016   TRIG 126.0 01/22/2016   HDL 55.90 01/22/2016   LDLDIRECT 160.1 07/12/2014   LDLCALC 153* 01/22/2016   ALT 38* 07/12/2014   AST 45* 07/12/2014   NA 137 01/22/2016   K 4.3 01/22/2016   CL 99 01/22/2016   CREATININE 0.68 01/22/2016   BUN 15 01/22/2016   CO2 29 01/22/2016   TSH 1.36 01/22/2016   PSA 0.02* 01/01/2008   HGBA1C 11.9* 01/22/2016   MICROALBUR 10.6* 12/20/2015    Dexascan  12/24/2015  Date of study: 12/20/15 Exam: DUAL X-RAY ABSORPTIOMETRY (DXA) FOR BONE MINERAL DENSITY (BMD) Instrument: [employer] [facility] Provider: PCP Indication: follow up for low BMD Comparison: none (please note that it is not possible to compare data from different instruments) Clinical data: Pt is a postmenopausal 70 y.o. female with no previous h/o fracture. On calcium and vitamin D. Results:  Lumbar spine (L1-L4) Femoral neck (FN) 33% distal radius T-score -1.1 RFN:-1.6 LFN:-1.4 n/a Change in BMD from previous DXA test (%) n/a n/a n/a (*) statistically significant Assessment: the BMD is low according to the [facility] classification  for osteoporosis (see below). Fracture risk: moderate FRAX score: 10 year major osteoporotic risk: 16.2%. 10 year hip fracture risk: 3.0%. These are under the thresholds for treatment of 20% and 3%, respectively. Comments: the technical quality of the study is good. Evaluation for secondary causes should be considered if clinically indicated. Recommend optimizing calcium (1200 mg/day) and vitamin D (800 IU/day) intake. Followup: Repeat BMD is appropriate after 2 years or after 1-2 years if starting treatment. WHO criteria for diagnosis of osteoporosis in postmenopausal women and in men 14 y/o or older: - normal: T-score -1.0 to + 1.0 - osteopenia/low bone density: T-score between -2.5 and -1.0 - osteoporosis: T-score below -2.5 - severe osteoporosis: T-score below -2.5 with history of fragility fracture Note: although not part of the WHO classification, the presence of a fragility fracture, regardless of the T-score, should be considered diagnostic of osteoporosis, provided other causes for the fracture have been excluded.  Treatment: The National Osteoporosis Foundation recommends that treatment be considered in postmenopausal women and men age 45 or older with: 1. Hip or vertebral (clinical or morphometric) fracture 2. T-score of - 2.5 or lower at the spine or hip 3. 10-year fracture probability by FRAX of at least 20% for a major osteoporotic fracture and 3% for a hip fracture Loura Pardon MD    Assessment & Plan:   There are no diagnoses linked to this encounter. I am having Ms. Marzella maintain her cholecalciferol, B Complex-C-Folic Acid (STRESS B COMPLEX PO), GINKGO BILOBA COMPLEX PO, Cetirizine HCl (ZYRTEC PO), fluticasone, traMADol, albuterol, metFORMIN, Fluticasone-Salmeterol, Insulin Detemir, omeprazole, ipratropium-albuterol, VIGAMOX, glucose blood, ONE TOUCH ULTRA SYSTEM KIT, ONE TOUCH LANCETS, conjugated estrogens, furosemide, and Na Sulfate-K Sulfate-Mg Sulf.  No orders of the defined types were  placed in this encounter.   Pt declined colonoscopy  Follow-up: No Follow-up on file.  Walker Kehr, MD

## 2016-05-24 NOTE — Assessment & Plan Note (Signed)
Chronic, severe Advair Albuterol HHN prn Steroids - frequent use

## 2016-07-22 ENCOUNTER — Encounter: Payer: Self-pay | Admitting: Pulmonary Disease

## 2016-07-22 ENCOUNTER — Ambulatory Visit (INDEPENDENT_AMBULATORY_CARE_PROVIDER_SITE_OTHER): Payer: PPO | Admitting: Pulmonary Disease

## 2016-07-22 VITALS — BP 122/74 | HR 81 | Ht 62.0 in | Wt 156.8 lb

## 2016-07-22 DIAGNOSIS — J439 Emphysema, unspecified: Secondary | ICD-10-CM

## 2016-07-22 MED ORDER — FLUTICASONE FUROATE-VILANTEROL 200-25 MCG/INH IN AEPB: 1.0000 | INHALATION_SPRAY | Freq: Every day | RESPIRATORY_TRACT | 0 refills | Status: AC

## 2016-07-22 NOTE — Patient Instructions (Addendum)
It is nice to meet you today. We will give you Samples of Breo. Take 1 puff once a day. Remember to rinse your mouth after using. Use this instead of the Advair. Consider Pulmonary Rehab and CT Screening this year.( We will talk about this in January) Follow up with Dr. Vaughan Browner in January 2018. Please contact office for sooner follow up if symptoms do not improve or worsen or seek emergency care

## 2016-07-22 NOTE — Progress Notes (Signed)
History of Present Illness Pam Gamble is a 70 y.o. female  Former smoker ( Quit 2007) with severe COPD, asthmatic bronchitis heavy smoking history. She is a former patient of Dr. Joya Gaskins but has not seen him since 2013.    07/22/2016 6 month follow up appointment: Pt. Presents to the office today for follow up appointment. She had been  use her Advair 500/50 daily, with Albuterol rescue inhaler and Duo nebs as needed.She has run out of her Advair and has just been using her Duo Nebs and rescue inhaler when she needs them.. She has had a stable 6 month interval without exacerbations or hospitalizations. She likes to go hunting, and we discussed that she must be using her maintenance medications and have her rescue medications with her if she is going to hunt  in cold weather.She has already had her flu shot this August.   Tests:  PFTs 06/27/15 FVC 1.69 (59%) FEV1 0.98 (47%)  F/F 58 TLC 87% DLCO 46%. Severe obstructive lung disease with reduction in diffusion capacity. No bronchodilator response.  Past medical hx Past Medical History:  Diagnosis Date  . Barrett's esophagus   . Calcium deposits in tendon and bursa   . Chronic airway obstruction, not elsewhere classified   . COPD (chronic obstructive pulmonary disease) ([place])   . Depression   . Diabetes mellitus 2008ish   type two  . Esophageal reflux   . Idiopathic thrombocythemia   . Kidney stones   . Lateral epicondylitis  of elbow   . Personal history of malignant neoplasm of other parts of uterus   . Personal history of other diseases of digestive system   . Shortness of breath    upon exertion     Past surgical hx, Family hx, Social hx all reviewed.  Current Outpatient Prescriptions on File Prior to Visit  Medication Sig  . albuterol (PROVENTIL HFA;VENTOLIN HFA) 108 (90 BASE) MCG/ACT inhaler Inhale 2 puffs into the lungs every 6 (six) hours as needed for wheezing.  . B Complex-C-Folic Acid (STRESS B COMPLEX PO) Take 1  capsule by mouth daily.  . Blood Glucose Monitoring Suppl (ONE TOUCH ULTRA SYSTEM KIT) w/Device KIT 1 kit by Does not apply route once.  . Cetirizine HCl (ZYRTEC PO) Take 1 tablet by mouth daily.  . cholecalciferol (VITAMIN D) 1000 UNITS tablet Take 5,000 Units by mouth daily.  Marland Kitchen conjugated estrogens (PREMARIN) vaginal cream Place 1 Applicatorful vaginally as needed. Reported on 03/13/2016  . fluticasone (FLONASE) 50 MCG/ACT nasal spray Place 2 sprays into the nose daily as needed for rhinitis. Reported on 01/23/2016  . Fluticasone-Salmeterol (ADVAIR DISKUS) 500-50 MCG/DOSE AEPB Inhale 1 puff into the lungs 2 (two) times daily.  . furosemide (LASIX) 20 MG tablet Take 20-40 mg by mouth as needed. Reported on 03/13/2016  . GINKGO BILOBA COMPLEX PO Take 1 capsule by mouth daily.  Marland Kitchen glucose blood test strip 1 each by Other route 2 (two) times daily. DX: E11.9  . Insulin Detemir (LEVEMIR) 100 UNIT/ML Pen Titrate up by 1 unit a day for goal sugars of 100-130 up to 40 units a day  . ipratropium-albuterol (DUONEB) 0.5-2.5 (3) MG/3ML SOLN as needed.  . metFORMIN (GLUCOPHAGE) 1000 MG tablet TAKE ONE TABLET BY MOUTH TWICE DAILY WITH MEALS  . Na Sulfate-K Sulfate-Mg Sulf SOLN Take 1 kit by mouth once.  Marland Kitchen omeprazole (PRILOSEC) 40 MG capsule Take 1 capsule (40 mg total) by mouth daily.  . ONE TOUCH LANCETS MISC 1 each  by Does not apply route 2 (two) times daily.  . traMADol (ULTRAM) 50 MG tablet Take 1-2 tablets (50-100 mg total) by mouth 2 (two) times daily as needed.  Marland Kitchen VIGAMOX 0.5 % ophthalmic solution Reported on 03/13/2016   No current facility-administered medications on file prior to visit.      Allergies  Allergen Reactions  . Amoxicillin-Pot Clavulanate Nausea Only and Other (See Comments)    REACTION: diarrhea "sick to stomach"    Review Of Systems:  Constitutional:   No  weight loss, night sweats,  Fevers, chills, fatigue, or  lassitude.  HEENT:   No headaches,  Difficulty swallowing,   Tooth/dental problems, or  Sore throat,                No sneezing, itching, ear ache, nasal congestion, post nasal drip,   CV:  No chest pain,  Orthopnea, PND, swelling in lower extremities, anasarca, dizziness, palpitations, syncope.   GI  No heartburn, indigestion, abdominal pain, nausea, vomiting, diarrhea, change in bowel habits, loss of appetite, bloody stools.   Resp: + shortness of breath with exertion less at rest.  No excess mucus, no productive cough,  No non-productive cough,  No coughing up of blood.  No change in color of mucus.  + wheezing.  No chest wall deformity  Skin: no rash or lesions.  GU: no dysuria, change in color of urine, no urgency or frequency.  No flank pain, no hematuria   MS:  No joint pain or swelling.  No decreased range of motion.  No back pain.  Psych:  No change in mood or affect. No depression or anxiety.  No memory loss.   Vital Signs There were no vitals taken for this visit.   Physical Exam:  General- No distress,  A&Ox3, pleasant ENT: No sinus tenderness, TM clear, pale nasal mucosa, no oral exudate,no post nasal drip, no LAN Cardiac: S1, S2, regular rate and rhythm, no murmur Chest: No wheeze/ rales/ dullness; no accessory muscle use, no nasal flaring, no sternal retractions Abd.: Soft Non-tender Ext: No clubbing cyanosis, edema Neuro:  normal strength Skin: No rashes, warm and dry Psych: normal mood and behavior   Assessment/Plan  COPD without exacerbation Non-Compliant with Advair die to cost and [employer]  Plan: It is nice to meet you today. We will give you Samples of Breo. Take 1 puff once a day. Remember to rinse your mouth after using. Consider Pulmonary Rehab and CT Screening this year.( She cannot afford co-pay until after December) Follow up with Dr. Vaughan Browner in January 2018 months. Please contact office for sooner follow up if symptoms do not improve or worsen or seek emergency care    Pam Spatz, NP 07/22/2016   1:50 PM   Attending note: I have seen and examined the patient with nurse practitioner/resident and agree with the note. History, labs and imaging reviewed.  [name] Garfinkel MD [facility] Pager [phone] If no answer or after 3pm call: [phone] 07/25/2016, 1:43 PM

## 2016-08-26 ENCOUNTER — Ambulatory Visit: Payer: PPO | Admitting: Internal Medicine

## 2016-08-30 ENCOUNTER — Other Ambulatory Visit: Payer: Self-pay | Admitting: Internal Medicine

## 2016-09-02 ENCOUNTER — Telehealth: Payer: Self-pay | Admitting: *Deleted

## 2016-09-02 NOTE — Telephone Encounter (Signed)
Left msg on triage stating she is in the doughnut hole and needing to get refill on her insulin, but can't pay $190. Wanting to know if MD have some samples of Levimir...Johny Chess

## 2016-09-03 ENCOUNTER — Encounter: Payer: Self-pay | Admitting: Internal Medicine

## 2016-09-03 NOTE — Telephone Encounter (Signed)
No - she can apply for assistance program with Novonordisk Thx

## 2016-09-03 NOTE — Telephone Encounter (Signed)
We are out of samples of Levemir. Is there any other samples she can use in the replace of Levemir...Pam Gamble

## 2016-09-03 NOTE — Telephone Encounter (Signed)
Called pt no answer LMOM w/MD response../lmb 

## 2016-09-10 ENCOUNTER — Encounter: Payer: Self-pay | Admitting: Internal Medicine

## 2016-09-10 ENCOUNTER — Ambulatory Visit (INDEPENDENT_AMBULATORY_CARE_PROVIDER_SITE_OTHER): Payer: PPO | Admitting: Internal Medicine

## 2016-09-10 ENCOUNTER — Telehealth: Payer: Self-pay | Admitting: Pulmonary Disease

## 2016-09-10 DIAGNOSIS — Z9119 Patient's noncompliance with other medical treatment and regimen: Secondary | ICD-10-CM | POA: Diagnosis not present

## 2016-09-10 DIAGNOSIS — Z91199 Patient's noncompliance with other medical treatment and regimen due to unspecified reason: Secondary | ICD-10-CM

## 2016-09-10 DIAGNOSIS — K2270 Barrett's esophagus without dysplasia: Secondary | ICD-10-CM

## 2016-09-10 DIAGNOSIS — E1100 Type 2 diabetes mellitus with hyperosmolarity without nonketotic hyperglycemic-hyperosmolar coma (NKHHC): Secondary | ICD-10-CM

## 2016-09-10 DIAGNOSIS — Z794 Long term (current) use of insulin: Secondary | ICD-10-CM | POA: Diagnosis not present

## 2016-09-10 HISTORY — DX: Patient's noncompliance with other medical treatment and regimen due to unspecified reason: Z91.199

## 2016-09-10 HISTORY — DX: Patient's noncompliance with other medical treatment and regimen: Z91.19

## 2016-09-10 MED ORDER — INSULIN GLARGINE 300 UNIT/ML ~~LOC~~ SOPN: 24.0000 [IU] | PEN_INJECTOR | Freq: Every morning | SUBCUTANEOUS | 11 refills | Status: DC

## 2016-09-10 NOTE — Telephone Encounter (Signed)
Spoke with pt who is requesting samples of breo 200 as she is currently in the donut hole. We currently only have samples of breo 100.  PM please advise. Thnaks.

## 2016-09-10 NOTE — Assessment & Plan Note (Signed)
Discussed meds cost

## 2016-09-10 NOTE — Progress Notes (Signed)
Subjective:  Patient ID: Pam Gamble, female    DOB: 15-Sep-1946  Age: 70 y.o. MRN: [id number]  CC: No chief complaint on file.   HPI Pam Gamble presents for DM, COPD, OA. The pt ran out of Insulin due to $$... CBG 289  Outpatient Medications Prior to Visit  Medication Sig Dispense Refill  . albuterol (PROVENTIL HFA;VENTOLIN HFA) 108 (90 BASE) MCG/ACT inhaler Inhale 2 puffs into the lungs every 6 (six) hours as needed for wheezing. 1 Inhaler 5  . B Complex-C-Folic Acid (STRESS B COMPLEX PO) Take 1 capsule by mouth daily.    . Blood Glucose Monitoring Suppl (ONE TOUCH ULTRA SYSTEM KIT) w/Device KIT 1 kit by Does not apply route once. 1 each 0  . Cetirizine HCl (ZYRTEC PO) Take 1 tablet by mouth daily.    . cholecalciferol (VITAMIN D) 1000 UNITS tablet Take 5,000 Units by mouth daily.    Marland Kitchen conjugated estrogens (PREMARIN) vaginal cream Place 1 Applicatorful vaginally as needed. Reported on 03/13/2016    . fluticasone (FLONASE) 50 MCG/ACT nasal spray Place 2 sprays into the nose daily as needed for rhinitis. Reported on 01/23/2016    . Fluticasone-Salmeterol (ADVAIR DISKUS) 500-50 MCG/DOSE AEPB Inhale 1 puff into the lungs 2 (two) times daily. 60 each 6  . furosemide (LASIX) 20 MG tablet Take 20-40 mg by mouth as needed. Reported on 03/13/2016    . GINKGO BILOBA COMPLEX PO Take 1 capsule by mouth daily.    Marland Kitchen glucose blood test strip 1 each by Other route 2 (two) times daily. DX: E11.9 100 each 12  . Insulin Detemir (LEVEMIR) 100 UNIT/ML Pen Titrate up by 1 unit a day for goal sugars of 100-130 up to 40 units a day 45 mL 3  . ipratropium-albuterol (DUONEB) 0.5-2.5 (3) MG/3ML SOLN as needed.    . metFORMIN (GLUCOPHAGE) 1000 MG tablet TAKE ONE TABLET BY MOUTH TWICE DAILY WITH MEALS 180 tablet 3  . Na Sulfate-K Sulfate-Mg Sulf SOLN Take 1 kit by mouth once. 354 mL 0  . omeprazole (PRILOSEC) 40 MG capsule Take 1 capsule (40 mg total) by mouth daily. 90 capsule 3  . ONE TOUCH LANCETS MISC 1 each by  Does not apply route 2 (two) times daily. 100 each 3  . traMADol (ULTRAM) 50 MG tablet Take 1-2 tablets (50-100 mg total) by mouth 2 (two) times daily as needed. 40 tablet 0  . VIGAMOX 0.5 % ophthalmic solution Reported on 03/13/2016     No facility-administered medications prior to visit.     ROS Review of Systems  Constitutional: Negative for activity change, appetite change, chills, fatigue and unexpected weight change.  HENT: Negative for congestion, mouth sores and sinus pressure.   Eyes: Negative for visual disturbance.  Respiratory: Positive for cough and shortness of breath. Negative for chest tightness.   Gastrointestinal: Negative for abdominal pain and nausea.  Genitourinary: Negative for difficulty urinating, frequency and vaginal pain.  Musculoskeletal: Positive for arthralgias and back pain. Negative for gait problem.  Skin: Negative for pallor and rash.  Neurological: Negative for dizziness, tremors, weakness, numbness and headaches.  Psychiatric/Behavioral: Negative for confusion and sleep disturbance.    Objective:  BP 128/82   Pulse 68   Temp 97.6 F (36.4 C)   Wt 159 lb (72.1 kg)   SpO2 98%   BMI 29.08 kg/m   BP Readings from Last 3 Encounters:  09/10/16 128/82  07/22/16 122/74  05/24/16 120/60    Wt Readings from  Last 3 Encounters:  09/10/16 159 lb (72.1 kg)  07/22/16 156 lb 12.8 oz (71.1 kg)  05/24/16 160 lb (72.6 kg)    Physical Exam  Constitutional: She appears well-developed. No distress.  HENT:  Head: Normocephalic.  Right Ear: External ear normal.  Left Ear: External ear normal.  Nose: Nose normal.  Mouth/Throat: Oropharynx is clear and moist.  Eyes: Conjunctivae are normal. Pupils are equal, round, and reactive to light. Right eye exhibits no discharge. Left eye exhibits no discharge.  Neck: Normal range of motion. Neck supple. No JVD present. No tracheal deviation present. No thyromegaly present.  Cardiovascular: Normal rate, regular  rhythm and normal heart sounds.   Pulmonary/Chest: No stridor. No respiratory distress. She has no wheezes.  Abdominal: Soft. Bowel sounds are normal. She exhibits no distension and no mass. There is no tenderness. There is no rebound and no guarding.  Musculoskeletal: She exhibits no edema or tenderness.  Lymphadenopathy:    She has no cervical adenopathy.  Neurological: She displays normal reflexes. No cranial nerve deficit. She exhibits normal muscle tone. Coordination normal.  Skin: No rash noted. No erythema.  Psychiatric: She has a normal mood and affect. Her behavior is normal. Judgment and thought content normal.    Lab Results  Component Value Date   WBC 17.4 (H) 07/12/2014   HGB 12.4 07/12/2014   HCT 38.3 07/12/2014   PLT 377.0 07/12/2014   GLUCOSE 273 (H) 01/22/2016   CHOL 234 (H) 01/22/2016   TRIG 126.0 01/22/2016   HDL 55.90 01/22/2016   LDLDIRECT 160.1 07/12/2014   LDLCALC 153 (H) 01/22/2016   ALT 38 (H) 07/12/2014   AST 45 (H) 07/12/2014   NA 137 01/22/2016   K 4.3 01/22/2016   CL 99 01/22/2016   CREATININE 0.68 01/22/2016   BUN 15 01/22/2016   CO2 29 01/22/2016   TSH 1.36 01/22/2016   PSA 0.02 (L) 01/01/2008   HGBA1C 11.9 (H) 01/22/2016   MICROALBUR 10.6 (H) 12/20/2015    Dexascan  Result Date: 12/24/2015 Date of study: 12/20/15 Exam: DUAL X-RAY ABSORPTIOMETRY (DXA) FOR BONE MINERAL DENSITY (BMD) Instrument: [employer] [facility] Provider: PCP Indication: follow up for low BMD Comparison: none (please note that it is not possible to compare data from different instruments) Clinical data: Pt is a postmenopausal 70 y.o. female with no previous h/o fracture. On calcium and vitamin D. Results:  Lumbar spine (L1-L4) Femoral neck (FN) 33% distal radius T-score -1.1 RFN:-1.6 LFN:-1.4 n/a Change in BMD from previous DXA test (%) n/a n/a n/a (*) statistically significant Assessment: the BMD is low according to the [facility] classification for osteoporosis (see below).  Fracture risk: moderate FRAX score: 10 year major osteoporotic risk: 16.2%. 10 year hip fracture risk: 3.0%. These are under the thresholds for treatment of 20% and 3%, respectively. Comments: the technical quality of the study is good. Evaluation for secondary causes should be considered if clinically indicated. Recommend optimizing calcium (1200 mg/day) and vitamin D (800 IU/day) intake. Followup: Repeat BMD is appropriate after 2 years or after 1-2 years if starting treatment. WHO criteria for diagnosis of osteoporosis in postmenopausal women and in men 76 y/o or older: - normal: T-score -1.0 to + 1.0 - osteopenia/low bone density: T-score between -2.5 and -1.0 - osteoporosis: T-score below -2.5 - severe osteoporosis: T-score below -2.5 with history of fragility fracture Note: although not part of the WHO classification, the presence of a fragility fracture, regardless of the T-score, should be considered diagnostic of osteoporosis,  provided other causes for the fracture have been excluded. Treatment: The National Osteoporosis Foundation recommends that treatment be considered in postmenopausal women and men age 4 or older with: 1. Hip or vertebral (clinical or morphometric) fracture 2. T-score of - 2.5 or lower at the spine or hip 3. 10-year fracture probability by FRAX of at least 20% for a major osteoporotic fracture and 3% for a hip fracture Loura Pardon MD    Assessment & Plan:   There are no diagnoses linked to this encounter. I am having Ms. Champlain maintain her cholecalciferol, B Complex-C-Folic Acid (STRESS B COMPLEX PO), GINKGO BILOBA COMPLEX PO, Cetirizine HCl (ZYRTEC PO), fluticasone, traMADol, albuterol, Fluticasone-Salmeterol, omeprazole, ipratropium-albuterol, VIGAMOX, glucose blood, ONE TOUCH ULTRA SYSTEM KIT, ONE TOUCH LANCETS, conjugated estrogens, furosemide, Na Sulfate-K Sulfate-Mg Sulf, Insulin Detemir, and metFORMIN.  No orders of the defined types were placed in this  encounter.    Follow-up: No Follow-up on file.  Walker Kehr, MD

## 2016-09-10 NOTE — Assessment & Plan Note (Signed)
Prilosec 

## 2016-09-10 NOTE — Progress Notes (Signed)
Pre visit review using our clinic review tool, if applicable. No additional management support is needed unless otherwise documented below in the visit note. 

## 2016-09-10 NOTE — Assessment & Plan Note (Addendum)
Levemir - too $$$. Started Toujeo (10/17) samples Metformin

## 2016-09-13 MED ORDER — FLUTICASONE FUROATE-VILANTEROL 100-25 MCG/INH IN AEPB
1.0000 | INHALATION_SPRAY | Freq: Every day | RESPIRATORY_TRACT | 0 refills | Status: AC
Start: 2016-09-13 — End: 2016-09-15

## 2016-09-13 NOTE — Telephone Encounter (Signed)
OK to give samples of Breo 100

## 2016-09-13 NOTE — Telephone Encounter (Signed)
Samples placed up front for pt- pt aware.  Nothing further needed.

## 2016-09-16 ENCOUNTER — Encounter: Payer: Self-pay | Admitting: Internal Medicine

## 2016-09-16 ENCOUNTER — Other Ambulatory Visit: Payer: Self-pay | Admitting: Internal Medicine

## 2016-09-16 MED ORDER — "INSULIN SYRINGE-NEEDLE U-100 31G X 5/16"" 0.5 ML MISC": 3 refills | Status: DC

## 2016-09-16 MED ORDER — INSULIN NPH (HUMAN) (ISOPHANE) 100 UNIT/ML ~~LOC~~ SUSP: SUBCUTANEOUS | 11 refills | Status: DC

## 2016-09-20 ENCOUNTER — Telehealth: Payer: Self-pay

## 2016-09-20 NOTE — Telephone Encounter (Signed)
PA initiated via CoverMyMeds key [id number]

## 2016-09-23 ENCOUNTER — Encounter: Payer: Self-pay | Admitting: Internal Medicine

## 2016-09-25 ENCOUNTER — Ambulatory Visit (INDEPENDENT_AMBULATORY_CARE_PROVIDER_SITE_OTHER)
Admission: RE | Admit: 2016-09-25 | Discharge: 2016-09-25 | Disposition: A | Discharge status: Home / Self Care | Payer: PPO | Source: Ambulatory Visit | Attending: Nurse Practitioner | Admitting: Nurse Practitioner

## 2016-09-25 ENCOUNTER — Encounter: Payer: Self-pay | Admitting: Nurse Practitioner

## 2016-09-25 ENCOUNTER — Ambulatory Visit (INDEPENDENT_AMBULATORY_CARE_PROVIDER_SITE_OTHER): Payer: PPO | Admitting: Nurse Practitioner

## 2016-09-25 VITALS — BP 132/92 | HR 93 | Temp 97.6°F | Ht 62.0 in | Wt 157.0 lb

## 2016-09-25 DIAGNOSIS — R509 Fever, unspecified: Secondary | ICD-10-CM

## 2016-09-25 DIAGNOSIS — J441 Chronic obstructive pulmonary disease with (acute) exacerbation: Secondary | ICD-10-CM

## 2016-09-25 DIAGNOSIS — R05 Cough: Secondary | ICD-10-CM | POA: Diagnosis not present

## 2016-09-25 DIAGNOSIS — J0140 Acute pansinusitis, unspecified: Secondary | ICD-10-CM | POA: Diagnosis not present

## 2016-09-25 DIAGNOSIS — R0602 Shortness of breath: Secondary | ICD-10-CM | POA: Diagnosis not present

## 2016-09-25 MED ORDER — GUAIFENESIN ER 600 MG PO TB12: 600.0000 mg | ORAL_TABLET | Freq: Two times a day (BID) | ORAL | 0 refills | Status: DC | PRN

## 2016-09-25 MED ORDER — BENZONATATE 100 MG PO CAPS: 100.0000 mg | ORAL_CAPSULE | Freq: Three times a day (TID) | ORAL | 0 refills | Status: DC | PRN

## 2016-09-25 MED ORDER — LEVOFLOXACIN 500 MG PO TABS: 500.0000 mg | ORAL_TABLET | Freq: Every day | ORAL | 0 refills | Status: DC

## 2016-09-25 MED ORDER — ONDANSETRON HCL 4 MG PO TABS: 4.0000 mg | ORAL_TABLET | Freq: Three times a day (TID) | ORAL | 0 refills | Status: DC | PRN

## 2016-09-25 MED ORDER — IPRATROPIUM-ALBUTEROL 0.5-2.5 (3) MG/3ML IN SOLN: 3.0000 mL | Freq: Once | RESPIRATORY_TRACT | Status: DC

## 2016-09-25 NOTE — Patient Instructions (Addendum)
No pneumonia on CXR. Encourage adequate oral hydration and rest Continue inhalers as prescribed.

## 2016-09-25 NOTE — Progress Notes (Signed)
 Subjective:  Patient ID: Pam Gamble, female    DOB: 05/31/1946  Age: 70 y.o. MRN: [id number]  CC: Headache (Pt stated having headache, coughing, chills, fever for 5 days)   Cough  This is a recurrent problem. The current episode started in the past 7 days. The problem has been rapidly worsening. The cough is productive of sputum. Associated symptoms include chest pain, chills, ear congestion, ear pain, a fever, myalgias, nasal congestion, postnasal drip, rhinorrhea, a sore throat, shortness of breath and wheezing. The symptoms are aggravated by lying down. She has tried a beta-agonist inhaler, OTC cough suppressant and steroid inhaler for the symptoms. The treatment provided no relief. Her past medical history is significant for bronchitis and COPD.    Outpatient Medications Prior to Visit  Medication Sig Dispense Refill  . albuterol (PROVENTIL HFA;VENTOLIN HFA) 108 (90 BASE) MCG/ACT inhaler Inhale 2 puffs into the lungs every 6 (six) hours as needed for wheezing. 1 Inhaler 5  . B Complex-C-Folic Acid (STRESS B COMPLEX PO) Take 1 capsule by mouth daily.    . Blood Glucose Monitoring Suppl (ONE TOUCH ULTRA SYSTEM KIT) w/Device KIT 1 kit by Does not apply route once. 1 each 0  . Cetirizine HCl (ZYRTEC PO) Take 1 tablet by mouth daily.    . cholecalciferol (VITAMIN D) 1000 UNITS tablet Take 5,000 Units by mouth daily.    . conjugated estrogens (PREMARIN) vaginal cream Place 1 Applicatorful vaginally as needed. Reported on 03/13/2016    . fluticasone (FLONASE) 50 MCG/ACT nasal spray Place 2 sprays into the nose daily as needed for rhinitis. Reported on 01/23/2016    . Fluticasone-Salmeterol (ADVAIR DISKUS) 500-50 MCG/DOSE AEPB Inhale 1 puff into the lungs 2 (two) times daily. 60 each 6  . furosemide (LASIX) 20 MG tablet Take 20-40 mg by mouth as needed. Reported on 03/13/2016    . GINKGO BILOBA COMPLEX PO Take 1 capsule by mouth daily.    . glucose blood test strip 1 each by Other route 2 (two)  times daily. DX: E11.9 100 each 12  . Insulin Glargine (TOUJEO SOLOSTAR) 300 UNIT/ML SOPN Inject 24 Units into the skin every morning. Titrate up by 1 unit a day for goal sugars of 100-130 up to 40 units a day 1 pen 11  . insulin NPH Human (HUMULIN N) 100 UNIT/ML injection Start with 24 units daily. Titrate up by 1 unit a day for goal sugars of 100-130 up to 40 units a day 10 mL 11  . Insulin Syringe-Needle U-100 (B-D INS SYRINGE 0.5CC/31GX5/16) 31G X 5/16" 0.5 ML MISC Use qd 100 each 3  . ipratropium-albuterol (DUONEB) 0.5-2.5 (3) MG/3ML SOLN as needed.    . metFORMIN (GLUCOPHAGE) 1000 MG tablet TAKE ONE TABLET BY MOUTH TWICE DAILY WITH MEALS 180 tablet 3  . Na Sulfate-K Sulfate-Mg Sulf SOLN Take 1 kit by mouth once. 354 mL 0  . omeprazole (PRILOSEC) 40 MG capsule Take 1 capsule (40 mg total) by mouth daily. 90 capsule 3  . ONE TOUCH LANCETS MISC 1 each by Does not apply route 2 (two) times daily. 100 each 3  . traMADol (ULTRAM) 50 MG tablet Take 1-2 tablets (50-100 mg total) by mouth 2 (two) times daily as needed. 40 tablet 0  . VIGAMOX 0.5 % ophthalmic solution Reported on 03/13/2016     No facility-administered medications prior to visit.     ROS See HPI  Objective:  BP (!) 132/92 (BP Location: Left Arm, Patient Position: Sitting, Cuff   Size: Normal)   Pulse 93   Temp 97.6 F (36.4 C)   Ht 5' 2" (1.575 m)   Wt 157 lb (71.2 kg)   SpO2 94%   BMI 28.72 kg/m   BP Readings from Last 3 Encounters:  09/25/16 (!) 132/92  09/10/16 128/82  07/22/16 122/74    Wt Readings from Last 3 Encounters:  09/25/16 157 lb (71.2 kg)  09/10/16 159 lb (72.1 kg)  07/22/16 156 lb 12.8 oz (71.1 kg)    Physical Exam  Constitutional: She is oriented to person, place, and time. No distress.  HENT:  Right Ear: External ear normal.  Left Ear: External ear normal.  Nose: Mucosal edema and rhinorrhea present. Right sinus exhibits maxillary sinus tenderness and frontal sinus tenderness. Left sinus  exhibits maxillary sinus tenderness and frontal sinus tenderness.  Mouth/Throat: Uvula is midline. Posterior oropharyngeal erythema present. No oropharyngeal exudate.  Eyes: No scleral icterus.  Neck: Normal range of motion. Neck supple.  Cardiovascular: Normal rate, regular rhythm and normal heart sounds.   Pulmonary/Chest: Effort normal. She has wheezes. She has rales.  Abdominal: Soft. She exhibits no distension.  Lymphadenopathy:    She has no cervical adenopathy.  Neurological: She is alert and oriented to person, place, and time.  Skin: Skin is warm and dry.    Lab Results  Component Value Date   WBC 17.4 (H) 07/12/2014   HGB 12.4 07/12/2014   HCT 38.3 07/12/2014   PLT 377.0 07/12/2014   GLUCOSE 273 (H) 01/22/2016   CHOL 234 (H) 01/22/2016   TRIG 126.0 01/22/2016   HDL 55.90 01/22/2016   LDLDIRECT 160.1 07/12/2014   LDLCALC 153 (H) 01/22/2016   ALT 38 (H) 07/12/2014   AST 45 (H) 07/12/2014   NA 137 01/22/2016   K 4.3 01/22/2016   CL 99 01/22/2016   CREATININE 0.68 01/22/2016   BUN 15 01/22/2016   CO2 29 01/22/2016   TSH 1.36 01/22/2016   PSA 0.02 (L) 01/01/2008   HGBA1C 11.9 (H) 01/22/2016   MICROALBUR 10.6 (H) 12/20/2015    Dexascan  Result Date: 12/24/2015 Date of study: 12/20/15 Exam: DUAL X-RAY ABSORPTIOMETRY (DXA) FOR BONE MINERAL DENSITY (BMD) Instrument: [employer] Provider: PCP Indication: follow up for low BMD Comparison: none (please note that it is not possible to compare data from different instruments) Clinical data: Pt is a postmenopausal 70 y.o. female with no previous h/o fracture. On calcium and vitamin D. Results:  Lumbar spine (L1-L4) Femoral neck (FN) 33% distal radius T-score -1.1 RFN:-1.6 LFN:-1.4 n/a Change in BMD from previous DXA test (%) n/a n/a n/a (*) statistically significant Assessment: the BMD is low according to the [facility] classification for osteoporosis (see below). Fracture risk: moderate FRAX score: 10 year major  osteoporotic risk: 16.2%. 10 year hip fracture risk: 3.0%. These are under the thresholds for treatment of 20% and 3%, respectively. Comments: the technical quality of the study is good. Evaluation for secondary causes should be considered if clinically indicated. Recommend optimizing calcium (1200 mg/day) and vitamin D (800 IU/day) intake. Followup: Repeat BMD is appropriate after 2 years or after 1-2 years if starting treatment. WHO criteria for diagnosis of osteoporosis in postmenopausal women and in men 15 y/o or older: - normal: T-score -1.0 to + 1.0 - osteopenia/low bone density: T-score between -2.5 and -1.0 - osteoporosis: T-score below -2.5 - severe osteoporosis: T-score below -2.5 with history of fragility fracture Note: although not part of the WHO classification, the presence of a  fragility fracture, regardless of the T-score, should be considered diagnostic of osteoporosis, provided other causes for the fracture have been excluded. Treatment: The National Osteoporosis Foundation recommends that treatment be considered in postmenopausal women and men age 5 or older with: 1. Hip or vertebral (clinical or morphometric) fracture 2. T-score of - 2.5 or lower at the spine or hip 3. 10-year fracture probability by FRAX of at least 20% for a major osteoporotic fracture and 3% for a hip fracture Loura Pardon MD    Assessment & Plan:   Tashari was seen today for headache.  Diagnoses and all orders for this visit:  Acute non-recurrent pansinusitis -     ipratropium-albuterol (DUONEB) 0.5-2.5 (3) MG/3ML nebulizer solution 3 mL; Take 3 mLs by nebulization once. -     benzonatate (TESSALON) 100 MG capsule; Take 1 capsule (100 mg total) by mouth 3 (three) times daily as needed for cough. -     guaiFENesin (MUCINEX) 600 MG 12 hr tablet; Take 1 tablet (600 mg total) by mouth 2 (two) times daily as needed for cough or to loosen phlegm. -     levofloxacin (LEVAQUIN) 500 MG tablet; Take 1 tablet (500 mg total)  by mouth daily.  C O P D WITH ACUTE EXACERBATION -     ipratropium-albuterol (DUONEB) 0.5-2.5 (3) MG/3ML nebulizer solution 3 mL; Take 3 mLs by nebulization once. -     benzonatate (TESSALON) 100 MG capsule; Take 1 capsule (100 mg total) by mouth 3 (three) times daily as needed for cough. -     guaiFENesin (MUCINEX) 600 MG 12 hr tablet; Take 1 tablet (600 mg total) by mouth 2 (two) times daily as needed for cough or to loosen phlegm. -     levofloxacin (LEVAQUIN) 500 MG tablet; Take 1 tablet (500 mg total) by mouth daily. -     DG Chest 2 View; Future  Fever and chills -     DG Chest 2 View; Future  Other orders -     ondansetron (ZOFRAN) 4 MG tablet; Take 1 tablet (4 mg total) by mouth every 8 (eight) hours as needed for nausea or vomiting.   I am having Ms. Sandoz start on benzonatate, guaiFENesin, levofloxacin, and ondansetron. I am also having her maintain her cholecalciferol, B Complex-C-Folic Acid (STRESS B COMPLEX PO), GINKGO BILOBA COMPLEX PO, Cetirizine HCl (ZYRTEC PO), fluticasone, traMADol, albuterol, Fluticasone-Salmeterol, omeprazole, ipratropium-albuterol, VIGAMOX, glucose blood, ONE TOUCH ULTRA SYSTEM KIT, ONE TOUCH LANCETS, conjugated estrogens, furosemide, Na Sulfate-K Sulfate-Mg Sulf, metFORMIN, Insulin Glargine, insulin NPH Human, and Insulin Syringe-Needle U-100. We will continue to administer ipratropium-albuterol.  Meds ordered this encounter  Medications  . ipratropium-albuterol (DUONEB) 0.5-2.5 (3) MG/3ML nebulizer solution 3 mL  . benzonatate (TESSALON) 100 MG capsule    Sig: Take 1 capsule (100 mg total) by mouth 3 (three) times daily as needed for cough.    Dispense:  20 capsule    Refill:  0    Order Specific Question:   Supervising Provider    Answer:   Cassandria Anger [[id number]]  . guaiFENesin (MUCINEX) 600 MG 12 hr tablet    Sig: Take 1 tablet (600 mg total) by mouth 2 (two) times daily as needed for cough or to loosen phlegm.    Dispense:  14 tablet     Refill:  0    Order Specific Question:   Supervising Provider    Answer:   Cassandria Anger [[id number]]  . levofloxacin (LEVAQUIN) 500 MG tablet  Sig: Take 1 tablet (500 mg total) by mouth daily.    Dispense:  7 tablet    Refill:  0    Order Specific Question:   Supervising Provider    Answer:   Cassandria Anger [[id number]]  . ondansetron (ZOFRAN) 4 MG tablet    Sig: Take 1 tablet (4 mg total) by mouth every 8 (eight) hours as needed for nausea or vomiting.    Dispense:  20 tablet    Refill:  0    Order Specific Question:   Supervising Provider    Answer:   Cassandria Anger [[id number]]    Follow-up: Return if symptoms worsen or fail to improve.  Wilfred Lacy, NP

## 2016-09-25 NOTE — Progress Notes (Signed)
Pre visit review using our clinic review tool, if applicable. No additional management support is needed unless otherwise documented below in the visit note. 

## 2016-09-25 NOTE — Telephone Encounter (Signed)
PA APPROVED, pharmacy advised via phone

## 2016-09-27 ENCOUNTER — Telehealth: Payer: Self-pay

## 2016-09-27 NOTE — Telephone Encounter (Signed)
PA initiated via CoverMyMeds key [id number]

## 2016-09-30 MED ORDER — PROMETHAZINE HCL 12.5 MG PO TABS: 12.5000 mg | ORAL_TABLET | Freq: Three times a day (TID) | ORAL | 0 refills | Status: DC | PRN

## 2016-09-30 NOTE — Telephone Encounter (Signed)
Promethazine called into pharmacy, Zofran cancelled with pharmacy.

## 2016-09-30 NOTE — Telephone Encounter (Signed)
Ok to switch to promethazine 12.5mg  oral  1tab every 6hrs prn for nausea, # 20, no refills. Thank you

## 2016-09-30 NOTE — Telephone Encounter (Signed)
PA for Zofran DENIED - medically acceptable diagnosis for this medication are:  N&V associated with cancer chemotherapy; prevention of radiation induced N&V; preventative of postoperative N&V; gastroenteritis related N&V in peds; and hyperemesis gravidarum.  Please advise on alternative medication, thanks!

## 2016-10-01 ENCOUNTER — Telehealth: Payer: Self-pay | Admitting: Internal Medicine

## 2016-10-01 NOTE — Telephone Encounter (Signed)
Patient called in to advise that she realized she forgot to fill one of the RX given to her. She could not remember which one. She states that she will try it and see if she gets any better.

## 2016-10-01 NOTE — Telephone Encounter (Signed)
Pam Gamble has been advised

## 2016-10-01 NOTE — Telephone Encounter (Signed)
Pt called in said that she is not feeling any better, still has a headache.  She would like nurse to call her

## 2016-10-09 ENCOUNTER — Encounter: Payer: Self-pay | Admitting: Internal Medicine

## 2016-10-09 ENCOUNTER — Ambulatory Visit (INDEPENDENT_AMBULATORY_CARE_PROVIDER_SITE_OTHER): Payer: PPO | Admitting: Internal Medicine

## 2016-10-09 VITALS — BP 128/70 | HR 99 | Temp 98.4°F | Resp 20 | Wt 155.0 lb

## 2016-10-09 DIAGNOSIS — R05 Cough: Secondary | ICD-10-CM

## 2016-10-09 DIAGNOSIS — E1100 Type 2 diabetes mellitus with hyperosmolarity without nonketotic hyperglycemic-hyperosmolar coma (NKHHC): Secondary | ICD-10-CM

## 2016-10-09 DIAGNOSIS — J441 Chronic obstructive pulmonary disease with (acute) exacerbation: Secondary | ICD-10-CM

## 2016-10-09 DIAGNOSIS — J309 Allergic rhinitis, unspecified: Secondary | ICD-10-CM

## 2016-10-09 DIAGNOSIS — R059 Cough, unspecified: Secondary | ICD-10-CM

## 2016-10-09 DIAGNOSIS — Z794 Long term (current) use of insulin: Secondary | ICD-10-CM

## 2016-10-09 MED ORDER — ONDANSETRON HCL 4 MG PO TABS: 4.0000 mg | ORAL_TABLET | Freq: Three times a day (TID) | ORAL | 0 refills | Status: DC | PRN

## 2016-10-09 MED ORDER — PREDNISONE 10 MG PO TABS: ORAL_TABLET | ORAL | 0 refills | Status: DC

## 2016-10-09 MED ORDER — AZITHROMYCIN 250 MG PO TABS: ORAL_TABLET | ORAL | 1 refills | Status: DC

## 2016-10-09 NOTE — Progress Notes (Signed)
Subjective:    Patient ID: Pam Gamble, female    DOB: 1946-10-27, 70 y.o.   MRN: [id number]  HPI  Here with acute onset mild to mod 2-3 days ST, HA, general weakness and malaise, with prod cough greenish sputum, and night sweats but Pt denies chest pain, increased sob or doe, wheezing, orthopnea, PND, increased LE swelling, palpitations, dizziness or syncope. Except for worsening wheezing, sob since last PM. Advair too expensive, ran out. Using leftover breo.  Does have several wks ongoing nasal allergy symptoms with clearish congestion, itch and sneezing, without fever, pain, ST, cough, swelling or wheezing.  Pt denies polydipsia, polyuria  Past Medical History:  Diagnosis Date  . Barrett's esophagus   . Calcium deposits in tendon and bursa   . Chronic airway obstruction, not elsewhere classified   . COPD (chronic obstructive pulmonary disease) ([place])   . Depression   . Diabetes mellitus 2008ish   type two  . Esophageal reflux   . Idiopathic thrombocythemia   . Kidney stones   . Lateral epicondylitis  of elbow   . Personal history of malignant neoplasm of other parts of uterus   . Personal history of other diseases of digestive system   . Shortness of breath    upon exertion   Past Surgical History:  Procedure Laterality Date  . ABDOMINAL HYSTERECTOMY    . CATARACT EXTRACTION Bilateral 01/2016 and 03/2016  . LITHOTRIPSY    . REFRACTIVE SURGERY Right 05/16/2016  . SPLENECTOMY  1999  . TONSILLECTOMY AND ADENOIDECTOMY      reports that she quit smoking about 10 years ago. Her smoking use included Cigarettes. She has a 98.00 pack-year smoking history. She has never used smokeless tobacco. She reports that she drinks alcohol. She reports that she does not use drugs. family history includes Diabetes in her father, maternal aunt, and sister; Heart disease in her father and mother; Liver cancer in her sister; Pancreatic cancer in her sister; Stroke in her mother. Allergies  Allergen  Reactions  . Amoxicillin-Pot Clavulanate Nausea Only and Other (See Comments)    REACTION: diarrhea "sick to stomach"  . Levaquin [Levofloxacin] Nausea Only   Current Outpatient Prescriptions on File Prior to Visit  Medication Sig Dispense Refill  . albuterol (PROVENTIL HFA;VENTOLIN HFA) 108 (90 BASE) MCG/ACT inhaler Inhale 2 puffs into the lungs every 6 (six) hours as needed for wheezing. 1 Inhaler 5  . B Complex-C-Folic Acid (STRESS B COMPLEX PO) Take 1 capsule by mouth daily.    . benzonatate (TESSALON) 100 MG capsule Take 1 capsule (100 mg total) by mouth 3 (three) times daily as needed for cough. 20 capsule 0  . Blood Glucose Monitoring Suppl (ONE TOUCH ULTRA SYSTEM KIT) w/Device KIT 1 kit by Does not apply route once. 1 each 0  . Cetirizine HCl (ZYRTEC PO) Take 1 tablet by mouth daily.    . cholecalciferol (VITAMIN D) 1000 UNITS tablet Take 5,000 Units by mouth daily.    Marland Kitchen conjugated estrogens (PREMARIN) vaginal cream Place 1 Applicatorful vaginally as needed. Reported on 03/13/2016    . fluticasone (FLONASE) 50 MCG/ACT nasal spray Place 2 sprays into the nose daily as needed for rhinitis. Reported on 01/23/2016    . Fluticasone-Salmeterol (ADVAIR DISKUS) 500-50 MCG/DOSE AEPB Inhale 1 puff into the lungs 2 (two) times daily. 60 each 6  . furosemide (LASIX) 20 MG tablet Take 20-40 mg by mouth as needed. Reported on 03/13/2016    . GINKGO BILOBA COMPLEX PO Take  1 capsule by mouth daily.    Marland Kitchen glucose blood test strip 1 each by Other route 2 (two) times daily. DX: E11.9 100 each 12  . guaiFENesin (MUCINEX) 600 MG 12 hr tablet Take 1 tablet (600 mg total) by mouth 2 (two) times daily as needed for cough or to loosen phlegm. 14 tablet 0  . Insulin Glargine (TOUJEO SOLOSTAR) 300 UNIT/ML SOPN Inject 24 Units into the skin every morning. Titrate up by 1 unit a day for goal sugars of 100-130 up to 40 units a day 1 pen 11  . insulin NPH Human (HUMULIN N) 100 UNIT/ML injection Start with 24 units daily.  Titrate up by 1 unit a day for goal sugars of 100-130 up to 40 units a day 10 mL 11  . Insulin Syringe-Needle U-100 (B-D INS SYRINGE 0.5CC/31GX5/16) 31G X 5/16" 0.5 ML MISC Use qd 100 each 3  . ipratropium-albuterol (DUONEB) 0.5-2.5 (3) MG/3ML SOLN as needed.    Marland Kitchen levofloxacin (LEVAQUIN) 500 MG tablet Take 1 tablet (500 mg total) by mouth daily. 7 tablet 0  . metFORMIN (GLUCOPHAGE) 1000 MG tablet TAKE ONE TABLET BY MOUTH TWICE DAILY WITH MEALS 180 tablet 3  . Na Sulfate-K Sulfate-Mg Sulf SOLN Take 1 kit by mouth once. 354 mL 0  . omeprazole (PRILOSEC) 40 MG capsule Take 1 capsule (40 mg total) by mouth daily. 90 capsule 3  . ONE TOUCH LANCETS MISC 1 each by Does not apply route 2 (two) times daily. 100 each 3  . promethazine (PHENERGAN) 12.5 MG tablet Take 1 tablet (12.5 mg total) by mouth every 8 (eight) hours as needed for nausea or vomiting. 20 tablet 0  . traMADol (ULTRAM) 50 MG tablet Take 1-2 tablets (50-100 mg total) by mouth 2 (two) times daily as needed. 40 tablet 0  . VIGAMOX 0.5 % ophthalmic solution Reported on 03/13/2016     Current Facility-Administered Medications on File Prior to Visit  Medication Dose Route Frequency Provider Last Rate Last Dose  . ipratropium-albuterol (DUONEB) 0.5-2.5 (3) MG/3ML nebulizer solution 3 mL  3 mL Nebulization Once Flossie Buffy, NP       Review of Systems  Constitutional: Negative for unusual diaphoresis or night sweats HENT: Negative for ear swelling or discharge Eyes: Negative for worsening visual haziness  Respiratory: Negative for choking and stridor.   Gastrointestinal: Negative for distension or worsening eructation Genitourinary: Negative for retention or change in urine volume.  Musculoskeletal: Negative for other MSK pain or swelling Skin: Negative for color change and worsening wound Neurological: Negative for tremors and numbness other than noted  Psychiatric/Behavioral: Negative for decreased concentration or agitation other  than above   All other system neg per pt    Objective:   Physical Exam BP 128/70   Pulse 99   Temp 98.4 F (36.9 C) (Oral)   Resp 20   Wt 155 lb (70.3 kg)   SpO2 99%   BMI 28.35 kg/m  VS noted, mild ill Constitutional: Pt appears in no apparent distress HENT: Head: NCAT.  Right Ear: External ear normal.  Left Ear: External ear normal.  Eyes: . Pupils are equal, round, and reactive to light. Conjunctivae and EOM are normal Bilat tm's with mild erythema.  Max sinus areas mild tender.  Pharynx with mild erythema, no exudate Neck: Normal range of motion. Neck supple.  Cardiovascular: Normal rate and regular rhythm.   Pulmonary/Chest: Effort normal and breath sounds decreased without rales but with mild bilat wheezing.  Neurological:  Pt is alert. Not confused , motor grossly intact Skin: Skin is warm. No rash, no LE edema Psychiatric: Pt behavior is normal. No agitation.  No other new exam findings    Assessment & Plan:

## 2016-10-09 NOTE — Patient Instructions (Addendum)
You had the steroid shot today  Please take all new medication as prescribed - the antibiotic, prednisone, and the generic zofran for nausea  Please continue all other medications as before, including your inhalers and nebulizers  Please call if you change your mind about restarting the Advair  Please have the pharmacy call with any other refills you may need.  Please keep your appointments with your specialists as you may have planned

## 2016-10-09 NOTE — Progress Notes (Signed)
Pre visit review using our clinic review tool, if applicable. No additional management support is needed unless otherwise documented below in the visit note. 

## 2016-10-13 NOTE — Assessment & Plan Note (Addendum)
Mild to mod, c/w pna vs bronchitis, declines cxr, for antibx course, cough med prn, to f/u any worsening symptoms or concerns, also zofran prn nausea

## 2016-10-13 NOTE — Assessment & Plan Note (Signed)
Mild to mod, to improve with steroid tx today,  to f/u any worsening symptoms or concerns

## 2016-10-13 NOTE — Assessment & Plan Note (Addendum)
Asympt, o/w stable overall by history and exam, and pt to continue medical treatment as before,  to f/u any worsening symptoms or concerns, pt to call for onset polys or cbg > 200

## 2016-10-13 NOTE — Assessment & Plan Note (Signed)
Mild to mod, for depomedrol IM, predpac asd,,  to f/u any worsening symptoms or concerns 

## 2016-11-26 ENCOUNTER — Ambulatory Visit: Payer: PPO | Admitting: Pulmonary Disease

## 2016-12-10 ENCOUNTER — Ambulatory Visit: Payer: Medicare HMO | Admitting: Internal Medicine

## 2016-12-12 ENCOUNTER — Encounter: Payer: Self-pay | Admitting: Internal Medicine

## 2016-12-12 ENCOUNTER — Other Ambulatory Visit (INDEPENDENT_AMBULATORY_CARE_PROVIDER_SITE_OTHER): Payer: Medicare HMO

## 2016-12-12 ENCOUNTER — Ambulatory Visit (INDEPENDENT_AMBULATORY_CARE_PROVIDER_SITE_OTHER): Payer: Medicare HMO | Admitting: Internal Medicine

## 2016-12-12 VITALS — BP 152/72 | HR 95 | Temp 98.6°F | Resp 20 | Ht 62.0 in | Wt 160.1 lb

## 2016-12-12 DIAGNOSIS — E1100 Type 2 diabetes mellitus with hyperosmolarity without nonketotic hyperglycemic-hyperosmolar coma (NKHHC): Secondary | ICD-10-CM

## 2016-12-12 DIAGNOSIS — F330 Major depressive disorder, recurrent, mild: Secondary | ICD-10-CM

## 2016-12-12 DIAGNOSIS — Z794 Long term (current) use of insulin: Secondary | ICD-10-CM | POA: Diagnosis not present

## 2016-12-12 DIAGNOSIS — E785 Hyperlipidemia, unspecified: Secondary | ICD-10-CM

## 2016-12-12 DIAGNOSIS — K2270 Barrett's esophagus without dysplasia: Secondary | ICD-10-CM | POA: Diagnosis not present

## 2016-12-12 DIAGNOSIS — J441 Chronic obstructive pulmonary disease with (acute) exacerbation: Secondary | ICD-10-CM

## 2016-12-12 DIAGNOSIS — J0140 Acute pansinusitis, unspecified: Secondary | ICD-10-CM

## 2016-12-12 LAB — LIPID PANEL
CHOLESTEROL: 228 mg/dL — AB (ref 0–200)
HDL: 52.6 mg/dL (ref >39.00)
LDL Cholesterol: 153 mg/dL — ABNORMAL HIGH (ref 0–99)
NonHDL: 175.58
TRIGLYCERIDES: 114 mg/dL (ref 0.0–149.0)
Total CHOL/HDL Ratio: 4
VLDL: 22.8 mg/dL (ref 0.0–40.0)

## 2016-12-12 LAB — CBC WITH DIFFERENTIAL/PLATELET
BASOS PCT: 0.5 % (ref 0.0–3.0)
Basophils Absolute: 0.1 10*3/uL (ref 0.0–0.1)
EOS PCT: 1.7 % (ref 0.0–5.0)
Eosinophils Absolute: 0.3 10*3/uL (ref 0.0–0.7)
HEMATOCRIT: 38.3 % (ref 36.0–46.0)
Hemoglobin: 12.1 g/dL (ref 12.0–15.0)
Lymphocytes Relative: 35.8 % (ref 12.0–46.0)
Lymphs Abs: 6 10*3/uL — ABNORMAL HIGH (ref 0.7–4.0)
MCHC: 31.7 g/dL (ref 30.0–36.0)
MCV: 90.4 fl (ref 78.0–100.0)
MONOS PCT: 5.9 % (ref 3.0–12.0)
Monocytes Absolute: 1 10*3/uL (ref 0.1–1.0)
Neutro Abs: 9.4 10*3/uL — ABNORMAL HIGH (ref 1.4–7.7)
Neutrophils Relative %: 56.1 % (ref 43.0–77.0)
Platelets: 439 10*3/uL — ABNORMAL HIGH (ref 150.0–400.0)
RBC: 4.24 Mil/uL (ref 3.87–5.11)
RDW: 14.6 % (ref 11.5–15.5)
WBC: 16.7 10*3/uL — AB (ref 4.0–10.5)

## 2016-12-12 LAB — MICROALBUMIN / CREATININE URINE RATIO
Creatinine,U: 84.5 mg/dL
MICROALB/CREAT RATIO: 7.6 mg/g (ref 0.0–30.0)
Microalb, Ur: 6.4 mg/dL — ABNORMAL HIGH (ref 0.0–1.9)

## 2016-12-12 LAB — BASIC METABOLIC PANEL
BUN: 18 mg/dL (ref 6–23)
CALCIUM: 9.6 mg/dL (ref 8.4–10.5)
CO2: 29 mEq/L (ref 19–32)
CREATININE: 0.88 mg/dL (ref 0.40–1.20)
Chloride: 100 mEq/L (ref 96–112)
GFR: 67.41 mL/min (ref >60.00)
Glucose, Bld: 243 mg/dL — ABNORMAL HIGH (ref 70–99)
Potassium: 4.3 mEq/L (ref 3.5–5.1)
SODIUM: 136 meq/L (ref 135–145)

## 2016-12-12 LAB — URINALYSIS
Bilirubin Urine: NEGATIVE
HGB URINE DIPSTICK: NEGATIVE
Ketones, ur: NEGATIVE
LEUKOCYTES UA: NEGATIVE
NITRITE: NEGATIVE
Specific Gravity, Urine: 1.02 (ref 1.000–1.030)
UROBILINOGEN UA: 0.2 (ref 0.0–1.0)
Urine Glucose: >=1000 — AB
pH: 5.5 (ref 5.0–8.0)

## 2016-12-12 LAB — HEMOGLOBIN A1C

## 2016-12-12 LAB — TSH: TSH: 1.33 u[IU]/mL (ref 0.35–4.50)

## 2016-12-12 MED ORDER — PREDNISONE 10 MG PO TABS: ORAL_TABLET | ORAL | 0 refills | Status: DC

## 2016-12-12 MED ORDER — UMECLIDINIUM-VILANTEROL 62.5-25 MCG/INH IN AEPB: 1.0000 | INHALATION_SPRAY | Freq: Every day | RESPIRATORY_TRACT | 11 refills | Status: DC

## 2016-12-12 MED ORDER — AZITHROMYCIN 250 MG PO TABS: ORAL_TABLET | ORAL | 0 refills | Status: DC

## 2016-12-12 MED ORDER — LEVOFLOXACIN 500 MG PO TABS: 500.0000 mg | ORAL_TABLET | Freq: Every day | ORAL | 0 refills | Status: DC

## 2016-12-12 MED ORDER — UMECLIDINIUM-VILANTEROL 62.5-25 MCG/INH IN AEPB: 1.0000 | INHALATION_SPRAY | Freq: Every day | RESPIRATORY_TRACT | 3 refills | Status: DC

## 2016-12-12 NOTE — Assessment & Plan Note (Signed)
Anoro added Labs Levaquin and Prednisone rx

## 2016-12-12 NOTE — Assessment & Plan Note (Signed)
Worse. Titrate insulin up

## 2016-12-12 NOTE — Progress Notes (Signed)
Subjective:  Patient ID: Pam Gamble, female    DOB: 06/05/1946  Age: 71 y.o. MRN: [id number]  CC: No chief complaint on file.   HPI Pam Gamble presents for COPD, DM, GERD, HTN f/u. C/o wheezing  Outpatient Medications Prior to Visit  Medication Sig Dispense Refill  . albuterol (PROVENTIL HFA;VENTOLIN HFA) 108 (90 BASE) MCG/ACT inhaler Inhale 2 puffs into the lungs every 6 (six) hours as needed for wheezing. 1 Inhaler 5  . B Complex-C-Folic Acid (STRESS B COMPLEX PO) Take 1 capsule by mouth daily.    . Blood Glucose Monitoring Suppl (ONE TOUCH ULTRA SYSTEM KIT) w/Device KIT 1 kit by Does not apply route once. 1 each 0  . Cetirizine HCl (ZYRTEC PO) Take 1 tablet by mouth daily.    . cholecalciferol (VITAMIN D) 1000 UNITS tablet Take 5,000 Units by mouth daily.    . Fluticasone-Salmeterol (ADVAIR DISKUS) 500-50 MCG/DOSE AEPB Inhale 1 puff into the lungs 2 (two) times daily. 60 each 6  . GINKGO BILOBA COMPLEX PO Take 1 capsule by mouth daily.    Marland Kitchen glucose blood test strip 1 each by Other route 2 (two) times daily. DX: E11.9 100 each 12  . Insulin Syringe-Needle U-100 (B-D INS SYRINGE 0.5CC/31GX5/16) 31G X 5/16" 0.5 ML MISC Use qd 100 each 3  . ipratropium-albuterol (DUONEB) 0.5-2.5 (3) MG/3ML SOLN as needed.    . metFORMIN (GLUCOPHAGE) 1000 MG tablet TAKE ONE TABLET BY MOUTH TWICE DAILY WITH MEALS 180 tablet 3  . Na Sulfate-K Sulfate-Mg Sulf SOLN Take 1 kit by mouth once. 354 mL 0  . omeprazole (PRILOSEC) 40 MG capsule Take 1 capsule (40 mg total) by mouth daily. 90 capsule 3  . ondansetron (ZOFRAN) 4 MG tablet Take 1 tablet (4 mg total) by mouth every 8 (eight) hours as needed for nausea or vomiting. 30 tablet 0  . ONE TOUCH LANCETS MISC 1 each by Does not apply route 2 (two) times daily. 100 each 3  . azithromycin (ZITHROMAX Z-PAK) 250 MG tablet 2 tab by mouth on day 1, then 1 per day (Patient not taking: Reported on 12/12/2016) 6 tablet 1  . benzonatate (TESSALON) 100 MG capsule Take 1  capsule (100 mg total) by mouth 3 (three) times daily as needed for cough. (Patient not taking: Reported on 12/12/2016) 20 capsule 0  . conjugated estrogens (PREMARIN) vaginal cream Place 1 Applicatorful vaginally as needed. Reported on 03/13/2016    . fluticasone (FLONASE) 50 MCG/ACT nasal spray Place 2 sprays into the nose daily as needed for rhinitis. Reported on 01/23/2016    . furosemide (LASIX) 20 MG tablet Take 20-40 mg by mouth as needed. Reported on 03/13/2016    . guaiFENesin (MUCINEX) 600 MG 12 hr tablet Take 1 tablet (600 mg total) by mouth 2 (two) times daily as needed for cough or to loosen phlegm. (Patient not taking: Reported on 12/12/2016) 14 tablet 0  . Insulin Glargine (TOUJEO SOLOSTAR) 300 UNIT/ML SOPN Inject 24 Units into the skin every morning. Titrate up by 1 unit a day for goal sugars of 100-130 up to 40 units a day (Patient not taking: Reported on 12/12/2016) 1 pen 11  . insulin NPH Human (HUMULIN N) 100 UNIT/ML injection Start with 24 units daily. Titrate up by 1 unit a day for goal sugars of 100-130 up to 40 units a day (Patient not taking: Reported on 12/12/2016) 10 mL 11  . levofloxacin (LEVAQUIN) 500 MG tablet Take 1 tablet (500 mg total)  by mouth daily. (Patient not taking: Reported on 12/12/2016) 7 tablet 0  . predniSONE (DELTASONE) 10 MG tablet 4tab by mouth x 3day,3tab x 3days,2tab x 3 days, 1tab x 3 days (Patient not taking: Reported on 12/12/2016) 30 tablet 0  . promethazine (PHENERGAN) 12.5 MG tablet Take 1 tablet (12.5 mg total) by mouth every 8 (eight) hours as needed for nausea or vomiting. (Patient not taking: Reported on 12/12/2016) 20 tablet 0  . traMADol (ULTRAM) 50 MG tablet Take 1-2 tablets (50-100 mg total) by mouth 2 (two) times daily as needed. (Patient not taking: Reported on 12/12/2016) 40 tablet 0  . VIGAMOX 0.5 % ophthalmic solution Reported on 03/13/2016     Facility-Administered Medications Prior to Visit  Medication Dose Route Frequency Provider Last Rate Last Dose  .  ipratropium-albuterol (DUONEB) 0.5-2.5 (3) MG/3ML nebulizer solution 3 mL  3 mL Nebulization Once Flossie Buffy, NP        ROS Review of Systems  Constitutional: Positive for fatigue. Negative for activity change, appetite change, chills and unexpected weight change.  HENT: Negative for congestion, mouth sores and sinus pressure.   Eyes: Negative for visual disturbance.  Respiratory: Positive for shortness of breath and wheezing. Negative for cough and chest tightness.   Gastrointestinal: Negative for abdominal pain and nausea.  Genitourinary: Negative for difficulty urinating, frequency and vaginal pain.  Musculoskeletal: Negative for back pain and gait problem.  Skin: Negative for pallor and rash.  Neurological: Negative for dizziness, tremors, weakness, numbness and headaches.  Psychiatric/Behavioral: Negative for confusion, sleep disturbance and suicidal ideas.    Objective:  BP (!) 152/72   Pulse 95   Temp 98.6 F (37 C) (Oral)   Resp 20   Ht '5\' 2"'  (1.575 m)   Wt 160 lb 1.9 oz (72.6 kg)   SpO2 96%   BMI 29.29 kg/m   BP Readings from Last 3 Encounters:  12/12/16 (!) 152/72  10/09/16 128/70  09/25/16 (!) 132/92    Wt Readings from Last 3 Encounters:  12/12/16 160 lb 1.9 oz (72.6 kg)  10/09/16 155 lb (70.3 kg)  09/25/16 157 lb (71.2 kg)    Physical Exam  Constitutional: She appears well-developed. No distress.  HENT:  Head: Normocephalic.  Right Ear: External ear normal.  Left Ear: External ear normal.  Nose: Nose normal.  Mouth/Throat: Oropharynx is clear and moist.  Eyes: Conjunctivae are normal. Pupils are equal, round, and reactive to light. Right eye exhibits no discharge. Left eye exhibits no discharge.  Neck: Normal range of motion. Neck supple. No JVD present. No tracheal deviation present. No thyromegaly present.  Cardiovascular: Normal rate, regular rhythm and normal heart sounds.   Pulmonary/Chest: No stridor. No respiratory distress. She has  wheezes.  Abdominal: Soft. Bowel sounds are normal. She exhibits no distension and no mass. There is no tenderness. There is no rebound and no guarding.  Musculoskeletal: She exhibits no edema or tenderness.  Lymphadenopathy:    She has no cervical adenopathy.  Neurological: She displays normal reflexes. No cranial nerve deficit. She exhibits normal muscle tone. Coordination normal.  Skin: No rash noted. No erythema.  Psychiatric: She has a normal mood and affect. Her behavior is normal. Judgment and thought content normal.  decr BS B  Lab Results  Component Value Date   WBC 17.4 (H) 07/12/2014   HGB 12.4 07/12/2014   HCT 38.3 07/12/2014   PLT 377.0 07/12/2014   GLUCOSE 273 (H) 01/22/2016   CHOL 234 (H) 01/22/2016  TRIG 126.0 01/22/2016   HDL 55.90 01/22/2016   LDLDIRECT 160.1 07/12/2014   LDLCALC 153 (H) 01/22/2016   ALT 38 (H) 07/12/2014   AST 45 (H) 07/12/2014   NA 137 01/22/2016   K 4.3 01/22/2016   CL 99 01/22/2016   CREATININE 0.68 01/22/2016   BUN 15 01/22/2016   CO2 29 01/22/2016   TSH 1.36 01/22/2016   PSA 0.02 (L) 01/01/2008   HGBA1C 11.9 (H) 01/22/2016   MICROALBUR 10.6 (H) 12/20/2015    Dg Chest 2 View  Result Date: 09/25/2016 CLINICAL DATA:  Cough, fever, shortness of breath. Chills. Obstructive chronic bronchitis with exacerbation. EXAM: CHEST  2 VIEW COMPARISON:  12/21/2013 FINDINGS: The cardiomediastinal contours are unchanged, heart at the upper limits normal in size. There is biapical pleural parenchymal scarring. Pulmonary vasculature is normal. No consolidation, pleural effusion, or pneumothorax. No acute osseous abnormalities are seen. IMPRESSION: No acute pulmonary process. Electronically Signed   By: Jeb Levering M.D.   On: 09/25/2016 17:28    Assessment & Plan:   There are no diagnoses linked to this encounter. I am having Ms. Paster maintain her cholecalciferol, B Complex-C-Folic Acid (STRESS B COMPLEX PO), GINKGO BILOBA COMPLEX PO, Cetirizine  HCl (ZYRTEC PO), fluticasone, traMADol, albuterol, Fluticasone-Salmeterol, omeprazole, ipratropium-albuterol, VIGAMOX, glucose blood, ONE TOUCH ULTRA SYSTEM KIT, ONE TOUCH LANCETS, conjugated estrogens, furosemide, Na Sulfate-K Sulfate-Mg Sulf, metFORMIN, Insulin Glargine, insulin NPH Human, Insulin Syringe-Needle U-100, benzonatate, guaiFENesin, levofloxacin, promethazine, azithromycin, predniSONE, ondansetron, and insulin detemir. We will continue to administer ipratropium-albuterol.  Meds ordered this encounter  Medications  . insulin detemir (LEVEMIR) 100 UNIT/ML injection    Sig: Inject 20 Units into the skin at bedtime.     Follow-up: No Follow-up on file.  Walker Kehr, MD

## 2016-12-12 NOTE — Assessment & Plan Note (Signed)
The pt will see Dr Vaughan Browner in Feb. On Advair  Given Anoro sample/Rx

## 2016-12-12 NOTE — Progress Notes (Signed)
Pre visit review using our clinic review tool, if applicable. No additional management support is needed unless otherwise documented below in the visit note. 

## 2016-12-12 NOTE — Assessment & Plan Note (Signed)
On Prilosec

## 2016-12-12 NOTE — Assessment & Plan Note (Signed)
Chronic  Not on meds 

## 2017-01-09 ENCOUNTER — Ambulatory Visit: Payer: Medicare HMO | Admitting: Pulmonary Disease

## 2017-01-09 DIAGNOSIS — R69 Illness, unspecified: Secondary | ICD-10-CM | POA: Diagnosis not present

## 2017-01-17 ENCOUNTER — Telehealth: Payer: Self-pay | Admitting: Internal Medicine

## 2017-01-17 NOTE — Telephone Encounter (Signed)
[facility]  Patient Name: Pam Gamble  DOB: Apr 09, 1946    Initial Comment Caller states that she had a tick on Tuesday. It was itchy, red and it still like that. The tick is in a bag now. It is on her back.    Nurse Assessment  Nurse: Harlow Mares, RN, Suanne Marker Date/Time (Eastern Time): 01/17/2017 12:20:56 PM  Confirm and document reason for call. If symptomatic, describe symptoms. ---Caller states that she had a tick on Tuesday. It was itchy, red and it still like that. The tick is in a bag now. It is on her back. Reports that the tick had been on there for a few days. Red area is circular.  Does the patient have any new or worsening symptoms? ---Yes  Will a triage be completed? ---Yes  Related visit to physician within the last 2 weeks? ---N/A  Does the PT have any chronic conditions? (i.e. diabetes, asthma, etc.) ---Yes  List chronic conditions. ---diabetes; COPD; acid reflux  Is this a behavioral health or substance abuse call? ---No     Guidelines    Guideline Title Affirmed Question Affirmed Notes  Tick Bite Red ring or bull's-eye rash occurs at tick bite    Final Disposition User   See Physician within [place], RN, Suanne Marker    Comments  Scheduled caller for appt with Thersa Salt, DO for tomorrow at 9:30am.   Referrals  [facility]   Disagree/Comply: Comply

## 2017-01-18 ENCOUNTER — Encounter: Payer: Self-pay | Admitting: Family Medicine

## 2017-01-18 ENCOUNTER — Ambulatory Visit (INDEPENDENT_AMBULATORY_CARE_PROVIDER_SITE_OTHER): Payer: Medicare HMO | Admitting: Family Medicine

## 2017-01-18 DIAGNOSIS — W57XXXA Bitten or stung by nonvenomous insect and other nonvenomous arthropods, initial encounter: Secondary | ICD-10-CM

## 2017-01-18 DIAGNOSIS — W57XXXD Bitten or stung by nonvenomous insect and other nonvenomous arthropods, subsequent encounter: Secondary | ICD-10-CM

## 2017-01-18 DIAGNOSIS — Z87828 Personal history of other (healed) physical injury and trauma: Secondary | ICD-10-CM

## 2017-01-18 HISTORY — DX: Bitten or stung by nonvenomous insect and other nonvenomous arthropods, initial encounter: W57.XXXA

## 2017-01-18 NOTE — Progress Notes (Signed)
   Subjective:  Patient ID: Pam Gamble, female    DOB: 11/03/1946  Age: 71 y.o. MRN: [id number]  CC: Tick bite  HPI:  71 year old female presents with complaints of a tick bite.  Patient states that she noted a tick bite on Saturday. She was unable to remove it until Tuesday as it was not in her reach. She was bitten in the midline of her back, in the thoracic region. It was removed without difficulty. She's had no associated fever or chills. She states that she is at her baseline today and feels that she normally does. She states the area does itch. No reports or redness or purulence. No other associated symptoms. No known exacerbating or relieving factors. No other complaints at this time.  Social Hx   Social History   Social History  . Marital status: Single    Spouse name: N/A  . Number of children: 0  . Years of education: N/A   Occupational History  . retired [employer]   Social History Main Topics  . Smoking status: Former Smoker    Packs/day: 2.00    Years: 49.00    Types: Cigarettes    Quit date: 11/11/2005  . Smokeless tobacco: Never Used  . Alcohol use 0.0 oz/week     Comment: beer, whiskey, margherita  . Drug use: No  . Sexual activity: Not Currently   Other Topics Concern  . None   Social History Narrative   Retired - Chief Operating Officer   Lives alone   Single       Review of Systems  Constitutional: Negative.   Skin:       Tick bite.   Objective:  BP 132/76   Pulse 94   Temp 97.6 F (36.4 C) (Oral)   Wt 162 lb 0.6 oz (73.5 kg)   SpO2 96%   BMI 29.64 kg/m   BP/Weight 01/18/2017 12/12/2016 74/94/4967  Systolic BP 591 638 466  Diastolic BP 76 72 70  Wt. (Lbs) 162.04 160.12 155  BMI 29.64 29.29 28.35   Physical Exam  Constitutional: She is oriented to person, place, and time. She appears well-developed. No distress.  Pulmonary/Chest: Effort normal.  Neurological: She is alert and oriented to person, place, and time.  Skin:    Midline thoracic spine - area of tick bite appears normal. No erythema. No fluctuance or purulence.  Psychiatric: She has a normal mood and affect.  Vitals reviewed.  Assessment & Plan:   Problem List Items Addressed This Visit    Tick bite    New problem. No signs or symptoms to suggest underlying tick borne illness. Area appears normal. No indication for antibiotic at this time. I advised topical hydrocortisone as needed for itching         Follow-up: PRN  [place]

## 2017-01-18 NOTE — Progress Notes (Signed)
Pre visit review using our clinic review tool, if applicable. No additional management support is needed unless otherwise documented below in the visit note. 

## 2017-01-18 NOTE — Patient Instructions (Signed)
No need to worry at this time.  The area looks fine and you are well.  Follow up if you develop fever or other symptoms.  Take care  Dr. Lacinda Axon

## 2017-01-18 NOTE — Assessment & Plan Note (Signed)
New problem. No signs or symptoms to suggest underlying tick borne illness. Area appears normal. No indication for antibiotic at this time. I advised topical hydrocortisone as needed for itching

## 2017-01-23 ENCOUNTER — Ambulatory Visit: Payer: Medicare HMO | Admitting: Pulmonary Disease

## 2017-02-04 ENCOUNTER — Telehealth: Payer: Self-pay | Admitting: Internal Medicine

## 2017-02-04 NOTE — Telephone Encounter (Signed)
Routing to dr plotnikov, please advise, I will call patient back

## 2017-02-04 NOTE — Telephone Encounter (Signed)
Pt called stating her mouth is swollen and hasn't worn her dentures since Friday. She is nauseous. Wants to know if she can be prescribed something for this.

## 2017-02-04 NOTE — Telephone Encounter (Signed)
Take Benadryl, Tylenol Needs OV: I don't know what it is Thx

## 2017-02-05 ENCOUNTER — Ambulatory Visit (INDEPENDENT_AMBULATORY_CARE_PROVIDER_SITE_OTHER): Payer: Medicare HMO | Admitting: Internal Medicine

## 2017-02-05 ENCOUNTER — Encounter: Payer: Self-pay | Admitting: Internal Medicine

## 2017-02-05 VITALS — BP 132/80 | HR 89 | Temp 98.0°F | Resp 16 | Ht 62.0 in | Wt 164.0 lb

## 2017-02-05 DIAGNOSIS — J0100 Acute maxillary sinusitis, unspecified: Secondary | ICD-10-CM

## 2017-02-05 MED ORDER — CEFDINIR 300 MG PO CAPS: 300.0000 mg | ORAL_CAPSULE | Freq: Two times a day (BID) | ORAL | 0 refills | Status: AC

## 2017-02-05 NOTE — Progress Notes (Signed)
Subjective:  Patient ID: Pam Gamble, female    DOB: 11-Jul-1946  Age: 71 y.o. MRN: [id number]  CC: Sinusitis   HPI Pam Gamble presents for A 5 day history of right facial pain and runny nose. Pam Gamble tried to treat the symptoms with a decongestant but it made her heart rate go up.  Outpatient Medications Prior to Visit  Medication Sig Dispense Refill  . albuterol (PROVENTIL HFA;VENTOLIN HFA) 108 (90 BASE) MCG/ACT inhaler Inhale 2 puffs into the lungs every 6 (six) hours as needed for wheezing. 1 Inhaler 5  . B Complex-C-Folic Acid (STRESS B COMPLEX PO) Take 1 capsule by mouth daily.    . Blood Glucose Monitoring Suppl (ONE TOUCH ULTRA SYSTEM KIT) w/Device KIT 1 kit by Does not apply route once. 1 each 0  . Cetirizine HCl (ZYRTEC PO) Take 1 tablet by mouth daily.    . cholecalciferol (VITAMIN D) 1000 UNITS tablet Take 5,000 Units by mouth daily.    Marland Kitchen conjugated estrogens (PREMARIN) vaginal cream Place 1 Applicatorful vaginally as needed. Reported on 03/13/2016    . fluticasone (FLONASE) 50 MCG/ACT nasal spray Place 2 sprays into the nose daily as needed for rhinitis. Reported on 01/23/2016    . furosemide (LASIX) 20 MG tablet Take 20-40 mg by mouth as needed. Reported on 03/13/2016    . GINKGO BILOBA COMPLEX PO Take 1 capsule by mouth daily.    Marland Kitchen glucose blood test strip 1 each by Other route 2 (two) times daily. DX: E11.9 100 each 12  . insulin detemir (LEVEMIR) 100 UNIT/ML injection Inject 20 Units into the skin at bedtime.    . insulin NPH Human (HUMULIN N) 100 UNIT/ML injection Start with 24 units daily. Titrate up by 1 unit a day for goal sugars of 100-130 up to 40 units a day 10 mL 11  . Insulin Syringe-Needle U-100 (B-D INS SYRINGE 0.5CC/31GX5/16) 31G X 5/16" 0.5 ML MISC Use qd 100 each 3  . ipratropium-albuterol (DUONEB) 0.5-2.5 (3) MG/3ML SOLN as needed.    . metFORMIN (GLUCOPHAGE) 1000 MG tablet TAKE ONE TABLET BY MOUTH TWICE DAILY WITH MEALS 180 tablet 3  . Na Sulfate-K Sulfate-Mg  Sulf SOLN Take 1 kit by mouth once. 354 mL 0  . omeprazole (PRILOSEC) 40 MG capsule Take 1 capsule (40 mg total) by mouth daily. 90 capsule 3  . ONE TOUCH LANCETS MISC 1 each by Does not apply route 2 (two) times daily. 100 each 3  . traMADol (ULTRAM) 50 MG tablet Take 1-2 tablets (50-100 mg total) by mouth 2 (two) times daily as needed. 40 tablet 0  . umeclidinium-vilanterol (ANORO ELLIPTA) 62.5-25 MCG/INH AEPB Inhale 1 puff into the lungs daily. 1 each 3  . VIGAMOX 0.5 % ophthalmic solution Reported on 03/13/2016    . benzonatate (TESSALON) 100 MG capsule Take 1 capsule (100 mg total) by mouth 3 (three) times daily as needed for cough. 20 capsule 0  . Fluticasone-Salmeterol (ADVAIR DISKUS) 500-50 MCG/DOSE AEPB Inhale 1 puff into the lungs 2 (two) times daily. 60 each 6  . guaiFENesin (MUCINEX) 600 MG 12 hr tablet Take 1 tablet (600 mg total) by mouth 2 (two) times daily as needed for cough or to loosen phlegm. 14 tablet 0  . Insulin Glargine (TOUJEO SOLOSTAR) 300 UNIT/ML SOPN Inject 24 Units into the skin every morning. Titrate up by 1 unit a day for goal sugars of 100-130 up to 40 units a day 1 pen 11  . ondansetron (ZOFRAN)  4 MG tablet Take 1 tablet (4 mg total) by mouth every 8 (eight) hours as needed for nausea or vomiting. 30 tablet 0  . predniSONE (DELTASONE) 10 MG tablet 4tab by mouth x 3day,3tab x 3days,2tab x 3 days, 1tab x 3 days 30 tablet 0  . promethazine (PHENERGAN) 12.5 MG tablet Take 1 tablet (12.5 mg total) by mouth every 8 (eight) hours as needed for nausea or vomiting. 20 tablet 0  . azithromycin (ZITHROMAX) 250 MG tablet As directed (Patient not taking: Reported on 02/05/2017) 6 tablet 0   Facility-Administered Medications Prior to Visit  Medication Dose Route Frequency Provider Last Rate Last Dose  . ipratropium-albuterol (DUONEB) 0.5-2.5 (3) MG/3ML nebulizer solution 3 mL  3 mL Nebulization Once Flossie Buffy, NP        ROS Review of Systems  Constitutional: Negative  for appetite change, chills, fatigue and fever.  HENT: Positive for congestion, ear pain, postnasal drip, rhinorrhea, sinus pain and sinus pressure. Negative for dental problem, facial swelling, sneezing, sore throat, trouble swallowing and voice change.   Eyes: Negative.  Negative for visual disturbance.  Respiratory: Negative for cough, chest tightness, shortness of breath and wheezing.   Cardiovascular: Negative for chest pain, palpitations and leg swelling.  Gastrointestinal: Negative for abdominal pain, constipation, diarrhea, nausea and vomiting.  Endocrine: Negative.   Genitourinary: Negative.   Musculoskeletal: Negative.  Negative for back pain and neck pain.  Skin: Negative.  Negative for color change and rash.  Allergic/Immunologic: Negative.   Neurological: Negative.  Negative for dizziness, weakness and headaches.  Hematological: Negative for adenopathy. Does not bruise/bleed easily.  Psychiatric/Behavioral: Negative.     Objective:  BP 132/80 (BP Location: Left Arm, Patient Position: Sitting, Cuff Size: Normal)   Pulse 89   Temp 98 F (36.7 C) (Oral)   Resp 16   Ht _0  (1.575 m)   Wt 164 lb (74.4 kg)   SpO2 96%   BMI 30.00 kg/m   BP Readings from Last 3 Encounters:  02/05/17 132/80  01/18/17 132/76  12/12/16 (!) 152/72    Wt Readings from Last 3 Encounters:  02/05/17 164 lb (74.4 kg)  01/18/17 162 lb 0.6 oz (73.5 kg)  12/12/16 160 lb 1.9 oz (72.6 kg)    Physical Exam  Constitutional: Pam Gamble is oriented to person, place, and time.  Non-toxic appearance. Pam Gamble does not have a sickly appearance. Pam Gamble does not appear ill. No distress.  HENT:  Right Ear: Hearing, tympanic membrane, external ear and ear canal normal.  Left Ear: Hearing, tympanic membrane, external ear and ear canal normal.  Nose: No mucosal edema, rhinorrhea, sinus tenderness or nasal deformity. Right sinus exhibits maxillary sinus tenderness. Right sinus exhibits no frontal sinus tenderness. Left  sinus exhibits no maxillary sinus tenderness and no frontal sinus tenderness.  Mouth/Throat: Oropharynx is clear and moist and mucous membranes are normal. Mucous membranes are not pale, not dry and not cyanotic. No oral lesions. No trismus in the jaw. No uvula swelling. No oropharyngeal exudate, posterior oropharyngeal edema, posterior oropharyngeal erythema or tonsillar abscesses.  Eyes: Conjunctivae are normal. Right eye exhibits no discharge. Left eye exhibits no discharge. No scleral icterus.  Neck: Normal range of motion. Neck supple. No JVD present. No tracheal deviation present. No thyromegaly present.  Cardiovascular: Normal rate, regular rhythm, normal heart sounds and intact distal pulses.  Exam reveals no gallop and no friction rub.   No murmur heard. Pulmonary/Chest: Effort normal and breath sounds normal. No stridor. No respiratory  distress. Pam Gamble has no wheezes. Pam Gamble has no rales. Pam Gamble exhibits no tenderness.  Abdominal: Soft. Bowel sounds are normal. Pam Gamble exhibits no distension and no mass. There is no tenderness. There is no rebound and no guarding.  Musculoskeletal: Normal range of motion. Pam Gamble exhibits no edema or tenderness.  Lymphadenopathy:    Pam Gamble has no cervical adenopathy.  Neurological: Pam Gamble is alert and oriented to person, place, and time. Pam Gamble has normal reflexes.  Skin: Skin is warm and dry. No rash noted. Pam Gamble is not diaphoretic. No erythema. No pallor.  Vitals reviewed.   Lab Results  Component Value Date   WBC 16.7 (H) 12/12/2016   HGB 12.1 12/12/2016   HCT 38.3 12/12/2016   PLT 439.0 (H) 12/12/2016   GLUCOSE 243 (H) 12/12/2016   CHOL 228 (H) 12/12/2016   TRIG 114.0 12/12/2016   HDL 52.60 12/12/2016   LDLDIRECT 160.1 07/12/2014   LDLCALC 153 (H) 12/12/2016   ALT 38 (H) 07/12/2014   AST 45 (H) 07/12/2014   NA 136 12/12/2016   K 4.3 12/12/2016   CL 100 12/12/2016   CREATININE 0.88 12/12/2016   BUN 18 12/12/2016   CO2 29 12/12/2016   TSH 1.33 12/12/2016   PSA  0.02 (L) 01/01/2008   HGBA1C 11.4 Repeated and verified X2. (H) 12/12/2016   MICROALBUR 6.4 (H) 12/12/2016    Dg Chest 2 View  Result Date: 09/25/2016 CLINICAL DATA:  Cough, fever, shortness of breath. Chills. Obstructive chronic bronchitis with exacerbation. EXAM: CHEST  2 VIEW COMPARISON:  12/21/2013 FINDINGS: The cardiomediastinal contours are unchanged, heart at the upper limits normal in size. There is biapical pleural parenchymal scarring. Pulmonary vasculature is normal. No consolidation, pleural effusion, or pneumothorax. No acute osseous abnormalities are seen. IMPRESSION: No acute pulmonary process. Electronically Signed   By: Jeb Levering M.D.   On: 09/25/2016 17:28    Assessment & Plan:   Marrisa was seen today for sinusitis.  Diagnoses and all orders for this visit:  Acute non-recurrent maxillary sinusitis -     cefdinir (OMNICEF) 300 MG capsule; Take 1 capsule (300 mg total) by mouth 2 (two) times daily.   I have discontinued Pam Gamble's Fluticasone-Salmeterol, Insulin Glargine, benzonatate, guaiFENesin, promethazine, ondansetron, azithromycin, and predniSONE. I am also having her start on cefdinir. Additionally, I am having her maintain her cholecalciferol, B Complex-C-Folic Acid (STRESS B COMPLEX PO), GINKGO BILOBA COMPLEX PO, Cetirizine HCl (ZYRTEC PO), fluticasone, traMADol, albuterol, omeprazole, ipratropium-albuterol, VIGAMOX, glucose blood, ONE TOUCH ULTRA SYSTEM KIT, ONE TOUCH LANCETS, conjugated estrogens, furosemide, Na Sulfate-K Sulfate-Mg Sulf, metFORMIN, insulin NPH Human, Insulin Syringe-Needle U-100, insulin detemir, and umeclidinium-vilanterol. We will continue to administer ipratropium-albuterol.  Meds ordered this encounter  Medications  . cefdinir (OMNICEF) 300 MG capsule    Sig: Take 1 capsule (300 mg total) by mouth 2 (two) times daily.    Dispense:  20 capsule    Refill:  0     Follow-up: Return in about 3 weeks (around 02/26/2017).  Scarlette Calico, MD

## 2017-02-05 NOTE — Patient Instructions (Signed)

## 2017-02-05 NOTE — Telephone Encounter (Signed)
Patient has already seen dr Ronnald Ramp this morning, she is feeling better so far

## 2017-02-05 NOTE — Progress Notes (Signed)
Pre visit review using our clinic review tool, if applicable. No additional management support is needed unless otherwise documented below in the visit note. 

## 2017-03-13 ENCOUNTER — Other Ambulatory Visit (INDEPENDENT_AMBULATORY_CARE_PROVIDER_SITE_OTHER): Payer: Medicare HMO

## 2017-03-13 ENCOUNTER — Encounter: Payer: Self-pay | Admitting: Internal Medicine

## 2017-03-13 ENCOUNTER — Ambulatory Visit (INDEPENDENT_AMBULATORY_CARE_PROVIDER_SITE_OTHER): Payer: Medicare HMO | Admitting: Internal Medicine

## 2017-03-13 DIAGNOSIS — E1100 Type 2 diabetes mellitus with hyperosmolarity without nonketotic hyperglycemic-hyperosmolar coma (NKHHC): Secondary | ICD-10-CM | POA: Diagnosis not present

## 2017-03-13 DIAGNOSIS — J441 Chronic obstructive pulmonary disease with (acute) exacerbation: Secondary | ICD-10-CM | POA: Diagnosis not present

## 2017-03-13 DIAGNOSIS — K219 Gastro-esophageal reflux disease without esophagitis: Secondary | ICD-10-CM

## 2017-03-13 DIAGNOSIS — Z794 Long term (current) use of insulin: Secondary | ICD-10-CM

## 2017-03-13 DIAGNOSIS — R600 Localized edema: Secondary | ICD-10-CM | POA: Diagnosis not present

## 2017-03-13 DIAGNOSIS — R69 Illness, unspecified: Secondary | ICD-10-CM | POA: Diagnosis not present

## 2017-03-13 LAB — HEMOGLOBIN A1C: Hgb A1c MFr Bld: 12.6 % — ABNORMAL HIGH (ref 4.6–6.5)

## 2017-03-13 LAB — BASIC METABOLIC PANEL
BUN: 17 mg/dL (ref 6–23)
CALCIUM: 10.4 mg/dL (ref 8.4–10.5)
CO2: 32 meq/L (ref 19–32)
Chloride: 93 mEq/L — ABNORMAL LOW (ref 96–112)
Creatinine, Ser: 0.97 mg/dL (ref 0.40–1.20)
GFR: 60.2 mL/min (ref >60.00)
Glucose, Bld: 466 mg/dL — ABNORMAL HIGH (ref 70–99)
POTASSIUM: 4.9 meq/L (ref 3.5–5.1)
SODIUM: 135 meq/L (ref 135–145)

## 2017-03-13 MED ORDER — FLUCONAZOLE 150 MG PO TABS: 150.0000 mg | ORAL_TABLET | Freq: Once | ORAL | 1 refills | Status: DC

## 2017-03-13 MED ORDER — LORATADINE 10 MG PO TABS: 10.0000 mg | ORAL_TABLET | Freq: Every day | ORAL | 3 refills | Status: DC

## 2017-03-13 MED ORDER — INSULIN NPH (HUMAN) (ISOPHANE) 100 UNIT/ML ~~LOC~~ SUSP: SUBCUTANEOUS | 11 refills | Status: DC

## 2017-03-13 MED ORDER — IPRATROPIUM-ALBUTEROL 0.5-2.5 (3) MG/3ML IN SOLN: 3.0000 mL | RESPIRATORY_TRACT | 1 refills | Status: DC | PRN

## 2017-03-13 NOTE — Progress Notes (Signed)
Subjective:  Patient ID: Pam Gamble, female    DOB: 1946-05-05  Age: 71 y.o. MRN: [id number]  CC: No chief complaint on file.   HPI ABIR EROH presents for DM, COPD, GERD f/u CBGs are high... Using no more than 40 u NPH a day. No low CBGs  Outpatient Medications Prior to Visit  Medication Sig Dispense Refill  . albuterol (PROVENTIL HFA;VENTOLIN HFA) 108 (90 BASE) MCG/ACT inhaler Inhale 2 puffs into the lungs every 6 (six) hours as needed for wheezing. 1 Inhaler 5  . B Complex-C-Folic Acid (STRESS B COMPLEX PO) Take 1 capsule by mouth daily.    . Blood Glucose Monitoring Suppl (ONE TOUCH ULTRA SYSTEM KIT) w/Device KIT 1 kit by Does not apply route once. 1 each 0  . Cetirizine HCl (ZYRTEC PO) Take 1 tablet by mouth daily.    . cholecalciferol (VITAMIN D) 1000 UNITS tablet Take 5,000 Units by mouth daily.    . fluticasone (FLONASE) 50 MCG/ACT nasal spray Place 2 sprays into the nose daily as needed for rhinitis. Reported on 01/23/2016    . GINKGO BILOBA COMPLEX PO Take 1 capsule by mouth daily.    Marland Kitchen glucose blood test strip 1 each by Other route 2 (two) times daily. DX: E11.9 100 each 12  . insulin NPH Human (HUMULIN N) 100 UNIT/ML injection Start with 24 units daily. Titrate up by 1 unit a day for goal sugars of 100-130 up to 40 units a day 10 mL 11  . Insulin Syringe-Needle U-100 (B-D INS SYRINGE 0.5CC/31GX5/16) 31G X 5/16" 0.5 ML MISC Use qd 100 each 3  . ipratropium-albuterol (DUONEB) 0.5-2.5 (3) MG/3ML SOLN as needed.    . metFORMIN (GLUCOPHAGE) 1000 MG tablet TAKE ONE TABLET BY MOUTH TWICE DAILY WITH MEALS 180 tablet 3  . omeprazole (PRILOSEC) 40 MG capsule Take 1 capsule (40 mg total) by mouth daily. 90 capsule 3  . ONE TOUCH LANCETS MISC 1 each by Does not apply route 2 (two) times daily. 100 each 3  . conjugated estrogens (PREMARIN) vaginal cream Place 1 Applicatorful vaginally as needed. Reported on 03/13/2016    . furosemide (LASIX) 20 MG tablet Take 20-40 mg by mouth as  needed. Reported on 03/13/2016    . insulin detemir (LEVEMIR) 100 UNIT/ML injection Inject 20 Units into the skin at bedtime.    . Na Sulfate-K Sulfate-Mg Sulf SOLN Take 1 kit by mouth once. 354 mL 0  . traMADol (ULTRAM) 50 MG tablet Take 1-2 tablets (50-100 mg total) by mouth 2 (two) times daily as needed. 40 tablet 0  . umeclidinium-vilanterol (ANORO ELLIPTA) 62.5-25 MCG/INH AEPB Inhale 1 puff into the lungs daily. 1 each 3  . VIGAMOX 0.5 % ophthalmic solution Reported on 03/13/2016     Facility-Administered Medications Prior to Visit  Medication Dose Route Frequency Provider Last Rate Last Dose  . ipratropium-albuterol (DUONEB) 0.5-2.5 (3) MG/3ML nebulizer solution 3 mL  3 mL Nebulization Once Flossie Buffy, NP        ROS Review of Systems  Constitutional: Positive for fatigue. Negative for activity change, appetite change, chills and unexpected weight change.  HENT: Positive for congestion. Negative for mouth sores and sinus pressure.   Eyes: Negative for visual disturbance.  Respiratory: Positive for shortness of breath. Negative for cough and chest tightness.   Gastrointestinal: Negative for abdominal pain and nausea.  Genitourinary: Negative for difficulty urinating, frequency and vaginal pain.  Musculoskeletal: Negative for back pain and gait problem.  Skin:  Negative for pallor and rash.  Neurological: Negative for dizziness, tremors, weakness, numbness and headaches.  Psychiatric/Behavioral: Negative for confusion and sleep disturbance.    Objective:  BP 126/76 (BP Location: Left Arm, Patient Position: Sitting, Cuff Size: Normal)   Pulse (!) 101   Temp 97.7 F (36.5 C) (Oral)   Ht '5\' 2"'  (1.575 m)   Wt 156 lb 1.9 oz (70.8 kg)   SpO2 95%   BMI 28.55 kg/m   BP Readings from Last 3 Encounters:  03/13/17 126/76  02/05/17 132/80  01/18/17 132/76    Wt Readings from Last 3 Encounters:  03/13/17 156 lb 1.9 oz (70.8 kg)  02/05/17 164 lb (74.4 kg)  01/18/17 162 lb 0.6 oz  (73.5 kg)    Physical Exam  Constitutional: She appears well-developed. No distress.  HENT:  Head: Normocephalic.  Right Ear: External ear normal.  Left Ear: External ear normal.  Nose: Nose normal.  Mouth/Throat: Oropharynx is clear and moist.  Eyes: Conjunctivae are normal. Pupils are equal, round, and reactive to light. Right eye exhibits no discharge. Left eye exhibits no discharge.  Neck: Normal range of motion. Neck supple. No JVD present. No tracheal deviation present. No thyromegaly present.  Cardiovascular: Normal rate, regular rhythm and normal heart sounds.   Pulmonary/Chest: No stridor. No respiratory distress. She has wheezes.  Abdominal: Soft. Bowel sounds are normal. She exhibits no distension and no mass. There is no tenderness. There is no rebound and no guarding.  Musculoskeletal: She exhibits no edema or tenderness.  Lymphadenopathy:    She has no cervical adenopathy.  Neurological: She displays normal reflexes. No cranial nerve deficit. She exhibits normal muscle tone. Coordination normal.  Skin: No rash noted. No erythema.  Psychiatric: She has a normal mood and affect. Her behavior is normal. Judgment and thought content normal.  Obese  Lab Results  Component Value Date   WBC 16.7 (H) 12/12/2016   HGB 12.1 12/12/2016   HCT 38.3 12/12/2016   PLT 439.0 (H) 12/12/2016   GLUCOSE 243 (H) 12/12/2016   CHOL 228 (H) 12/12/2016   TRIG 114.0 12/12/2016   HDL 52.60 12/12/2016   LDLDIRECT 160.1 07/12/2014   LDLCALC 153 (H) 12/12/2016   ALT 38 (H) 07/12/2014   AST 45 (H) 07/12/2014   NA 136 12/12/2016   K 4.3 12/12/2016   CL 100 12/12/2016   CREATININE 0.88 12/12/2016   BUN 18 12/12/2016   CO2 29 12/12/2016   TSH 1.33 12/12/2016   PSA 0.02 (L) 01/01/2008   HGBA1C 11.4 Repeated and verified X2. (H) 12/12/2016   MICROALBUR 6.4 (H) 12/12/2016    Dg Chest 2 View  Result Date: 09/25/2016 CLINICAL DATA:  Cough, fever, shortness of breath. Chills. Obstructive  chronic bronchitis with exacerbation. EXAM: CHEST  2 VIEW COMPARISON:  12/21/2013 FINDINGS: The cardiomediastinal contours are unchanged, heart at the upper limits normal in size. There is biapical pleural parenchymal scarring. Pulmonary vasculature is normal. No consolidation, pleural effusion, or pneumothorax. No acute osseous abnormalities are seen. IMPRESSION: No acute pulmonary process. Electronically Signed   By: Jeb Levering M.D.   On: 09/25/2016 17:28    Assessment & Plan:   There are no diagnoses linked to this encounter. I have discontinued Ms. Marro's traMADol, VIGAMOX, conjugated estrogens, furosemide, Na Sulfate-K Sulfate-Mg Sulf, insulin detemir, and umeclidinium-vilanterol. I am also having her maintain her cholecalciferol, B Complex-C-Folic Acid (STRESS B COMPLEX PO), GINKGO BILOBA COMPLEX PO, Cetirizine HCl (ZYRTEC PO), fluticasone, albuterol, omeprazole, ipratropium-albuterol, glucose blood, ONE  TOUCH ULTRA SYSTEM KIT, ONE TOUCH LANCETS, metFORMIN, insulin NPH Human, and Insulin Syringe-Needle U-100. We will continue to administer ipratropium-albuterol.  No orders of the defined types were placed in this encounter.    Follow-up: No Follow-up on file.  Walker Kehr, MD

## 2017-03-13 NOTE — Patient Instructions (Signed)
[facility] Well w/Jill 

## 2017-03-13 NOTE — Assessment & Plan Note (Signed)
Not on Rx 

## 2017-03-13 NOTE — Assessment & Plan Note (Signed)
On HHN Trelegy samples qd

## 2017-03-13 NOTE — Progress Notes (Signed)
Pre visit review using our clinic review tool, if applicable. No additional management support is needed unless otherwise documented below in the visit note. 

## 2017-03-13 NOTE — Assessment & Plan Note (Signed)
Loratidine

## 2017-03-13 NOTE — Assessment & Plan Note (Addendum)
On NPH, metformin - worse   Use NPH insulin 40 u in am and 10 u at night. Titrate up by 1 unit a day each (AM and HS doses) for goal sugars of 100-130 up to 60 units in AM and 20 u at night. Sincerel

## 2017-03-20 ENCOUNTER — Other Ambulatory Visit: Payer: Self-pay | Admitting: Internal Medicine

## 2017-03-20 DIAGNOSIS — R69 Illness, unspecified: Secondary | ICD-10-CM | POA: Diagnosis not present

## 2017-04-25 ENCOUNTER — Encounter: Payer: Self-pay | Admitting: Internal Medicine

## 2017-04-25 ENCOUNTER — Ambulatory Visit (INDEPENDENT_AMBULATORY_CARE_PROVIDER_SITE_OTHER): Payer: Medicare HMO | Admitting: Internal Medicine

## 2017-04-25 VITALS — BP 124/76 | HR 71 | Ht 62.0 in | Wt 167.0 lb

## 2017-04-25 DIAGNOSIS — M545 Low back pain, unspecified: Secondary | ICD-10-CM

## 2017-04-25 HISTORY — DX: Low back pain, unspecified: M54.50

## 2017-04-25 MED ORDER — TRAMADOL HCL 50 MG PO TABS: 50.0000 mg | ORAL_TABLET | Freq: Three times a day (TID) | ORAL | 0 refills | Status: DC | PRN

## 2017-04-25 MED ORDER — TIZANIDINE HCL 4 MG PO TABS: 4.0000 mg | ORAL_TABLET | Freq: Four times a day (QID) | ORAL | 0 refills | Status: DC | PRN

## 2017-04-25 NOTE — Progress Notes (Signed)
Subjective:    Patient ID: Pam Gamble, female    DOB: 03-11-1946, 71 y.o.   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  Denies urinary symptoms such as dysuria, frequency, urgency, flank pain, hematuria or n/v, fever, chills.  Pt denies bowel or bladder change, fever, wt loss,  worsening LE pain/numbness/weakness, gait change or falls.  Denies worsening reflux, abd pain, dysphagia, n/v, bowel change or blood. Past Medical History:  Diagnosis Date  . Barrett's esophagus   . Calcium deposits in tendon and bursa   . Chronic airway obstruction, not elsewhere classified   . COPD (chronic obstructive pulmonary disease) ([place])   . Depression   . Diabetes mellitus 2008ish   type two  . Esophageal reflux   . Idiopathic thrombocythemia   . Kidney stones   . Lateral epicondylitis  of elbow   . Personal history of malignant neoplasm of other parts of uterus   . Personal history of other diseases of digestive system   . Shortness of breath    upon exertion   Past Surgical History:  Procedure Laterality Date  . ABDOMINAL HYSTERECTOMY    . CATARACT EXTRACTION Bilateral 01/2016 and 03/2016  . LITHOTRIPSY    . REFRACTIVE SURGERY Right 05/16/2016  . SPLENECTOMY  1999  . TONSILLECTOMY AND ADENOIDECTOMY      reports that she quit smoking about 11 years ago. Her smoking use included Cigarettes. She has a 98.00 pack-year smoking history. She has never used smokeless tobacco. She reports that she drinks alcohol. She reports that she does not use drugs. family history includes Diabetes in her father, maternal aunt, and sister; Heart disease in her father and mother; Liver cancer in her sister; Pancreatic cancer in her sister; Stroke in her mother. Allergies  Allergen Reactions  . Amoxicillin-Pot Clavulanate Nausea  Only and Other (See Comments)    REACTION: diarrhea "sick to stomach"  . Levaquin [Levofloxacin] Nausea Only   Current Outpatient Prescriptions on File Prior to Visit  Medication Sig Dispense Refill  . albuterol (PROVENTIL HFA;VENTOLIN HFA) 108 (90 BASE) MCG/ACT inhaler Inhale 2 puffs into the lungs every 6 (six) hours as needed for wheezing. 1 Inhaler 5  . B Complex-C-Folic Acid (STRESS B COMPLEX PO) Take 1 capsule by mouth daily.    . Blood Glucose Monitoring Suppl (ONE TOUCH ULTRA SYSTEM KIT) w/Device KIT 1 kit by Does not apply route once. 1 each 0  . Cetirizine HCl (ZYRTEC PO) Take 1 tablet by mouth daily.    . cholecalciferol (VITAMIN D) 1000 UNITS tablet Take 5,000 Units by mouth daily.    . fluticasone (FLONASE) 50 MCG/ACT nasal spray Place 2 sprays into the nose daily as needed for rhinitis. Reported on 01/23/2016    . GINKGO BILOBA COMPLEX PO Take 1 capsule by mouth daily.    Marland Kitchen glucose blood (ONE TOUCH ULTRA TEST) test strip Use to check blood sugars twice a day 200 each 1  . insulin NPH Human (HUMULIN N) 100 UNIT/ML injection Use 40 u in am and 10 u qhs. Titrate up by 1 unit a day each (AM and HS doses) for goal sugars of 100-130 up to 60 units in AM and 20 u at HS 10 mL 11  . Insulin Syringe-Needle U-100 (B-D INS SYRINGE 0.5CC/31GX5/16) 31G X  5/16" 0.5 ML MISC Use qd 100 each 3  . ipratropium-albuterol (DUONEB) 0.5-2.5 (3) MG/3ML SOLN Inhale 3 mLs into the lungs as needed. 360 mL 1  . loratadine (CLARITIN) 10 MG tablet Take 1 tablet (10 mg total) by mouth daily. 100 tablet 3  . metFORMIN (GLUCOPHAGE) 1000 MG tablet TAKE ONE TABLET BY MOUTH TWICE DAILY WITH MEALS 180 tablet 3  . omeprazole (PRILOSEC) 40 MG capsule Take 1 capsule (40 mg total) by mouth daily. 90 capsule 3  . ONE TOUCH LANCETS MISC 1 each by Does not apply route 2 (two) times daily. 100 each 3   Current Facility-Administered Medications on File Prior to Visit  Medication Dose Route Frequency Provider Last Rate Last  Dose  . ipratropium-albuterol (DUONEB) 0.5-2.5 (3) MG/3ML nebulizer solution 3 mL  3 mL Nebulization Once Nche, Charlene Brooke, NP       Review of Systems  Constitutional: Negative for other unusual diaphoresis or sweats HENT: Negative for ear discharge or swelling Eyes: Negative for other worsening visual disturbances Respiratory: Negative for stridor or other swelling  Gastrointestinal: Negative for worsening distension or other blood Genitourinary: Negative for retention or other urinary change Musculoskeletal: Negative for other MSK pain or swelling Skin: Negative for color change or other new lesions Neurological: Negative for worsening tremors and other numbness  Psychiatric/Behavioral: Negative for worsening agitation or other fatigue All other system neg per pt    Objective:   Physical Exam BP 124/76   Pulse 71   Ht '5\' 2"'  (1.575 m)   Wt 167 lb (75.8 kg)   SpO2 99%   BMI 30.54 kg/m  VS noted,  Constitutional: Pt appears in NAD HENT: Head: NCAT.  Right Ear: External ear normal.  Left Ear: External ear normal.  Eyes: . Pupils are equal, round, and reactive to light. Conjunctivae and EOM are normal Nose: without d/c or deformity Neck: Neck supple. Gross normal ROM Cardiovascular: Normal rate and regular rhythm.   Pulmonary/Chest: Effort normal and breath sounds without rales or wheezing.  Abd:  Soft, NT, ND, + BS, no organomegaly Spine nontender; has bilat lumbar paravertebral tender spasm Neurological: Pt is alert. At baseline orientation, motor grossly intact Skin: Skin is warm. No rashes, other new lesions, no LE edema Psychiatric: Pt behavior is normal without agitation  No other exam findings       Assessment & Plan:

## 2017-04-25 NOTE — Patient Instructions (Signed)
Please take all new medication as prescribed - the pain medication, and the muscle relaxer  Please continue all other medications as before, and refills have been done if requested.  Please have the pharmacy call with any other refills you may need.  Please keep your appointments with your specialists as you may have planned

## 2017-04-26 NOTE — Assessment & Plan Note (Addendum)
Mild to mod, c/w msk spasm, no neuro changes, for pain control and muscle relaxer prn,  to f/u any worsening symptoms or concerns

## 2017-05-01 ENCOUNTER — Ambulatory Visit (INDEPENDENT_AMBULATORY_CARE_PROVIDER_SITE_OTHER)
Admission: RE | Admit: 2017-05-01 | Discharge: 2017-05-01 | Disposition: A | Discharge status: Home / Self Care | Payer: Medicare HMO | Source: Ambulatory Visit | Attending: Internal Medicine | Admitting: Internal Medicine

## 2017-05-01 ENCOUNTER — Telehealth: Payer: Self-pay | Admitting: Internal Medicine

## 2017-05-01 DIAGNOSIS — M545 Low back pain, unspecified: Secondary | ICD-10-CM

## 2017-05-01 NOTE — Telephone Encounter (Signed)
Dr John please advise.  

## 2017-05-01 NOTE — Telephone Encounter (Signed)
Ok for LS spine xrays to make sure of any bony issues with lower back

## 2017-05-01 NOTE — Telephone Encounter (Signed)
Pt called stating her back is not any better from her visit last Friday , as ,long as she is sitting and laying down she is fine but as long as she moves or stands up it hurts again   She would like a call back

## 2017-05-01 NOTE — Telephone Encounter (Signed)
Pt has been informed and will go to the lab today to have them done.

## 2017-06-06 DIAGNOSIS — R69 Illness, unspecified: Secondary | ICD-10-CM | POA: Diagnosis not present

## 2017-06-13 ENCOUNTER — Encounter: Payer: Self-pay | Admitting: Internal Medicine

## 2017-06-13 ENCOUNTER — Ambulatory Visit (INDEPENDENT_AMBULATORY_CARE_PROVIDER_SITE_OTHER): Payer: Medicare HMO | Admitting: Internal Medicine

## 2017-06-13 ENCOUNTER — Other Ambulatory Visit (INDEPENDENT_AMBULATORY_CARE_PROVIDER_SITE_OTHER): Payer: Medicare HMO

## 2017-06-13 VITALS — BP 130/76 | HR 84 | Temp 98.3°F | Ht 62.0 in | Wt 163.0 lb

## 2017-06-13 DIAGNOSIS — Z9081 Acquired absence of spleen: Secondary | ICD-10-CM

## 2017-06-13 DIAGNOSIS — Z794 Long term (current) use of insulin: Secondary | ICD-10-CM

## 2017-06-13 DIAGNOSIS — E1100 Type 2 diabetes mellitus with hyperosmolarity without nonketotic hyperglycemic-hyperosmolar coma (NKHHC): Secondary | ICD-10-CM

## 2017-06-13 DIAGNOSIS — J441 Chronic obstructive pulmonary disease with (acute) exacerbation: Secondary | ICD-10-CM

## 2017-06-13 DIAGNOSIS — Z0000 Encounter for general adult medical examination without abnormal findings: Secondary | ICD-10-CM

## 2017-06-13 DIAGNOSIS — M545 Low back pain: Secondary | ICD-10-CM

## 2017-06-13 LAB — BASIC METABOLIC PANEL
BUN: 27 mg/dL — ABNORMAL HIGH (ref 6–23)
CHLORIDE: 103 meq/L (ref 96–112)
CO2: 28 mEq/L (ref 19–32)
CREATININE: 1.02 mg/dL (ref 0.40–1.20)
Calcium: 9.6 mg/dL (ref 8.4–10.5)
GFR: 56.77 mL/min — ABNORMAL LOW (ref >60.00)
GLUCOSE: 141 mg/dL — AB (ref 70–99)
POTASSIUM: 3.9 meq/L (ref 3.5–5.1)
Sodium: 140 mEq/L (ref 135–145)

## 2017-06-13 LAB — HEMOGLOBIN A1C: Hgb A1c MFr Bld: 11.1 % — ABNORMAL HIGH (ref 4.6–6.5)

## 2017-06-13 MED ORDER — PREDNISONE 20 MG PO TABS: 40.0000 mg | ORAL_TABLET | Freq: Every day | ORAL | 0 refills | Status: AC

## 2017-06-13 MED ORDER — LEVOFLOXACIN 500 MG PO TABS: 500.0000 mg | ORAL_TABLET | Freq: Every day | ORAL | 0 refills | Status: DC

## 2017-06-13 NOTE — Patient Instructions (Addendum)
Continue doing brain stimulating activities (puzzles, reading, adult coloring books, staying active) to keep memory sharp.   Start to eat heart healthy diet (full of fruits, vegetables, whole grains, lean protein, water--limit salt, fat, and sugar intake) and increase physical activity as tolerated.   Pam Gamble , Thank you for taking time to come for your Medicare Wellness Visit. I appreciate your ongoing commitment to your health goals. Please review the following plan we discussed and let me know if I can assist you in the future.   These are the goals we discussed: Goals      Patient Stated   . patient (pt-stated)          Stay healthy enough to go hunting and fishing;  Staying out of cold weather or appropriate garments; To wear mask; can try kerchief;   Keep doctors apt      Other   . Stay as healthy and as independent as possible          Continue to garden, hunt, and fish       This is a list of the screening recommended for you and due dates:  Health Maintenance  Topic Date Due  . Complete foot exam   05/15/1956  . Mammogram  02/23/2010  . Colon Cancer Screening  10/14/2011  . Eye exam for diabetics  11/14/2016  . Flu Shot  06/11/2017  . Hemoglobin A1C  09/13/2017  . Urine Protein Check  12/12/2017  . Tetanus Vaccine  01/29/2023  . DEXA scan (bone density measurement)  Completed  .  Hepatitis C: One time screening is recommended by Center for Disease Control  (CDC) for  adults born from [birth year] through 1965.   Completed  . Pneumonia vaccines  Completed     Insomnia Insomnia is a sleep disorder that makes it difficult to fall asleep or to stay asleep. Insomnia can cause tiredness (fatigue), low energy, difficulty concentrating, mood swings, and poor performance at work or school. There are three different ways to classify insomnia:  Difficulty falling asleep.  Difficulty staying asleep.  Waking up too early in the morning.  Any type of insomnia can be  long-term (chronic) or short-term (acute). Both are common. Short-term insomnia usually lasts for three months or less. Chronic insomnia occurs at least three times a week for longer than three months. What are the causes? Insomnia may be caused by another condition, situation, or substance, such as:  Anxiety.  Certain medicines.  Gastroesophageal reflux disease (GERD) or other gastrointestinal conditions.  Asthma or other breathing conditions.  Restless legs syndrome, sleep apnea, or other sleep disorders.  Chronic pain.  Menopause. This may include hot flashes.  Stroke.  Abuse of alcohol, tobacco, or illegal drugs.  Depression.  Caffeine.  Neurological disorders, such as Alzheimer disease.  An overactive thyroid (hyperthyroidism).  The cause of insomnia may not be known. What increases the risk? Risk factors for insomnia include:  Gender. Women are more commonly affected than men.  Age. Insomnia is more common as you get older.  Stress. This may involve your professional or personal life.  Income. Insomnia is more common in people with lower income.  Lack of exercise.  Irregular work schedule or night shifts.  Traveling between different time zones.  What are the signs or symptoms? If you have insomnia, trouble falling asleep or trouble staying asleep is the main symptom. This may lead to other symptoms, such as:  Feeling fatigued.  Feeling nervous about going to sleep.  Not feeling rested in the morning.  Having trouble concentrating.  Feeling irritable, anxious, or depressed.  How is this treated? Treatment for insomnia depends on the cause. If your insomnia is caused by an underlying condition, treatment will focus on addressing the condition. Treatment may also include:  Medicines to help you sleep.  Counseling or therapy.  Lifestyle adjustments.  Follow these instructions at home:  Take medicines only as directed by your health care  provider.  Keep regular sleeping and waking hours. Avoid naps.  Keep a sleep diary to help you and your health care provider figure out what could be causing your insomnia. Include: ? When you sleep. ? When you wake up during the night. ? How well you sleep. ? How rested you feel the next day. ? Any side effects of medicines you are taking. ? What you eat and drink.  Make your bedroom a comfortable place where it is easy to fall asleep: ? Put up shades or special blackout curtains to block light from outside. ? Use a white noise machine to block noise. ? Keep the temperature cool.  Exercise regularly as directed by your health care provider. Avoid exercising right before bedtime.  Use relaxation techniques to manage stress. Ask your health care provider to suggest some techniques that may work well for you. These may include: ? Breathing exercises. ? Routines to release muscle tension. ? Visualizing peaceful scenes.  Cut back on alcohol, caffeinated beverages, and cigarettes, especially close to bedtime. These can disrupt your sleep.  Do not overeat or eat spicy foods right before bedtime. This can lead to digestive discomfort that can make it hard for you to sleep.  Limit screen use before bedtime. This includes: ? Watching TV. ? Using your smartphone, tablet, and computer.  Stick to a routine. This can help you fall asleep faster. Try to do a quiet activity, brush your teeth, and go to bed at the same time each night.  Get out of bed if you are still awake after 15 minutes of trying to sleep. Keep the lights down, but try reading or doing a quiet activity. When you feel sleepy, go back to bed.  Make sure that you drive carefully. Avoid driving if you feel very sleepy.  Keep all follow-up appointments as directed by your health care provider. This is important. Contact a health care provider if:  You are tired throughout the day or have trouble in your daily routine due to  sleepiness.  You continue to have sleep problems or your sleep problems get worse. Get help right away if:  You have serious thoughts about hurting yourself or someone else. This information is not intended to replace advice given to you by your health care provider. Make sure you discuss any questions you have with your health care provider. Document Released: 10/25/2000 Document Revised: 03/29/2016 Document Reviewed: 07/29/2014 Elsevier Interactive Patient Education  [employer].

## 2017-06-13 NOTE — Progress Notes (Signed)
Subjective:  Patient ID: Pam Gamble, female    DOB: 05/28/1946  Age: 71 y.o. MRN: [id number]  CC: No chief complaint on file.   HPI Pam Gamble presents for DM, COPD, OA f/u  Outpatient Medications Prior to Visit  Medication Sig Dispense Refill  . albuterol (PROVENTIL HFA;VENTOLIN HFA) 108 (90 BASE) MCG/ACT inhaler Inhale 2 puffs into the lungs every 6 (six) hours as needed for wheezing. 1 Inhaler 5  . B Complex-C-Folic Acid (STRESS B COMPLEX PO) Take 1 capsule by mouth daily.    . Blood Glucose Monitoring Suppl (ONE TOUCH ULTRA SYSTEM KIT) w/Device KIT 1 kit by Does not apply route once. 1 each 0  . Cetirizine HCl (ZYRTEC PO) Take 1 tablet by mouth daily.    . cholecalciferol (VITAMIN D) 1000 UNITS tablet Take 5,000 Units by mouth daily.    . fluticasone (FLONASE) 50 MCG/ACT nasal spray Place 2 sprays into the nose daily as needed for rhinitis. Reported on 01/23/2016    . GINKGO BILOBA COMPLEX PO Take 1 capsule by mouth daily.    Marland Kitchen glucose blood (ONE TOUCH ULTRA TEST) test strip Use to check blood sugars twice a day 200 each 1  . insulin NPH Human (HUMULIN N) 100 UNIT/ML injection Use 40 u in am and 10 u qhs. Titrate up by 1 unit a day each (AM and HS doses) for goal sugars of 100-130 up to 60 units in AM and 20 u at HS 10 mL 11  . Insulin Syringe-Needle U-100 (B-D INS SYRINGE 0.5CC/31GX5/16) 31G X 5/16" 0.5 ML MISC Use qd 100 each 3  . ipratropium-albuterol (DUONEB) 0.5-2.5 (3) MG/3ML SOLN Inhale 3 mLs into the lungs as needed. 360 mL 1  . loratadine (CLARITIN) 10 MG tablet Take 1 tablet (10 mg total) by mouth daily. 100 tablet 3  . metFORMIN (GLUCOPHAGE) 1000 MG tablet TAKE ONE TABLET BY MOUTH TWICE DAILY WITH MEALS 180 tablet 3  . omeprazole (PRILOSEC) 40 MG capsule Take 1 capsule (40 mg total) by mouth daily. 90 capsule 3  . ONE TOUCH LANCETS MISC 1 each by Does not apply route 2 (two) times daily. 100 each 3  . tiZANidine (ZANAFLEX) 4 MG tablet Take 1 tablet (4 mg total) by mouth  every 6 (six) hours as needed for muscle spasms. 40 tablet 0  . traMADol (ULTRAM) 50 MG tablet Take 1 tablet (50 mg total) by mouth every 8 (eight) hours as needed. 50 tablet 0   Facility-Administered Medications Prior to Visit  Medication Dose Route Frequency Provider Last Rate Last Dose  . ipratropium-albuterol (DUONEB) 0.5-2.5 (3) MG/3ML nebulizer solution 3 mL  3 mL Nebulization Once Nche, Pam Brooke, NP        ROS Review of Systems  Constitutional: Negative for activity change, appetite change, chills, fatigue and unexpected weight change.  HENT: Negative for congestion, mouth sores and sinus pressure.   Eyes: Negative for visual disturbance.  Respiratory: Positive for shortness of breath. Negative for cough and chest tightness.   Gastrointestinal: Negative for abdominal pain and nausea.  Genitourinary: Negative for difficulty urinating, frequency and vaginal pain.  Musculoskeletal: Negative for back pain and gait problem.  Skin: Negative for pallor and rash.  Neurological: Negative for dizziness, tremors, weakness, numbness and headaches.  Psychiatric/Behavioral: Negative for confusion and sleep disturbance.    Objective:  BP 130/76 (BP Location: Left Arm, Patient Position: Sitting, Cuff Size: Normal)   Pulse 84   Temp 98.3 F (36.8 C) (Oral)  Ht '5\' 2"'  (1.575 m)   Wt 163 lb (73.9 kg)   SpO2 97%   BMI 29.81 kg/m   BP Readings from Last 3 Encounters:  06/13/17 130/76  04/25/17 124/76  03/13/17 126/76    Wt Readings from Last 3 Encounters:  06/13/17 163 lb (73.9 kg)  04/25/17 167 lb (75.8 kg)  03/13/17 156 lb 1.9 oz (70.8 kg)    Physical Exam  Constitutional: She appears well-developed. No distress.  HENT:  Head: Normocephalic.  Right Ear: External ear normal.  Left Ear: External ear normal.  Nose: Nose normal.  Mouth/Throat: Oropharynx is clear and moist.  Eyes: Pupils are equal, round, and reactive to light. Conjunctivae are normal. Right eye exhibits no  discharge. Left eye exhibits no discharge.  Neck: Normal range of motion. Neck supple. No JVD present. No tracheal deviation present. No thyromegaly present.  Cardiovascular: Normal rate, regular rhythm and normal heart sounds.   Pulmonary/Chest: No stridor. No respiratory distress. She has wheezes.  Abdominal: Soft. Bowel sounds are normal. She exhibits no distension and no mass. There is no tenderness. There is no rebound and no guarding.  Musculoskeletal: She exhibits tenderness. She exhibits no edema.  Lymphadenopathy:    She has no cervical adenopathy.  Neurological: She displays normal reflexes. No cranial nerve deficit. She exhibits normal muscle tone. Coordination normal.  Skin: No rash noted. No erythema.  Psychiatric: She has a normal mood and affect. Her behavior is normal. Judgment and thought content normal.    Lab Results  Component Value Date   WBC 16.7 (H) 12/12/2016   HGB 12.1 12/12/2016   HCT 38.3 12/12/2016   PLT 439.0 (H) 12/12/2016   GLUCOSE 466 (H) 03/13/2017   CHOL 228 (H) 12/12/2016   TRIG 114.0 12/12/2016   HDL 52.60 12/12/2016   LDLDIRECT 160.1 07/12/2014   LDLCALC 153 (H) 12/12/2016   ALT 38 (H) 07/12/2014   AST 45 (H) 07/12/2014   NA 135 03/13/2017   K 4.9 03/13/2017   CL 93 (L) 03/13/2017   CREATININE 0.97 03/13/2017   BUN 17 03/13/2017   CO2 32 03/13/2017   TSH 1.33 12/12/2016   PSA 0.02 (L) 01/01/2008   HGBA1C 12.6 (H) 03/13/2017   MICROALBUR 6.4 (H) 12/12/2016    Dg Lumbar Spine Complete  Result Date: 05/01/2017 CLINICAL DATA:  Low back pain for 4 weeks. EXAM: LUMBAR SPINE - COMPLETE 4+ VIEW COMPARISON:  None. FINDINGS: There is no evidence of lumbar spine fracture. Alignment is normal. There are degenerative joint changes with narrowed joint space and osteophyte formation in the mid to lower lumbar spine. IMPRESSION: No acute fracture dislocation. Osteoarthritic changes of the mid to lower lumbar spine more prominently involving the L4-5  level. Electronically Signed   By: Pam Gamble M.D.   On: 05/01/2017 16:36    Assessment & Plan:   There are no diagnoses linked to this encounter. I am having Pam Gamble maintain her cholecalciferol, B Complex-C-Folic Acid (STRESS B COMPLEX PO), GINKGO BILOBA COMPLEX PO, Cetirizine HCl (ZYRTEC PO), fluticasone, albuterol, omeprazole, ONE TOUCH ULTRA SYSTEM KIT, ONE TOUCH LANCETS, metFORMIN, Insulin Syringe-Needle U-100, ipratropium-albuterol, loratadine, insulin NPH Human, glucose blood, traMADol, and tiZANidine. We will continue to administer ipratropium-albuterol.  No orders of the defined types were placed in this encounter.    Follow-up: No Follow-up on file.  Walker Kehr, MD

## 2017-06-13 NOTE — Progress Notes (Addendum)
Subjective:   Pam Gamble is a 71 y.o. female who presents for Medicare Annual (Subsequent) preventive examination.  Review of Systems:  No ROS.  Medicare Wellness Visit. Additional risk factors are reflected in the social history.    Sleep patterns: has frequent nighttime awakenings, has daytime sleepiness, gets up 1 times nightly to void and sleeps 5-6hours nightly. Patient reports insomnia issues, discussed recommended sleep tips and stress reduction tips, education was attached to patient's AVS.  Home Safety/Smoke Alarms: Feels safe in home. Smoke alarms in place.  Living environment; residence and Firearm Safety: 1-story house/ trailer, no firearms. Lives alone, no needs for DME, good support system Seat Belt Safety/Bike Helmet: Wears seat belt.   Counseling:   Eye Exam- appointment yearly Dental- dentures  Female:   Pap- N/A    Mammo- Last 02/24/08, BI-RADS category 1: negative, declined referral        Dexa scan- Last 12/20/15, T-score -1.1     CCS- Last 10/13/01, recall 10 years, declined referral     Objective:     Vitals: BP 130/76 (BP Location: Left Arm, Patient Position: Sitting, Cuff Size: Normal)   Pulse 84   Temp 98.3 F (36.8 C) (Oral)   Ht '5\' 2"'  (1.575 m)   Wt 163 lb (73.9 kg)   SpO2 97%   BMI 29.81 kg/m   Body mass index is 29.81 kg/m.   Tobacco History  Smoking Status  . Former Smoker  . Packs/day: 2.00  . Years: 49.00  . Types: Cigarettes  . Quit date: 11/11/2005  Smokeless Tobacco  . Never Used     Counseling given: Not Answered   Past Medical History:  Diagnosis Date  . Barrett's esophagus   . Calcium deposits in tendon and bursa   . Chronic airway obstruction, not elsewhere classified   . COPD (chronic obstructive pulmonary disease) ([place])   . Depression   . Diabetes mellitus 2008ish   type two  . Esophageal reflux   . Idiopathic thrombocythemia   . Kidney stones   . Lateral epicondylitis  of elbow   . Personal history of  malignant neoplasm of other parts of uterus   . Personal history of other diseases of digestive system   . Shortness of breath    upon exertion   Past Surgical History:  Procedure Laterality Date  . ABDOMINAL HYSTERECTOMY    . CATARACT EXTRACTION Bilateral 01/2016 and 03/2016  . LITHOTRIPSY    . REFRACTIVE SURGERY Right 05/16/2016  . SPLENECTOMY  1999  . TONSILLECTOMY AND ADENOIDECTOMY     Family History  Problem Relation Age of Onset  . Liver cancer Sister   . Pancreatic cancer Sister   . Stroke Mother   . Heart disease Mother        CHF  . Heart disease Father        MI  . Diabetes Father   . Diabetes Sister   . Diabetes Maternal Aunt    History  Sexual Activity  . Sexual activity: Not Currently    Outpatient Encounter Prescriptions as of 06/13/2017  Medication Sig  . albuterol (PROVENTIL HFA;VENTOLIN HFA) 108 (90 BASE) MCG/ACT inhaler Inhale 2 puffs into the lungs every 6 (six) hours as needed for wheezing.  . B Complex-C-Folic Acid (STRESS B COMPLEX PO) Take 1 capsule by mouth daily.  . Blood Glucose Monitoring Suppl (ONE TOUCH ULTRA SYSTEM KIT) w/Device KIT 1 kit by Does not apply route once.  . Cetirizine HCl (ZYRTEC PO)  Take 1 tablet by mouth daily.  . cholecalciferol (VITAMIN D) 1000 UNITS tablet Take 5,000 Units by mouth daily.  . fluticasone (FLONASE) 50 MCG/ACT nasal spray Place 2 sprays into the nose daily as needed for rhinitis. Reported on 01/23/2016  . GINKGO BILOBA COMPLEX PO Take 1 capsule by mouth daily.  Marland Kitchen glucose blood (ONE TOUCH ULTRA TEST) test strip Use to check blood sugars twice a day  . insulin NPH Human (HUMULIN N) 100 UNIT/ML injection Use 40 u in am and 10 u qhs. Titrate up by 1 unit a day each (AM and HS doses) for goal sugars of 100-130 up to 60 units in AM and 20 u at HS  . Insulin Syringe-Needle U-100 (B-D INS SYRINGE 0.5CC/31GX5/16) 31G X 5/16" 0.5 ML MISC Use qd  . ipratropium-albuterol (DUONEB) 0.5-2.5 (3) MG/3ML SOLN Inhale 3 mLs into the  lungs as needed.  . loratadine (CLARITIN) 10 MG tablet Take 1 tablet (10 mg total) by mouth daily.  . metFORMIN (GLUCOPHAGE) 1000 MG tablet TAKE ONE TABLET BY MOUTH TWICE DAILY WITH MEALS  . omeprazole (PRILOSEC) 40 MG capsule Take 1 capsule (40 mg total) by mouth daily.  . ONE TOUCH LANCETS MISC 1 each by Does not apply route 2 (two) times daily.  Marland Kitchen tiZANidine (ZANAFLEX) 4 MG tablet Take 1 tablet (4 mg total) by mouth every 6 (six) hours as needed for muscle spasms.  . traMADol (ULTRAM) 50 MG tablet Take 1 tablet (50 mg total) by mouth every 8 (eight) hours as needed.  Marland Kitchen levofloxacin (LEVAQUIN) 500 MG tablet Take 1 tablet (500 mg total) by mouth daily.  . predniSONE (DELTASONE) 20 MG tablet Take 2 tablets (40 mg total) by mouth daily. Take 40 mg by mouth daily for 3 days, then 23m by mouth daily for 3 days, then 121mdaily for 3 days   Facility-Administered Encounter Medications as of 06/13/2017  Medication  . ipratropium-albuterol (DUONEB) 0.5-2.5 (3) MG/3ML nebulizer solution 3 mL    Activities of Daily Living No flowsheet data found.  Patient Care Team: Plotnikov, AlEvie LacksMD as PCP - General NeLowella BandyMD as Attending Physician (Urology) ElRenato ShinMD as Attending Physician (Internal Medicine)    Assessment:    Physical assessment deferred to PCP.  Exercise Activities and Dietary recommendations   Diet (meal preparation, eat out, water intake, caffeinated beverages, dairy products, fruits and vegetables): in general, a "healthy" diet  , drinks mainly sweet tea, 1-2 glasses of water daily, eats a variety of vegetables and fruits.  Reviewed heart healthy and diabetic diet, encouraged patient to increase daily water intake.   Goals      Patient Stated   . patient (pt-stated)          Stay healthy enough to go hunting and fishing;  Staying out of cold weather or appropriate garments; To wear mask; can try kerchief;   Keep doctors apt      Fall Risk Fall Risk   12/12/2016 11/29/2015  Falls in the past year? No Yes  Comment - hasn't been back out since breathing got worse  Number falls in past yr: - 1  Injury with Fall? - No  Follow up - Education provided   Depression Screen PHQ 2/9 Scores 12/12/2016 11/29/2015 11/29/2015  PHQ - 2 Score 0 0 0     Cognitive Function       Ad8 score reviewed for issues:  Issues making decisions: no  Less interest in hobbies / activities: no  Repeats questions, stories (family complaining): no  Trouble using ordinary gadgets (microwave, computer, phone):no  Forgets the month or year: no  Mismanaging finances: no  Remembering appts: no  Daily problems with thinking and/or memory: no Ad8 score is= 0  Immunization History  Administered Date(s) Administered  . Influenza Split 01/05/2013  . Influenza Whole 08/15/2008, 07/06/2010  . Influenza, High Dose Seasonal PF 07/11/2016  . Influenza,inj,Quad PF,36+ Mos 08/06/2013, 07/27/2014  . Influenza-Unspecified 08/13/2015  . Pneumococcal Conjugate-13 08/06/2013  . Pneumococcal Polysaccharide-23 09/14/2004, 06/14/2009, 06/05/2017  . Pneumococcal-Unspecified 08/13/2015  . Tetanus 01/28/2013  . Zoster 07/27/2014   Screening Tests Health Maintenance  Topic Date Due  . FOOT EXAM  05/15/1956  . MAMMOGRAM  02/23/2010  . COLONOSCOPY  10/14/2011  . OPHTHALMOLOGY EXAM  11/14/2016  . INFLUENZA VACCINE  06/11/2017  . HEMOGLOBIN A1C  09/13/2017  . URINE MICROALBUMIN  12/12/2017  . TETANUS/TDAP  01/29/2023  . DEXA SCAN  Completed  . Hepatitis C Screening  Completed  . PNA vac Low Risk Adult  Completed      Plan:   Start to eat heart healthy diet (full of fruits, vegetables, whole grains, lean protein, water--limit salt, fat, and sugar intake) and increase physical activity as tolerated.  Continue doing brain stimulating activities (puzzles, reading, adult coloring books, staying active) to keep memory sharp.   I have personally reviewed and noted the  following in the patient's chart:   . Medical and social history . Use of alcohol, tobacco or illicit drugs  . Current medications and supplements . Functional ability and status . Nutritional status . Physical activity . Advanced directives . List of other physicians . Vitals . Screenings to include cognitive, depression, and falls . Referrals and appointments  In addition, I have reviewed and discussed with patient certain preventive protocols, quality metrics, and best practice recommendations. A written personalized care plan for preventive services as well as general preventive health recommendations were provided to patient.     Michiel Cowboy, RN  06/13/2017  Medical screening examination/treatment/procedure(s) were performed by non-physician practitioner and as supervising physician I was immediately available for consultation/collaboration. I agree with above. Walker Kehr, MD

## 2017-06-13 NOTE — Assessment & Plan Note (Signed)
Levaquin Rx 

## 2017-06-13 NOTE — Assessment & Plan Note (Signed)
Labs On Insulin

## 2017-06-13 NOTE — Assessment & Plan Note (Signed)
On Mg

## 2017-06-13 NOTE — Progress Notes (Signed)
Pre visit review using our clinic review tool, if applicable. No additional management support is needed unless otherwise documented below in the visit note. 

## 2017-06-13 NOTE — Assessment & Plan Note (Signed)
Duoneb Portable nebulizer Rx

## 2017-06-18 DIAGNOSIS — R69 Illness, unspecified: Secondary | ICD-10-CM | POA: Diagnosis not present

## 2017-07-04 ENCOUNTER — Ambulatory Visit (INDEPENDENT_AMBULATORY_CARE_PROVIDER_SITE_OTHER): Payer: Medicare HMO | Admitting: Internal Medicine

## 2017-07-04 ENCOUNTER — Encounter: Payer: Self-pay | Admitting: Internal Medicine

## 2017-07-04 DIAGNOSIS — Z794 Long term (current) use of insulin: Secondary | ICD-10-CM | POA: Diagnosis not present

## 2017-07-04 DIAGNOSIS — E1100 Type 2 diabetes mellitus with hyperosmolarity without nonketotic hyperglycemic-hyperosmolar coma (NKHHC): Secondary | ICD-10-CM

## 2017-07-04 DIAGNOSIS — J441 Chronic obstructive pulmonary disease with (acute) exacerbation: Secondary | ICD-10-CM

## 2017-07-04 MED ORDER — ERTUGLIFLOZIN L-PYROGLUTAMICAC 5 MG PO TABS: 5.0000 mg | ORAL_TABLET | Freq: Every day | ORAL | 11 refills | Status: DC

## 2017-07-04 MED ORDER — AZITHROMYCIN 250 MG PO TABS: ORAL_TABLET | ORAL | 1 refills | Status: DC

## 2017-07-04 MED ORDER — METHYLPREDNISOLONE ACETATE 80 MG/ML IJ SUSP
80.0000 mg | Freq: Once | INTRAMUSCULAR | Status: AC
  Administered 2017-07-04: 80 mg via INTRAMUSCULAR

## 2017-07-04 MED ORDER — PROMETHAZINE-CODEINE 6.25-10 MG/5ML PO SYRP: 5.0000 mL | ORAL_SOLUTION | ORAL | 0 refills | Status: DC | PRN

## 2017-07-04 MED ORDER — UMECLIDINIUM-VILANTEROL 62.5-25 MCG/INH IN AEPB: 1.0000 | INHALATION_SPRAY | Freq: Every day | RESPIRATORY_TRACT | 11 refills | Status: DC

## 2017-07-04 NOTE — Progress Notes (Signed)
Subjective:  Patient ID: Pam Gamble, female    DOB: 17-Aug-1946  Age: 71 y.o. MRN: [id number]  CC: No chief complaint on file.   HPI Pam Gamble presents for wheezing, SOB w/exertion x 1 week  Outpatient Medications Prior to Visit  Medication Sig Dispense Refill  . albuterol (PROVENTIL HFA;VENTOLIN HFA) 108 (90 BASE) MCG/ACT inhaler Inhale 2 puffs into the lungs every 6 (six) hours as needed for wheezing. 1 Inhaler 5  . B Complex-C-Folic Acid (STRESS B COMPLEX PO) Take 1 capsule by mouth daily.    . Blood Glucose Monitoring Suppl (ONE TOUCH ULTRA SYSTEM KIT) w/Device KIT 1 kit by Does not apply route once. 1 each 0  . Cetirizine HCl (ZYRTEC PO) Take 1 tablet by mouth daily.    . cholecalciferol (VITAMIN D) 1000 UNITS tablet Take 5,000 Units by mouth daily.    . fluticasone (FLONASE) 50 MCG/ACT nasal spray Place 2 sprays into the nose daily as needed for rhinitis. Reported on 01/23/2016    . GINKGO BILOBA COMPLEX PO Take 1 capsule by mouth daily.    Marland Kitchen glucose blood (ONE TOUCH ULTRA TEST) test strip Use to check blood sugars twice a day 200 each 1  . insulin NPH Human (HUMULIN N) 100 UNIT/ML injection Use 40 u in am and 10 u qhs. Titrate up by 1 unit a day each (AM and HS doses) for goal sugars of 100-130 up to 60 units in AM and 20 u at HS 10 mL 11  . Insulin Syringe-Needle U-100 (B-D INS SYRINGE 0.5CC/31GX5/16) 31G X 5/16" 0.5 ML MISC Use qd 100 each 3  . ipratropium-albuterol (DUONEB) 0.5-2.5 (3) MG/3ML SOLN Inhale 3 mLs into the lungs as needed. 360 mL 1  . levofloxacin (LEVAQUIN) 500 MG tablet Take 1 tablet (500 mg total) by mouth daily. 7 tablet 0  . loratadine (CLARITIN) 10 MG tablet Take 1 tablet (10 mg total) by mouth daily. 100 tablet 3  . metFORMIN (GLUCOPHAGE) 1000 MG tablet TAKE ONE TABLET BY MOUTH TWICE DAILY WITH MEALS 180 tablet 3  . omeprazole (PRILOSEC) 40 MG capsule Take 1 capsule (40 mg total) by mouth daily. 90 capsule 3  . ONE TOUCH LANCETS MISC 1 each by Does not  apply route 2 (two) times daily. 100 each 3  . tiZANidine (ZANAFLEX) 4 MG tablet Take 1 tablet (4 mg total) by mouth every 6 (six) hours as needed for muscle spasms. 40 tablet 0  . traMADol (ULTRAM) 50 MG tablet Take 1 tablet (50 mg total) by mouth every 8 (eight) hours as needed. 50 tablet 0   Facility-Administered Medications Prior to Visit  Medication Dose Route Frequency Provider Last Rate Last Dose  . ipratropium-albuterol (DUONEB) 0.5-2.5 (3) MG/3ML nebulizer solution 3 mL  3 mL Nebulization Once Nche, Charlene Brooke, NP        ROS Review of Systems  Constitutional: Positive for fatigue. Negative for activity change, appetite change, chills and unexpected weight change.  HENT: Positive for congestion. Negative for mouth sores and sinus pressure.   Eyes: Negative for visual disturbance.  Respiratory: Positive for cough, chest tightness, shortness of breath and wheezing.   Gastrointestinal: Negative for abdominal pain and nausea.  Genitourinary: Negative for difficulty urinating, frequency and vaginal pain.  Musculoskeletal: Positive for back pain. Negative for gait problem.  Skin: Negative for pallor and rash.  Neurological: Positive for weakness. Negative for dizziness, tremors, numbness and headaches.  Psychiatric/Behavioral: Negative for confusion and sleep disturbance.  Objective:  BP 138/76 (BP Location: Left Arm, Patient Position: Sitting, Cuff Size: Large)   Pulse 68   Temp 98.2 F (36.8 C) (Oral)   Ht '5\' 2"'$  (1.575 m)   Wt 170 lb (77.1 kg)   SpO2 98%   BMI 31.09 kg/m   BP Readings from Last 3 Encounters:  07/04/17 138/76  06/13/17 130/76  04/25/17 124/76    Wt Readings from Last 3 Encounters:  07/04/17 170 lb (77.1 kg)  06/13/17 163 lb (73.9 kg)  04/25/17 167 lb (75.8 kg)    Physical Exam  Constitutional: She appears well-developed. No distress.  HENT:  Head: Normocephalic.  Right Ear: External ear normal.  Left Ear: External ear normal.  Nose: Nose  normal.  Mouth/Throat: Oropharynx is clear and moist.  Eyes: Pupils are equal, round, and reactive to light. Conjunctivae are normal. Right eye exhibits no discharge. Left eye exhibits no discharge.  Neck: Normal range of motion. Neck supple. No JVD present. No tracheal deviation present. No thyromegaly present.  Cardiovascular: Normal rate, regular rhythm and normal heart sounds.   Pulmonary/Chest: No stridor. No respiratory distress. She has wheezes.  Abdominal: Soft. Bowel sounds are normal. She exhibits no distension and no mass. There is no tenderness. There is no rebound and no guarding.  Musculoskeletal: She exhibits no edema or tenderness.  Lymphadenopathy:    She has no cervical adenopathy.  Neurological: She displays normal reflexes. No cranial nerve deficit. She exhibits normal muscle tone. Coordination normal.  Skin: No rash noted. No erythema.  Psychiatric: She has a normal mood and affect. Her behavior is normal. Judgment and thought content normal.    Lab Results  Component Value Date   WBC 16.7 (H) 12/12/2016   HGB 12.1 12/12/2016   HCT 38.3 12/12/2016   PLT 439.0 (H) 12/12/2016   GLUCOSE 141 (H) 06/13/2017   CHOL 228 (H) 12/12/2016   TRIG 114.0 12/12/2016   HDL 52.60 12/12/2016   LDLDIRECT 160.1 07/12/2014   LDLCALC 153 (H) 12/12/2016   ALT 38 (H) 07/12/2014   AST 45 (H) 07/12/2014   NA 140 06/13/2017   K 3.9 06/13/2017   CL 103 06/13/2017   CREATININE 1.02 06/13/2017   BUN 27 (H) 06/13/2017   CO2 28 06/13/2017   TSH 1.33 12/12/2016   PSA 0.02 (L) 01/01/2008   HGBA1C 11.1 (H) 06/13/2017   MICROALBUR 6.4 (H) 12/12/2016    Dg Lumbar Spine Complete  Result Date: 05/01/2017 CLINICAL DATA:  Low back pain for 4 weeks. EXAM: LUMBAR SPINE - COMPLETE 4+ VIEW COMPARISON:  None. FINDINGS: There is no evidence of lumbar spine fracture. Alignment is normal. There are degenerative joint changes with narrowed joint space and osteophyte formation in the mid to lower lumbar  spine. IMPRESSION: No acute fracture dislocation. Osteoarthritic changes of the mid to lower lumbar spine more prominently involving the L4-5 level. Electronically Signed   By: Abelardo Diesel M.D.   On: 05/01/2017 16:36    Assessment & Plan:   There are no diagnoses linked to this encounter. I am having Ms. Cohenour maintain her cholecalciferol, B Complex-C-Folic Acid (STRESS B COMPLEX PO), GINKGO BILOBA COMPLEX PO, Cetirizine HCl (ZYRTEC PO), fluticasone, albuterol, omeprazole, ONE TOUCH ULTRA SYSTEM KIT, ONE TOUCH LANCETS, metFORMIN, Insulin Syringe-Needle U-100, ipratropium-albuterol, loratadine, insulin NPH Human, glucose blood, traMADol, tiZANidine, and levofloxacin. We will continue to administer ipratropium-albuterol.  No orders of the defined types were placed in this encounter.    Follow-up: No Follow-up on file.  Alex  Despina Boan, MD

## 2017-07-04 NOTE — Addendum Note (Signed)
Addended by: Karren Cobble on: 07/04/2017 11:26 AM   Modules accepted: Orders

## 2017-07-04 NOTE — Assessment & Plan Note (Signed)
Depo-medrol 80 mg IM Prom-cod syr Anoro HHN Zpac

## 2017-07-04 NOTE — Assessment & Plan Note (Signed)
Added [employer]

## 2017-07-10 ENCOUNTER — Encounter: Payer: Self-pay | Admitting: Internal Medicine

## 2017-07-17 ENCOUNTER — Telehealth: Payer: Self-pay | Admitting: Internal Medicine

## 2017-07-17 NOTE — Telephone Encounter (Signed)
Patient states her COPD is not doing any better. She would like to know if there is anything else that she could take for this. She is still wheezing a lot. Please follow up with patient. Thank you.

## 2017-07-18 NOTE — Telephone Encounter (Signed)
Please advise 

## 2017-07-21 NOTE — Telephone Encounter (Signed)
Pls ask [facility] to sch an appt w/Dr Mannam (Pulmonology) TRhx

## 2017-07-24 NOTE — Telephone Encounter (Signed)
LM notifying pt

## 2017-08-16 DIAGNOSIS — R69 Illness, unspecified: Secondary | ICD-10-CM | POA: Diagnosis not present

## 2017-09-16 ENCOUNTER — Ambulatory Visit (INDEPENDENT_AMBULATORY_CARE_PROVIDER_SITE_OTHER): Payer: Medicare HMO | Admitting: Internal Medicine

## 2017-09-16 ENCOUNTER — Other Ambulatory Visit (INDEPENDENT_AMBULATORY_CARE_PROVIDER_SITE_OTHER): Payer: Medicare HMO

## 2017-09-16 ENCOUNTER — Encounter: Payer: Self-pay | Admitting: Internal Medicine

## 2017-09-16 VITALS — BP 138/82 | HR 81 | Temp 98.2°F | Wt 170.0 lb

## 2017-09-16 DIAGNOSIS — Z794 Long term (current) use of insulin: Secondary | ICD-10-CM

## 2017-09-16 DIAGNOSIS — Z23 Encounter for immunization: Secondary | ICD-10-CM | POA: Diagnosis not present

## 2017-09-16 DIAGNOSIS — Z9081 Acquired absence of spleen: Secondary | ICD-10-CM

## 2017-09-16 DIAGNOSIS — J441 Chronic obstructive pulmonary disease with (acute) exacerbation: Secondary | ICD-10-CM | POA: Diagnosis not present

## 2017-09-16 DIAGNOSIS — E1165 Type 2 diabetes mellitus with hyperglycemia: Secondary | ICD-10-CM

## 2017-09-16 DIAGNOSIS — E1100 Type 2 diabetes mellitus with hyperosmolarity without nonketotic hyperglycemic-hyperosmolar coma (NKHHC): Secondary | ICD-10-CM | POA: Diagnosis not present

## 2017-09-16 DIAGNOSIS — M545 Low back pain: Secondary | ICD-10-CM | POA: Diagnosis not present

## 2017-09-16 LAB — BASIC METABOLIC PANEL
BUN: 18 mg/dL (ref 6–23)
CHLORIDE: 103 meq/L (ref 96–112)
CO2: 27 mEq/L (ref 19–32)
Calcium: 9.5 mg/dL (ref 8.4–10.5)
Creatinine, Ser: 0.91 mg/dL (ref 0.40–1.20)
GFR: 64.71 mL/min (ref >60.00)
Glucose, Bld: 122 mg/dL — ABNORMAL HIGH (ref 70–99)
POTASSIUM: 4.6 meq/L (ref 3.5–5.1)
SODIUM: 139 meq/L (ref 135–145)

## 2017-09-16 LAB — HEMOGLOBIN A1C: HEMOGLOBIN A1C: 9.7 % — AB (ref 4.6–6.5)

## 2017-09-16 NOTE — Assessment & Plan Note (Signed)
Chronic pain. Doing ok now - deer hunting.Pam KitchenMarland Gamble

## 2017-09-16 NOTE — Assessment & Plan Note (Signed)
Po abx prn Meningococcal vaccine

## 2017-09-16 NOTE — Addendum Note (Signed)
Addended by: Karren Cobble on: 09/16/2017 12:48 PM   Modules accepted: Orders

## 2017-09-16 NOTE — Progress Notes (Signed)
Subjective:  Patient ID: Pam Gamble, female    DOB: 1946-04-04  Age: 71 y.o. MRN: [id number]  CC: No chief complaint on file.   HPI Pam Gamble presents for DM - not taking Stiglatro - "it made me feel funny", COPD. OA f/u  Outpatient Medications Prior to Visit  Medication Sig Dispense Refill  . albuterol (PROVENTIL HFA;VENTOLIN HFA) 108 (90 BASE) MCG/ACT inhaler Inhale 2 puffs into the lungs every 6 (six) hours as needed for wheezing. 1 Inhaler 5  . azithromycin (ZITHROMAX Z-PAK) 250 MG tablet As directed 6 tablet 1  . B Complex-C-Folic Acid (STRESS B COMPLEX PO) Take 1 capsule by mouth daily.    . Blood Glucose Monitoring Suppl (ONE TOUCH ULTRA SYSTEM KIT) w/Device KIT 1 kit by Does not apply route once. 1 each 0  . Cetirizine HCl (ZYRTEC PO) Take 1 tablet by mouth daily.    . cholecalciferol (VITAMIN D) 1000 UNITS tablet Take 5,000 Units by mouth daily.    . fluticasone (FLONASE) 50 MCG/ACT nasal spray Place 2 sprays into the nose daily as needed for rhinitis. Reported on 01/23/2016    . GINKGO BILOBA COMPLEX PO Take 1 capsule by mouth daily.    Marland Kitchen glucose blood (ONE TOUCH ULTRA TEST) test strip Use to check blood sugars twice a day 200 each 1  . insulin NPH Human (HUMULIN N) 100 UNIT/ML injection Use 40 u in am and 10 u qhs. Titrate up by 1 unit a day each (AM and HS doses) for goal sugars of 100-130 up to 60 units in AM and 20 u at HS 10 mL 11  . Insulin Syringe-Needle U-100 (B-D INS SYRINGE 0.5CC/31GX5/16) 31G X 5/16" 0.5 ML MISC Use qd 100 each 3  . ipratropium-albuterol (DUONEB) 0.5-2.5 (3) MG/3ML SOLN Inhale 3 mLs into the lungs as needed. 360 mL 1  . loratadine (CLARITIN) 10 MG tablet Take 1 tablet (10 mg total) by mouth daily. 100 tablet 3  . metFORMIN (GLUCOPHAGE) 1000 MG tablet TAKE ONE TABLET BY MOUTH TWICE DAILY WITH MEALS 180 tablet 3  . omeprazole (PRILOSEC) 40 MG capsule Take 1 capsule (40 mg total) by mouth daily. 90 capsule 3  . ONE TOUCH LANCETS MISC 1 each by Does  not apply route 2 (two) times daily. 100 each 3  . promethazine-codeine (PHENERGAN WITH CODEINE) 6.25-10 MG/5ML syrup Take 5 mLs by mouth every 4 (four) hours as needed. 300 mL 0  . tiZANidine (ZANAFLEX) 4 MG tablet Take 1 tablet (4 mg total) by mouth every 6 (six) hours as needed for muscle spasms. 40 tablet 0  . traMADol (ULTRAM) 50 MG tablet Take 1 tablet (50 mg total) by mouth every 8 (eight) hours as needed. 50 tablet 0  . umeclidinium-vilanterol (ANORO ELLIPTA) 62.5-25 MCG/INH AEPB Inhale 1 puff into the lungs daily. 1 each 11  . Ertugliflozin L-PyroglutamicAc (STEGLATRO) 5 MG TABS Take 5 mg by mouth daily. (Patient not taking: Reported on 09/16/2017) 30 tablet 11   Facility-Administered Medications Prior to Visit  Medication Dose Route Frequency Provider Last Rate Last Dose  . ipratropium-albuterol (DUONEB) 0.5-2.5 (3) MG/3ML nebulizer solution 3 mL  3 mL Nebulization Once Nche, Charlene Brooke, NP        ROS Review of Systems  Constitutional: Negative for activity change, appetite change, chills, fatigue and unexpected weight change.  HENT: Negative for congestion, mouth sores and sinus pressure.   Eyes: Negative for visual disturbance.  Respiratory: Positive for shortness of breath. Negative  for cough and chest tightness.   Gastrointestinal: Negative for abdominal pain and nausea.  Genitourinary: Negative for difficulty urinating, frequency and vaginal pain.  Musculoskeletal: Negative for back pain and gait problem.  Skin: Negative for pallor and rash.  Neurological: Negative for dizziness, tremors, weakness, numbness and headaches.  Psychiatric/Behavioral: Negative for confusion and sleep disturbance.    Objective:  BP 138/82   Pulse 81   Temp 98.2 F (36.8 C)   Wt 170 lb (77.1 kg)   SpO2 99%   BMI 31.09 kg/m   BP Readings from Last 3 Encounters:  09/16/17 138/82  07/04/17 138/76  06/13/17 130/76    Wt Readings from Last 3 Encounters:  09/16/17 170 lb (77.1 kg)    07/04/17 170 lb (77.1 kg)  06/13/17 163 lb (73.9 kg)    Physical Exam  Constitutional: She appears well-developed. No distress.  HENT:  Head: Normocephalic.  Right Ear: External ear normal.  Left Ear: External ear normal.  Nose: Nose normal.  Mouth/Throat: Oropharynx is clear and moist.  Eyes: Conjunctivae are normal. Pupils are equal, round, and reactive to light. Right eye exhibits no discharge. Left eye exhibits no discharge.  Neck: Normal range of motion. Neck supple. No JVD present. No tracheal deviation present. No thyromegaly present.  Cardiovascular: Normal rate, regular rhythm and normal heart sounds.  Pulmonary/Chest: No stridor. No respiratory distress. She has no wheezes.  Abdominal: Soft. Bowel sounds are normal. She exhibits no distension and no mass. There is no tenderness. There is no rebound and no guarding.  Musculoskeletal: She exhibits no edema or tenderness.  Lymphadenopathy:    She has no cervical adenopathy.  Neurological: She displays normal reflexes. No cranial nerve deficit. She exhibits normal muscle tone. Coordination normal.  Skin: No rash noted. No erythema.  Psychiatric: She has a normal mood and affect. Her behavior is normal. Judgment and thought content normal.    Lab Results  Component Value Date   WBC 16.7 (H) 12/12/2016   HGB 12.1 12/12/2016   HCT 38.3 12/12/2016   PLT 439.0 (H) 12/12/2016   GLUCOSE 141 (H) 06/13/2017   CHOL 228 (H) 12/12/2016   TRIG 114.0 12/12/2016   HDL 52.60 12/12/2016   LDLDIRECT 160.1 07/12/2014   LDLCALC 153 (H) 12/12/2016   ALT 38 (H) 07/12/2014   AST 45 (H) 07/12/2014   NA 140 06/13/2017   K 3.9 06/13/2017   CL 103 06/13/2017   CREATININE 1.02 06/13/2017   BUN 27 (H) 06/13/2017   CO2 28 06/13/2017   TSH 1.33 12/12/2016   PSA 0.02 (L) 01/01/2008   HGBA1C 11.1 (H) 06/13/2017   MICROALBUR 6.4 (H) 12/12/2016    Dg Lumbar Spine Complete  Result Date: 05/01/2017 CLINICAL DATA:  Low back pain for 4 weeks.  EXAM: LUMBAR SPINE - COMPLETE 4+ VIEW COMPARISON:  None. FINDINGS: There is no evidence of lumbar spine fracture. Alignment is normal. There are degenerative joint changes with narrowed joint space and osteophyte formation in the mid to lower lumbar spine. IMPRESSION: No acute fracture dislocation. Osteoarthritic changes of the mid to lower lumbar spine more prominently involving the L4-5 level. Electronically Signed   By: Abelardo Diesel M.D.   On: 05/01/2017 16:36    Assessment & Plan:   There are no diagnoses linked to this encounter. I am having Javeah B. Zale maintain her cholecalciferol, B Complex-C-Folic Acid (STRESS B COMPLEX PO), GINKGO BILOBA COMPLEX PO, Cetirizine HCl (ZYRTEC PO), fluticasone, albuterol, omeprazole, ONE TOUCH ULTRA SYSTEM KIT, ONE  TOUCH LANCETS, metFORMIN, Insulin Syringe-Needle U-100, ipratropium-albuterol, loratadine, insulin NPH Human, glucose blood, traMADol, tiZANidine, Ertugliflozin L-PyroglutamicAc, azithromycin, promethazine-codeine, and umeclidinium-vilanterol. We will continue to administer ipratropium-albuterol.  No orders of the defined types were placed in this encounter.    Follow-up: No Follow-up on file.  Walker Kehr, MD

## 2017-09-16 NOTE — Assessment & Plan Note (Signed)
not taking Stiglatro - "it made me feel funny" Cont w/other meds Titrate up NPH

## 2017-09-16 NOTE — Assessment & Plan Note (Signed)
Doing well at present 

## 2017-09-22 DIAGNOSIS — H5203 Hypermetropia, bilateral: Secondary | ICD-10-CM | POA: Diagnosis not present

## 2017-09-22 DIAGNOSIS — H524 Presbyopia: Secondary | ICD-10-CM | POA: Diagnosis not present

## 2017-09-22 DIAGNOSIS — Z9842 Cataract extraction status, left eye: Secondary | ICD-10-CM | POA: Diagnosis not present

## 2017-09-22 DIAGNOSIS — H52223 Regular astigmatism, bilateral: Secondary | ICD-10-CM | POA: Diagnosis not present

## 2017-09-22 DIAGNOSIS — E119 Type 2 diabetes mellitus without complications: Secondary | ICD-10-CM | POA: Diagnosis not present

## 2017-09-22 DIAGNOSIS — H26491 Other secondary cataract, right eye: Secondary | ICD-10-CM | POA: Diagnosis not present

## 2017-09-22 LAB — HM DIABETES EYE EXAM

## 2017-10-01 ENCOUNTER — Other Ambulatory Visit: Payer: Self-pay | Admitting: Internal Medicine

## 2017-10-11 DIAGNOSIS — R69 Illness, unspecified: Secondary | ICD-10-CM | POA: Diagnosis not present

## 2017-12-17 ENCOUNTER — Ambulatory Visit: Payer: Medicare HMO | Admitting: Internal Medicine

## 2017-12-29 ENCOUNTER — Ambulatory Visit (INDEPENDENT_AMBULATORY_CARE_PROVIDER_SITE_OTHER)
Admission: RE | Admit: 2017-12-29 | Discharge: 2017-12-29 | Disposition: A | Discharge status: Home / Self Care | Payer: Medicare HMO | Source: Ambulatory Visit | Attending: Acute Care | Admitting: Acute Care

## 2017-12-29 ENCOUNTER — Ambulatory Visit: Payer: Medicare HMO | Admitting: Acute Care

## 2017-12-29 ENCOUNTER — Encounter: Payer: Self-pay | Admitting: Acute Care

## 2017-12-29 VITALS — BP 156/80 | HR 114 | Ht 62.0 in | Wt 167.2 lb

## 2017-12-29 DIAGNOSIS — R05 Cough: Secondary | ICD-10-CM | POA: Diagnosis not present

## 2017-12-29 DIAGNOSIS — R0602 Shortness of breath: Secondary | ICD-10-CM | POA: Diagnosis not present

## 2017-12-29 DIAGNOSIS — J441 Chronic obstructive pulmonary disease with (acute) exacerbation: Secondary | ICD-10-CM

## 2017-12-29 DIAGNOSIS — J439 Emphysema, unspecified: Secondary | ICD-10-CM | POA: Diagnosis not present

## 2017-12-29 DIAGNOSIS — R69 Illness, unspecified: Secondary | ICD-10-CM | POA: Diagnosis not present

## 2017-12-29 DIAGNOSIS — Z87891 Personal history of nicotine dependence: Secondary | ICD-10-CM

## 2017-12-29 DIAGNOSIS — Z720 Tobacco use: Secondary | ICD-10-CM

## 2017-12-29 MED ORDER — PREDNISONE 10 MG PO TABS: ORAL_TABLET | ORAL | 0 refills | Status: DC

## 2017-12-29 MED ORDER — IPRATROPIUM-ALBUTEROL 0.5-2.5 (3) MG/3ML IN SOLN: 3.0000 mL | RESPIRATORY_TRACT | 1 refills | Status: DC | PRN

## 2017-12-29 MED ORDER — DOXYCYCLINE HYCLATE 100 MG PO TABS: 100.0000 mg | ORAL_TABLET | Freq: Two times a day (BID) | ORAL | 0 refills | Status: DC

## 2017-12-29 NOTE — Patient Instructions (Addendum)
It is good to meet you today. CXR today We will call you with results. We will do a Xopenex breathing treatment now. Prednisone taper; 10 mg tablets: 4 tabs x 2 days, 3 tabs x 2 days, 2 tabs x 2 days 1 tab x 2 days then stop. Doxycycline 100 mg twice daily x 7 days. Continue Mucinex DM as you have been doing.  Make sure you drink a full glass of water with medication. Continue your Duonebs 3 times a day while you are this sick. Rinse mouth after use. We will renew your Duoneb prescription. Continue Anoro daily for now. We will re-evaluate your maintenance inhaler once you are over your flare. We will schedule PFT's when you are over your flare. Follow up in 2 weeks with Dr. Vaughan Browner or Judson Roch NP. Please contact office for sooner follow up if symptoms do not improve or worsen or seek emergency care

## 2017-12-29 NOTE — Progress Notes (Signed)
History of Present Illness Pam Gamble is a 72 y.o. female former smoker ( 70 pack year history quit 2007) with COPD. She is followed  by Pam Gamble.   12/29/2017 Acute OV: Pt presents for acute OV. She states she has had worsening dyspnea and shortness of breath for over a week. She has had increased wheezing and she is much more short of breath with minimal exertion. She states she is complaint with her Anoro, which she states is no longer working. She is using her Duonebs 3 times daily.She states she is using her rescue inhaler 5 times daily. She states she is coughing up clear secretions, but states they are thick and hard to cough up.She states she has been doing her Mucinex DM , but is not drinking a full glass of water. She denies fever, chest pain, orthopnea or hemoptysis.   Test Results: 12/29/2017>> Mild cardiomegally. Probable COPD, No Acute pulmonary process  CBC Latest Ref Rng & Units 12/12/2016 07/12/2014 04/27/2013  WBC 4.0 - 10.5 K/uL 16.7(H) 17.4(H) 13.0(H)  Hemoglobin 12.0 - 15.0 g/dL 12.1 12.4 13.6  Hematocrit 36.0 - 46.0 % 38.3 38.3 41.0  Platelets 150.0 - 400.0 K/uL 439.0(H) 377.0 444.0(H)    BMP Latest Ref Rng & Units 09/16/2017 06/13/2017 03/13/2017  Glucose 70 - 99 mg/dL 122(H) 141(H) 466(H)  BUN 6 - 23 mg/dL 18 27(H) 17  Creatinine 0.40 - 1.20 mg/dL 0.91 1.02 0.97  Sodium 135 - 145 mEq/L 139 140 135  Potassium 3.5 - 5.1 mEq/L 4.6 3.9 4.9  Chloride 96 - 112 mEq/L 103 103 93(L)  CO2 19 - 32 mEq/L 27 28 32  Calcium 8.4 - 10.5 mg/dL 9.5 9.6 10.4    BNP No results found for: BNP  ProBNP    Component Value Date/Time   PROBNP 140.8 (H) 06/04/2012 0800    PFT No results found for: FEV1PRE, FEV1POST, FVCPRE, FVCPOST, TLC, DLCOUNC, PREFEV1FVCRT, PSTFEV1FVCRT  Dg Chest 2 View  Result Date: 12/29/2017 CLINICAL DATA:  Cough, congestion, shortness of breath and wheezing for 3 weeks, worsening for 2 days. History of COPD. EXAM: CHEST  2 VIEW COMPARISON:  Chest  radiograph September 25, 2016 FINDINGS: Cardiac silhouette is mildly enlarged. Calcified aortic knob. Mild chronic interstitial changes and increased lung volumes without pleural effusion or focal consolidation. Biapical pleuroparenchymal scarring. No pneumothorax. Osteopenia. Mild degenerative change of the thoracic spine. IMPRESSION: Mild cardiomegaly.  Probable COPD without acute pulmonary process. Electronically Signed   By: Pam Gamble M.D.   On: 12/29/2017 13:56     Past medical hx Past Medical History:  Diagnosis Date  . Barrett's esophagus   . Calcium deposits in tendon and bursa   . Chronic airway obstruction, not elsewhere classified   . COPD (chronic obstructive pulmonary disease) ([place])   . Depression   . Diabetes mellitus 2008ish   type two  . Esophageal reflux   . Idiopathic thrombocythemia   . Kidney stones   . Lateral epicondylitis  of elbow   . Personal history of malignant neoplasm of other parts of uterus   . Personal history of other diseases of digestive system   . Shortness of breath    upon exertion     Social History   Tobacco Use  . Smoking status: Former Smoker    Packs/day: 2.00    Years: 49.00    Pack years: 98.00    Types: Cigarettes    Last attempt to quit: 11/11/2005    Years since quitting:  12.1  . Smokeless tobacco: Never Used  Substance Use Topics  . Alcohol use: Yes    Alcohol/week: 0.0 oz    Comment: beer, whiskey, margherita  . Drug use: No    [name] reports that she quit smoking about 12 years ago. Her smoking use included cigarettes. She has a 98.00 pack-year smoking history. she has never used smokeless tobacco. She reports that she drinks alcohol. She reports that she does not use drugs.  Tobacco Cessation: Former smoker quit 2007, 98 pack year smoking history  Past surgical hx, Family hx, Social hx all reviewed.  Current Outpatient Medications on File Prior to Visit  Medication Sig  . albuterol (PROVENTIL HFA;VENTOLIN  HFA) 108 (90 BASE) MCG/ACT inhaler Inhale 2 puffs into the lungs every 6 (six) hours as needed for wheezing.  Marland Kitchen azithromycin (ZITHROMAX Z-PAK) 250 MG tablet As directed  . B Complex-C-Folic Acid (STRESS B COMPLEX PO) Take 1 capsule by mouth daily.  . Blood Glucose Monitoring Suppl (ONE TOUCH ULTRA SYSTEM KIT) w/Device KIT 1 kit by Does not apply route once.  . Cetirizine HCl (ZYRTEC PO) Take 1 tablet by mouth daily.  . cholecalciferol (VITAMIN D) 1000 UNITS tablet Take 5,000 Units by mouth daily.  . fluticasone (FLONASE) 50 MCG/ACT nasal spray Place 2 sprays into the nose daily as needed for rhinitis. Reported on 01/23/2016  . GINKGO BILOBA COMPLEX PO Take 1 capsule by mouth daily.  Marland Kitchen glucose blood (ONE TOUCH ULTRA TEST) test strip USE 1 STRIP TO CHECK GLUCOSE TWICE DAILY  . insulin NPH Human (HUMULIN N) 100 UNIT/ML injection Use 40 u in am and 10 u qhs. Titrate up by 1 unit a day each (AM and HS doses) for goal sugars of 100-130 up to 60 units in AM and 20 u at HS  . Insulin Syringe-Needle U-100 (B-D INS SYRINGE 0.5CC/31GX5/16) 31G X 5/16" 0.5 ML MISC Use qd  . loratadine (CLARITIN) 10 MG tablet Take 1 tablet (10 mg total) by mouth daily.  . metFORMIN (GLUCOPHAGE) 1000 MG tablet TAKE ONE TABLET BY MOUTH TWICE DAILY WITH MEALS  . omeprazole (PRILOSEC) 40 MG capsule Take 1 capsule (40 mg total) by mouth daily.  . ONE TOUCH LANCETS MISC 1 each by Does not apply route 2 (two) times daily.  . promethazine-codeine (PHENERGAN WITH CODEINE) 6.25-10 MG/5ML syrup Take 5 mLs by mouth every 4 (four) hours as needed.  Marland Kitchen tiZANidine (ZANAFLEX) 4 MG tablet Take 1 tablet (4 mg total) by mouth every 6 (six) hours as needed for muscle spasms.  . traMADol (ULTRAM) 50 MG tablet Take 1 tablet (50 mg total) by mouth every 8 (eight) hours as needed.  . umeclidinium-vilanterol (ANORO ELLIPTA) 62.5-25 MCG/INH AEPB Inhale 1 puff into the lungs daily.   Current Facility-Administered Medications on File Prior to Visit    Medication  . ipratropium-albuterol (DUONEB) 0.5-2.5 (3) MG/3ML nebulizer solution 3 mL     Allergies  Allergen Reactions  . Amoxicillin-Pot Clavulanate Nausea Only and Other (See Comments)    REACTION: diarrhea "sick to stomach"  . Ertugliflozin     Steglatro -  - "it made me feel funny"  . Levaquin [Levofloxacin] Nausea Only    Review Of Systems:  Constitutional:   No  weight loss, night sweats,  Fevers, chills, fatigue, or  lassitude.  HEENT:   No headaches,  Difficulty swallowing,  Tooth/dental problems, or  Sore throat,                No  sneezing, itching, ear ache, nasal congestion, post nasal drip,   CV:  No chest pain,  Orthopnea, PND, swelling in lower extremities, anasarca, dizziness, palpitations, syncope.   GI  No heartburn, indigestion, abdominal pain, nausea, vomiting, diarrhea, change in bowel habits, loss of appetite, bloody stools.   Resp: + shortness of breath with exertion less at rest.  + excess mucus, + productive cough,  No non-productive cough,  No coughing up of blood.  + change in color of mucus.  + wheezing.  No chest wall deformity  Skin: no rash or lesions.  GU: no dysuria, change in color of urine, no urgency or frequency.  No flank pain, no hematuria   MS:  No joint pain or swelling.  No decreased range of motion.  No back pain.  Psych:  No change in mood or affect. No depression or anxiety.  No memory loss.   Vital Signs BP (!) 156/80 (BP Location: Left Arm, Cuff Size: Normal)   Pulse (!) 114   Ht '5\' 2"'  (1.575 m)   Wt 167 lb 3.2 oz (75.8 kg)   SpO2 92%   BMI 30.58 kg/m    Physical Exam:  General- No distress,  A&Ox3, pleasant ENT: No sinus tenderness, TM clear, pale nasal mucosa, no oral exudate,no post nasal drip, no LAN Cardiac: S1, S2, regular rate and rhythm, no murmur Chest: + wheeze/ No rales/ dullness; + accessory muscle use, no nasal flaring, no sternal retractions Abd.: Soft Non-tender, non-distended, obese Ext: No  clubbing cyanosis,1+ BLE  edema Neuro:  normal strength Skin: No rashes, warm and dry Psych: normal mood and behavior   Assessment/Plan  C O P D WITH ACUTE EXACERBATION Flare Plan: CXR today We will call you with results. We will do a Xopenex breathing treatment now. Prednisone taper; 10 mg tablets: 4 tabs x 2 days, 3 tabs x 2 days, 2 tabs x 2 days 1 tab x 2 days then stop. Doxycycline 100 mg twice daily x 7 days. Continue Mucinex DM as you have been doing.  Make sure you drink a full glass of water with medication. Continue your Duonebs 3 times a day while you are this sick. Rinse mouth after use. We will renew your Duoneb prescription. Continue Anoro daily for now. We will re-evaluate your maintenance inhaler once you are over your flare. We will schedule PFT's when you are over your flare. Follow up in 2 weeks with Pam Gamble or Judson Roch NP. Please contact office for sooner follow up if symptoms do not improve or worsen or seek emergency care       [name], NP 12/29/2017  7:48 PM

## 2017-12-29 NOTE — Assessment & Plan Note (Signed)
Flare Plan: CXR today We will call you with results. We will do a Xopenex breathing treatment now. Prednisone taper; 10 mg tablets: 4 tabs x 2 days, 3 tabs x 2 days, 2 tabs x 2 days 1 tab x 2 days then stop. Doxycycline 100 mg twice daily x 7 days. Continue Mucinex DM as you have been doing.  Make sure you drink a full glass of water with medication. Continue your Duonebs 3 times a day while you are this sick. Rinse mouth after use. We will renew your Duoneb prescription. Continue Anoro daily for now. We will re-evaluate your maintenance inhaler once you are over your flare. We will schedule PFT's when you are over your flare. Follow up in 2 weeks with Dr. Vaughan Browner or Judson Roch NP. Please contact office for sooner follow up if symptoms do not improve or worsen or seek emergency care

## 2017-12-30 ENCOUNTER — Telehealth: Payer: Self-pay | Admitting: Acute Care

## 2017-12-30 NOTE — Telephone Encounter (Signed)
Notes recorded by [name], NP on 12/29/2017 at 7:45 PM EST Call patient and let her know her CXR did not indicate pneumonia. Please have her follow the plan of care and follow up per the office visit today. Thanks so much.       Advised pt of results. Pt understood and nothing further is needed.

## 2018-01-12 ENCOUNTER — Ambulatory Visit: Payer: Medicare HMO | Admitting: Acute Care

## 2018-01-12 ENCOUNTER — Encounter: Payer: Self-pay | Admitting: Acute Care

## 2018-01-12 VITALS — BP 132/68 | HR 105 | Ht 62.0 in | Wt 166.8 lb

## 2018-01-12 DIAGNOSIS — R079 Chest pain, unspecified: Secondary | ICD-10-CM

## 2018-01-12 DIAGNOSIS — J441 Chronic obstructive pulmonary disease with (acute) exacerbation: Secondary | ICD-10-CM | POA: Diagnosis not present

## 2018-01-12 MED ORDER — FLUTICASONE-UMECLIDIN-VILANT 100-62.5-25 MCG/INH IN AEPB: 1.0000 | INHALATION_SPRAY | Freq: Every day | RESPIRATORY_TRACT | 0 refills | Status: DC

## 2018-01-12 NOTE — Patient Instructions (Addendum)
It is good to see you.  I Am glad you are feeling better. We will do a therapeutic trial of Trelegy. We will give you a sample. 1 puff once daily. Do not not take the Anoro while on Trelegy. Rinse mouth after use. Continue using your Rescue inhaler as needed for breakthrough shortness of breath. Continue using your nebulizer as needed for shortness of breath or wheezing. We will schedule you for a PFT after we see how you do on the trelegy. We will refer you to pulmonary rehab at [facility]( Dr. Vaughan Browner) We will refer you [facility] cardiology for evaluation of your chest pain in the cold weather. Follow up in 4 weeks to evaluate Trelegy Please contact office for sooner follow up if symptoms do not improve or worsen or seek emergency care

## 2018-01-12 NOTE — Progress Notes (Signed)
History of Present Illness Pam Gamble is a 72 y.o. female 72 y.o. female former smoker ( 50 pack year history quit 2007) with COPD. She is followed  by Pam Gamble.   01/12/2018 2 week follow up for COPD Flare: Pt. Presents for follow up. She was seen 12/29/2017 with COPD flare. Plan after that visit was as follows:  C O P D WITH ACUTE EXACERBATION Flare Plan: CXR today We will call you with results. We will do a Xopenex breathing treatment now. Prednisone taper; 10 mg tablets: 4 tabs x 2 days, 3 tabs x 2 days, 2 tabs x 2 days 1 tab x 2 days then stop. Doxycycline 100 mg twice daily x 7 days. Continue Mucinex DM as you have been doing.  Make sure you drink a full glass of water with medication. Continue your Duonebs 3 times a day while you are this sick. Rinse mouth after use. We will renew your Duoneb prescription. Continue Anoro daily for now. We will re-evaluate your maintenance inhaler once you are over your flare. We will schedule PFT's when you are over your flare. Follow up in 2 weeks with Pam Gamble or Pam Roch NP. Please contact office for sooner follow up if symptoms do not improve or worsen or seek emergency care    Pt. Returns 01/12/2018 endorsing improvement in symptoms. She states she was compliant with her Doxycycline and prednisone tapers. She states she did have some nausea with the medication.She endorses compliance with her Anoro and Duonebs.  She states she is feeling better.Just a rare cough, clear mucus.  She states she is still short of breath with exertion. She states she has not been able to hunt this year due to chest pain she has only when it is cold. She states she does not have the chest pain at any other time, and that she does not have chest pain now. She has not been worked up from a cardiac perspective.She denies fever, chest pain ( except in the cold weather) orthopnea or hemoptysis.She is better and has returned to her baseline status.  Test Results: CXR  12/29/2017 Mild cardiomegaly.  Probable COPD without acute pulmonary process.  CBC Latest Ref Rng & Units 12/12/2016 07/12/2014 04/27/2013  WBC 4.0 - 10.5 K/uL 16.7(H) 17.4(H) 13.0(H)  Hemoglobin 12.0 - 15.0 g/dL 12.1 12.4 13.6  Hematocrit 36.0 - 46.0 % 38.3 38.3 41.0  Platelets 150.0 - 400.0 K/uL 439.0(H) 377.0 444.0(H)    BMP Latest Ref Rng & Units 09/16/2017 06/13/2017 03/13/2017  Glucose 70 - 99 mg/dL 122(H) 141(H) 466(H)  BUN 6 - 23 mg/dL 18 27(H) 17  Creatinine 0.40 - 1.20 mg/dL 0.91 1.02 0.97  Sodium 135 - 145 mEq/L 139 140 135  Potassium 3.5 - 5.1 mEq/L 4.6 3.9 4.9  Chloride 96 - 112 mEq/L 103 103 93(L)  CO2 19 - 32 mEq/L 27 28 32  Calcium 8.4 - 10.5 mg/dL 9.5 9.6 10.4    BNP No results found for: BNP  ProBNP    Component Value Date/Time   PROBNP 140.8 (H) 06/04/2012 0800    PFT No results found for: FEV1PRE, FEV1POST, FVCPRE, FVCPOST, TLC, DLCOUNC, PREFEV1FVCRT, PSTFEV1FVCRT  Dg Chest 2 View  Result Date: 12/29/2017 CLINICAL DATA:  Cough, congestion, shortness of breath and wheezing for 3 weeks, worsening for 2 days. History of COPD. EXAM: CHEST  2 VIEW COMPARISON:  Chest radiograph September 25, 2016 FINDINGS: Cardiac silhouette is mildly enlarged. Calcified aortic knob. Mild chronic interstitial changes and increased  lung volumes without pleural effusion or focal consolidation. Biapical pleuroparenchymal scarring. No pneumothorax. Osteopenia. Mild degenerative change of the thoracic spine. IMPRESSION: Mild cardiomegaly.  Probable COPD without acute pulmonary process. Electronically Signed   By: Elon Alas M.D.   On: 12/29/2017 13:56     Past medical hx Past Medical History:  Diagnosis Date  . Barrett's esophagus   . Calcium deposits in tendon and bursa   . Chronic airway obstruction, not elsewhere classified   . COPD (chronic obstructive pulmonary disease) ([place])   . Depression   . Diabetes mellitus 2008ish   type two  . Esophageal reflux   . Idiopathic  thrombocythemia   . Kidney stones   . Lateral epicondylitis  of elbow   . Personal history of malignant neoplasm of other parts of uterus   . Personal history of other diseases of digestive system   . Shortness of breath    upon exertion     Social History   Tobacco Use  . Smoking status: Former Smoker    Packs/day: 2.00    Years: 49.00    Pack years: 98.00    Types: Cigarettes    Last attempt to quit: 11/11/2005    Years since quitting: 12.1  . Smokeless tobacco: Never Used  Substance Use Topics  . Alcohol use: Yes    Alcohol/week: 0.0 oz    Comment: beer, whiskey, margherita  . Drug use: No    [name] reports that she quit smoking about 12 years ago. Her smoking use included cigarettes. She has a 98.00 pack-year smoking history. she has never used smokeless tobacco. She reports that she drinks alcohol. She reports that she does not use drugs.  Tobacco Cessation: Former smoker quit 2007, 98 pack year smoking history  Past surgical hx, Family hx, Social hx all reviewed.  Current Outpatient Medications on File Prior to Visit  Medication Sig  . albuterol (PROVENTIL HFA;VENTOLIN HFA) 108 (90 BASE) MCG/ACT inhaler Inhale 2 puffs into the lungs every 6 (six) hours as needed for wheezing.  Marland Kitchen azithromycin (ZITHROMAX Z-PAK) 250 MG tablet As directed  . B Complex-C-Folic Acid (STRESS B COMPLEX PO) Take 1 capsule by mouth daily.  . Blood Glucose Monitoring Suppl (ONE TOUCH ULTRA SYSTEM KIT) w/Device KIT 1 kit by Does not apply route once.  . Cetirizine HCl (ZYRTEC PO) Take 1 tablet by mouth daily.  . cholecalciferol (VITAMIN D) 1000 UNITS tablet Take 5,000 Units by mouth daily.  Marland Kitchen doxycycline (VIBRA-TABS) 100 MG tablet Take 1 tablet (100 mg total) by mouth 2 (two) times daily.  . fluticasone (FLONASE) 50 MCG/ACT nasal spray Place 2 sprays into the nose daily as needed for rhinitis. Reported on 01/23/2016  . GINKGO BILOBA COMPLEX PO Take 1 capsule by mouth daily.  Marland Kitchen glucose blood (ONE  TOUCH ULTRA TEST) test strip USE 1 STRIP TO CHECK GLUCOSE TWICE DAILY  . insulin NPH Human (HUMULIN N) 100 UNIT/ML injection Use 40 u in am and 10 u qhs. Titrate up by 1 unit a day each (AM and HS doses) for goal sugars of 100-130 up to 60 units in AM and 20 u at HS  . Insulin Syringe-Needle U-100 (B-D INS SYRINGE 0.5CC/31GX5/16) 31G X 5/16" 0.5 ML MISC Use qd  . ipratropium-albuterol (DUONEB) 0.5-2.5 (3) MG/3ML SOLN Inhale 3 mLs into the lungs as needed.  . loratadine (CLARITIN) 10 MG tablet Take 1 tablet (10 mg total) by mouth daily.  . metFORMIN (GLUCOPHAGE) 1000 MG tablet TAKE ONE TABLET  BY MOUTH TWICE DAILY WITH MEALS  . omeprazole (PRILOSEC) 40 MG capsule Take 1 capsule (40 mg total) by mouth daily.  . ONE TOUCH LANCETS MISC 1 each by Does not apply route 2 (two) times daily.  . predniSONE (DELTASONE) 10 MG tablet Take 4 tabs for 2 days, then 3 tabs for 2 days, 2 tabs for 2 days, then 1 tab for 2 days, then stop.  . promethazine-codeine (PHENERGAN WITH CODEINE) 6.25-10 MG/5ML syrup Take 5 mLs by mouth every 4 (four) hours as needed.  Marland Kitchen tiZANidine (ZANAFLEX) 4 MG tablet Take 1 tablet (4 mg total) by mouth every 6 (six) hours as needed for muscle spasms.  . traMADol (ULTRAM) 50 MG tablet Take 1 tablet (50 mg total) by mouth every 8 (eight) hours as needed.  . umeclidinium-vilanterol (ANORO ELLIPTA) 62.5-25 MCG/INH AEPB Inhale 1 puff into the lungs daily.   Current Facility-Administered Medications on File Prior to Visit  Medication  . ipratropium-albuterol (DUONEB) 0.5-2.5 (3) MG/3ML nebulizer solution 3 mL     Allergies  Allergen Reactions  . Amoxicillin-Pot Clavulanate Nausea Only and Other (See Comments)    REACTION: diarrhea "sick to stomach"  . Ertugliflozin     Steglatro -  - "it made me feel funny"  . Levaquin [Levofloxacin] Nausea Only    Review Of Systems:  Constitutional:   No  weight loss, night sweats,  Fevers, chills, fatigue, or  lassitude.  HEENT:   No headaches,   Difficulty swallowing,  Tooth/dental problems, or  Sore throat,                No sneezing, itching, ear ache, nasal congestion, post nasal drip,   CV:  +  chest pain in cold weather only,  No Orthopnea, PND, swelling in lower extremities, anasarca, dizziness, palpitations, syncope.   GI  No heartburn, indigestion, abdominal pain, nausea, vomiting, diarrhea, change in bowel habits, loss of appetite, bloody stools.   Resp: + shortness of breath with exertion less at rest.  No excess mucus, no productive cough,  No non-productive cough,  No coughing up of blood.  No change in color of mucus.  No wheezing.  No chest wall deformity  Skin: no rash or lesions.  GU: no dysuria, change in color of urine, no urgency or frequency.  No flank pain, no hematuria   MS:  No joint pain or swelling.  No decreased range of motion.  No back pain.  Psych:  No change in mood or affect. No depression or anxiety.  No memory loss.   Vital Signs BP 132/68 (BP Location: Left Arm, Cuff Size: Normal)   Pulse (!) 105   Ht '5\' 2"'$  (1.575 m)   Wt 166 lb 12.8 oz (75.7 kg)   SpO2 97%   BMI 30.51 kg/m    Physical Exam:  General- No distress,  A&Ox3, pleasant ENT: No sinus tenderness, TM clear, pale nasal mucosa, no oral exudate,no post nasal drip, no LAN Cardiac: S1, S2, regular rate and rhythm, no murmur Chest: No wheeze/ rales/ dullness; no accessory muscle use, no nasal flaring, no sternal retractions, prolonged expiratory phase. Abd.: Soft Non-tender, ND, obese Ext: No clubbing cyanosis, edema Neuro:  normal strength, MAE x 4, A&O x 3 Skin: No rashes, warm and dry Psych: normal mood and behavior   Assessment/Plan  C O P D WITH ACUTE EXACERBATION Resolved flare Plan: I Am glad you are feeling better. We will do a therapeutic trial of Trelegy. We will give you  a sample. 1 puff once daily. Do not not take the Anoro while on Trelegy. Rinse mouth after use. Continue using your Rescue inhaler as needed  for breakthrough shortness of breath. Continue using your nebulizer as needed for shortness of breath or wheezing. We will schedule you for a PFT after we see how you do on the trelegy. We will refer you to pulmonary rehab at [facility]( Pam Gamble) We will refer you [facility] cardiology for evaluation of your chest pain in the cold weather. Follow up in 4 weeks to evaluate Trelegy Please contact office for sooner follow up if symptoms do not improve or worsen or seek emergency care       [name], NP 01/12/2018  4:02 PM

## 2018-01-12 NOTE — Assessment & Plan Note (Signed)
Resolved flare Plan: I Am glad you are feeling better. We will do a therapeutic trial of Trelegy. We will give you a sample. 1 puff once daily. Do not not take the Anoro while on Trelegy. Rinse mouth after use. Continue using your Rescue inhaler as needed for breakthrough shortness of breath. Continue using your nebulizer as needed for shortness of breath or wheezing. We will schedule you for a PFT after we see how you do on the trelegy. We will refer you to pulmonary rehab at [facility]( Dr. Vaughan Browner) We will refer you [facility] cardiology for evaluation of your chest pain in the cold weather. Follow up in 4 weeks to evaluate Trelegy Please contact office for sooner follow up if symptoms do not improve or worsen or seek emergency care

## 2018-01-29 ENCOUNTER — Encounter: Payer: Self-pay | Admitting: Cardiology

## 2018-01-29 ENCOUNTER — Ambulatory Visit: Payer: Medicare HMO | Admitting: Cardiology

## 2018-01-29 VITALS — BP 136/72 | HR 92 | Ht 62.0 in | Wt 165.4 lb

## 2018-01-29 DIAGNOSIS — E785 Hyperlipidemia, unspecified: Secondary | ICD-10-CM

## 2018-01-29 DIAGNOSIS — R072 Precordial pain: Secondary | ICD-10-CM

## 2018-01-29 DIAGNOSIS — I10 Essential (primary) hypertension: Secondary | ICD-10-CM | POA: Diagnosis not present

## 2018-01-29 MED ORDER — METOPROLOL TARTRATE 50 MG PO TABS: 50.0000 mg | ORAL_TABLET | Freq: Once | ORAL | 0 refills | Status: DC

## 2018-01-29 MED ORDER — ROSUVASTATIN CALCIUM 5 MG PO TABS: 5.0000 mg | ORAL_TABLET | Freq: Every day | ORAL | 3 refills | Status: DC

## 2018-01-29 NOTE — Progress Notes (Signed)
Cardiology Office Note    Date:  01/29/2018   ID:  Pam Gamble 1946/01/21, MRN [id number]  PCP:  Cassandria Anger, MD  Cardiologist:  Ena Dawley, MD   Chief complain: chest pain  History of Present Illness:  Pam Gamble is a 72 y.o. female with prior medical history of hyperlipidemia, hypertension and diabetes, who is coming for concern of chest pain. She states that chest pains always come in winter when she walks while hunting and fishing. It resolves at rest. She has COPD due to long-term smohe quit in 2007 and every winter should also get acute assessment patient requiring steroids and antibiotics. She denies any palpitations dizziness or syncope no lower extremity edema claudications.her father had a myocardial infarction at age 70, her mom had CHF but lived to be 48 years old.   Past Medical History:  Diagnosis Date  . Barrett's esophagus   . Calcium deposits in tendon and bursa   . Chronic airway obstruction, not elsewhere classified   . COPD (chronic obstructive pulmonary disease) ([place])   . Depression   . Diabetes mellitus 2008ish   type two  . Esophageal reflux   . Idiopathic thrombocythemia   . Kidney stones   . Lateral epicondylitis  of elbow   . Personal history of malignant neoplasm of other parts of uterus   . Personal history of other diseases of digestive system   . Shortness of breath    upon exertion    Past Surgical History:  Procedure Laterality Date  . ABDOMINAL HYSTERECTOMY    . CATARACT EXTRACTION Bilateral 01/2016 and 03/2016  . LITHOTRIPSY    . REFRACTIVE SURGERY Right 05/16/2016  . SPLENECTOMY  1999  . TONSILLECTOMY AND ADENOIDECTOMY      Current Medications: Outpatient Medications Prior to Visit  Medication Sig Dispense Refill  . albuterol (PROVENTIL HFA;VENTOLIN HFA) 108 (90 BASE) MCG/ACT inhaler Inhale 2 puffs into the lungs every 6 (six) hours as needed for wheezing. 1 Inhaler 5  . B Complex-C-Folic Acid (STRESS B COMPLEX  PO) Take 1 capsule by mouth daily.    . Blood Glucose Monitoring Suppl (ONE TOUCH ULTRA SYSTEM KIT) w/Device KIT 1 kit by Does not apply route once. 1 each 0  . Cetirizine HCl (ZYRTEC PO) Take 1 tablet by mouth daily.    . cholecalciferol (VITAMIN D) 1000 UNITS tablet Take 5,000 Units by mouth daily.    . fluticasone (FLONASE) 50 MCG/ACT nasal spray Place 2 sprays into the nose daily as needed for rhinitis. Reported on 01/23/2016    . Fluticasone-Umeclidin-Vilant (TRELEGY ELLIPTA) 100-62.5-25 MCG/INH AEPB Inhale 1 puff into the lungs daily. 1 each 0  . GINKGO BILOBA COMPLEX PO Take 1 capsule by mouth daily.    Marland Kitchen glucose blood (ONE TOUCH ULTRA TEST) test strip USE 1 STRIP TO CHECK GLUCOSE TWICE DAILY 200 each 1  . insulin NPH Human (HUMULIN N) 100 UNIT/ML injection Use 40 u in am and 10 u qhs. Titrate up by 1 unit a day each (AM and HS doses) for goal sugars of 100-130 up to 60 units in AM and 20 u at HS 10 mL 11  . Insulin Syringe-Needle U-100 (B-D INS SYRINGE 0.5CC/31GX5/16) 31G X 5/16" 0.5 ML MISC Use qd 100 each 3  . ipratropium-albuterol (DUONEB) 0.5-2.5 (3) MG/3ML SOLN Inhale 3 mLs into the lungs as needed. 360 mL 1  . loratadine (CLARITIN) 10 MG tablet Take 1 tablet (10 mg total) by mouth daily. 100  tablet 3  . metFORMIN (GLUCOPHAGE) 1000 MG tablet TAKE ONE TABLET BY MOUTH TWICE DAILY WITH MEALS 180 tablet 3  . omeprazole (PRILOSEC) 40 MG capsule Take 1 capsule (40 mg total) by mouth daily. 90 capsule 3  . ONE TOUCH LANCETS MISC 1 each by Does not apply route 2 (two) times daily. 100 each 3  . tiZANidine (ZANAFLEX) 4 MG tablet Take 1 tablet (4 mg total) by mouth every 6 (six) hours as needed for muscle spasms. 40 tablet 0  . traMADol (ULTRAM) 50 MG tablet Take 1 tablet (50 mg total) by mouth every 8 (eight) hours as needed. 50 tablet 0  . umeclidinium-vilanterol (ANORO ELLIPTA) 62.5-25 MCG/INH AEPB Inhale 1 puff into the lungs daily. (Patient not taking: Reported on 01/29/2018) 1 each 11  .  azithromycin (ZITHROMAX Z-PAK) 250 MG tablet As directed (Patient not taking: Reported on 01/29/2018) 6 tablet 1  . doxycycline (VIBRA-TABS) 100 MG tablet Take 1 tablet (100 mg total) by mouth 2 (two) times daily. (Patient not taking: Reported on 01/29/2018) 14 tablet 0  . predniSONE (DELTASONE) 10 MG tablet Take 4 tabs for 2 days, then 3 tabs for 2 days, 2 tabs for 2 days, then 1 tab for 2 days, then stop. (Patient not taking: Reported on 01/29/2018) 20 tablet 0  . promethazine-codeine (PHENERGAN WITH CODEINE) 6.25-10 MG/5ML syrup Take 5 mLs by mouth every 4 (four) hours as needed. (Patient not taking: Reported on 01/29/2018) 300 mL 0   Facility-Administered Medications Prior to Visit  Medication Dose Route Frequency Provider Last Rate Last Dose  . ipratropium-albuterol (DUONEB) 0.5-2.5 (3) MG/3ML nebulizer solution 3 mL  3 mL Nebulization Once Nche, Charlene Brooke, NP         Allergies:   Amoxicillin-pot clavulanate; Ertugliflozin; and Levaquin [levofloxacin]   Social History   Socioeconomic History  . Marital status: Single    Spouse name: Not on file  . Number of children: 0  . Years of education: Not on file  . Highest education level: Not on file  Occupational History  . Occupation: retired    Fish farm manager: Aeronautical engineer  Social Needs  . Financial resource strain: Not on file  . Food insecurity:    Worry: Not on file    Inability: Not on file  . Transportation needs:    Medical: Not on file    Non-medical: Not on file  Tobacco Use  . Smoking status: Former Smoker    Packs/day: 2.00    Years: 49.00    Pack years: 98.00    Types: Cigarettes    Last attempt to quit: 11/11/2005    Years since quitting: 12.2  . Smokeless tobacco: Never Used  Substance and Sexual Activity  . Alcohol use: Yes    Alcohol/week: 0.0 oz    Comment: beer, whiskey, margherita  . Drug use: No  . Sexual activity: Not Currently  Lifestyle  . Physical activity:    Days per week: Not on file    Minutes  per session: Not on file  . Stress: Not on file  Relationships  . Social connections:    Talks on phone: Not on file    Gets together: Not on file    Attends religious service: Not on file    Active member of club or organization: Not on file    Attends meetings of clubs or organizations: Not on file    Relationship status: Not on file  Other Topics Concern  . Not on file  Social History Narrative   Retired - Chief Operating Officer   Lives alone   Single     Family History:  The patient's family history includes Diabetes in her father, maternal aunt, and sister; Heart disease in her father and mother; Liver cancer in her sister; Pancreatic cancer in her sister; Stroke in her mother.   ROS:   Please see the history of present illness.    ROS All other systems reviewed and are negative.   PHYSICAL EXAM:   VS:  BP 136/72 (BP Location: Right Arm, Patient Position: Sitting, Cuff Size: Normal)   Pulse 92   Ht _0  (1.575 m)   Wt 165 lb 6.4 oz (75 kg)   BMI 30.25 kg/m    GEN: Well nourished, well developed, in no acute distress  HEENT: normal  Neck: no JVD, carotid bruits, or masses Cardiac: RRR; no murmurs, rubs, or gallops,no edema  Respiratory:  clear to auscultation bilaterally, normal work of breathing GI: soft, nontender, nondistended, + BS MS: no deformity or atrophy  Skin: warm and dry, no rash Neuro:  Alert and Oriented x 3, Strength and sensation are intact Psych: euthymic mood, full affect  Wt Readings from Last 3 Encounters:  01/29/18 165 lb 6.4 oz (75 kg)  01/12/18 166 lb 12.8 oz (75.7 kg)  12/29/17 167 lb 3.2 oz (75.8 kg)      Studies/Labs Reviewed:   EKG:  EKG is ordered today.  The ekg ordered today demonstrates normal sinus rhythm, 92 bpm, negative T waves in the lateral leads suggestive of ischemia.  Recent Labs: 09/16/2017: BUN 18; Creatinine, Ser 0.91; Potassium 4.6; Sodium 139   Lipid Panel    Component Value Date/Time   CHOL 228 (H)  12/12/2016 0853   TRIG 114.0 12/12/2016 0853   HDL 52.60 12/12/2016 0853   CHOLHDL 4 12/12/2016 0853   VLDL 22.8 12/12/2016 0853   LDLCALC 153 (H) 12/12/2016 0853   LDLDIRECT 160.1 07/12/2014 1423    Additional studies/ records that were reviewed today include:     ASSESSMENT:    1. Precordial pain   2. Essential hypertension   3. Hyperlipidemia, unspecified hyperlipidemia type      PLAN:  In order of problems listed above:  Typical chest pain, suspicious for coronary artery disease, risk factors include hypertension hyperlipidemia and diabetes and long-term smoking as well as family history of coronary artery disease.her EKG is suspicious  Lateral ischemia. We will order coronary CTA.  Hypertension - well controlled  Hyperlipidemia - LDL of 153, goal less than 70 as she is insulin-dependent diabetic, she has never been placed on statin before. We will start rosuvastatin 5 mg daily and check LFTs and lipids in 2 months.   Medication Adjustments/Labs and Tests Ordered: Current medicines are reviewed at length with the patient today.  Concerns regarding medicines are outlined above.  Medication changes, Labs and Tests ordered today are listed in the Patient Instructions below. There are no Patient Instructions on file for this visit.   Signed, Ena Dawley, MD  01/29/2018 10:21 AM    [address], [place], [place]  [address] Phone: [phone]; Fax: [phone]

## 2018-01-29 NOTE — Patient Instructions (Signed)
Medication Instructions:   START TAKING ROSUVASTATIN 5 MG ONCE DAILY   Labwork:  2 MONTHS SAME DAY AS YOU COME FOR YOUR 2 MONTH FOLLOW-UP APPOINTMENT WITH THE PA/NP--WILL CHECK LIPIDS AND LFTs---PLEASE COME FASTING TO THIS LAB APPOINTMENT    Testing/Procedures:   CORONARY CT  Please arrive at the [facility] main entrance of [facility] at xx:xx AM (30-45 minutes prior to test start time)  [facility] [place], [place] [address] [phone]  Proceed to the [facility] Radiology Department (First Floor).  Please follow these instructions carefully (unless otherwise directed):   On the Night Before the Test: . Drink plenty of water. . Do not consume any caffeinated/decaffeinated beverages or chocolate 12 hours prior to your test. . Do not take any antihistamines 12 hours prior to your test. . If you take Metformin do not take 24 hours prior to test.   On the Day of the Test: . Drink plenty of water. Do not drink any water within one hour of the test. . Do not eat any food 4 hours prior to the test. . You may take your regular medications prior to the test. . IF NOT ON A BETA BLOCKER - Take 50 mg of lopressor (metoprolol) one hour before the test.   After the Test: . Drink plenty of water. . After receiving IV contrast, you may experience a mild flushed feeling. This is normal. . On occasion, you may experience a mild rash up to 24 hours after the test. This is not dangerous. If this occurs, you can take Benadryl 25 mg and increase your fluid intake. . If you experience trouble breathing, this can be serious. If it is severe call 911 IMMEDIATELY. If it is mild, please call our office. . If you take any of these medications: Glipizide/Metformin, Avandament, Glucavance, please do not take 48 hours after completing test.    Follow-Up:  2 MONTHS WITH BRITTANY SIMMONS PA-C OR AN EXTENDER ON DR NELSON'S TEAM---YOU WILL ALSO HAVE LAB DONE  SAME DAY      If you need a refill on your cardiac medications before your next appointment, please call your pharmacy.

## 2018-02-09 ENCOUNTER — Encounter: Payer: Self-pay | Admitting: Acute Care

## 2018-02-09 ENCOUNTER — Ambulatory Visit: Payer: Medicare HMO | Admitting: Acute Care

## 2018-02-09 DIAGNOSIS — J449 Chronic obstructive pulmonary disease, unspecified: Secondary | ICD-10-CM | POA: Insufficient documentation

## 2018-02-09 MED ORDER — ALBUTEROL SULFATE HFA 108 (90 BASE) MCG/ACT IN AERS: 2.0000 | INHALATION_SPRAY | Freq: Four times a day (QID) | RESPIRATORY_TRACT | 5 refills | Status: DC | PRN

## 2018-02-09 MED ORDER — FLUTICASONE-UMECLIDIN-VILANT 100-62.5-25 MCG/INH IN AEPB: 1.0000 | INHALATION_SPRAY | Freq: Every day | RESPIRATORY_TRACT | 5 refills | Status: AC

## 2018-02-09 NOTE — Progress Notes (Signed)
History of Present Illness Pam Gamble is a 72 y.o. female with former smoker ( 27 pack year history quit 2007)withCOPD. She isfollowedby Pam Gamble.   02/09/2018 Pt. Presents for follow up. She had a recent flare  01/12/2018 which has resolved. She is back for evaluation of a therapeutic trial of Trelegy.She states she does like the Trelegy and she would like to start on the Trelegy.It will allow her to be away from home for longer periods of time and this allows her better quality of life. She states she has been doing well. She has been using her IS to improve the strength of her breathing muscles. She would like to do Pulmonary Rehab , but she cannot afford it. I have provided her with information on the [facility] [place]. She denies cough wheezing or change in amount or color of secretions.She states she is at her COPD  baseline . States her breathing has not been this good in awhile. She denies fever, chest pain, orthopnea or hemoptysis.  Test Results:  CBC Latest Ref Rng & Units 12/12/2016 07/12/2014 04/27/2013  WBC 4.0 - 10.5 K/uL 16.7(H) 17.4(H) 13.0(H)  Hemoglobin 12.0 - 15.0 g/dL 12.1 12.4 13.6  Hematocrit 36.0 - 46.0 % 38.3 38.3 41.0  Platelets 150.0 - 400.0 K/uL 439.0(H) 377.0 444.0(H)    BMP Latest Ref Rng & Units 09/16/2017 06/13/2017 03/13/2017  Glucose 70 - 99 mg/dL 122(H) 141(H) 466(H)  BUN 6 - 23 mg/dL 18 27(H) 17  Creatinine 0.40 - 1.20 mg/dL 0.91 1.02 0.97  Sodium 135 - 145 mEq/L 139 140 135  Potassium 3.5 - 5.1 mEq/L 4.6 3.9 4.9  Chloride 96 - 112 mEq/L 103 103 93(L)  CO2 19 - 32 mEq/L 27 28 32  Calcium 8.4 - 10.5 mg/dL 9.5 9.6 10.4    BNP No results found for: BNP  ProBNP    Component Value Date/Time   PROBNP 140.8 (H) 06/04/2012 0800    PFT No results found for: FEV1PRE, FEV1POST, FVCPRE, FVCPOST, TLC, DLCOUNC, PREFEV1FVCRT, PSTFEV1FVCRT  No results found.   Past medical hx Past Medical History:  Diagnosis Date  . Barrett's esophagus   .  Calcium deposits in tendon and bursa   . Chronic airway obstruction, not elsewhere classified   . COPD (chronic obstructive pulmonary disease) ([place])   . Depression   . Diabetes mellitus 2008ish   type two  . Esophageal reflux   . Idiopathic thrombocythemia   . Kidney stones   . Lateral epicondylitis  of elbow   . Personal history of malignant neoplasm of other parts of uterus   . Personal history of other diseases of digestive system   . Shortness of breath    upon exertion     Social History   Tobacco Use  . Smoking status: Former Smoker    Packs/day: 2.00    Years: 49.00    Pack years: 98.00    Types: Cigarettes    Last attempt to quit: 11/11/2005    Years since quitting: 12.2  . Smokeless tobacco: Never Used  Substance Use Topics  . Alcohol use: Yes    Alcohol/week: 0.0 oz    Comment: beer, whiskey, margherita  . Drug use: No    PamGamble reports that she quit smoking about 12 years ago. Her smoking use included cigarettes. She has a 98.00 pack-year smoking history. She has never used smokeless tobacco. She reports that she drinks alcohol. She reports that she does not use drugs.  Tobacco Cessation:  Former smoker , quit 2007.  Past surgical hx, Family hx, Social hx all reviewed.  Current Outpatient Medications on File Prior to Visit  Medication Sig  . albuterol (PROVENTIL HFA;VENTOLIN HFA) 108 (90 BASE) MCG/ACT inhaler Inhale 2 puffs into the lungs every 6 (six) hours as needed for wheezing.  . B Complex-C-Folic Acid (STRESS B COMPLEX PO) Take 1 capsule by mouth daily.  . Blood Glucose Monitoring Suppl (ONE TOUCH ULTRA SYSTEM KIT) w/Device KIT 1 kit by Does not apply route once.  . Cetirizine HCl (ZYRTEC PO) Take 1 tablet by mouth daily.  . cholecalciferol (VITAMIN D) 1000 UNITS tablet Take 5,000 Units by mouth daily.  . fluticasone (FLONASE) 50 MCG/ACT nasal spray Place 2 sprays into the nose daily as needed for rhinitis. Reported on 01/23/2016  . GINKGO BILOBA COMPLEX  PO Take 1 capsule by mouth daily.  Marland Kitchen glucose blood (ONE TOUCH ULTRA TEST) test strip USE 1 STRIP TO CHECK GLUCOSE TWICE DAILY  . insulin NPH Human (HUMULIN N) 100 UNIT/ML injection Use 40 u in am and 10 u qhs. Titrate up by 1 unit a day each (AM and HS doses) for goal sugars of 100-130 up to 60 units in AM and 20 u at HS  . Insulin Syringe-Needle U-100 (B-D INS SYRINGE 0.5CC/31GX5/16) 31G X 5/16" 0.5 ML MISC Use qd  . ipratropium-albuterol (DUONEB) 0.5-2.5 (3) MG/3ML SOLN Inhale 3 mLs into the lungs as needed.  . loratadine (CLARITIN) 10 MG tablet Take 1 tablet (10 mg total) by mouth daily.  . metFORMIN (GLUCOPHAGE) 1000 MG tablet TAKE ONE TABLET BY MOUTH TWICE DAILY WITH MEALS  . omeprazole (PRILOSEC) 40 MG capsule Take 1 capsule (40 mg total) by mouth daily.  . ONE TOUCH LANCETS MISC 1 each by Does not apply route 2 (two) times daily.  . rosuvastatin (CRESTOR) 5 MG tablet Take 1 tablet (5 mg total) by mouth daily.  Marland Kitchen tiZANidine (ZANAFLEX) 4 MG tablet Take 1 tablet (4 mg total) by mouth every 6 (six) hours as needed for muscle spasms.  . traMADol (ULTRAM) 50 MG tablet Take 1 tablet (50 mg total) by mouth every 8 (eight) hours as needed.  . metoprolol tartrate (LOPRESSOR) 50 MG tablet Take 1 tablet (50 mg total) by mouth once for 1 dose. Take 1 hour prior to your coronary CT.   Current Facility-Administered Medications on File Prior to Visit  Medication  . ipratropium-albuterol (DUONEB) 0.5-2.5 (3) MG/3ML nebulizer solution 3 mL     Allergies  Allergen Reactions  . Amoxicillin-Pot Clavulanate Nausea Only and Other (See Comments)    REACTION: diarrhea "sick to stomach"  . Ertugliflozin     Steglatro -  - "it made me feel funny"  . Levaquin [Levofloxacin] Nausea Only    Review Of Systems:  Constitutional:   No  weight loss, night sweats,  Fevers, chills, fatigue, or  lassitude.  HEENT:   No headaches,  Difficulty swallowing,  Tooth/dental problems, or  Sore throat,                No  sneezing, itching, ear ache, nasal congestion, post nasal drip,   CV:  No chest pain,  Orthopnea, PND, swelling in lower extremities, anasarca, dizziness, palpitations, syncope.   GI  No heartburn, indigestion, abdominal pain, nausea, vomiting, diarrhea, change in bowel habits, loss of appetite, bloody stools.   Resp: + shortness of breath with exertion less at rest.  Baseline  excess mucus, Baseline  productive cough,  No non-productive  cough,  No coughing up of blood.  No change in color of mucus.  Rare  wheezing.  No chest wall deformity  Skin: no rash or lesions.  GU: no dysuria, change in color of urine, no urgency or frequency.  No flank pain, no hematuria   MS:  No joint pain or swelling.  No decreased range of motion.  No back pain.  Psych:  No change in mood or affect. No depression or anxiety.  No memory loss.   Vital Signs BP 134/74 (BP Location: Left Arm, Cuff Size: Normal)   Pulse 97   Ht '5\' 2"'  (1.575 m)   Wt 166 lb 12.8 oz (75.7 kg)   SpO2 95%   BMI 30.51 kg/m    Physical Exam:  General- No distress,  A&Ox3, pleasant ENT: No sinus tenderness, TM clear, pale nasal mucosa, no oral exudate,no post nasal drip, no LAN Cardiac: S1, S2, regular rate and rhythm, no murmur Chest: No wheeze/ rales/ dullness; no accessory muscle use, no nasal flaring, no sternal retractions Abd.: Soft Non-tender, ND, Obese Ext: No clubbing cyanosis, edema Neuro:  normal strength, MAE x 4, A&O x 3 Skin: No rashes, warm and dry Psych: normal mood and behavior   Assessment/Plan  COPD without exacerbation (HCC) Follow up of Therapeutic Trial of Trelegy Pt. States she really likes the Trelegy Breathing is better than it has been in a long time Plan: We will give you a prescription for Trelegy. Continue 1 puff once daily. Use your rescue inhaler as needed for breakthrough shortness of breath up to 4 times a day. Use your nebs as needed for rescue.( Albuterol) Consider going to the  [place] for exercise to re-condition . Note your daily symptoms > remember "red flags" for COPD:  Increase in cough, increase in sputum production, change in color of your secretions,  increase in shortness of breath with activity tolerance. If you notice these symptoms, please call to be seen.   Follow up with Pam Gamble or Judson Roch NP in 4 months. Please contact office for sooner follow up if symptoms do not improve or worsen or seek emergency care     [name], NP 02/09/2018  10:04 AM

## 2018-02-09 NOTE — Patient Instructions (Signed)
It is great to see you today. We will give you a prescription for Trelegy. Continue 1 puff once daily. Use your rescue inhaler as needed for breakthrough shortness of breath up to 4 times a day. Use your nebs as needed for rescue.( Albuterol) Consider going to the [place] for exercise to re-condition . Note your daily symptoms > remember "red flags" for COPD:  Increase in cough, increase in sputum production, change in color of your secretions,  increase in shortness of breath with activity tolerance. If you notice these symptoms, please call to be seen.   Follow up with Dr. Vaughan Browner or Judson Roch NP in 4 months. Please contact office for sooner follow up if symptoms do not improve or worsen or seek emergency care

## 2018-02-09 NOTE — Assessment & Plan Note (Signed)
Follow up of Therapeutic Trial of Trelegy Pt. States she really likes the Trelegy Breathing is better than it has been in a long time Plan: We will give you a prescription for Trelegy. Continue 1 puff once daily. Use your rescue inhaler as needed for breakthrough shortness of breath up to 4 times a day. Use your nebs as needed for rescue.( Albuterol) Consider going to the [place] for exercise to re-condition . Note your daily symptoms > remember "red flags" for COPD:  Increase in cough, increase in sputum production, change in color of your secretions,  increase in shortness of breath with activity tolerance. If you notice these symptoms, please call to be seen.   Follow up with Dr. Vaughan Browner or Judson Roch NP in 4 months. Please contact office for sooner follow up if symptoms do not improve or worsen or seek emergency care

## 2018-02-13 ENCOUNTER — Other Ambulatory Visit: Payer: Self-pay | Admitting: Internal Medicine

## 2018-02-26 ENCOUNTER — Telehealth: Payer: Self-pay | Admitting: *Deleted

## 2018-02-26 NOTE — Telephone Encounter (Signed)
CT CORONARY MORPH  Received: 1 week ago  Message Contents  Jerlyn Ly, LPN        Pam Gamble 02/17/2018 9:28 AM DENIED. MSG TO DR Meda Coffee

## 2018-03-17 ENCOUNTER — Encounter: Payer: Self-pay | Admitting: Physician Assistant

## 2018-03-25 DIAGNOSIS — Z79899 Other long term (current) drug therapy: Secondary | ICD-10-CM | POA: Diagnosis not present

## 2018-03-25 DIAGNOSIS — Z1339 Encounter for screening examination for other mental health and behavioral disorders: Secondary | ICD-10-CM | POA: Diagnosis not present

## 2018-03-25 DIAGNOSIS — J449 Chronic obstructive pulmonary disease, unspecified: Secondary | ICD-10-CM | POA: Diagnosis not present

## 2018-03-25 DIAGNOSIS — Z794 Long term (current) use of insulin: Secondary | ICD-10-CM | POA: Diagnosis not present

## 2018-03-25 DIAGNOSIS — Z6829 Body mass index (BMI) 29.0-29.9, adult: Secondary | ICD-10-CM | POA: Diagnosis not present

## 2018-03-25 DIAGNOSIS — Z1331 Encounter for screening for depression: Secondary | ICD-10-CM | POA: Diagnosis not present

## 2018-03-25 DIAGNOSIS — Z7689 Persons encountering health services in other specified circumstances: Secondary | ICD-10-CM | POA: Diagnosis not present

## 2018-03-25 DIAGNOSIS — K219 Gastro-esophageal reflux disease without esophagitis: Secondary | ICD-10-CM | POA: Diagnosis not present

## 2018-03-25 DIAGNOSIS — E119 Type 2 diabetes mellitus without complications: Secondary | ICD-10-CM | POA: Diagnosis not present

## 2018-04-01 DIAGNOSIS — E119 Type 2 diabetes mellitus without complications: Secondary | ICD-10-CM | POA: Diagnosis not present

## 2018-04-01 DIAGNOSIS — Z794 Long term (current) use of insulin: Secondary | ICD-10-CM | POA: Diagnosis not present

## 2018-04-07 ENCOUNTER — Ambulatory Visit: Payer: Medicare HMO | Admitting: Physician Assistant

## 2018-04-07 ENCOUNTER — Other Ambulatory Visit: Payer: Medicare HMO

## 2018-04-13 DIAGNOSIS — Z6829 Body mass index (BMI) 29.0-29.9, adult: Secondary | ICD-10-CM | POA: Diagnosis not present

## 2018-04-13 DIAGNOSIS — Z794 Long term (current) use of insulin: Secondary | ICD-10-CM | POA: Diagnosis not present

## 2018-04-13 DIAGNOSIS — E119 Type 2 diabetes mellitus without complications: Secondary | ICD-10-CM | POA: Diagnosis not present

## 2018-04-13 DIAGNOSIS — R030 Elevated blood-pressure reading, without diagnosis of hypertension: Secondary | ICD-10-CM | POA: Diagnosis not present

## 2018-04-13 DIAGNOSIS — J449 Chronic obstructive pulmonary disease, unspecified: Secondary | ICD-10-CM | POA: Diagnosis not present

## 2018-04-28 DIAGNOSIS — Z794 Long term (current) use of insulin: Secondary | ICD-10-CM | POA: Diagnosis not present

## 2018-04-28 DIAGNOSIS — Z1339 Encounter for screening examination for other mental health and behavioral disorders: Secondary | ICD-10-CM | POA: Diagnosis not present

## 2018-04-28 DIAGNOSIS — J449 Chronic obstructive pulmonary disease, unspecified: Secondary | ICD-10-CM | POA: Diagnosis not present

## 2018-04-28 DIAGNOSIS — Z6829 Body mass index (BMI) 29.0-29.9, adult: Secondary | ICD-10-CM | POA: Diagnosis not present

## 2018-04-28 DIAGNOSIS — E119 Type 2 diabetes mellitus without complications: Secondary | ICD-10-CM | POA: Diagnosis not present

## 2018-05-12 DIAGNOSIS — R69 Illness, unspecified: Secondary | ICD-10-CM | POA: Diagnosis not present

## 2018-05-20 DIAGNOSIS — J449 Chronic obstructive pulmonary disease, unspecified: Secondary | ICD-10-CM | POA: Diagnosis not present

## 2018-06-05 DIAGNOSIS — Z794 Long term (current) use of insulin: Secondary | ICD-10-CM | POA: Diagnosis not present

## 2018-06-05 DIAGNOSIS — E119 Type 2 diabetes mellitus without complications: Secondary | ICD-10-CM | POA: Diagnosis not present

## 2018-06-05 DIAGNOSIS — Z6828 Body mass index (BMI) 28.0-28.9, adult: Secondary | ICD-10-CM | POA: Diagnosis not present

## 2018-06-05 DIAGNOSIS — J449 Chronic obstructive pulmonary disease, unspecified: Secondary | ICD-10-CM | POA: Diagnosis not present

## 2018-06-25 DIAGNOSIS — E119 Type 2 diabetes mellitus without complications: Secondary | ICD-10-CM | POA: Diagnosis not present

## 2018-06-25 DIAGNOSIS — Z6829 Body mass index (BMI) 29.0-29.9, adult: Secondary | ICD-10-CM | POA: Diagnosis not present

## 2018-06-25 DIAGNOSIS — Z794 Long term (current) use of insulin: Secondary | ICD-10-CM | POA: Diagnosis not present

## 2018-06-25 DIAGNOSIS — J449 Chronic obstructive pulmonary disease, unspecified: Secondary | ICD-10-CM | POA: Diagnosis not present

## 2018-06-30 DIAGNOSIS — Z794 Long term (current) use of insulin: Secondary | ICD-10-CM | POA: Diagnosis not present

## 2018-06-30 DIAGNOSIS — N209 Urinary calculus, unspecified: Secondary | ICD-10-CM | POA: Diagnosis not present

## 2018-06-30 DIAGNOSIS — E8770 Fluid overload, unspecified: Secondary | ICD-10-CM | POA: Diagnosis not present

## 2018-06-30 DIAGNOSIS — E1165 Type 2 diabetes mellitus with hyperglycemia: Secondary | ICD-10-CM | POA: Diagnosis not present

## 2018-06-30 DIAGNOSIS — J9601 Acute respiratory failure with hypoxia: Secondary | ICD-10-CM | POA: Diagnosis not present

## 2018-06-30 DIAGNOSIS — Z7984 Long term (current) use of oral hypoglycemic drugs: Secondary | ICD-10-CM | POA: Diagnosis not present

## 2018-06-30 DIAGNOSIS — R1032 Left lower quadrant pain: Secondary | ICD-10-CM | POA: Diagnosis not present

## 2018-06-30 DIAGNOSIS — K219 Gastro-esophageal reflux disease without esophagitis: Secondary | ICD-10-CM | POA: Diagnosis not present

## 2018-06-30 DIAGNOSIS — R739 Hyperglycemia, unspecified: Secondary | ICD-10-CM | POA: Diagnosis not present

## 2018-06-30 DIAGNOSIS — N179 Acute kidney failure, unspecified: Secondary | ICD-10-CM | POA: Diagnosis not present

## 2018-06-30 DIAGNOSIS — N200 Calculus of kidney: Secondary | ICD-10-CM | POA: Diagnosis not present

## 2018-06-30 DIAGNOSIS — R109 Unspecified abdominal pain: Secondary | ICD-10-CM | POA: Diagnosis not present

## 2018-06-30 DIAGNOSIS — J90 Pleural effusion, not elsewhere classified: Secondary | ICD-10-CM | POA: Diagnosis not present

## 2018-06-30 DIAGNOSIS — N132 Hydronephrosis with renal and ureteral calculous obstruction: Secondary | ICD-10-CM | POA: Diagnosis not present

## 2018-06-30 DIAGNOSIS — E875 Hyperkalemia: Secondary | ICD-10-CM | POA: Diagnosis not present

## 2018-06-30 DIAGNOSIS — N390 Urinary tract infection, site not specified: Secondary | ICD-10-CM | POA: Diagnosis not present

## 2018-06-30 DIAGNOSIS — I517 Cardiomegaly: Secondary | ICD-10-CM | POA: Diagnosis not present

## 2018-07-03 DIAGNOSIS — I517 Cardiomegaly: Secondary | ICD-10-CM

## 2018-07-03 DIAGNOSIS — R109 Unspecified abdominal pain: Secondary | ICD-10-CM

## 2018-07-07 DIAGNOSIS — N200 Calculus of kidney: Secondary | ICD-10-CM | POA: Diagnosis not present

## 2018-07-07 DIAGNOSIS — E119 Type 2 diabetes mellitus without complications: Secondary | ICD-10-CM | POA: Diagnosis not present

## 2018-07-07 DIAGNOSIS — Z794 Long term (current) use of insulin: Secondary | ICD-10-CM | POA: Diagnosis not present

## 2018-07-07 DIAGNOSIS — Z7189 Other specified counseling: Secondary | ICD-10-CM | POA: Diagnosis not present

## 2018-07-07 DIAGNOSIS — J449 Chronic obstructive pulmonary disease, unspecified: Secondary | ICD-10-CM | POA: Diagnosis not present

## 2018-07-07 DIAGNOSIS — Z6828 Body mass index (BMI) 28.0-28.9, adult: Secondary | ICD-10-CM | POA: Diagnosis not present

## 2018-07-08 DIAGNOSIS — R1032 Left lower quadrant pain: Secondary | ICD-10-CM | POA: Diagnosis not present

## 2018-07-08 DIAGNOSIS — N200 Calculus of kidney: Secondary | ICD-10-CM | POA: Diagnosis not present

## 2018-07-15 DIAGNOSIS — R1032 Left lower quadrant pain: Secondary | ICD-10-CM | POA: Diagnosis not present

## 2018-07-15 DIAGNOSIS — N200 Calculus of kidney: Secondary | ICD-10-CM | POA: Diagnosis not present

## 2018-07-17 DIAGNOSIS — E119 Type 2 diabetes mellitus without complications: Secondary | ICD-10-CM | POA: Diagnosis not present

## 2018-07-17 DIAGNOSIS — N200 Calculus of kidney: Secondary | ICD-10-CM | POA: Diagnosis not present

## 2018-07-21 DIAGNOSIS — Z794 Long term (current) use of insulin: Secondary | ICD-10-CM | POA: Diagnosis not present

## 2018-07-21 DIAGNOSIS — N200 Calculus of kidney: Secondary | ICD-10-CM | POA: Diagnosis not present

## 2018-07-21 DIAGNOSIS — Z9181 History of falling: Secondary | ICD-10-CM | POA: Diagnosis not present

## 2018-07-21 DIAGNOSIS — E119 Type 2 diabetes mellitus without complications: Secondary | ICD-10-CM | POA: Diagnosis not present

## 2018-07-21 DIAGNOSIS — Z6828 Body mass index (BMI) 28.0-28.9, adult: Secondary | ICD-10-CM | POA: Diagnosis not present

## 2018-07-21 DIAGNOSIS — J449 Chronic obstructive pulmonary disease, unspecified: Secondary | ICD-10-CM | POA: Diagnosis not present

## 2018-07-24 DIAGNOSIS — R1032 Left lower quadrant pain: Secondary | ICD-10-CM | POA: Diagnosis not present

## 2018-07-24 DIAGNOSIS — N200 Calculus of kidney: Secondary | ICD-10-CM | POA: Diagnosis not present

## 2018-07-30 DIAGNOSIS — R69 Illness, unspecified: Secondary | ICD-10-CM | POA: Diagnosis not present

## 2018-08-03 DIAGNOSIS — R69 Illness, unspecified: Secondary | ICD-10-CM | POA: Diagnosis not present

## 2018-08-04 DIAGNOSIS — R1032 Left lower quadrant pain: Secondary | ICD-10-CM | POA: Diagnosis not present

## 2018-08-04 DIAGNOSIS — N200 Calculus of kidney: Secondary | ICD-10-CM | POA: Diagnosis not present

## 2018-08-12 DIAGNOSIS — Z6827 Body mass index (BMI) 27.0-27.9, adult: Secondary | ICD-10-CM | POA: Diagnosis not present

## 2018-08-12 DIAGNOSIS — R1032 Left lower quadrant pain: Secondary | ICD-10-CM | POA: Diagnosis not present

## 2018-08-12 DIAGNOSIS — J441 Chronic obstructive pulmonary disease with (acute) exacerbation: Secondary | ICD-10-CM | POA: Diagnosis not present

## 2018-08-12 DIAGNOSIS — N200 Calculus of kidney: Secondary | ICD-10-CM | POA: Diagnosis not present

## 2018-08-14 DIAGNOSIS — E119 Type 2 diabetes mellitus without complications: Secondary | ICD-10-CM | POA: Diagnosis not present

## 2018-08-14 DIAGNOSIS — J449 Chronic obstructive pulmonary disease, unspecified: Secondary | ICD-10-CM | POA: Diagnosis not present

## 2018-08-14 DIAGNOSIS — Z960 Presence of urogenital implants: Secondary | ICD-10-CM | POA: Diagnosis not present

## 2018-08-14 DIAGNOSIS — E669 Obesity, unspecified: Secondary | ICD-10-CM | POA: Diagnosis not present

## 2018-08-14 DIAGNOSIS — N200 Calculus of kidney: Secondary | ICD-10-CM | POA: Diagnosis not present

## 2018-08-20 DIAGNOSIS — J441 Chronic obstructive pulmonary disease with (acute) exacerbation: Secondary | ICD-10-CM | POA: Diagnosis not present

## 2018-08-20 DIAGNOSIS — N200 Calculus of kidney: Secondary | ICD-10-CM | POA: Diagnosis not present

## 2018-08-20 DIAGNOSIS — E119 Type 2 diabetes mellitus without complications: Secondary | ICD-10-CM | POA: Diagnosis not present

## 2018-08-20 DIAGNOSIS — Z6827 Body mass index (BMI) 27.0-27.9, adult: Secondary | ICD-10-CM | POA: Diagnosis not present

## 2018-08-20 DIAGNOSIS — Z794 Long term (current) use of insulin: Secondary | ICD-10-CM | POA: Diagnosis not present

## 2018-08-21 DIAGNOSIS — R1032 Left lower quadrant pain: Secondary | ICD-10-CM | POA: Diagnosis not present

## 2018-08-21 DIAGNOSIS — N200 Calculus of kidney: Secondary | ICD-10-CM | POA: Diagnosis not present

## 2018-08-25 DIAGNOSIS — N200 Calculus of kidney: Secondary | ICD-10-CM | POA: Diagnosis not present

## 2018-08-25 DIAGNOSIS — R1032 Left lower quadrant pain: Secondary | ICD-10-CM | POA: Diagnosis not present

## 2018-08-28 DIAGNOSIS — J4540 Moderate persistent asthma, uncomplicated: Secondary | ICD-10-CM | POA: Diagnosis not present

## 2018-09-04 DIAGNOSIS — J449 Chronic obstructive pulmonary disease, unspecified: Secondary | ICD-10-CM | POA: Diagnosis not present

## 2018-09-04 DIAGNOSIS — Z6827 Body mass index (BMI) 27.0-27.9, adult: Secondary | ICD-10-CM | POA: Diagnosis not present

## 2018-09-04 DIAGNOSIS — J302 Other seasonal allergic rhinitis: Secondary | ICD-10-CM | POA: Diagnosis not present

## 2018-09-04 DIAGNOSIS — Z794 Long term (current) use of insulin: Secondary | ICD-10-CM | POA: Diagnosis not present

## 2018-09-04 DIAGNOSIS — E119 Type 2 diabetes mellitus without complications: Secondary | ICD-10-CM | POA: Diagnosis not present

## 2018-09-15 DIAGNOSIS — N200 Calculus of kidney: Secondary | ICD-10-CM | POA: Diagnosis not present

## 2018-09-15 DIAGNOSIS — R1032 Left lower quadrant pain: Secondary | ICD-10-CM | POA: Diagnosis not present

## 2018-10-01 ENCOUNTER — Encounter (HOSPITAL_COMMUNITY): Payer: Self-pay | Admitting: *Deleted

## 2018-10-01 ENCOUNTER — Inpatient Hospital Stay (HOSPITAL_COMMUNITY)
Admission: AD | Admit: 2018-10-01 | Discharge: 2018-10-04 | DRG: 418 | Disposition: A | Discharge status: Home / Self Care | Payer: Medicare HMO | Source: Other Acute Inpatient Hospital | Attending: Internal Medicine | Admitting: Internal Medicine

## 2018-10-01 DIAGNOSIS — Z881 Allergy status to other antibiotic agents status: Secondary | ICD-10-CM

## 2018-10-01 DIAGNOSIS — E785 Hyperlipidemia, unspecified: Secondary | ICD-10-CM | POA: Diagnosis present

## 2018-10-01 DIAGNOSIS — Z880 Allergy status to penicillin: Secondary | ICD-10-CM | POA: Diagnosis not present

## 2018-10-01 DIAGNOSIS — E1165 Type 2 diabetes mellitus with hyperglycemia: Secondary | ICD-10-CM

## 2018-10-01 DIAGNOSIS — K219 Gastro-esophageal reflux disease without esophagitis: Secondary | ICD-10-CM | POA: Diagnosis present

## 2018-10-01 DIAGNOSIS — K8309 Other cholangitis: Secondary | ICD-10-CM | POA: Diagnosis present

## 2018-10-01 DIAGNOSIS — D473 Essential (hemorrhagic) thrombocythemia: Secondary | ICD-10-CM | POA: Diagnosis present

## 2018-10-01 DIAGNOSIS — D1801 Hemangioma of skin and subcutaneous tissue: Secondary | ICD-10-CM | POA: Diagnosis not present

## 2018-10-01 DIAGNOSIS — I10 Essential (primary) hypertension: Secondary | ICD-10-CM | POA: Diagnosis not present

## 2018-10-01 DIAGNOSIS — Z87442 Personal history of urinary calculi: Secondary | ICD-10-CM | POA: Diagnosis not present

## 2018-10-01 DIAGNOSIS — Z87891 Personal history of nicotine dependence: Secondary | ICD-10-CM

## 2018-10-01 DIAGNOSIS — Z794 Long term (current) use of insulin: Secondary | ICD-10-CM | POA: Diagnosis not present

## 2018-10-01 DIAGNOSIS — Z90710 Acquired absence of both cervix and uterus: Secondary | ICD-10-CM

## 2018-10-01 DIAGNOSIS — K851 Biliary acute pancreatitis without necrosis or infection: Secondary | ICD-10-CM | POA: Diagnosis not present

## 2018-10-01 DIAGNOSIS — R945 Abnormal results of liver function studies: Secondary | ICD-10-CM | POA: Diagnosis not present

## 2018-10-01 DIAGNOSIS — R109 Unspecified abdominal pain: Secondary | ICD-10-CM

## 2018-10-01 DIAGNOSIS — Z8542 Personal history of malignant neoplasm of other parts of uterus: Secondary | ICD-10-CM

## 2018-10-01 DIAGNOSIS — Z833 Family history of diabetes mellitus: Secondary | ICD-10-CM | POA: Diagnosis not present

## 2018-10-01 DIAGNOSIS — Z888 Allergy status to other drugs, medicaments and biological substances status: Secondary | ICD-10-CM

## 2018-10-01 DIAGNOSIS — K810 Acute cholecystitis: Secondary | ICD-10-CM | POA: Diagnosis not present

## 2018-10-01 DIAGNOSIS — Z7951 Long term (current) use of inhaled steroids: Secondary | ICD-10-CM

## 2018-10-01 DIAGNOSIS — K8021 Calculus of gallbladder without cholecystitis with obstruction: Secondary | ICD-10-CM

## 2018-10-01 DIAGNOSIS — E875 Hyperkalemia: Secondary | ICD-10-CM | POA: Diagnosis not present

## 2018-10-01 DIAGNOSIS — R1011 Right upper quadrant pain: Secondary | ICD-10-CM | POA: Diagnosis present

## 2018-10-01 DIAGNOSIS — IMO0002 Reserved for concepts with insufficient information to code with codable children: Secondary | ICD-10-CM | POA: Diagnosis present

## 2018-10-01 DIAGNOSIS — D649 Anemia, unspecified: Secondary | ICD-10-CM | POA: Diagnosis present

## 2018-10-01 DIAGNOSIS — Z9081 Acquired absence of spleen: Secondary | ICD-10-CM

## 2018-10-01 DIAGNOSIS — K8012 Calculus of gallbladder with acute and chronic cholecystitis without obstruction: Secondary | ICD-10-CM | POA: Diagnosis present

## 2018-10-01 DIAGNOSIS — R932 Abnormal findings on diagnostic imaging of liver and biliary tract: Secondary | ICD-10-CM | POA: Diagnosis not present

## 2018-10-01 DIAGNOSIS — J449 Chronic obstructive pulmonary disease, unspecified: Secondary | ICD-10-CM | POA: Diagnosis not present

## 2018-10-01 DIAGNOSIS — R509 Fever, unspecified: Secondary | ICD-10-CM

## 2018-10-01 DIAGNOSIS — R112 Nausea with vomiting, unspecified: Secondary | ICD-10-CM | POA: Diagnosis not present

## 2018-10-01 DIAGNOSIS — Z8249 Family history of ischemic heart disease and other diseases of the circulatory system: Secondary | ICD-10-CM | POA: Diagnosis not present

## 2018-10-01 DIAGNOSIS — K3580 Unspecified acute appendicitis: Secondary | ICD-10-CM | POA: Diagnosis not present

## 2018-10-01 DIAGNOSIS — R7989 Other specified abnormal findings of blood chemistry: Secondary | ICD-10-CM | POA: Diagnosis not present

## 2018-10-01 DIAGNOSIS — K2270 Barrett's esophagus without dysplasia: Secondary | ICD-10-CM | POA: Diagnosis present

## 2018-10-01 DIAGNOSIS — E119 Type 2 diabetes mellitus without complications: Secondary | ICD-10-CM | POA: Diagnosis not present

## 2018-10-01 DIAGNOSIS — Z79899 Other long term (current) drug therapy: Secondary | ICD-10-CM

## 2018-10-01 DIAGNOSIS — N132 Hydronephrosis with renal and ureteral calculous obstruction: Secondary | ICD-10-CM | POA: Diagnosis not present

## 2018-10-01 DIAGNOSIS — K8020 Calculus of gallbladder without cholecystitis without obstruction: Secondary | ICD-10-CM | POA: Diagnosis not present

## 2018-10-01 DIAGNOSIS — K8010 Calculus of gallbladder with chronic cholecystitis without obstruction: Secondary | ICD-10-CM | POA: Diagnosis not present

## 2018-10-01 DIAGNOSIS — K8000 Calculus of gallbladder with acute cholecystitis without obstruction: Secondary | ICD-10-CM | POA: Diagnosis not present

## 2018-10-01 HISTORY — DX: Other cholangitis: K83.09

## 2018-10-01 HISTORY — DX: Biliary acute pancreatitis without necrosis or infection: K85.10

## 2018-10-01 LAB — LIPASE, BLOOD: Lipase: 146 U/L — ABNORMAL HIGH (ref 11–51)

## 2018-10-01 LAB — GLUCOSE, CAPILLARY: Glucose-Capillary: 154 mg/dL — ABNORMAL HIGH (ref 70–99)

## 2018-10-01 MED ORDER — FENTANYL CITRATE (PF) 100 MCG/2ML IJ SOLN
12.5000 ug | INTRAMUSCULAR | Status: DC | PRN
  Administered 2018-10-01 – 2018-10-03 (×4): 25 ug via INTRAVENOUS
  Filled 2018-10-01 (×4): qty 2

## 2018-10-01 MED ORDER — ONDANSETRON HCL 4 MG/2ML IJ SOLN
4.0000 mg | Freq: Four times a day (QID) | INTRAMUSCULAR | Status: DC | PRN
  Administered 2018-10-03: 4 mg via INTRAVENOUS
  Filled 2018-10-01: qty 2

## 2018-10-01 MED ORDER — FLUTICASONE FUROATE-VILANTEROL 100-25 MCG/INH IN AEPB
1.0000 | INHALATION_SPRAY | Freq: Every day | RESPIRATORY_TRACT | Status: DC
  Administered 2018-10-02: 1 via RESPIRATORY_TRACT
  Filled 2018-10-01: qty 28

## 2018-10-01 MED ORDER — SODIUM CHLORIDE 0.9 % IV SOLN
INTRAVENOUS | Status: AC
  Administered 2018-10-01: 22:00:00 via INTRAVENOUS

## 2018-10-01 MED ORDER — ACETAMINOPHEN 650 MG RE SUPP: 650.0000 mg | Freq: Four times a day (QID) | RECTAL | Status: DC | PRN

## 2018-10-01 MED ORDER — FLUTICASONE-UMECLIDIN-VILANT 100-62.5-25 MCG/INH IN AEPB: 1.0000 | INHALATION_SPRAY | Freq: Every day | RESPIRATORY_TRACT | Status: DC

## 2018-10-01 MED ORDER — ONDANSETRON HCL 4 MG PO TABS: 4.0000 mg | ORAL_TABLET | Freq: Four times a day (QID) | ORAL | Status: DC | PRN

## 2018-10-01 MED ORDER — UMECLIDINIUM BROMIDE 62.5 MCG/INH IN AEPB
1.0000 | INHALATION_SPRAY | Freq: Every day | RESPIRATORY_TRACT | Status: DC
  Administered 2018-10-02: 1 via RESPIRATORY_TRACT
  Filled 2018-10-01: qty 7

## 2018-10-01 MED ORDER — ACETAMINOPHEN 325 MG PO TABS: 650.0000 mg | ORAL_TABLET | Freq: Four times a day (QID) | ORAL | Status: DC | PRN

## 2018-10-01 MED ORDER — POLYVINYL ALCOHOL 1.4 % OP SOLN: 1.0000 [drp] | OPHTHALMIC | Status: DC | PRN

## 2018-10-01 MED ORDER — ALBUTEROL SULFATE (2.5 MG/3ML) 0.083% IN NEBU: 3.0000 mL | INHALATION_SOLUTION | Freq: Four times a day (QID) | RESPIRATORY_TRACT | Status: DC | PRN

## 2018-10-01 MED ORDER — INSULIN GLARGINE 100 UNIT/ML ~~LOC~~ SOLN
10.0000 [IU] | Freq: Every day | SUBCUTANEOUS | Status: DC
  Administered 2018-10-02 – 2018-10-04 (×3): 10 [IU] via SUBCUTANEOUS
  Filled 2018-10-01 (×3): qty 0.1

## 2018-10-01 MED ORDER — FAMOTIDINE IN NACL 20-0.9 MG/50ML-% IV SOLN
20.0000 mg | Freq: Two times a day (BID) | INTRAVENOUS | Status: DC
  Administered 2018-10-01 – 2018-10-04 (×5): 20 mg via INTRAVENOUS
  Filled 2018-10-01 (×7): qty 50

## 2018-10-01 MED ORDER — ENOXAPARIN SODIUM 40 MG/0.4ML ~~LOC~~ SOLN
40.0000 mg | SUBCUTANEOUS | Status: DC
  Administered 2018-10-01 – 2018-10-03 (×3): 40 mg via SUBCUTANEOUS
  Filled 2018-10-01 (×3): qty 0.4

## 2018-10-01 MED ORDER — INSULIN ASPART 100 UNIT/ML ~~LOC~~ SOLN
0.0000 [IU] | SUBCUTANEOUS | Status: DC
  Administered 2018-10-01: 2 [IU] via SUBCUTANEOUS
  Administered 2018-10-02: 3 [IU] via SUBCUTANEOUS
  Administered 2018-10-02 (×2): 1 [IU] via SUBCUTANEOUS
  Administered 2018-10-03: 2 [IU] via SUBCUTANEOUS
  Administered 2018-10-03: 3 [IU] via SUBCUTANEOUS
  Administered 2018-10-03: 1 [IU] via SUBCUTANEOUS
  Administered 2018-10-03: 2 [IU] via SUBCUTANEOUS
  Administered 2018-10-03: 1 [IU] via SUBCUTANEOUS
  Administered 2018-10-04: 3 [IU] via SUBCUTANEOUS
  Administered 2018-10-04: 2 [IU] via SUBCUTANEOUS
  Administered 2018-10-04: 5 [IU] via SUBCUTANEOUS

## 2018-10-01 MED ORDER — PIPERACILLIN-TAZOBACTAM 3.375 G IVPB
3.3750 g | Freq: Three times a day (TID) | INTRAVENOUS | Status: DC
  Administered 2018-10-01 – 2018-10-03 (×5): 3.375 g via INTRAVENOUS
  Filled 2018-10-01 (×7): qty 50

## 2018-10-01 NOTE — Progress Notes (Signed)
Pharmacy Antibiotic Note  Pam Gamble is a 72 y.o. female admitted on 10/01/2018 with intra-abdominal infection.  Pharmacy has been consulted for Zosyn dosing.    Dosage will likely remain stable at below dosage  and need for further dosage adjustment appears unlikely at present.    Will sign off at this time.  Please reconsult if a change in clinical status warrants re-evaluation of dosage.     Plan: Zosyn 3.375g IV q8h (4 hour infusion).  Monitor clinical course, renal function, cultures as available      Temp (24hrs), Avg:100.1 F (37.8 C), Min:100.1 F (37.8 C), Max:100.1 F (37.8 C)  No results for input(s): WBC, CREATININE, LATICACIDVEN, VANCOTROUGH, VANCOPEAK, VANCORANDOM, GENTTROUGH, GENTPEAK, GENTRANDOM, TOBRATROUGH, TOBRAPEAK, TOBRARND, AMIKACINPEAK, AMIKACINTROU, AMIKACIN in the last 168 hours.  CrCl cannot be calculated (Patient's most recent lab result is older than the maximum 21 days allowed.).    Allergies  Allergen Reactions  . Amoxicillin-Pot Clavulanate Nausea Only and Other (See Comments)    REACTION: diarrhea "sick to stomach" Has patient had a PCN reaction causing immediate rash, facial/tongue/throat swelling, SOB or lightheadedness with hypotension: no Has patient had a PCN reaction causing severe rash involving mucus membranes or skin necrosis: no Has patient had a PCN reaction that required hospitalization: no Has patient had a PCN reaction occurring within the last 10 years: no If all of the above answers are "NO", then may proceed with Cephalosporin use.   . Ertugliflozin     Steglatro -  - "it made me feel funny"  . Levaquin [Levofloxacin] Nausea Only     Thank you for allowing pharmacy to be a part of this patient's care.  Royetta Asal, PharmD, BCPS Pager [phone] 10/01/2018 9:27 PM

## 2018-10-01 NOTE — H&P (Signed)
History and Physical    Pam Gamble HFW:[id number] DOB: [birth year] DOA: 10/01/2018  PCP: Cassandria Anger, MD   Patient coming from: Home, by way of [facility]   Chief Complaint: Abd pain, N/V, chills   HPI: Pam Gamble is a 72 y.o. female with medical history significant for COPD, type 2 diabetes mellitus, hypertension, and hyperlipidemia, now presenting to the emergency department for evaluation of right upper quadrant abdominal pain with nausea, vomiting, and chills.  Patient reports that she was in her usual state of health when the symptoms developed 3 days ago.  Pain is localized to the right upper quadrant, severe at times, waxing and waning, and associated with nausea and nonbloody vomiting.  She denies any significant shortness of breath, wheezing, cough, or chest pain.  No diarrhea.  [facility] ED Course: Upon arrival to the ED, patient is found to be afebrile, slightly tachycardic, and with stable blood pressure.  Chemistry panel is notable for AST 641, ALT 182, and total bilirubin 2.4.  CBC features of leukocytosis to 15,700 and chronic thrombocytosis with platelets 491,000.  Lipase is elevated to 9599.  Lactic acid is reassuringly normal.  Patient was given 2 L normal saline, Levaquin, Flagyl, morphine, and Zofran in the ED.  Tachycardia resolved, blood pressure remained stable, and transferred to [facility] long hospital was arranged for ongoing evaluation and management.  Review of Systems:  All other systems reviewed and apart from HPI, are negative.  Past Medical History:  Diagnosis Date  . Barrett's esophagus   . Calcium deposits in tendon and bursa   . Chronic airway obstruction, not elsewhere classified   . COPD (chronic obstructive pulmonary disease) ([place])   . Depression   . Diabetes mellitus 2008ish   type two  . Esophageal reflux   . Idiopathic thrombocythemia   . Kidney stones   . Lateral epicondylitis  of elbow   . Personal history of malignant neoplasm of other  parts of uterus   . Personal history of other diseases of digestive system   . Shortness of breath    upon exertion    Past Surgical History:  Procedure Laterality Date  . ABDOMINAL HYSTERECTOMY    . CATARACT EXTRACTION Bilateral 01/2016 and 03/2016  . LITHOTRIPSY    . REFRACTIVE SURGERY Right 05/16/2016  . SPLENECTOMY  1999  . TONSILLECTOMY AND ADENOIDECTOMY       reports that she quit smoking about 12 years ago. Her smoking use included cigarettes. She has a 98.00 pack-year smoking history. She has never used smokeless tobacco. She reports that she drinks alcohol. She reports that she does not use drugs.  Allergies  Allergen Reactions  . Amoxicillin-Pot Clavulanate Nausea Only and Other (See Comments)    REACTION: diarrhea "sick to stomach" Has patient had a PCN reaction causing immediate rash, facial/tongue/throat swelling, SOB or lightheadedness with hypotension: no Has patient had a PCN reaction causing severe rash involving mucus membranes or skin necrosis: no Has patient had a PCN reaction that required hospitalization: no Has patient had a PCN reaction occurring within the last 10 years: no If all of the above answers are "NO", then may proceed with Cephalosporin use.   . Ertugliflozin     Steglatro -  - "it made me feel funny"  . Levaquin [Levofloxacin] Nausea Only    Family History  Problem Relation Age of Onset  . Liver cancer Sister   . Pancreatic cancer Sister   . Stroke Mother   . Heart disease  Mother        CHF  . Heart disease Father        MI  . Diabetes Father   . Diabetes Sister   . Diabetes Maternal Aunt      Prior to Admission medications   Medication Sig Start Date End Date Taking? Authorizing Provider  Fluticasone-Umeclidin-Vilant (TRELEGY ELLIPTA) 100-62.5-25 MCG/INH AEPB Inhale 1 puff into the lungs daily. 02/09/18  Yes [name], NP  levocetirizine (XYZAL) 5 MG tablet Take 5 mg by mouth at bedtime. 09/04/18  Yes [provider]    metFORMIN (GLUCOPHAGE) 1000 MG tablet TAKE ONE TABLET BY MOUTH TWICE DAILY WITH MEALS Patient taking differently: Take 1,000 mg by mouth 2 (two) times daily with a meal.  10/01/17  Yes Plotnikov, Evie Lacks, MD  montelukast (SINGULAIR) 10 MG tablet Take 10 mg by mouth daily. 08/28/18  Yes [provider]  omeprazole (PRILOSEC) 40 MG capsule Take 1 capsule (40 mg total) by mouth daily. 01/02/16  Yes Plotnikov, Evie Lacks, MD  albuterol (PROVENTIL HFA;VENTOLIN HFA) 108 (90 BASE) MCG/ACT inhaler Inhale 2 puffs into the lungs every 6 (six) hours as needed for wheezing. 07/27/14   Plotnikov, Evie Lacks, MD  albuterol (PROVENTIL HFA;VENTOLIN HFA) 108 (90 Base) MCG/ACT inhaler Inhale 2 puffs into the lungs every 6 (six) hours as needed for wheezing or shortness of breath. 02/09/18   [name], NP  B Complex-C-Folic Acid (STRESS B COMPLEX PO) Take 1 capsule by mouth daily.    [provider]  Blood Glucose Monitoring Suppl (ONE TOUCH ULTRA SYSTEM KIT) w/Device KIT 1 kit by Does not apply route once. 01/26/16   Plotnikov, Evie Lacks, MD  Cetirizine HCl (ZYRTEC PO) Take 1 tablet by mouth daily.    [provider]  cholecalciferol (VITAMIN D) 1000 UNITS tablet Take 5,000 Units by mouth daily.    [provider]  fluconazole (DIFLUCAN) 150 MG tablet TAKE ONE TABLET BY MOUTH AS A ONE-TIME DOSE AS NEEDED Patient not taking: Reported on 10/01/2018 02/16/18   Plotnikov, Evie Lacks, MD  fluticasone (FLONASE) 50 MCG/ACT nasal spray Place 2 sprays into the nose daily as needed for rhinitis. Reported on 01/23/2016    [provider]  Loel Ro COMPLEX PO Take 1 capsule by mouth daily.    [provider]  glucose blood (ONE TOUCH ULTRA TEST) test strip USE 1 STRIP TO CHECK GLUCOSE TWICE DAILY 10/01/17   Plotnikov, Evie Lacks, MD  insulin NPH Human (HUMULIN N) 100 UNIT/ML injection Use 40 u in am and 10 u qhs. Titrate up by 1 unit a day each (AM and HS doses) for goal  sugars of 100-130 up to 60 units in AM and 20 u at HS 03/13/17   Plotnikov, Evie Lacks, MD  Insulin Syringe-Needle U-100 (B-D INS SYRINGE 0.5CC/31GX5/16) 31G X 5/16" 0.5 ML MISC Use qd 09/16/16   Plotnikov, Evie Lacks, MD  ipratropium-albuterol (DUONEB) 0.5-2.5 (3) MG/3ML SOLN Inhale 3 mLs into the lungs as needed. 12/29/17   [name], NP  loratadine (CLARITIN) 10 MG tablet Take 1 tablet (10 mg total) by mouth daily. 03/13/17   Plotnikov, Evie Lacks, MD  metoprolol tartrate (LOPRESSOR) 50 MG tablet Take 1 tablet (50 mg total) by mouth once for 1 dose. Take 1 hour prior to your coronary CT. 01/29/18 01/29/18  Dorothy Spark, MD  ONE TOUCH LANCETS MISC 1 each by Does not apply route 2 (two) times daily. 01/26/16   Plotnikov, Tyrone Apple  V, MD  rosuvastatin (CRESTOR) 5 MG tablet Take 1 tablet (5 mg total) by mouth daily. 01/29/18 04/29/18  Dorothy Spark, MD  tiZANidine (ZANAFLEX) 4 MG tablet Take 1 tablet (4 mg total) by mouth every 6 (six) hours as needed for muscle spasms. 04/25/17   Biagio Borg, MD  traMADol (ULTRAM) 50 MG tablet Take 1 tablet (50 mg total) by mouth every 8 (eight) hours as needed. 04/25/17   Biagio Borg, MD    Physical Exam: There were no vitals filed for this visit.  Constitutional: NAD, calm  Eyes: PERTLA, lids and conjunctivae normal ENMT: Mucous membranes are moist. Posterior pharynx clear of any exudate or lesions.   Neck: normal, supple, no masses, no thyromegaly Respiratory: clear to auscultation bilaterally, no wheezing, no crackles. Normal respiratory effort.    Cardiovascular: S1 & S2 heard, regular rate and rhythm. No extremity edema.   Abdomen: Soft, tender in RUQ, no rebound pain or guarding. Bowel sounds active.  Musculoskeletal: no clubbing / cyanosis. No joint deformity upper and lower extremities.    Skin: no significant rashes, lesions, ulcers. Warm, dry, well-perfused. Neurologic: CN 2-12 grossly intact. Sensation intact. Strength 5/5 in all 4 limbs.    Psychiatric:  Alert and oriented x 3. Calm, cooperative, upset about recommendation for NPO.    Labs on Admission: I have personally reviewed following labs and imaging studies  CBC: No results for input(s): WBC, NEUTROABS, HGB, HCT, MCV, PLT in the last 168 hours. Basic Metabolic Panel: No results for input(s): NA, K, CL, CO2, GLUCOSE, BUN, CREATININE, CALCIUM, MG, PHOS in the last 168 hours. GFR: CrCl cannot be calculated (Patient's most recent lab result is older than the maximum 21 days allowed.). Liver Function Tests: No results for input(s): AST, ALT, ALKPHOS, BILITOT, PROT, ALBUMIN in the last 168 hours. No results for input(s): LIPASE, AMYLASE in the last 168 hours. No results for input(s): AMMONIA in the last 168 hours. Coagulation Profile: No results for input(s): INR, PROTIME in the last 168 hours. Cardiac Enzymes: No results for input(s): CKTOTAL, CKMB, CKMBINDEX, TROPONINI in the last 168 hours. BNP (last 3 results) No results for input(s): PROBNP in the last 8760 hours. HbA1C: No results for input(s): HGBA1C in the last 72 hours. CBG: No results for input(s): GLUCAP in the last 168 hours. Lipid Profile: No results for input(s): CHOL, HDL, LDLCALC, TRIG, CHOLHDL, LDLDIRECT in the last 72 hours. Thyroid Function Tests: No results for input(s): TSH, T4TOTAL, FREET4, T3FREE, THYROIDAB in the last 72 hours. Anemia Panel: No results for input(s): VITAMINB12, FOLATE, FERRITIN, TIBC, IRON, RETICCTPCT in the last 72 hours. Urine analysis:    Component Value Date/Time   COLORURINE YELLOW 12/12/2016 [place] 12/12/2016 0853   LABSPEC 1.020 12/12/2016 0853   PHURINE 5.5 12/12/2016 0853   GLUCOSEU >=1000 (A) 12/12/2016 0853   HGBUR NEGATIVE 12/12/2016 0853   BILIRUBINUR NEGATIVE 12/12/2016 0853   KETONESUR NEGATIVE 12/12/2016 0853   PROTEINUR 30 (A) 04/24/2013 1320   UROBILINOGEN 0.2 12/12/2016 0853   NITRITE NEGATIVE 12/12/2016 0853   LEUKOCYTESUR  NEGATIVE 12/12/2016 0853   Sepsis Labs: _0 (procalcitonin:4,lacticidven:4) )No results found for this or any previous visit (from the past 240 hour(s)).   Radiological Exams on Admission: No results found.  EKG: Not performed.   Assessment/Plan   1. Acute biliary pancreatitis; suspected cholangitis    - Presents with 3 days of RUQ pain, N/V, and chills  - Found to have lipase 9599 with AST 641,  ALT 182, and t bili 2.4  - CT findings equivocal for acute cholecystitis  - She was treated with 2 liters NS, Levaquin, Flagyl, morphine, and Zofran in ED  - Check RUQ [place], culture blood, continue bowel-rest and IVF hydration, continue antibiotics with Zosyn    2. COPD  - No SOB or wheezing  - Continue Trelegy and as-needed albuterol    3. Type II DM  - A1c was 9.7% a year ago  - Managed at home with Lantus 35 units qD and Humulin N 6-12 units qHS  - She is NPO on admission - Check CBG's, continue Lantus with 10 units to start plus sliding-scale Novolog  4. Thrombocytosis  - Chronic, stable, s/p splenectomy, monitor    DVT prophylaxis: Lovenox  Code Status: Full  Family Communication: Discussed with patient  Consults called: none Admission status: Inpatient     Vianne Bulls, MD [facility] Pager [phone]  If 7PM-7AM, please contact night-coverage [email or web address] Password [facility]  10/01/2018, 7:43 PM

## 2018-10-01 NOTE — Plan of Care (Signed)
°  Problem: Coping: °Goal: Level of anxiety will decrease °Outcome: Progressing °  °

## 2018-10-02 ENCOUNTER — Inpatient Hospital Stay (HOSPITAL_COMMUNITY): Payer: Medicare HMO

## 2018-10-02 ENCOUNTER — Other Ambulatory Visit: Payer: Self-pay

## 2018-10-02 DIAGNOSIS — K8000 Calculus of gallbladder with acute cholecystitis without obstruction: Secondary | ICD-10-CM

## 2018-10-02 DIAGNOSIS — R945 Abnormal results of liver function studies: Secondary | ICD-10-CM

## 2018-10-02 LAB — CBC WITH DIFFERENTIAL/PLATELET
ABS IMMATURE GRANULOCYTES: 0.17 10*3/uL — AB (ref 0.00–0.07)
BASOS ABS: 0 10*3/uL (ref 0.0–0.1)
Basophils Relative: 0 %
Eosinophils Absolute: 0.3 10*3/uL (ref 0.0–0.5)
Eosinophils Relative: 2 %
HCT: 33.2 % — ABNORMAL LOW (ref 36.0–46.0)
HEMOGLOBIN: 9.9 g/dL — AB (ref 12.0–15.0)
IMMATURE GRANULOCYTES: 1 %
LYMPHS PCT: 25 %
Lymphs Abs: 3.2 10*3/uL (ref 0.7–4.0)
MCH: 27.2 pg (ref 26.0–34.0)
MCHC: 29.8 g/dL — ABNORMAL LOW (ref 30.0–36.0)
MCV: 91.2 fL (ref 80.0–100.0)
MONOS PCT: 11 %
Monocytes Absolute: 1.3 10*3/uL — ABNORMAL HIGH (ref 0.1–1.0)
NEUTROS ABS: 7.8 10*3/uL — AB (ref 1.7–7.7)
NEUTROS PCT: 61 %
NRBC: 0 % (ref 0.0–0.2)
Platelets: 451 10*3/uL — ABNORMAL HIGH (ref 150–400)
RBC: 3.64 MIL/uL — ABNORMAL LOW (ref 3.87–5.11)
RDW: 16.2 % — AB (ref 11.5–15.5)
WBC: 12.8 10*3/uL — ABNORMAL HIGH (ref 4.0–10.5)

## 2018-10-02 LAB — LIPASE, BLOOD: Lipase: 212 U/L — ABNORMAL HIGH (ref 11–51)

## 2018-10-02 LAB — COMPREHENSIVE METABOLIC PANEL
ALBUMIN: 2.8 g/dL — AB (ref 3.5–5.0)
ALK PHOS: 150 U/L — AB (ref 38–126)
ALT: 184 U/L — ABNORMAL HIGH (ref 0–44)
ALT: 176 U/L — ABNORMAL HIGH (ref 0–44)
ANION GAP: 7 (ref 5–15)
ANION GAP: 8 (ref 5–15)
AST: 265 U/L — ABNORMAL HIGH (ref 15–41)
AST: 219 U/L — AB (ref 15–41)
Albumin: 2.6 g/dL — ABNORMAL LOW (ref 3.5–5.0)
Alkaline Phosphatase: 136 U/L — ABNORMAL HIGH (ref 38–126)
BUN: 13 mg/dL (ref 8–23)
BUN: 14 mg/dL (ref 8–23)
CALCIUM: 8 mg/dL — AB (ref 8.9–10.3)
CHLORIDE: 108 mmol/L (ref 98–111)
CO2: 23 mmol/L (ref 22–32)
CO2: 22 mmol/L (ref 22–32)
Calcium: 7.8 mg/dL — ABNORMAL LOW (ref 8.9–10.3)
Chloride: 107 mmol/L (ref 98–111)
Creatinine, Ser: 0.8 mg/dL (ref 0.44–1.00)
Creatinine, Ser: 0.9 mg/dL (ref 0.44–1.00)
GFR calc Af Amer: >60 mL/min (ref >60)
GFR calc non Af Amer: >60 mL/min (ref >60)
GFR calc non Af Amer: >60 mL/min (ref >60)
GLUCOSE: 144 mg/dL — AB (ref 70–99)
GLUCOSE: 126 mg/dL — AB (ref 70–99)
Potassium: 4.3 mmol/L (ref 3.5–5.1)
Potassium: 4.1 mmol/L (ref 3.5–5.1)
SODIUM: 138 mmol/L (ref 135–145)
SODIUM: 137 mmol/L (ref 135–145)
Total Bilirubin: 4.4 mg/dL — ABNORMAL HIGH (ref 0.3–1.2)
Total Bilirubin: 4.5 mg/dL — ABNORMAL HIGH (ref 0.3–1.2)
Total Protein: 6.1 g/dL — ABNORMAL LOW (ref 6.5–8.1)
Total Protein: 6.5 g/dL (ref 6.5–8.1)

## 2018-10-02 LAB — GLUCOSE, CAPILLARY
GLUCOSE-CAPILLARY: 117 mg/dL — AB (ref 70–99)
Glucose-Capillary: 112 mg/dL — ABNORMAL HIGH (ref 70–99)
Glucose-Capillary: 137 mg/dL — ABNORMAL HIGH (ref 70–99)
Glucose-Capillary: 148 mg/dL — ABNORMAL HIGH (ref 70–99)
Glucose-Capillary: 238 mg/dL — ABNORMAL HIGH (ref 70–99)
Glucose-Capillary: 90 mg/dL (ref 70–99)

## 2018-10-02 MED ORDER — GADOBUTROL 1 MMOL/ML IV SOLN
6.0000 mL | Freq: Once | INTRAVENOUS | Status: AC | PRN
  Administered 2018-10-02: 6 mL via INTRAVENOUS

## 2018-10-02 MED ORDER — BENZONATATE 100 MG PO CAPS
200.0000 mg | ORAL_CAPSULE | Freq: Three times a day (TID) | ORAL | Status: DC | PRN
  Administered 2018-10-02: 200 mg via ORAL
  Filled 2018-10-02: qty 2

## 2018-10-02 NOTE — Consult Note (Addendum)
Referring Provider: [facility]  Primary Care Physician:  Cassandria Anger, MD Primary Gastroenterologist:  Scarlette Shorts, MD Reason for Consultation:  Biliary pancreatitis    ASSESSMENT    60.  72 year old female with pancreatitis, most likely biliary.  Fever resolved.  White count improving.  Ultrasound shows shows cholelithiasis, borderline gallbladder wall thickness, no evidence for choledocholithiasis with CBD only only 1-2 mm. -Surgical consult was already called.  -She could have a cholecystectomy with IOC but also not unreasonable to proceed with MRCP to make sure duct is clear of stones. Will arrange for MRCP while awaiting Surgical evaluation.   -am liver tests  2. Normocytic anemia.  I suspect this is really just delutional.  Her hemoglobin in 2018 was 12.1, down to 9.9 today after aggressive fluid resuscitation.   3. COPD  4. GERD, stable on PPI  5. Remote splenectomy . Apparently done for persistent thrombocytopenia.      Attending Physician Note   I have taken a history, examined the patient and reviewed the chart. I agree with the Advanced Practitioner's note, impression and recommendations.   * Acute mild pancreatitis, likely biliary.  * Symptomatic cholelithiasis, possible cholecystitis.  * Elevated LFTs however CBD is only 1-2 mm. Surgical consult.  Consider lap chole with IOC or MRCP to further evaluate for choledocholithiasis.  * Normocytic anemia  Lucio Edward, MD [facility] [phone]      HPI: Pam Gamble is a 72 y.o. female with COPD, DM 2 and hypertension.  She was transferred here from [facility] ED after presenting there with RUQ pain, nausea vomiting and chills.  Liver tests were found to be elevated with AST 641, ALT 182, T bilirubin 2.4. Wbc was 15.7 and lipase nearly 10,000.  She was febrile, tachycardic . Given 2 L of normal saline in the ED with resolution of tachycardia.Marland Kitchen  She was started on IV antibiotics in the ED. she is currently  getting Zosyn.  White count today is down to 12.8.  Lipase 212. Alk phos 136, AST down to 265, ALT about the same at 184, T bili 4.4, up from 2.4 yesterday.  RUQ U/S   Gallbladder filled with gallstones/sludge, wall borderline thickened.  CBD 1 to 2 mm.  Possible early fatty infiltration.  Patient developed nausea on Tuesday, she vomited Wednesday.  Her right upper quadrant pain started sometime on Wednesday and became progressively worse.  She does not think it really radiated through to her back.  The pain was constant.  No fevers at home, says those started at the hospital.  She is never had this type of pain before.  Other than GERD, patient has no chronic GI complaints.  She has not had a colonoscopy in probably 15 to 20 years.    Past Medical History:  Diagnosis Date  . Barrett's esophagus   . Calcium deposits in tendon and bursa   . Chronic airway obstruction, not elsewhere classified   . COPD (chronic obstructive pulmonary disease) ([place])   . Depression   . Diabetes mellitus 2008ish   type two  . Esophageal reflux   . Idiopathic thrombocythemia   . Kidney stones   . Lateral epicondylitis  of elbow   . Personal history of malignant neoplasm of other parts of uterus   . Personal history of other diseases of digestive system   . Shortness of breath    upon exertion    Past Surgical History:  Procedure Laterality Date  . ABDOMINAL HYSTERECTOMY    .  CATARACT EXTRACTION Bilateral 01/2016 and 03/2016  . LITHOTRIPSY    . REFRACTIVE SURGERY Right 05/16/2016  . SPLENECTOMY  1999  . TONSILLECTOMY AND ADENOIDECTOMY      Prior to Admission medications   Medication Sig Start Date End Date Taking? Authorizing Provider  albuterol (PROVENTIL HFA;VENTOLIN HFA) 108 (90 Base) MCG/ACT inhaler Inhale 2 puffs into the lungs every 6 (six) hours as needed for wheezing or shortness of breath. 02/09/18  Yes [name], NP  Cholecalciferol (VITAMIN D-3) 125 MCG (5000 UT) TABS Take 125 mcg by  mouth daily.   Yes [provider]  fluticasone (FLONASE) 50 MCG/ACT nasal spray Place 2 sprays into the nose daily as needed for rhinitis. Reported on 01/23/2016   Yes [provider]  Fluticasone-Umeclidin-Vilant (TRELEGY ELLIPTA) 100-62.5-25 MCG/INH AEPB Inhale 1 puff into the lungs daily. 02/09/18  Yes [name], NP  insulin glargine (LANTUS) 100 unit/mL SOPN Inject 35 Units into the skin daily.   Yes [provider]  insulin NPH Human (HUMULIN N) 100 UNIT/ML injection Use 40 u in am and 10 u qhs. Titrate up by 1 unit a day each (AM and HS doses) for goal sugars of 100-130 up to 60 units in AM and 20 u at HS Patient taking differently: Inject 6-12 Units into the skin at bedtime. 181-240 6 units 241-300 8 units 301-350 10 units 351-400 12 units 03/13/17  Yes Plotnikov, Evie Lacks, MD  ipratropium-albuterol (DUONEB) 0.5-2.5 (3) MG/3ML SOLN Inhale 3 mLs into the lungs as needed. 12/29/17  Yes [name], NP  levocetirizine (XYZAL) 5 MG tablet Take 5 mg by mouth at bedtime. 09/04/18  Yes [provider]  metFORMIN (GLUCOPHAGE) 1000 MG tablet TAKE ONE TABLET BY MOUTH TWICE DAILY WITH MEALS Patient taking differently: Take 1,000 mg by mouth 2 (two) times daily with a meal.  10/01/17  Yes Plotnikov, Evie Lacks, MD  montelukast (SINGULAIR) 10 MG tablet Take 10 mg by mouth daily. 08/28/18  Yes [provider]  naproxen sodium (ALEVE) 220 MG tablet Take 440 mg by mouth as needed (pain/headache).   Yes [provider]  omeprazole (PRILOSEC) 40 MG capsule Take 1 capsule (40 mg total) by mouth daily. 01/02/16  Yes Plotnikov, Evie Lacks, MD  OVER THE COUNTER MEDICATION Apply 1 application topically as needed (pain).   Yes [provider]  Polyethyl Glycol-Propyl Glycol (SYSTANE) 0.4-0.3 % GEL ophthalmic gel Place 1-2 application into both eyes as needed (dry eyes).   Yes [provider]  albuterol (PROVENTIL HFA;VENTOLIN HFA) 108 (90  BASE) MCG/ACT inhaler Inhale 2 puffs into the lungs every 6 (six) hours as needed for wheezing. Patient not taking: Reported on 10/01/2018 07/27/14   Plotnikov, Evie Lacks, MD  Blood Glucose Monitoring Suppl (ONE TOUCH ULTRA SYSTEM KIT) w/Device KIT 1 kit by Does not apply route once. 01/26/16   Plotnikov, Evie Lacks, MD  Cetirizine HCl (ZYRTEC PO) Take 1 tablet by mouth daily.    [provider]  cholecalciferol (VITAMIN D) 1000 UNITS tablet Take 5,000 Units by mouth daily.    [provider]  fluconazole (DIFLUCAN) 150 MG tablet TAKE ONE TABLET BY MOUTH AS A ONE-TIME DOSE AS NEEDED Patient not taking: Reported on 10/01/2018 02/16/18   Plotnikov, Evie Lacks, MD  glucose blood (ONE TOUCH ULTRA TEST) test strip USE 1 STRIP TO CHECK GLUCOSE TWICE DAILY 10/01/17   Plotnikov, Evie Lacks, MD  Insulin Syringe-Needle U-100 (B-D INS SYRINGE 0.5CC/31GX5/16) 31G X 5/16" 0.5 ML  MISC Use qd 09/16/16   Plotnikov, Evie Lacks, MD  loratadine (CLARITIN) 10 MG tablet Take 1 tablet (10 mg total) by mouth daily. Patient not taking: Reported on 10/01/2018 03/13/17   Plotnikov, Evie Lacks, MD  metoprolol tartrate (LOPRESSOR) 50 MG tablet Take 1 tablet (50 mg total) by mouth once for 1 dose. Take 1 hour prior to your coronary CT. 01/29/18 01/29/18  Dorothy Spark, MD  ONE TOUCH LANCETS MISC 1 each by Does not apply route 2 (two) times daily. 01/26/16   Plotnikov, Evie Lacks, MD  rosuvastatin (CRESTOR) 5 MG tablet Take 1 tablet (5 mg total) by mouth daily. 01/29/18 04/29/18  Dorothy Spark, MD  tiZANidine (ZANAFLEX) 4 MG tablet Take 1 tablet (4 mg total) by mouth every 6 (six) hours as needed for muscle spasms. Patient not taking: Reported on 10/01/2018 04/25/17   Biagio Borg, MD  traMADol (ULTRAM) 50 MG tablet Take 1 tablet (50 mg total) by mouth every 8 (eight) hours as needed. Patient not taking: Reported on 10/01/2018 04/25/17   Biagio Borg, MD    Current Facility-Administered Medications  Medication Dose  Route Frequency Provider Last Rate Last Dose  . acetaminophen (TYLENOL) tablet 650 mg  650 mg Oral Q6H PRN Opyd, Ilene Qua, MD       Or  . acetaminophen (TYLENOL) suppository 650 mg  650 mg Rectal Q6H PRN Opyd, Ilene Qua, MD      . albuterol (PROVENTIL) (2.5 MG/3ML) 0.083% nebulizer solution 3 mL  3 mL Inhalation Q6H PRN Opyd, Ilene Qua, MD      . enoxaparin (LOVENOX) injection 40 mg  40 mg Subcutaneous Q24H Opyd, Ilene Qua, MD   40 mg at 10/01/18 2146  . famotidine (PEPCID) IVPB 20 mg premix  20 mg Intravenous Q12H Opyd, Ilene Qua, MD 100 mL/hr at 10/02/18 0935 20 mg at 10/02/18 0935  . fentaNYL (SUBLIMAZE) injection 12.5-25 mcg  12.5-25 mcg Intravenous Q2H PRN Vianne Bulls, MD   25 mcg at 10/02/18 0845  . fluticasone furoate-vilanterol (BREO ELLIPTA) 100-25 MCG/INH 1 puff  1 puff Inhalation Daily Opyd, Ilene Qua, MD   1 puff at 10/02/18 0723  . insulin aspart (novoLOG) injection 0-9 Units  0-9 Units Subcutaneous Q4H Opyd, Ilene Qua, MD   1 Units at 10/02/18 [phone]  . insulin glargine (LANTUS) injection 10 Units  10 Units Subcutaneous Daily Opyd, Ilene Qua, MD   10 Units at 10/02/18 0944  . ondansetron (ZOFRAN) tablet 4 mg  4 mg Oral Q6H PRN Opyd, Ilene Qua, MD       Or  . ondansetron (ZOFRAN) injection 4 mg  4 mg Intravenous Q6H PRN Opyd, Ilene Qua, MD      . piperacillin-tazobactam (ZOSYN) IVPB 3.375 g  3.375 g Intravenous Q8H Glogovac, Nikola, RPH 12.5 mL/hr at 10/02/18 0826    . polyvinyl alcohol (LIQUIFILM TEARS) 1.4 % ophthalmic solution 1-2 drop  1-2 drop Both Eyes PRN Opyd, Ilene Qua, MD      . umeclidinium bromide (INCRUSE ELLIPTA) 62.5 MCG/INH 1 puff  1 puff Inhalation Daily Opyd, Ilene Qua, MD   1 puff at 10/02/18 0722    Allergies as of 10/01/2018 - Review Complete 10/01/2018  Allergen Reaction Noted  . Amoxicillin-pot clavulanate Nausea Only and Other (See Comments) 12/30/2008  . Ertugliflozin  09/16/2017  . Levaquin [levofloxacin] Nausea Only 10/09/2016    Family History    Problem Relation Age of Onset  . Liver cancer Sister   . Pancreatic  cancer Sister   . Stroke Mother   . Heart disease Mother        CHF  . Heart disease Father        MI  . Diabetes Father   . Diabetes Sister   . Diabetes Maternal Aunt     Social History   Socioeconomic History  . Marital status: Single    Spouse name: Not on file  . Number of children: 0  . Years of education: Not on file  . Highest education level: Not on file  Occupational History  . Occupation: retired    Fish farm manager: Aeronautical engineer  Social Needs  . Financial resource strain: Not on file  . Food insecurity:    Worry: Not on file    Inability: Not on file  . Transportation needs:    Medical: Not on file    Non-medical: Not on file  Tobacco Use  . Smoking status: Former Smoker    Packs/day: 2.00    Years: 49.00    Pack years: 98.00    Types: Cigarettes    Last attempt to quit: 11/11/2005    Years since quitting: 12.8  . Smokeless tobacco: Never Used  Substance and Sexual Activity  . Alcohol use: Yes    Alcohol/week: 0.0 standard drinks    Comment: beer, whiskey, margherita  . Drug use: No  . Sexual activity: Not Currently  Lifestyle  . Physical activity:    Days per week: Not on file    Minutes per session: Not on file  . Stress: Not on file  Relationships  . Social connections:    Talks on phone: Not on file    Gets together: Not on file    Attends religious service: Not on file    Active member of club or organization: Not on file    Attends meetings of clubs or organizations: Not on file    Relationship status: Not on file  . Intimate partner violence:    Fear of current or ex partner: Not on file    Emotionally abused: Not on file    Physically abused: Not on file    Forced sexual activity: Not on file  Other Topics Concern  . Not on file  Social History Narrative   Retired - Chief Operating Officer   Lives alone   Single    Review of Systems: All systems reviewed and  negative except where noted in HPI.  Physical Exam: Vital signs in last 24 hours: Temp:  [98.7 F (37.1 C)-100.1 F (37.8 C)] 98.7 F (37.1 C) (11/22 0452) Pulse Rate:  [79-98] 79 (11/22 0452) Resp:  [18] 18 (11/22 0452) BP: (107)/(54-83) 107/54 (11/22 0452) SpO2:  [96 %] 96 % (11/22 0725) Weight:  [69.9 kg-73.8 kg] 69.9 kg (11/22 0452) Last BM Date: 10/01/18 General:   Alert, well-developed, female in NAD Psych:  Pleasant, cooperative. Normal mood and affect. Eyes:  Pupils equal, sclera clear, no icterus.   Conjunctiva pink. Ears:  Normal auditory acuity. Nose:  No deformity, discharge,  or lesions. Neck:  Supple; no masses Lungs:  Occas wheeze in chest, o/w clear throughout to auscultation.  Heart:  Regular rate and rhythm,  no lower extremity edema Abdomen:  Soft, mildly distended, tynpanitis, mild RUQ tenderness.  BS active    Rectal:  Deferred  Msk:  Symmetrical without gross deformities. . Neurologic:  Alert and  oriented x4;  grossly normal neurologically. Skin:  Intact without significant lesions or rashes.  Intake/Output from previous day: 11/21 0701 - 11/22 0700 In: 572.6 [I.V.:472.5; IV Piggyback:100.1] Out: -  Intake/Output this shift: Total I/O In: 554.7 [I.V.:515.4; IV Piggyback:39.4] Out: -   Lab Results: Recent Labs    10/02/18 0619  WBC 12.8*  HGB 9.9*  HCT 33.2*  PLT 451*   BMET Recent Labs    10/02/18 0619  NA 138  K 4.3  CL 108  CO2 23  GLUCOSE 144*  BUN 13  CREATININE 0.80  CALCIUM 7.8*   LFT Recent Labs    10/02/18 0619  PROT 6.1*  ALBUMIN 2.6*  AST 265*  ALT 184*  ALKPHOS 136*  BILITOT 4.4*     Studies/Results: US Abdomen Limited Ruq  Result Date: 10/02/2018 CLINICAL DATA:  Acute biliary pancreatitis. EXAM: ULTRASOUND ABDOMEN LIMITED RIGHT UPPER QUADRANT COMPARISON:  CT 10/01/2018 FINDINGS: Gallbladder: Gallbladder filled with gallstones and debris/sludge. Gallbladder wall is borderline thickened at 3.5 mm. Negative  sonographic Murphy's. Common bile duct: Diameter: Normal caliber, 1-2 mm. Liver: Mild diffusely increased echotexture suggesting early fatty infiltration. No focal hepatic abnormality. Portal vein is patent on color Doppler imaging with normal direction of blood flow towards the liver. IMPRESSION: Gallbladder filled with gallstones and sludge. Borderline gallbladder wall thickness. No sonographic Murphy's sign. Early fatty infiltration of the liver. Electronically Signed   By: Rolm Baptise M.D.   On: 10/02/2018 09:36     Tye Savoy, NP-C @  10/02/2018, 10:00 AM

## 2018-10-02 NOTE — Plan of Care (Signed)
  Problem: Safety: Goal: Ability to remain free from injury will improve Outcome: Progressing   

## 2018-10-02 NOTE — Plan of Care (Signed)
  Problem: Nutrition: Goal: Adequate nutrition will be maintained Outcome: Progressing   Problem: Elimination: Goal: Will not experience complications related to bowel motility Outcome: Progressing   Problem: Pain Managment: Goal: General experience of comfort will improve Outcome: Progressing   

## 2018-10-02 NOTE — Consult Note (Addendum)
[facility] Surgery Consult/Admission Note  Pam Gamble 1946/10/18  [id number].    Requesting MD: Dr. Tawanna Solo Chief Complaint/Reason for Consult: gallstone pancreatitis   HPI:   Pt is a 72 yo female with a hx of COPD, type II Dm, HTN, HLD, who presented to [facility] ED with RUQ abdominal pain. Pt states pain woke her at 0200 Thursday morning. Pain in the RUQ, constant, severe, sharp, radiating into her back with associated nausea, vomiting, and chills. She states she has had episodes of pain like this before but they resolved. This time the pain was not going away. She denies diarrhea, CP, SOB, fever, blood in her vomit or stools. Surgical hx positive for splenectomy and abdominal hysterectomy. No anticoagulation.  Labs: Alk phos 136, Lipase 212, AST 265, ALT 184, Tblili 4.4, WBC 12.8 [place]: Gallbladder filled with gallstones and sludge. Borderline gallbladder wall thickness. No sonographic Murphy's sign   ROS:  Review of Systems  Constitutional: Positive for chills. Negative for diaphoresis and fever.  HENT: Negative for sore throat.   Respiratory: Negative for cough and shortness of breath.   Cardiovascular: Negative for chest pain.  Gastrointestinal: Positive for abdominal pain, nausea and vomiting. Negative for blood in stool, constipation and diarrhea.  Genitourinary: Negative for dysuria.  Skin: Negative for rash.  Neurological: Negative for dizziness and loss of consciousness.  All other systems reviewed and are negative.    Family History  Problem Relation Age of Onset  . Liver cancer Sister   . Pancreatic cancer Sister   . Stroke Mother   . Heart disease Mother        CHF  . Heart disease Father        MI  . Diabetes Father   . Diabetes Sister   . Diabetes Maternal Aunt     Past Medical History:  Diagnosis Date  . Barrett's esophagus   . Calcium deposits in tendon and bursa   . Chronic airway obstruction, not elsewhere classified   . COPD (chronic  obstructive pulmonary disease) ([place])   . Depression   . Diabetes mellitus 2008ish   type two  . Esophageal reflux   . Idiopathic thrombocythemia   . Kidney stones   . Lateral epicondylitis  of elbow   . Personal history of malignant neoplasm of other parts of uterus   . Personal history of other diseases of digestive system   . Shortness of breath    upon exertion    Past Surgical History:  Procedure Laterality Date  . ABDOMINAL HYSTERECTOMY    . CATARACT EXTRACTION Bilateral 01/2016 and 03/2016  . LITHOTRIPSY    . REFRACTIVE SURGERY Right 05/16/2016  . SPLENECTOMY  1999  . TONSILLECTOMY AND ADENOIDECTOMY      Social History:  reports that she quit smoking about 12 years ago. Her smoking use included cigarettes. She has a 98.00 pack-year smoking history. She has never used smokeless tobacco. She reports that she drinks alcohol. She reports that she does not use drugs.  Allergies:  Allergies  Allergen Reactions  . Amoxicillin-Pot Clavulanate Nausea Only and Other (See Comments)    REACTION: diarrhea "sick to stomach" Has patient had a PCN reaction causing immediate rash, facial/tongue/throat swelling, SOB or lightheadedness with hypotension: no Has patient had a PCN reaction causing severe rash involving mucus membranes or skin necrosis: no Has patient had a PCN reaction that required hospitalization: no Has patient had a PCN reaction occurring within the last 10 years: no If all of the  above answers are "NO", then may proceed with Cephalosporin use.   . Ertugliflozin     Steglatro -  - "it made me feel funny"  . Levaquin [Levofloxacin] Nausea Only    Facility-Administered Medications Prior to Admission  Medication Dose Route Frequency Provider Last Rate Last Dose  . ipratropium-albuterol (DUONEB) 0.5-2.5 (3) MG/3ML nebulizer solution 3 mL  3 mL Nebulization Once Nche, Charlene Brooke, NP       Medications Prior to Admission  Medication Sig Dispense Refill  . albuterol  (PROVENTIL HFA;VENTOLIN HFA) 108 (90 Base) MCG/ACT inhaler Inhale 2 puffs into the lungs every 6 (six) hours as needed for wheezing or shortness of breath. 1 Inhaler 5  . Cholecalciferol (VITAMIN D-3) 125 MCG (5000 UT) TABS Take 125 mcg by mouth daily.    . fluticasone (FLONASE) 50 MCG/ACT nasal spray Place 2 sprays into the nose daily as needed for rhinitis. Reported on 01/23/2016    . Fluticasone-Umeclidin-Vilant (TRELEGY ELLIPTA) 100-62.5-25 MCG/INH AEPB Inhale 1 puff into the lungs daily. 1 each 5  . insulin glargine (LANTUS) 100 unit/mL SOPN Inject 35 Units into the skin daily.    . insulin NPH Human (HUMULIN N) 100 UNIT/ML injection Use 40 u in am and 10 u qhs. Titrate up by 1 unit a day each (AM and HS doses) for goal sugars of 100-130 up to 60 units in AM and 20 u at HS (Patient taking differently: Inject 6-12 Units into the skin at bedtime. 181-240 6 units 241-300 8 units 301-350 10 units 351-400 12 units) 10 mL 11  . ipratropium-albuterol (DUONEB) 0.5-2.5 (3) MG/3ML SOLN Inhale 3 mLs into the lungs as needed. 360 mL 1  . levocetirizine (XYZAL) 5 MG tablet Take 5 mg by mouth at bedtime.  0  . metFORMIN (GLUCOPHAGE) 1000 MG tablet TAKE ONE TABLET BY MOUTH TWICE DAILY WITH MEALS (Patient taking differently: Take 1,000 mg by mouth 2 (two) times daily with a meal. ) 180 tablet 3  . montelukast (SINGULAIR) 10 MG tablet Take 10 mg by mouth daily.  3  . naproxen sodium (ALEVE) 220 MG tablet Take 440 mg by mouth as needed (pain/headache).    Marland Kitchen omeprazole (PRILOSEC) 40 MG capsule Take 1 capsule (40 mg total) by mouth daily. 90 capsule 3  . OVER THE COUNTER MEDICATION Apply 1 application topically as needed (pain).    Vladimir Faster Glycol-Propyl Glycol (SYSTANE) 0.4-0.3 % GEL ophthalmic gel Place 1-2 application into both eyes as needed (dry eyes).    Marland Kitchen albuterol (PROVENTIL HFA;VENTOLIN HFA) 108 (90 BASE) MCG/ACT inhaler Inhale 2 puffs into the lungs every 6 (six) hours as needed for wheezing. (Patient  not taking: Reported on 10/01/2018) 1 Inhaler 5  . Blood Glucose Monitoring Suppl (ONE TOUCH ULTRA SYSTEM KIT) w/Device KIT 1 kit by Does not apply route once. 1 each 0  . Cetirizine HCl (ZYRTEC PO) Take 1 tablet by mouth daily.    . cholecalciferol (VITAMIN D) 1000 UNITS tablet Take 5,000 Units by mouth daily.    . fluconazole (DIFLUCAN) 150 MG tablet TAKE ONE TABLET BY MOUTH AS A ONE-TIME DOSE AS NEEDED (Patient not taking: Reported on 10/01/2018) 3 tablet 1  . glucose blood (ONE TOUCH ULTRA TEST) test strip USE 1 STRIP TO CHECK GLUCOSE TWICE DAILY 200 each 1  . Insulin Syringe-Needle U-100 (B-D INS SYRINGE 0.5CC/31GX5/16) 31G X 5/16" 0.5 ML MISC Use qd 100 each 3  . loratadine (CLARITIN) 10 MG tablet Take 1 tablet (10 mg total) by  mouth daily. (Patient not taking: Reported on 10/01/2018) 100 tablet 3  . metoprolol tartrate (LOPRESSOR) 50 MG tablet Take 1 tablet (50 mg total) by mouth once for 1 dose. Take 1 hour prior to your coronary CT. 1 tablet 0  . ONE TOUCH LANCETS MISC 1 each by Does not apply route 2 (two) times daily. 100 each 3  . rosuvastatin (CRESTOR) 5 MG tablet Take 1 tablet (5 mg total) by mouth daily. 90 tablet 3  . tiZANidine (ZANAFLEX) 4 MG tablet Take 1 tablet (4 mg total) by mouth every 6 (six) hours as needed for muscle spasms. (Patient not taking: Reported on 10/01/2018) 40 tablet 0  . traMADol (ULTRAM) 50 MG tablet Take 1 tablet (50 mg total) by mouth every 8 (eight) hours as needed. (Patient not taking: Reported on 10/01/2018) 50 tablet 0    Blood pressure (!) 107/54, pulse 79, temperature 98.7 F (37.1 C), temperature source Oral, resp. rate 18, height '5\' 2"'  (1.575 m), weight 69.9 kg, SpO2 96 %.  Physical Exam  Constitutional: She is oriented to person, place, and time. She appears well-developed and well-nourished. No distress.  HENT:  Head: Normocephalic and atraumatic.  Nose: Nose normal.  Mouth/Throat: Uvula is midline, oropharynx is clear and moist and mucous  membranes are normal. No oropharyngeal exudate.  Eyes: Pupils are equal, round, and reactive to light. Conjunctivae are normal. Right eye exhibits no discharge. Left eye exhibits no discharge. No scleral icterus.  Neck: Normal range of motion. Neck supple. No thyromegaly present.  Cardiovascular: Normal rate, regular rhythm, normal heart sounds and intact distal pulses.  No murmur heard. Pulses:      Radial pulses are 2+ on the right side, and 2+ on the left side.       Dorsalis pedis pulses are 2+ on the right side, and 2+ on the left side.  Pulmonary/Chest: Effort normal and breath sounds normal. No respiratory distress. She has no wheezes. She has no rhonchi. She has no rales.  Abdominal: Soft. Normal appearance and bowel sounds are normal. She exhibits no distension. There is no hepatosplenomegaly. There is tenderness in the right upper quadrant. There is guarding. There is no rigidity.    Previous well healed scar from splenectomy  Musculoskeletal: Normal range of motion. She exhibits no edema, tenderness or deformity.  Lymphadenopathy:    She has no cervical adenopathy.  Neurological: She is alert and oriented to person, place, and time.  Skin: Skin is warm and dry. No rash noted. She is not diaphoretic.  Psychiatric: She has a normal mood and affect.  Nursing note and vitals reviewed.   Results for orders placed or performed during the hospital encounter of 10/01/18 (from the past 48 hour(s))  Lipase, blood     Status: Abnormal   Collection Time: 10/01/18  9:07 PM  Result Value Ref Range   Lipase 146 (H) 11 - 51 U/L    Comment: Performed at [facility], [place] [address] [place], [place] [address]  Glucose, capillary     Status: Abnormal   Collection Time: 10/01/18  9:49 PM  Result Value Ref Range   Glucose-Capillary 154 (H) 70 - 99 mg/dL  Glucose, capillary     Status: Abnormal   Collection Time: 10/02/18 12:04 AM  Result Value Ref Range    Glucose-Capillary 148 (H) 70 - 99 mg/dL  Glucose, capillary     Status: Abnormal   Collection Time: 10/02/18  4:48 AM  Result Value Ref Range  Glucose-Capillary 117 (H) 70 - 99 mg/dL  Comprehensive metabolic panel     Status: Abnormal   Collection Time: 10/02/18  6:19 AM  Result Value Ref Range   Sodium 138 135 - 145 mmol/L   Potassium 4.3 3.5 - 5.1 mmol/L   Chloride 108 98 - 111 mmol/L   CO2 23 22 - 32 mmol/L   Glucose, Bld 144 (H) 70 - 99 mg/dL   BUN 13 8 - 23 mg/dL   Creatinine, Ser 0.80 0.44 - 1.00 mg/dL   Calcium 7.8 (L) 8.9 - 10.3 mg/dL   Total Protein 6.1 (L) 6.5 - 8.1 g/dL   Albumin 2.6 (L) 3.5 - 5.0 g/dL   AST 265 (H) 15 - 41 U/L   ALT 184 (H) 0 - 44 U/L   Alkaline Phosphatase 136 (H) 38 - 126 U/L   Total Bilirubin 4.4 (H) 0.3 - 1.2 mg/dL   GFR calc non Af Amer >60 >60 mL/min   GFR calc Af Amer >60 >60 mL/min    Comment: (NOTE) The eGFR has been calculated using the CKD EPI equation. This calculation has not been validated in all clinical situations. eGFR's persistently <60 mL/min signify possible Chronic Kidney Disease.    Anion gap 7 5 - 15    Comment: Performed at [facility], [place] [address] [place], [place] [address]  CBC WITH DIFFERENTIAL     Status: Abnormal   Collection Time: 10/02/18  6:19 AM  Result Value Ref Range   WBC 12.8 (H) 4.0 - 10.5 K/uL   RBC 3.64 (L) 3.87 - 5.11 MIL/uL   Hemoglobin 9.9 (L) 12.0 - 15.0 g/dL   HCT 33.2 (L) 36.0 - 46.0 %   MCV 91.2 80.0 - 100.0 fL   MCH 27.2 26.0 - 34.0 pg   MCHC 29.8 (L) 30.0 - 36.0 g/dL   RDW 16.2 (H) 11.5 - 15.5 %   Platelets 451 (H) 150 - 400 K/uL   nRBC 0.0 0.0 - 0.2 %   Neutrophils Relative % 61 %   Neutro Abs 7.8 (H) 1.7 - 7.7 K/uL   Lymphocytes Relative 25 %   Lymphs Abs 3.2 0.7 - 4.0 K/uL   Monocytes Relative 11 %   Monocytes Absolute 1.3 (H) 0.1 - 1.0 K/uL   Eosinophils Relative 2 %   Eosinophils Absolute 0.3 0.0 - 0.5 K/uL   Basophils Relative 0 %   Basophils Absolute 0.0  0.0 - 0.1 K/uL   Immature Granulocytes 1 %   Abs Immature Granulocytes 0.17 (H) 0.00 - 0.07 K/uL    Comment: Performed at [facility], [place] [address] [place], [place] [address]  Lipase, blood     Status: Abnormal   Collection Time: 10/02/18  6:19 AM  Result Value Ref Range   Lipase 212 (H) 11 - 51 U/L    Comment: Performed at [facility], [place] [address], [place], [place] [address]  Glucose, capillary     Status: Abnormal   Collection Time: 10/02/18  7:50 AM  Result Value Ref Range   Glucose-Capillary 137 (H) 70 - 99 mg/dL   US Abdomen Limited Ruq  Result Date: 10/02/2018 CLINICAL DATA:  Acute biliary pancreatitis. EXAM: ULTRASOUND ABDOMEN LIMITED RIGHT UPPER QUADRANT COMPARISON:  CT 10/01/2018 FINDINGS: Gallbladder: Gallbladder filled with gallstones and debris/sludge. Gallbladder wall is borderline thickened at 3.5 mm. Negative sonographic Murphy's. Common bile duct: Diameter: Normal caliber, 1-2 mm. Liver: Mild diffusely increased echotexture suggesting early fatty infiltration. No  focal hepatic abnormality. Portal vein is patent on color Doppler imaging with normal direction of blood flow towards the liver. IMPRESSION: Gallbladder filled with gallstones and sludge. Borderline gallbladder wall thickness. No sonographic Murphy's sign. Early fatty infiltration of the liver. Electronically Signed   By: Rolm Baptise M.D.   On: 10/02/2018 09:36      Assessment/Plan Principal Problem:   Acute biliary pancreatitis Active Problems:   Diabetes type 2, uncontrolled (HCC)   COPD (chronic obstructive pulmonary disease) (HCC)   Acute cholangitis  Gallstone pancreatitis - OR for lap chole when lipase and pain has improved - agree with GI, MRCP to evaluate CBD  FEN: CLD okay after MRCP VTE: SCD's, lovenox ID: [email or web address] 11/21>> Foley: none Follow up: TBD  Plan: OR when lipase and pain improved. We will follow. --MRCP didn't show any common bile duct  stones.  Lipase appearing to clear.  Can have lap chole when normal.  Patient has had splenectomy.     Kalman Drape, [facility] Surgery 10/02/2018, 11:26 AM Pager: [phone] Consults: [phone] Mon-Fri 7:00 am-4:30 pm Sat-Sun 7:00 am-11:30 am

## 2018-10-02 NOTE — Progress Notes (Signed)
PROGRESS NOTE    Pam Gamble  KZL:[id number] DOB: 1946-06-27 DOA: 10/01/2018 PCP: Cassandria Anger, MD   Brief Narrative: Patient is a 72 year old female with past medical history of COPD, type diabetes mellitus, hypertension, hyperlipidemia who presented to the emergency department at [facility] with complaints of right upper abdominal pain, nausea, vomiting and chills.  She was found to have elevated liver enzymes, elevated bilirubin, elevated white cell counts and lipase of 9599  on presentation.  Acute biliary pancreatitis was suspected and she was sent for admission here. GI and general surgery consulted.  Undergoing MRCP today.  Assessment & Plan:   Principal Problem:   Acute biliary pancreatitis Active Problems:   Diabetes type 2, uncontrolled (HCC)   COPD (chronic obstructive pulmonary disease) (HCC)   Acute cholangitis   Acute biliary pancreatitis: Currently abdominal pain is much better.  Nausea and vomiting have improved.  Improved liver function test but bilirubin trended up. Ultrasound of the gallbladder showed plenty of stones, borderline gallbladder wall thickening, normal CBD. Gastroenterology consulted.  Plan for MRCP.  General surgery was consulted for possible cholecystectomy during this hospitalization. Continue pain management, IV fluids.  Continue n.p.o. Status.  COPD: Currently stable.  On inhalers at home.  Continue as needed bronchodilators.  DM-2: On insulin at home.  Continue sliding scale insulin here along with Lantus. We will continue to monitor her CBG.  History of thrombocytosis: Currently stable.  Chronic.  Status post splenectomy.    DVT prophylaxis: Lovenox Code Status: Full Family Communication: None present at the bedside Disposition Plan: Home after full work-up   Consultants: General surgery, GI  Procedures: None  Antimicrobials: Zosyn  Subjective: Patient seen and examined the bedside this morning.  Remains comfortable  during my evaluation.  Abdominal pain is much better today.  Nausea and vomiting have improved .She was anxious about the possibility of surgery  Objective: Vitals:   10/01/18 1957 10/01/18 2100 10/02/18 0452 10/02/18 0725  BP: 107/83  (!) 107/54   Pulse: 98  79   Resp: 18  18   Temp: 100.1 F (37.8 C)  98.7 F (37.1 C)   TempSrc: Oral  Oral   SpO2: 96%  96% 96%  Weight:  73.8 kg 69.9 kg   Height:  5\' 2"  (1.575 m)      Intake/Output Summary (Last 24 hours) at 10/02/2018 1111 Last data filed at 10/02/2018 1018 Gross per 24 hour  Intake 1127.28 ml  Output -  Net 1127.28 ml   Filed Weights   10/01/18 2100 10/02/18 0452  Weight: 73.8 kg 69.9 kg    Examination:  General exam: Appears calm and comfortable ,Not in distress,average built HEENT:PERRL,Oral mucosa moist, Ear/Nose normal on gross exam Respiratory system: Bilateral equal air entry, normal vesicular breath sounds, no wheezes or crackles  Cardiovascular system: S1 & S2 heard, RRR. No JVD, murmurs, rubs, gallops or clicks. No pedal edema. Gastrointestinal system: Abdomen is nondistended, soft and right upper quadrant tenderness is present. No organomegaly or masses felt. Normal bowel sounds heard. Central nervous system: Alert and oriented. No focal neurological deficits. Extremities: No edema, no clubbing ,no cyanosis, distal peripheral pulses palpable. Skin: No rashes, lesions or ulcers,no icterus ,no pallor MSK: Normal muscle bulk,tone ,power Psychiatry: Judgement and insight appear normal. Mood & affect appropriate.     Data Reviewed: I have personally reviewed following labs and imaging studies  CBC: Recent Labs  Lab 10/02/18 0619  WBC 12.8*  NEUTROABS 7.8*  HGB 9.9*  HCT  33.2*  MCV 91.2  PLT 825*   Basic Metabolic Panel: Recent Labs  Lab 10/02/18 0619  NA 138  K 4.3  CL 108  CO2 23  GLUCOSE 144*  BUN 13  CREATININE 0.80  CALCIUM 7.8*   GFR: Estimated Creatinine Clearance: 58.2 mL/min (by  C-G formula based on SCr of 0.8 mg/dL). Liver Function Tests: Recent Labs  Lab 10/02/18 0619  AST 265*  ALT 184*  ALKPHOS 136*  BILITOT 4.4*  PROT 6.1*  ALBUMIN 2.6*   Recent Labs  Lab 10/01/18 2107 10/02/18 0619  LIPASE 146* 212*   No results for input(s): AMMONIA in the last 168 hours. Coagulation Profile: No results for input(s): INR, PROTIME in the last 168 hours. Cardiac Enzymes: No results for input(s): CKTOTAL, CKMB, CKMBINDEX, TROPONINI in the last 168 hours. BNP (last 3 results) No results for input(s): PROBNP in the last 8760 hours. HbA1C: No results for input(s): HGBA1C in the last 72 hours. CBG: Recent Labs  Lab 10/01/18 2149 10/02/18 0004 10/02/18 0448 10/02/18 0750  GLUCAP 154* 148* 117* 137*   Lipid Profile: No results for input(s): CHOL, HDL, LDLCALC, TRIG, CHOLHDL, LDLDIRECT in the last 72 hours. Thyroid Function Tests: No results for input(s): TSH, T4TOTAL, FREET4, T3FREE, THYROIDAB in the last 72 hours. Anemia Panel: No results for input(s): VITAMINB12, FOLATE, FERRITIN, TIBC, IRON, RETICCTPCT in the last 72 hours. Sepsis Labs: No results for input(s): PROCALCITON, LATICACIDVEN in the last 168 hours.  No results found for this or any previous visit (from the past 240 hour(s)).       Radiology Studies: US Abdomen Limited Ruq  Result Date: 10/02/2018 CLINICAL DATA:  Acute biliary pancreatitis. EXAM: ULTRASOUND ABDOMEN LIMITED RIGHT UPPER QUADRANT COMPARISON:  CT 10/01/2018 FINDINGS: Gallbladder: Gallbladder filled with gallstones and debris/sludge. Gallbladder wall is borderline thickened at 3.5 mm. Negative sonographic Murphy's. Common bile duct: Diameter: Normal caliber, 1-2 mm. Liver: Mild diffusely increased echotexture suggesting early fatty infiltration. No focal hepatic abnormality. Portal vein is patent on color Doppler imaging with normal direction of blood flow towards the liver. IMPRESSION: Gallbladder filled with gallstones and  sludge. Borderline gallbladder wall thickness. No sonographic Murphy's sign. Early fatty infiltration of the liver. Electronically Signed   By: Rolm Baptise M.D.   On: 10/02/2018 09:36        Scheduled Meds: . enoxaparin (LOVENOX) injection  40 mg Subcutaneous Q24H  . fluticasone furoate-vilanterol  1 puff Inhalation Daily  . insulin aspart  0-9 Units Subcutaneous Q4H  . insulin glargine  10 Units Subcutaneous Daily  . umeclidinium bromide  1 puff Inhalation Daily   Continuous Infusions: . famotidine (PEPCID) IV 20 mg (10/02/18 0935)  . piperacillin-tazobactam (ZOSYN)  IV 12.5 mL/hr at 10/02/18 0826     LOS: 1 day    Time spent: 35 mins.More than 50% of that time was spent in counseling and/or coordination of care.      Shelly Coss, MD [facility] Pager [phone]  If 7PM-7AM, please contact night-coverage [email or web address] Password [email or web address] 10/02/2018, 11:11 AM

## 2018-10-03 ENCOUNTER — Inpatient Hospital Stay (HOSPITAL_COMMUNITY): Payer: Medicare HMO | Admitting: Anesthesiology

## 2018-10-03 ENCOUNTER — Encounter (HOSPITAL_COMMUNITY)
Admission: AD | Disposition: A | Discharge status: Home / Self Care | Payer: Self-pay | Source: Other Acute Inpatient Hospital | Attending: Internal Medicine

## 2018-10-03 ENCOUNTER — Inpatient Hospital Stay (HOSPITAL_COMMUNITY): Payer: Medicare HMO

## 2018-10-03 ENCOUNTER — Encounter (HOSPITAL_COMMUNITY): Payer: Self-pay | Admitting: *Deleted

## 2018-10-03 HISTORY — PX: CHOLECYSTECTOMY: SHX55

## 2018-10-03 LAB — GLUCOSE, CAPILLARY
GLUCOSE-CAPILLARY: 130 mg/dL — AB (ref 70–99)
GLUCOSE-CAPILLARY: 146 mg/dL — AB (ref 70–99)
GLUCOSE-CAPILLARY: 152 mg/dL — AB (ref 70–99)
GLUCOSE-CAPILLARY: 231 mg/dL — AB (ref 70–99)
Glucose-Capillary: 160 mg/dL — ABNORMAL HIGH (ref 70–99)
Glucose-Capillary: 168 mg/dL — ABNORMAL HIGH (ref 70–99)
Glucose-Capillary: 184 mg/dL — ABNORMAL HIGH (ref 70–99)

## 2018-10-03 LAB — COMPREHENSIVE METABOLIC PANEL
ALBUMIN: 2.7 g/dL — AB (ref 3.5–5.0)
ALK PHOS: 154 U/L — AB (ref 38–126)
ALT: 140 U/L — ABNORMAL HIGH (ref 0–44)
ANION GAP: 11 (ref 5–15)
AST: 134 U/L — AB (ref 15–41)
BILIRUBIN TOTAL: 1.9 mg/dL — AB (ref 0.3–1.2)
BUN: 11 mg/dL (ref 8–23)
CO2: 20 mmol/L — ABNORMAL LOW (ref 22–32)
CREATININE: 0.98 mg/dL (ref 0.44–1.00)
Calcium: 8.6 mg/dL — ABNORMAL LOW (ref 8.9–10.3)
Chloride: 109 mmol/L (ref 98–111)
GFR calc non Af Amer: 56 mL/min — ABNORMAL LOW (ref >60)
Glucose, Bld: 182 mg/dL — ABNORMAL HIGH (ref 70–99)
Potassium: 4.4 mmol/L (ref 3.5–5.1)
Sodium: 140 mmol/L (ref 135–145)
TOTAL PROTEIN: 6.6 g/dL (ref 6.5–8.1)

## 2018-10-03 LAB — CBC WITH DIFFERENTIAL/PLATELET
Abs Immature Granulocytes: 0.14 10*3/uL — ABNORMAL HIGH (ref 0.00–0.07)
BASOS PCT: 1 %
Basophils Absolute: 0.1 10*3/uL (ref 0.0–0.1)
Eosinophils Absolute: 0.4 10*3/uL (ref 0.0–0.5)
Eosinophils Relative: 4 %
HCT: 33 % — ABNORMAL LOW (ref 36.0–46.0)
Hemoglobin: 9.8 g/dL — ABNORMAL LOW (ref 12.0–15.0)
Immature Granulocytes: 2 %
Lymphocytes Relative: 32 %
Lymphs Abs: 2.7 10*3/uL (ref 0.7–4.0)
MCH: 27.5 pg (ref 26.0–34.0)
MCHC: 29.7 g/dL — ABNORMAL LOW (ref 30.0–36.0)
MCV: 92.4 fL (ref 80.0–100.0)
MONO ABS: 0.8 10*3/uL (ref 0.1–1.0)
MONOS PCT: 10 %
Neutro Abs: 4.4 10*3/uL (ref 1.7–7.7)
Neutrophils Relative %: 51 %
Platelets: 415 10*3/uL — ABNORMAL HIGH (ref 150–400)
RBC: 3.57 MIL/uL — ABNORMAL LOW (ref 3.87–5.11)
RDW: 16.7 % — ABNORMAL HIGH (ref 11.5–15.5)
WBC: 8.4 10*3/uL (ref 4.0–10.5)
nRBC: 0.2 % (ref 0.0–0.2)

## 2018-10-03 LAB — MRSA PCR SCREENING: MRSA by PCR: NEGATIVE

## 2018-10-03 LAB — LIPASE, BLOOD: Lipase: 113 U/L — ABNORMAL HIGH (ref 11–51)

## 2018-10-03 SURGERY — LAPAROSCOPIC CHOLECYSTECTOMY WITH INTRAOPERATIVE CHOLANGIOGRAM
Anesthesia: General | Site: Abdomen

## 2018-10-03 MED ORDER — FENTANYL CITRATE (PF) 100 MCG/2ML IJ SOLN
INTRAMUSCULAR | Status: DC | PRN
  Administered 2018-10-03: 100 ug via INTRAVENOUS
  Administered 2018-10-03 (×2): 50 ug via INTRAVENOUS

## 2018-10-03 MED ORDER — LIDOCAINE HCL (CARDIAC) PF 100 MG/5ML IV SOSY
PREFILLED_SYRINGE | INTRAVENOUS | Status: DC | PRN
  Administered 2018-10-03: 60 mg via INTRAVENOUS

## 2018-10-03 MED ORDER — IOPAMIDOL (ISOVUE-300) INJECTION 61%
INTRAVENOUS | Status: DC | PRN
  Administered 2018-10-03: 10 mL

## 2018-10-03 MED ORDER — PHENYLEPHRINE 40 MCG/ML (10ML) SYRINGE FOR IV PUSH (FOR BLOOD PRESSURE SUPPORT)
PREFILLED_SYRINGE | INTRAVENOUS | Status: AC
  Filled 2018-10-03: qty 10

## 2018-10-03 MED ORDER — 0.9 % SODIUM CHLORIDE (POUR BTL) OPTIME
TOPICAL | Status: DC | PRN
  Administered 2018-10-03: 1000 mL

## 2018-10-03 MED ORDER — LACTATED RINGERS IR SOLN
Status: DC | PRN
  Administered 2018-10-03: 1000 mL

## 2018-10-03 MED ORDER — PROPOFOL 10 MG/ML IV BOLUS
INTRAVENOUS | Status: DC | PRN
  Administered 2018-10-03: 110 mg via INTRAVENOUS

## 2018-10-03 MED ORDER — IOPAMIDOL (ISOVUE-300) INJECTION 61%
INTRAVENOUS | Status: AC
  Filled 2018-10-03: qty 50

## 2018-10-03 MED ORDER — FENTANYL CITRATE (PF) 250 MCG/5ML IJ SOLN
INTRAMUSCULAR | Status: AC
  Filled 2018-10-03: qty 5

## 2018-10-03 MED ORDER — SUGAMMADEX SODIUM 200 MG/2ML IV SOLN
INTRAVENOUS | Status: AC
  Filled 2018-10-03: qty 2

## 2018-10-03 MED ORDER — ONDANSETRON HCL 4 MG/2ML IJ SOLN
INTRAMUSCULAR | Status: AC
  Filled 2018-10-03: qty 2

## 2018-10-03 MED ORDER — ROCURONIUM BROMIDE 100 MG/10ML IV SOLN
INTRAVENOUS | Status: DC | PRN
  Administered 2018-10-03: 50 mg via INTRAVENOUS

## 2018-10-03 MED ORDER — DEXAMETHASONE SODIUM PHOSPHATE 10 MG/ML IJ SOLN
INTRAMUSCULAR | Status: DC | PRN
  Administered 2018-10-03: 4 mg via INTRAVENOUS

## 2018-10-03 MED ORDER — ROCURONIUM BROMIDE 100 MG/10ML IV SOLN
INTRAVENOUS | Status: AC
  Filled 2018-10-03: qty 1

## 2018-10-03 MED ORDER — HYDROMORPHONE HCL 1 MG/ML IJ SOLN
INTRAMUSCULAR | Status: AC
  Filled 2018-10-03: qty 1

## 2018-10-03 MED ORDER — BUPIVACAINE-EPINEPHRINE (PF) 0.25% -1:200000 IJ SOLN
INTRAMUSCULAR | Status: AC
  Filled 2018-10-03: qty 30

## 2018-10-03 MED ORDER — HYDROCODONE-ACETAMINOPHEN 5-325 MG PO TABS
1.0000 | ORAL_TABLET | ORAL | Status: DC | PRN
  Administered 2018-10-03: 2 via ORAL
  Administered 2018-10-03: 1 via ORAL
  Administered 2018-10-04 (×3): 2 via ORAL
  Filled 2018-10-03 (×3): qty 2
  Filled 2018-10-03: qty 1
  Filled 2018-10-03: qty 2

## 2018-10-03 MED ORDER — ONDANSETRON HCL 4 MG/2ML IJ SOLN
INTRAMUSCULAR | Status: DC | PRN
  Administered 2018-10-03: 4 mg via INTRAVENOUS

## 2018-10-03 MED ORDER — HYDROMORPHONE HCL 1 MG/ML IJ SOLN
0.2500 mg | INTRAMUSCULAR | Status: DC | PRN
  Administered 2018-10-03: 0.25 mg via INTRAVENOUS

## 2018-10-03 MED ORDER — SUGAMMADEX SODIUM 200 MG/2ML IV SOLN
INTRAVENOUS | Status: DC | PRN
  Administered 2018-10-03: 150 mg via INTRAVENOUS

## 2018-10-03 MED ORDER — PIPERACILLIN-TAZOBACTAM 3.375 G IVPB
3.3750 g | Freq: Three times a day (TID) | INTRAVENOUS | Status: AC
  Administered 2018-10-03 – 2018-10-04 (×3): 3.375 g via INTRAVENOUS
  Filled 2018-10-03 (×3): qty 50

## 2018-10-03 MED ORDER — PHENYLEPHRINE HCL 10 MG/ML IJ SOLN
INTRAMUSCULAR | Status: DC | PRN
  Administered 2018-10-03: 80 ug via INTRAVENOUS

## 2018-10-03 MED ORDER — POTASSIUM CHLORIDE IN NACL 20-0.45 MEQ/L-% IV SOLN
INTRAVENOUS | Status: DC
  Administered 2018-10-03 – 2018-10-04 (×2): via INTRAVENOUS
  Filled 2018-10-03 (×3): qty 1000

## 2018-10-03 MED ORDER — BUPIVACAINE-EPINEPHRINE 0.25% -1:200000 IJ SOLN
INTRAMUSCULAR | Status: DC | PRN
  Administered 2018-10-03: 30 mL

## 2018-10-03 MED ORDER — GLYCOPYRROLATE 0.2 MG/ML IJ SOLN
INTRAMUSCULAR | Status: DC | PRN
  Administered 2018-10-03: 0.2 mg via INTRAVENOUS

## 2018-10-03 MED ORDER — LACTATED RINGERS IV SOLN
INTRAVENOUS | Status: DC | PRN
  Administered 2018-10-03: 10:00:00 via INTRAVENOUS

## 2018-10-03 MED ORDER — PROPOFOL 10 MG/ML IV BOLUS
INTRAVENOUS | Status: AC
  Filled 2018-10-03: qty 20

## 2018-10-03 SURGICAL SUPPLY — 41 items
ADH SKN CLS APL DERMABOND .7 (GAUZE/BANDAGES/DRESSINGS) ×1
APPLIER CLIP 5 13 M/L LIGAMAX5 (MISCELLANEOUS)
APPLIER CLIP ROT 10 11.4 M/L (STAPLE) ×2
APR CLP MED LRG 11.4X10 (STAPLE) ×1
APR CLP MED LRG 5 ANG JAW (MISCELLANEOUS)
BAG SPEC RTRVL 10 TROC 200 (ENDOMECHANICALS) ×1
CABLE HIGH FREQUENCY MONO STRZ (ELECTRODE) ×2 IMPLANT
CHLORAPREP W/TINT 26ML (MISCELLANEOUS) ×2 IMPLANT
CHOLANGIOGRAM CATH TAUT (CATHETERS) ×2 IMPLANT
CLIP APPLIE 5 13 M/L LIGAMAX5 (MISCELLANEOUS) IMPLANT
CLIP APPLIE ROT 10 11.4 M/L (STAPLE) IMPLANT
COVER MAYO STAND STRL (DRAPES) ×2 IMPLANT
COVER SURGICAL LIGHT HANDLE (MISCELLANEOUS) ×2 IMPLANT
COVER WAND RF STERILE (DRAPES) ×1 IMPLANT
DECANTER SPIKE VIAL GLASS SM (MISCELLANEOUS) ×2 IMPLANT
DERMABOND ADVANCED (GAUZE/BANDAGES/DRESSINGS) ×1
DERMABOND ADVANCED .7 DNX12 (GAUZE/BANDAGES/DRESSINGS) ×1 IMPLANT
DRAPE C-ARM 42X120 X-RAY (DRAPES) ×2 IMPLANT
ELECT REM PT RETURN 15FT ADLT (MISCELLANEOUS) ×2 IMPLANT
GLOVE SURG SIGNA 7.5 PF LTX (GLOVE) ×2 IMPLANT
GOWN STRL REUS W/TWL XL LVL3 (GOWN DISPOSABLE) ×6 IMPLANT
HEMOSTAT SURGICEL 4X8 (HEMOSTASIS) IMPLANT
IV CATH 14GX2 1/4 (CATHETERS) ×2 IMPLANT
IV SET EXTENSION CATH 6 NF (IV SETS) ×2 IMPLANT
KIT BASIN OR (CUSTOM PROCEDURE TRAY) ×2 IMPLANT
POUCH RETRIEVAL ECOSAC 10 (ENDOMECHANICALS) ×1 IMPLANT
POUCH RETRIEVAL ECOSAC 10MM (ENDOMECHANICALS) ×1
SCISSORS LAP 5X35 DISP (ENDOMECHANICALS) ×2 IMPLANT
SET IRRIG TUBING LAPAROSCOPIC (IRRIGATION / IRRIGATOR) ×2 IMPLANT
SLEEVE ADV FIXATION 5X100MM (TROCAR) ×2 IMPLANT
STOPCOCK 4 WAY LG BORE MALE ST (IV SETS) ×2 IMPLANT
STRIP CLOSURE SKIN 1/4X4 (GAUZE/BANDAGES/DRESSINGS) IMPLANT
SUT MNCRL AB 4-0 PS2 18 (SUTURE) ×2 IMPLANT
SYR 10ML ECCENTRIC (SYRINGE) ×2 IMPLANT
TOWEL OR 17X26 10 PK STRL BLUE (TOWEL DISPOSABLE) ×2 IMPLANT
TOWEL OR NON WOVEN STRL DISP B (DISPOSABLE) ×2 IMPLANT
TRAY LAPAROSCOPIC (CUSTOM PROCEDURE TRAY) ×2 IMPLANT
TROCAR ADV FIXATION 11X100MM (TROCAR) IMPLANT
TROCAR ADV FIXATION 5X100MM (TROCAR) ×2 IMPLANT
TROCAR XCEL BLUNT TIP 100MML (ENDOMECHANICALS) ×2 IMPLANT
TUBING INSUF HEATED (TUBING) ×2 IMPLANT

## 2018-10-03 NOTE — Op Note (Signed)
10/03/2018  11:22 AM  PATIENT:  Pam Gamble, 72 y.o., female, MRN: [id number]  PREOP DIAGNOSIS:  Gallstones, gall stone pancreatitis  POSTOP DIAGNOSIS:   Chronic cholecystitis, gall stones, gall stone pancreatitis  PROCEDURE:   Procedure(s): LAPAROSCOPIC CHOLECYSTECTOMY WITH INTRAOPERATIVE CHOLANGIOGRAM  SURGEON:   Alphonsa Overall, M.D.  ASSISTANT:   None  ANESTHESIA:   general  Anesthesiologist: Roderic Palau, MD CRNA: Glory Buff, CRNA  General  ASA: 2E  EBL:  minimal  ml  BLOOD ADMINISTERED: none  DRAINS: none   LOCAL MEDICATIONS USED:   30 cc of 1/4% marcaine  SPECIMEN:   Gall bladder  COUNTS CORRECT:  YES  INDICATIONS FOR PROCEDURE:  Pam Gamble is a 72 y.o. (DOB: [birth year]) white female whose primary care physician is Serita Grammes, MD and comes for cholecystectomy.  She was admitted for gall stone pancreatitis - her lipase and T Bili are better.   The indications and risks of the gall bladder surgery were explained to the patient.  The risks include, but are not limited to, infection, bleeding, common bile duct injury and open surgery.  SURGERY:  The patient was taken to OR room #1 at [facility].  The abdomen was prepped with chloroprep.  The patient was on Zosyn prior to the beginning of the operation.   A time out was held and the surgical checklist run.   An infraumbilical incision was made into the abdominal cavity.  A 12 mm Hasson trocar was inserted into the abdominal cavity through the infraumbilical incision and secured with a 0 Vicryl suture.  Three additional trocars were inserted: a 10 mm trocar in the sub-xiphoid location, a 5 mm trocar in the right mid subcostal area, and a 5 mm trocar in the right lateral subcostal area.   The abdomen was explored and the liver, stomach, and bowel that could be seen were unremarkable.  She had adhesions in the left upper quadrant.  I did not see an intra-abdominal mesh, but it could  have been hidden under the adhesions.  The adhesions did not impact the gall bladder surgery, so I left them alone.   The gall bladder was chronically inflamed with a thickened rind and full of stones.   I grasped the gall bladder and rotated it cephalad.  Disssection was carried down to the gall bladder/cystic duct junction and the cystic duct isolated. There was a lot of chronic inflammation around the gall bladder and cystic duct junction.  A clip was placed on the gall bladder side of the cystic duct.   An intra-operative cholangiogram was shot.   The intra-operative cholangiogram was shot using a cut off Taut catheter placed through a 14 gauge angiocath in the RUQ.  The Taut catheter was inserted in the cut cystic duct and secured with an endoclip.  A cholangiogram was shot with 11 cc of 1/2 strength Isoview.  Using fluoroscopy, the cholangiogram showed the flow of contrast into the common bile duct, up the hepatic radicals, and into the duodenum.  There was no mass or obstruction.  This was a normal intra-operative cholangiogram.   The Taut catheter was removed.  The cystic duct was tripley endoclipped and the cystic artery was identified and clipped.  The gall bladder was bluntly and sharpley dissected from the gall bladder bed.   After the gall bladder was removed from the liver, the gall bladder bed and Tri[place] were inspected.  There was no bleeding or bile leak.  The  gall bladder was placed in a Ecco Sac bag and delivered through the umbilicus.  The abdomen was irrigated with 30 cc saline.   The trocars were then removed.  I infiltrated 500 cc of 1/4% Marcaine into the incisions.  The umbilical port closed with a 0 Vicryl suture and the skin closed with 4-0 Monocryl.  The skin was painted with DermaBond.  The patient's sponge and needle count were correct.  The patient was transported to the RR in good condition.  Alphonsa Overall, MD, [facility] Surgery Pager:  [phone] Office phone:  [phone]

## 2018-10-03 NOTE — Progress Notes (Addendum)
[facility] Office:  [phone] General Surgery Progress Note   LOS: 2 days  POD -     Chief Complaint: Abdominal pain  Assessment and Plan: 1.  Gallstone pancreatitis  Last lipase -  212 - 10/02/2018  MRCP shows no obvious CBD stone  Last T Bili - 4.5 - 10/02/2018  On Zosyn  Proceed with lap chole today I discussed with the patient the indications and risks of gall bladder surgery.  The primary risks of gall bladder surgery include, but are not limited to, bleeding, infection, common bile duct injury, and open surgery.  There is also the risk that the patient may have continued symptoms after surgery.  We discussed the typical post-operative recovery course. I tried to answer the patient's questions.  2.  DM - managed by PCP - Dr. Serita Grammes in [place]  Poorly controlled 3.  COPD - sees Dr. Nolon Rod in [place]  On and off steroids - last steroids about 3 weeks ago 4.  Prior splenectomy, then repair of hernia with mesh - 1999 5.  Anemia  Hgb - 9.8 - 10/03/2018 6. DVT prophylaxis - Lovenox 7.  History of kidney stones  Sees Dr. Nila Nephew in [place]   Principal Problem:   Acute biliary pancreatitis Active Problems:   Diabetes type 2, uncontrolled ([place])   COPD (chronic obstructive pulmonary disease) (HCC)   Acute cholangitis  Subjective:  Feeling better.  Still has RUQ pain.  Unmarried and has no children.  She has a friend, Belinda Fisher, coming up from [place].  Objective:   Vitals:   10/02/18 2118 10/03/18 0346  BP: 117/67 138/60  Pulse: 83 71  Resp: (!) 21 20  Temp: 98.6 F (37 C) 97.6 F (36.4 C)  SpO2: 98% 98%     Intake/Output from previous day:  11/22 0701 - 11/23 0700 In: 625.3 [I.V.:515.4; IV Piggyback:109.9] Out: -   Intake/Output this shift:  No intake/output data recorded.   Physical Exam:   General: Mildly obese WF who is alert and oriented.    HEENT: Normal. Pupils equal. .   Lungs: Clear   Abdomen: Mild distention.   LUQ scar. RUQ mild tenderness.   Lab Results:    Recent Labs    10/02/18 0619 10/03/18 0509  WBC 12.8* 8.4  HGB 9.9* 9.8*  HCT 33.2* 33.0*  PLT 451* 415*    BMET   Recent Labs    10/02/18 0619 10/02/18 1132  NA 138 137  K 4.3 4.1  CL 108 107  CO2 23 22  GLUCOSE 144* 126*  BUN 13 14  CREATININE 0.80 0.90  CALCIUM 7.8* 8.0*    PT/INR  No results for input(s): LABPROT, INR in the last 72 hours.  ABG  No results for input(s): PHART, HCO3 in the last 72 hours.  Invalid input(s): PCO2, PO2   Studies/Results:  Mr 3d Recon At Scanner  Result Date: 10/02/2018 CLINICAL DATA:  Pancreatitis, acute, SIRS, elev WBC, fever EXAM: MRI ABDOMEN WITHOUT AND WITH CONTRAST (INCLUDING MRCP) TECHNIQUE: Multiplanar multisequence MR imaging of the abdomen was performed both before and after the administration of intravenous contrast. Heavily T2-weighted images of the biliary and pancreatic ducts were obtained, and three-dimensional MRCP images were rendered by post processing. CONTRAST:  7 mL Gadavist COMPARISON:  CT 1121 19 FINDINGS: Lower chest:  Lung bases are clear. Hepatobiliary: Multiple gallstones packed the lumen of the gallbladder. No gallbladder distension. The common bile duct is normal caliber measuring 5 mm the pancreatic head.  No filling defect within the common bile duct no external compression. A significant hepatic steatosis noted on the opposed phase imaging (series 9) at region of absent loss of signal intensity on opposed phase imaging in the central LEFT hepatic lobe (image 59/9 corresponds to the hyperenhancing lesion on comparison CT is most consistent with focal fatty sparing. The other smaller lesions described on comparison CT are more difficult to define but are favored to represent the same phenomena. Addition there several small round lesions in the liver which are hyperintense on T2 weighted imaging. Some of these demonstrate early arterial enhancement while others  demonstrate no enhancement which is consistent with a combination of benign cyst and benign flash filling hemangiomas. They are more hemangiomas and cysts and these persist on the more delayed imaging typical of hemangioma. Pancreas: Normal pancreatic parenchymal intensity. Mild inflammation along the pancreatic head and second portion duodenum. No pancreatic duct dilatation. No abnormal fluid collection. No ductal dilatation or inflammation. Spleen: Small spleen or splenule in the LEFT upper quadrant Adrenals/urinary tract: Adrenal glands and kidneys are normal. Stomach/Bowel: Stomach and limited of the small bowel is unremarkable Vascular/Lymphatic: Abdominal aortic normal caliber. No retroperitoneal periportal lymphadenopathy. Musculoskeletal: Hemangioma in the L2 vertebral body IMPRESSION: 1. Multiple gallstones pack the gallbladder limited. No evidence acute cholecystitis. No choledocholithiasis or biliary obstruction. 2. Hepatic steatosis. 3. Foci of focal fatty sparing in the LEFT hepatic lobe corresponds to abnormal CT findings. 4. Multiple small benign flash filling hemangioma within liver. 5. Mild inflammation of the junction the pancreas and duodenum. No fluid collections. Normal pancreatic duct. Electronically Signed   By: Suzy Bouchard M.D.   On: 10/02/2018 17:05   Mr Abdomen Mrcp W Wo Contast  Result Date: 10/02/2018 CLINICAL DATA:  Pancreatitis, acute, SIRS, elev WBC, fever EXAM: MRI ABDOMEN WITHOUT AND WITH CONTRAST (INCLUDING MRCP) TECHNIQUE: Multiplanar multisequence MR imaging of the abdomen was performed both before and after the administration of intravenous contrast. Heavily T2-weighted images of the biliary and pancreatic ducts were obtained, and three-dimensional MRCP images were rendered by post processing. CONTRAST:  7 mL Gadavist COMPARISON:  CT 1121 19 FINDINGS: Lower chest:  Lung bases are clear. Hepatobiliary: Multiple gallstones packed the lumen of the gallbladder. No  gallbladder distension. The common bile duct is normal caliber measuring 5 mm the pancreatic head. No filling defect within the common bile duct no external compression. A significant hepatic steatosis noted on the opposed phase imaging (series 9) at region of absent loss of signal intensity on opposed phase imaging in the central LEFT hepatic lobe (image 59/9 corresponds to the hyperenhancing lesion on comparison CT is most consistent with focal fatty sparing. The other smaller lesions described on comparison CT are more difficult to define but are favored to represent the same phenomena. Addition there several small round lesions in the liver which are hyperintense on T2 weighted imaging. Some of these demonstrate early arterial enhancement while others demonstrate no enhancement which is consistent with a combination of benign cyst and benign flash filling hemangiomas. They are more hemangiomas and cysts and these persist on the more delayed imaging typical of hemangioma. Pancreas: Normal pancreatic parenchymal intensity. Mild inflammation along the pancreatic head and second portion duodenum. No pancreatic duct dilatation. No abnormal fluid collection. No ductal dilatation or inflammation. Spleen: Small spleen or splenule in the LEFT upper quadrant Adrenals/urinary tract: Adrenal glands and kidneys are normal. Stomach/Bowel: Stomach and limited of the small bowel is unremarkable Vascular/Lymphatic: Abdominal aortic normal caliber. No retroperitoneal  periportal lymphadenopathy. Musculoskeletal: Hemangioma in the L2 vertebral body IMPRESSION: 1. Multiple gallstones pack the gallbladder limited. No evidence acute cholecystitis. No choledocholithiasis or biliary obstruction. 2. Hepatic steatosis. 3. Foci of focal fatty sparing in the LEFT hepatic lobe corresponds to abnormal CT findings. 4. Multiple small benign flash filling hemangioma within liver. 5. Mild inflammation of the junction the pancreas and duodenum. No  fluid collections. Normal pancreatic duct. Electronically Signed   By: Suzy Bouchard M.D.   On: 10/02/2018 17:05   US Abdomen Limited Ruq  Result Date: 10/02/2018 CLINICAL DATA:  Acute biliary pancreatitis. EXAM: ULTRASOUND ABDOMEN LIMITED RIGHT UPPER QUADRANT COMPARISON:  CT 10/01/2018 FINDINGS: Gallbladder: Gallbladder filled with gallstones and debris/sludge. Gallbladder wall is borderline thickened at 3.5 mm. Negative sonographic Murphy's. Common bile duct: Diameter: Normal caliber, 1-2 mm. Liver: Mild diffusely increased echotexture suggesting early fatty infiltration. No focal hepatic abnormality. Portal vein is patent on color Doppler imaging with normal direction of blood flow towards the liver. IMPRESSION: Gallbladder filled with gallstones and sludge. Borderline gallbladder wall thickness. No sonographic Murphy's sign. Early fatty infiltration of the liver. Electronically Signed   By: Rolm Baptise M.D.   On: 10/02/2018 09:36     Anti-infectives:   Anti-infectives (From admission, onward)   Start     Dose/Rate Route Frequency Ordered Stop   10/01/18 2200  piperacillin-tazobactam (ZOSYN) IVPB 3.375 g     3.375 g 12.5 mL/hr over 240 Minutes Intravenous Every 8 hours 10/01/18 2127        Alphonsa Overall, MD, FACS Pager: [facility] Office: [phone] 10/03/2018

## 2018-10-03 NOTE — Progress Notes (Signed)
PROGRESS NOTE    Pam Gamble  EQA:[id number] DOB: 07/07/1946 DOA: 10/01/2018 PCP: Serita Grammes, MD   Brief Narrative: Patient is a 72 year old female with past medical history of COPD, type diabetes mellitus, hypertension, hyperlipidemia who presented to the emergency department at [facility] with complaints of right upper abdominal pain, nausea, vomiting and chills.  She was found to have elevated liver enzymes, elevated bilirubin, elevated white cell counts and lipase of 9599  on presentation.  Acute biliary pancreatitis was suspected and she was sent for admission here. GI and general surgery consulted.  She underwent MRCP yesterday.  Underwent laparoscopic cholecystectomy today.  Assessment & Plan:   Principal Problem:   Acute biliary pancreatitis Active Problems:   Diabetes type 2, uncontrolled (HCC)   COPD (chronic obstructive pulmonary disease) (HCC)   Acute cholangitis   Acute biliary pancreatitis: Currently abdominal pain is much better.  Nausea and vomiting have improved.  Improved liver function test . Ultrasound of the gallbladder showed plenty of stones, borderline gallbladder wall thickening, normal CBD. Gastroenterology was consulted,underwent MRCP but just showed gallbladder packed with the stones.  General surgery following ans she is undergoing laparoscopic cholecystectomy today. Continue pain management, IV fluids.  COPD: Currently stable.  On inhalers at home.  Continue as needed bronchodilators.  DM-2: On insulin at home.  Continue sliding scale insulin here along with Lantus. We will continue to monitor her CBG.  History of thrombocytosis: Currently stable.  Chronic.  Status post splenectomy.    DVT prophylaxis: Lovenox Code Status: Full Family Communication: None present at the bedside Disposition Plan: Home after surgical clearance   Consultants: General surgery, GI  Procedures: None  Antimicrobials: Zosyn  Subjective: Patient seen  and examined the pressure this morning.  Remains comfortable.  No abdominal pain, nausea or vomiting.  Waiting for cholecystectomy today.  Objective: Vitals:   10/03/18 1215 10/03/18 1217 10/03/18 1228 10/03/18 1230  BP: (!) 170/72  (!) 165/74 (!) 170/94  Pulse: 64  76 80  Resp: 15  20 (!) 25  Temp:    97.9 F (36.6 C)  TempSrc:      SpO2: 100% 100%  100%  Weight:      Height:        Intake/Output Summary (Last 24 hours) at 10/03/2018 1325 Last data filed at 10/03/2018 1217 Gross per 24 hour  Intake 1163.25 ml  Output 25 ml  Net 1138.25 ml   Filed Weights   10/01/18 2100 10/02/18 0452  Weight: 73.8 kg 69.9 kg    Examination:  General exam: Appears calm and comfortable ,Not in distress,average built HEENT:PERRL,Oral mucosa moist, Ear/Nose normal on gross exam Respiratory system: Bilateral equal air entry, normal vesicular breath sounds, no wheezes or crackles  Cardiovascular system: S1 & S2 heard, RRR. No JVD, murmurs, rubs, gallops or clicks. Gastrointestinal system: Abdomen is nondistended, soft.No organomegaly or masses felt. Normal bowel sounds heard.RUQ tenderness Central nervous system: Alert and oriented. No focal neurological deficits. Extremities: No edema, no clubbing ,no cyanosis, distal peripheral pulses palpable. Skin: No rashes, lesions or ulcers,no icterus ,no pallor MSK: Normal muscle bulk,tone ,power Psychiatry: Judgement and insight appear normal. Mood & affect appropriate.       Data Reviewed: I have personally reviewed following labs and imaging studies  CBC: Recent Labs  Lab 10/02/18 0619 10/03/18 0509  WBC 12.8* 8.4  NEUTROABS 7.8* 4.4  HGB 9.9* 9.8*  HCT 33.2* 33.0*  MCV 91.2 92.4  PLT 451* 979*   Basic Metabolic Panel:  Recent Labs  Lab 10/02/18 0619 10/02/18 1132 10/03/18 0823  NA 138 137 140  K 4.3 4.1 4.4  CL 108 107 109  CO2 23 22 20*  GLUCOSE 144* 126* 182*  BUN 13 14 11   CREATININE 0.80 0.90 0.98  CALCIUM 7.8* 8.0*  8.6*   GFR: Estimated Creatinine Clearance: 47.5 mL/min (by C-G formula based on SCr of 0.98 mg/dL). Liver Function Tests: Recent Labs  Lab 10/02/18 0619 10/02/18 1132 10/03/18 0823  AST 265* 219* 134*  ALT 184* 176* 140*  ALKPHOS 136* 150* 154*  BILITOT 4.4* 4.5* 1.9*  PROT 6.1* 6.5 6.6  ALBUMIN 2.6* 2.8* 2.7*   Recent Labs  Lab 10/01/18 2107 10/02/18 0619 10/03/18 0823  LIPASE 146* 212* 113*   No results for input(s): AMMONIA in the last 168 hours. Coagulation Profile: No results for input(s): INR, PROTIME in the last 168 hours. Cardiac Enzymes: No results for input(s): CKTOTAL, CKMB, CKMBINDEX, TROPONINI in the last 168 hours. BNP (last 3 results) No results for input(s): PROBNP in the last 8760 hours. HbA1C: No results for input(s): HGBA1C in the last 72 hours. CBG: Recent Labs  Lab 10/03/18 0059 10/03/18 0347 10/03/18 0754 10/03/18 0954 10/03/18 1143  GLUCAP 152* 130* 146* 160* 168*   Lipid Profile: No results for input(s): CHOL, HDL, LDLCALC, TRIG, CHOLHDL, LDLDIRECT in the last 72 hours. Thyroid Function Tests: No results for input(s): TSH, T4TOTAL, FREET4, T3FREE, THYROIDAB in the last 72 hours. Anemia Panel: No results for input(s): VITAMINB12, FOLATE, FERRITIN, TIBC, IRON, RETICCTPCT in the last 72 hours. Sepsis Labs: No results for input(s): PROCALCITON, LATICACIDVEN in the last 168 hours.  Recent Results (from the past 240 hour(s))  Culture, blood (routine x 2)     Status: None (Preliminary result)   Collection Time: 10/01/18  9:11 PM  Result Value Ref Range Status   Specimen Description   Final    BLOOD RIGHT HAND Performed at [facility] [address] [place], [place] [address]    Special Requests   Final    BOTTLES DRAWN AEROBIC AND ANAEROBIC Blood Culture adequate volume Performed at [facility] [address], [place], [place] [address]    Culture   Final    NO GROWTH < 12 HOURS Performed at  [facility] [address] [place], [place] [address]    Report Status PENDING  Incomplete  Culture, blood (routine x 2)     Status: None (Preliminary result)   Collection Time: 10/01/18  9:14 PM  Result Value Ref Range Status   Specimen Description   Final    BLOOD RIGHT ARM Performed at [facility] [address] [place], [place] [address]    Special Requests   Final    BOTTLES DRAWN AEROBIC AND ANAEROBIC Blood Culture adequate volume Performed at [facility] [address], [place], [place] [address]    Culture   Final    NO GROWTH < 12 HOURS Performed at [facility] [address] [place], [place] [address]    Report Status PENDING  Incomplete  MRSA PCR Screening     Status: None   Collection Time: 10/03/18  8:17 AM  Result Value Ref Range Status   MRSA by PCR NEGATIVE NEGATIVE Final    Comment:        The GeneXpert MRSA Assay (FDA approved for NASAL specimens only), is one component of a comprehensive MRSA colonization surveillance program. It  is not intended to diagnose MRSA infection nor to guide or monitor treatment for MRSA infections. Performed at [facility], [place] [address], [place], [place] [address]          Radiology Studies: Dg Cholangiogram Operative  Result Date: 10/03/2018 CLINICAL DATA:  Cholecystectomy for cholelithiasis, chronic cholecystitis and gallstone pancreatitis. EXAM: INTRAOPERATIVE CHOLANGIOGRAM TECHNIQUE: Cholangiographic images from the C-arm fluoroscopic device were submitted for interpretation post-operatively. Please see the procedural report for the amount of contrast and the fluoroscopy time utilized. COMPARISON:  MRCP on 10/02/2018 FINDINGS: Intraoperative imaging with a C-arm demonstrates normal opacification of the biliary tree without evidence obstruction or filling defect. Contrast enters the duodenum normally. No contrast extravasation identified.  IMPRESSION: Normal intraoperative cholangiogram. Electronically Signed   By: Aletta Edouard M.D.   On: 10/03/2018 12:40   Mr 3d Recon At Scanner  Result Date: 10/02/2018 CLINICAL DATA:  Pancreatitis, acute, SIRS, elev WBC, fever EXAM: MRI ABDOMEN WITHOUT AND WITH CONTRAST (INCLUDING MRCP) TECHNIQUE: Multiplanar multisequence MR imaging of the abdomen was performed both before and after the administration of intravenous contrast. Heavily T2-weighted images of the biliary and pancreatic ducts were obtained, and three-dimensional MRCP images were rendered by post processing. CONTRAST:  7 mL Gadavist COMPARISON:  CT 1121 19 FINDINGS: Lower chest:  Lung bases are clear. Hepatobiliary: Multiple gallstones packed the lumen of the gallbladder. No gallbladder distension. The common bile duct is normal caliber measuring 5 mm the pancreatic head. No filling defect within the common bile duct no external compression. A significant hepatic steatosis noted on the opposed phase imaging (series 9) at region of absent loss of signal intensity on opposed phase imaging in the central LEFT hepatic lobe (image 59/9 corresponds to the hyperenhancing lesion on comparison CT is most consistent with focal fatty sparing. The other smaller lesions described on comparison CT are more difficult to define but are favored to represent the same phenomena. Addition there several small round lesions in the liver which are hyperintense on T2 weighted imaging. Some of these demonstrate early arterial enhancement while others demonstrate no enhancement which is consistent with a combination of benign cyst and benign flash filling hemangiomas. They are more hemangiomas and cysts and these persist on the more delayed imaging typical of hemangioma. Pancreas: Normal pancreatic parenchymal intensity. Mild inflammation along the pancreatic head and second portion duodenum. No pancreatic duct dilatation. No abnormal fluid collection. No ductal  dilatation or inflammation. Spleen: Small spleen or splenule in the LEFT upper quadrant Adrenals/urinary tract: Adrenal glands and kidneys are normal. Stomach/Bowel: Stomach and limited of the small bowel is unremarkable Vascular/Lymphatic: Abdominal aortic normal caliber. No retroperitoneal periportal lymphadenopathy. Musculoskeletal: Hemangioma in the L2 vertebral body IMPRESSION: 1. Multiple gallstones pack the gallbladder limited. No evidence acute cholecystitis. No choledocholithiasis or biliary obstruction. 2. Hepatic steatosis. 3. Foci of focal fatty sparing in the LEFT hepatic lobe corresponds to abnormal CT findings. 4. Multiple small benign flash filling hemangioma within liver. 5. Mild inflammation of the junction the pancreas and duodenum. No fluid collections. Normal pancreatic duct. Electronically Signed   By: Suzy Bouchard M.D.   On: 10/02/2018 17:05   Mr Abdomen Mrcp W Wo Contast  Result Date: 10/02/2018 CLINICAL DATA:  Pancreatitis, acute, SIRS, elev WBC, fever EXAM: MRI ABDOMEN WITHOUT AND WITH CONTRAST (INCLUDING MRCP) TECHNIQUE: Multiplanar multisequence MR imaging of the abdomen was performed both before and after the administration of intravenous contrast. Heavily T2-weighted images of the biliary and pancreatic ducts were obtained, and three-dimensional  MRCP images were rendered by post processing. CONTRAST:  7 mL Gadavist COMPARISON:  CT 1121 19 FINDINGS: Lower chest:  Lung bases are clear. Hepatobiliary: Multiple gallstones packed the lumen of the gallbladder. No gallbladder distension. The common bile duct is normal caliber measuring 5 mm the pancreatic head. No filling defect within the common bile duct no external compression. A significant hepatic steatosis noted on the opposed phase imaging (series 9) at region of absent loss of signal intensity on opposed phase imaging in the central LEFT hepatic lobe (image 59/9 corresponds to the hyperenhancing lesion on comparison CT is most  consistent with focal fatty sparing. The other smaller lesions described on comparison CT are more difficult to define but are favored to represent the same phenomena. Addition there several small round lesions in the liver which are hyperintense on T2 weighted imaging. Some of these demonstrate early arterial enhancement while others demonstrate no enhancement which is consistent with a combination of benign cyst and benign flash filling hemangiomas. They are more hemangiomas and cysts and these persist on the more delayed imaging typical of hemangioma. Pancreas: Normal pancreatic parenchymal intensity. Mild inflammation along the pancreatic head and second portion duodenum. No pancreatic duct dilatation. No abnormal fluid collection. No ductal dilatation or inflammation. Spleen: Small spleen or splenule in the LEFT upper quadrant Adrenals/urinary tract: Adrenal glands and kidneys are normal. Stomach/Bowel: Stomach and limited of the small bowel is unremarkable Vascular/Lymphatic: Abdominal aortic normal caliber. No retroperitoneal periportal lymphadenopathy. Musculoskeletal: Hemangioma in the L2 vertebral body IMPRESSION: 1. Multiple gallstones pack the gallbladder limited. No evidence acute cholecystitis. No choledocholithiasis or biliary obstruction. 2. Hepatic steatosis. 3. Foci of focal fatty sparing in the LEFT hepatic lobe corresponds to abnormal CT findings. 4. Multiple small benign flash filling hemangioma within liver. 5. Mild inflammation of the junction the pancreas and duodenum. No fluid collections. Normal pancreatic duct. Electronically Signed   By: Suzy Bouchard M.D.   On: 10/02/2018 17:05   US Abdomen Limited Ruq  Result Date: 10/02/2018 CLINICAL DATA:  Acute biliary pancreatitis. EXAM: ULTRASOUND ABDOMEN LIMITED RIGHT UPPER QUADRANT COMPARISON:  CT 10/01/2018 FINDINGS: Gallbladder: Gallbladder filled with gallstones and debris/sludge. Gallbladder wall is borderline thickened at 3.5 mm.  Negative sonographic Murphy's. Common bile duct: Diameter: Normal caliber, 1-2 mm. Liver: Mild diffusely increased echotexture suggesting early fatty infiltration. No focal hepatic abnormality. Portal vein is patent on color Doppler imaging with normal direction of blood flow towards the liver. IMPRESSION: Gallbladder filled with gallstones and sludge. Borderline gallbladder wall thickness. No sonographic Murphy's sign. Early fatty infiltration of the liver. Electronically Signed   By: Rolm Baptise M.D.   On: 10/02/2018 09:36        Scheduled Meds: . enoxaparin (LOVENOX) injection  40 mg Subcutaneous Q24H  . fluticasone furoate-vilanterol  1 puff Inhalation Daily  . insulin aspart  0-9 Units Subcutaneous Q4H  . insulin glargine  10 Units Subcutaneous Daily  . umeclidinium bromide  1 puff Inhalation Daily   Continuous Infusions: . 0.45 % NaCl with KCl 20 mEq / L 75 mL/hr at 10/03/18 1301  . famotidine (PEPCID) IV Stopped (10/02/18 2118)  . piperacillin-tazobactam (ZOSYN)  IV       LOS: 2 days    Time spent: 35 mins.More than 50% of that time was spent in counseling and/or coordination of care.      Shelly Coss, MD [facility] Pager [phone]  If 7PM-7AM, please contact night-coverage [email or web address] Password [email or web address] 10/03/2018, 1:25 PM

## 2018-10-03 NOTE — Anesthesia Postprocedure Evaluation (Signed)
Anesthesia Post Note  Patient: Pam Gamble  Procedure(s) Performed: LAPAROSCOPIC CHOLECYSTECTOMY WITH INTRAOPERATIVE CHOLANGIOGRAM (N/A Abdomen)     Patient location during evaluation: PACU Anesthesia Type: General Level of consciousness: awake and alert Pain management: pain level controlled Vital Signs Assessment: post-procedure vital signs reviewed and stable Respiratory status: spontaneous breathing, nonlabored ventilation, respiratory function stable and patient connected to nasal cannula oxygen Cardiovascular status: blood pressure returned to baseline and stable Postop Assessment: no apparent nausea or vomiting Anesthetic complications: no    Last Vitals:  Vitals:   10/03/18 1130 10/03/18 1145  BP: (!) 171/79 (!) 175/74  Pulse: 83 74  Resp: 14 17  Temp: 36.8 C   SpO2: 100% 100%    Last Pain:  Vitals:   10/03/18 1145  TempSrc:   PainSc: 0-No pain                 Analiza Cowger,W. EDMOND

## 2018-10-03 NOTE — Plan of Care (Signed)
  Problem: Nutrition: Goal: Adequate nutrition will be maintained Outcome: Progressing   Problem: Elimination: Goal: Will not experience complications related to bowel motility Outcome: Progressing   Problem: Pain Managment: Goal: General experience of comfort will improve Outcome: Progressing   

## 2018-10-03 NOTE — Transfer of Care (Signed)
Immediate Anesthesia Transfer of Care Note  Patient: Pam Gamble  Procedure(s) Performed: LAPAROSCOPIC CHOLECYSTECTOMY WITH INTRAOPERATIVE CHOLANGIOGRAM (N/A Abdomen)  Patient Location: PACU  Anesthesia Type:General  Level of Consciousness: drowsy, patient cooperative and responds to stimulation  Airway & Oxygen Therapy: Patient Spontanous Breathing and Patient connected to face mask oxygen  Post-op Assessment: Report given to RN and Post -op Vital signs reviewed and stable  Post vital signs: Reviewed and stable  Last Vitals:  Vitals Value Taken Time  BP 171/79 10/03/2018 11:30 AM  Temp    Pulse 78 10/03/2018 11:31 AM  Resp 20 10/03/2018 11:31 AM  SpO2 100 % 10/03/2018 11:31 AM  Vitals shown include unvalidated device data.  Last Pain:  Vitals:   10/02/18 2000  TempSrc:   PainSc: 0-No pain      Patients Stated Pain Goal: 2 (46/56/81 2751)  Complications: No apparent anesthesia complications

## 2018-10-03 NOTE — Anesthesia Preprocedure Evaluation (Addendum)
Anesthesia Evaluation  Patient identified by MRN, date of birth, ID band Patient awake    Reviewed: Allergy & Precautions, H&P , NPO status , Patient's Chart, lab work & pertinent test results  Airway Mallampati: II  TM Distance: >3 FB Neck ROM: Full    Dental no notable dental hx. (+) Edentulous Upper, Edentulous Lower, Dental Advisory Given   Pulmonary COPD,  COPD inhaler, former smoker,    Pulmonary exam normal breath sounds clear to auscultation       Cardiovascular negative cardio ROS   Rhythm:Regular Rate:Normal     Neuro/Psych Depression negative neurological ROS     GI/Hepatic Neg liver ROS, GERD  Medicated,  Endo/Other  diabetes, Insulin Dependent, Oral Hypoglycemic Agents  Renal/GU negative Renal ROS  negative genitourinary   Musculoskeletal  (+) Arthritis , Osteoarthritis,    Abdominal   Peds  Hematology negative hematology ROS (+)   Anesthesia Other Findings   Reproductive/Obstetrics negative OB ROS                            Anesthesia Physical Anesthesia Plan  ASA: II  Anesthesia Plan: General   Post-op Pain Management:    Induction: Intravenous  PONV Risk Score and Plan: 4 or greater and Ondansetron, Dexamethasone and Midazolam  Airway Management Planned: Oral ETT  Additional Equipment:   Intra-op Plan:   Post-operative Plan: Extubation in OR  Informed Consent: I have reviewed the patients History and Physical, chart, labs and discussed the procedure including the risks, benefits and alternatives for the proposed anesthesia with the patient or authorized representative who has indicated his/her understanding and acceptance.   Dental advisory given  Plan Discussed with: CRNA  Anesthesia Plan Comments:         Anesthesia Quick Evaluation

## 2018-10-03 NOTE — Anesthesia Procedure Notes (Signed)
Procedure Name: Intubation Date/Time: 10/03/2018 10:08 AM Performed by: Glory Buff, CRNA Pre-anesthesia Checklist: Patient identified, Emergency Drugs available, Suction available and Patient being monitored Patient Re-evaluated:Patient Re-evaluated prior to induction Oxygen Delivery Method: Circle system utilized Preoxygenation: Pre-oxygenation with 100% oxygen Induction Type: IV induction Ventilation: Mask ventilation without difficulty Laryngoscope Size: Miller and 3 Grade View: Grade I Tube type: Oral Tube size: 7.0 mm Number of attempts: 1 Airway Equipment and Method: Stylet and Oral airway Placement Confirmation: ETT inserted through vocal cords under direct vision,  positive ETCO2 and breath sounds checked- equal and bilateral Secured at: 20 cm Tube secured with: Tape Dental Injury: Teeth and Oropharynx as per pre-operative assessment

## 2018-10-04 ENCOUNTER — Encounter (HOSPITAL_COMMUNITY): Payer: Self-pay | Admitting: Surgery

## 2018-10-04 LAB — CBC WITH DIFFERENTIAL/PLATELET
Abs Immature Granulocytes: 0.18 10*3/uL — ABNORMAL HIGH (ref 0.00–0.07)
Basophils Absolute: 0.1 10*3/uL (ref 0.0–0.1)
Basophils Relative: 0 %
EOS ABS: 0 10*3/uL (ref 0.0–0.5)
Eosinophils Relative: 0 %
HCT: 34.1 % — ABNORMAL LOW (ref 36.0–46.0)
Hemoglobin: 9.7 g/dL — ABNORMAL LOW (ref 12.0–15.0)
Immature Granulocytes: 1 %
Lymphocytes Relative: 29 %
Lymphs Abs: 4.5 10*3/uL — ABNORMAL HIGH (ref 0.7–4.0)
MCH: 27.7 pg (ref 26.0–34.0)
MCHC: 28.4 g/dL — ABNORMAL LOW (ref 30.0–36.0)
MCV: 97.4 fL (ref 80.0–100.0)
MONO ABS: 1.2 10*3/uL — AB (ref 0.1–1.0)
Monocytes Relative: 8 %
Neutro Abs: 9.4 10*3/uL — ABNORMAL HIGH (ref 1.7–7.7)
Neutrophils Relative %: 62 %
PLATELETS: 413 10*3/uL — AB (ref 150–400)
RBC: 3.5 MIL/uL — AB (ref 3.87–5.11)
RDW: 17.5 % — AB (ref 11.5–15.5)
WBC: 15.4 10*3/uL — AB (ref 4.0–10.5)
nRBC: 0 % (ref 0.0–0.2)

## 2018-10-04 LAB — COMPREHENSIVE METABOLIC PANEL
ALBUMIN: 2.8 g/dL — AB (ref 3.5–5.0)
ALT: 109 U/L — AB (ref 0–44)
ANION GAP: 10 (ref 5–15)
AST: 69 U/L — AB (ref 15–41)
Alkaline Phosphatase: 145 U/L — ABNORMAL HIGH (ref 38–126)
BUN: 13 mg/dL (ref 8–23)
CALCIUM: 8.4 mg/dL — AB (ref 8.9–10.3)
CO2: 19 mmol/L — ABNORMAL LOW (ref 22–32)
Chloride: 108 mmol/L (ref 98–111)
Creatinine, Ser: 1 mg/dL (ref 0.44–1.00)
GFR calc Af Amer: >60 mL/min (ref >60)
GFR, EST NON AFRICAN AMERICAN: 55 mL/min — AB (ref >60)
GLUCOSE: 146 mg/dL — AB (ref 70–99)
Potassium: 5.2 mmol/L — ABNORMAL HIGH (ref 3.5–5.1)
Sodium: 137 mmol/L (ref 135–145)
TOTAL PROTEIN: 6.3 g/dL — AB (ref 6.5–8.1)
Total Bilirubin: 1.3 mg/dL — ABNORMAL HIGH (ref 0.3–1.2)

## 2018-10-04 LAB — GLUCOSE, CAPILLARY
GLUCOSE-CAPILLARY: 183 mg/dL — AB (ref 70–99)
GLUCOSE-CAPILLARY: 220 mg/dL — AB (ref 70–99)
GLUCOSE-CAPILLARY: 260 mg/dL — AB (ref 70–99)
Glucose-Capillary: 149 mg/dL — ABNORMAL HIGH (ref 70–99)

## 2018-10-04 LAB — POTASSIUM: Potassium: 4.2 mmol/L (ref 3.5–5.1)

## 2018-10-04 MED ORDER — BISACODYL 5 MG PO TBEC
5.0000 mg | DELAYED_RELEASE_TABLET | Freq: Once | ORAL | Status: AC
  Administered 2018-10-04: 5 mg via ORAL
  Filled 2018-10-04: qty 1

## 2018-10-04 MED ORDER — HYDROCODONE-ACETAMINOPHEN 5-325 MG PO TABS: 1.0000 | ORAL_TABLET | ORAL | 0 refills | Status: DC | PRN

## 2018-10-04 NOTE — Progress Notes (Signed)
Discharge instructions and medications discussed with patient.  AVS given to patient.  All questions answered.  

## 2018-10-04 NOTE — Discharge Instructions (Signed)
[facility] - DISCHARGE INSTRUCTIONS TO PATIENT  Activity:  Driving - May drive in 2 to 4 days, when off pain meds and feeling okay.   Lifting - No lifting more than 15 pounds for one week.  But stay active and move.  Wound Care:   May shower starting tomorrow  Diet:  As tolerated.  Follow up appointment:  Call Dr. Pollie Friar office [facility]) at [phone] for an appointment in 2 - 3 weeks.  Medications and dosages:  Resume your home medications.  Call Dr. Lucia Gaskins or his office  [phone]) if you have:  Temperature greater than 100.4,  Persistent nausea and vomiting,  Severe uncontrolled pain,  Redness, tenderness, or signs of infection (pain, swelling, redness, odor or green/yellow discharge around the site),  Difficulty breathing, headache or visual disturbances,  Any other questions or concerns you may have after discharge.  In an emergency, call 911 or go to an Emergency Department at a nearby hospital.

## 2018-10-04 NOTE — Discharge Summary (Signed)
Physician Discharge Summary  [name] POL:[id number] DOB: 08-Sep-1946 DOA: 10/01/2018  PCP: Serita Grammes, MD  Admit date: 10/01/2018 Discharge date: 10/04/2018  Time spent: 45 minutes  Recommendations for Outpatient Follow-up:  Patient will be discharged to home.  Patient will need to follow up with primary care provider within one week of discharge.  Follow-up with general surgery in 2 to 3 weeks.  Patient should continue medications as prescribed.  Patient should follow a carb modified diet.   Discharge Diagnoses:  Acute biliary pancreatitis COPD Diabetes mellitus, type II History of thrombocytosis Normocytic anemia Hyperkalemia  Discharge Condition: Stable  Diet recommendation: carb modified  Filed Weights   10/01/18 2100 10/02/18 0452 10/04/18 0500  Weight: 73.8 kg 69.9 kg 73.1 kg    History of present illness:  On 10/01/2018 by Dr. Christia Reading Gamble Pam Gamble is a 72 y.o. female with medical history significant for COPD, type 2 diabetes mellitus, hypertension, and hyperlipidemia, now presenting to the emergency department for evaluation of right upper quadrant abdominal pain with nausea, vomiting, and chills.  Patient reports that she was in her usual state of health when the symptoms developed 3 days ago.  Pain is localized to the right upper quadrant, severe at times, waxing and waning, and associated with nausea and nonbloody vomiting.  She denies any significant shortness of breath, wheezing, cough, or chest pain.  No diarrhea.  Hospital Course:  Acute biliary pancreatitis/gallstone pancreatitis -Patient presented with abdominal pain as well as nausea and vomiting which have resolved.  Ultrasound the gallbladder showed stones, borderline gallbladder wall thickening, normal CBD. -Gastroenterology consulted and appreciated, patient underwent MRCP which showed gallbladder with stones. -General surgery consulted and appreciated, status post laparoscopic  cholecystectomy -Continue pain medications, follow with general surgery in 2 to 3 weeks  COPD -Stable, continue home medications  Diabetes mellitus, type II -Continue home medications on discharge  History of thrombocytosis -Chronic, stable.  Status post splenectomy  Hyperkalemia -resolved  Normocytic anemia -hemoglobin on admission9.9, currently 9.7.  Has been stable for the last several days.  Hemoglobin in 2018 was 12.1. -Patient should follow-up with PCP as an outpatient  Procedures: RUQ Ultrasound MRCP Laparoscopic cholecystectomy  Consultations: Gastroenterology General surgery  Discharge Exam: Vitals:   10/03/18 2045 10/04/18 0438  BP: (!) 149/65 (!) 142/63  Pulse: 62 (!) 54  Resp: 20 20  Temp:  97.7 F (36.5 C)  SpO2: 97% 96%   Patient still complains of some abdominal pain but states it is better than it has been.  No further nausea or vomiting.  Denies current chest pain, shortness of breath, dizziness or headache.  Also complains of constipation.   General: Well developed, well nourished, NAD, appears stated age  46: NCAT, mucous membranes moist.  Neck: Supple  Cardiovascular: S1 S2 auscultated, RRR, no murmur  Respiratory: Clear to auscultation bilaterally with equal chest rise  Abdomen: Soft, nontender, nondistended, + bowel sounds  Extremities: warm dry without cyanosis clubbing or edema  Neuro: AAOx3, nonfocal  Psych: appropriate mood and affect  Discharge Instructions Discharge Instructions    Discharge instructions   Complete by:  As directed    Patient will be discharged to home.  Patient will need to follow up with primary care provider within one week of discharge.  Follow-up with general surgery in 2 to 3 weeks.  Patient should continue medications as prescribed.  Patient should follow a carb modified diet.     Allergies as of 10/04/2018  Reactions   Amoxicillin-pot Clavulanate Nausea Only, Other (See Comments)    REACTION: diarrhea "sick to stomach" Has patient had a PCN reaction causing immediate rash, facial/tongue/throat swelling, SOB or lightheadedness with hypotension: no Has patient had a PCN reaction causing severe rash involving mucus membranes or skin necrosis: no Has patient had a PCN reaction that required hospitalization: no Has patient had a PCN reaction occurring within the last 10 years: no If all of the above answers are "NO", then may proceed with Cephalosporin use.   Ertugliflozin    Steglatro -  - "it made me feel funny"   Levaquin [levofloxacin] Nausea Only      Medication List    STOP taking these medications   fluconazole 150 MG tablet Commonly known as:  DIFLUCAN   tiZANidine 4 MG tablet Commonly known as:  ZANAFLEX   traMADol 50 MG tablet Commonly known as:  ULTRAM     TAKE these medications   albuterol 108 (90 Base) MCG/ACT inhaler Commonly known as:  PROVENTIL HFA;VENTOLIN HFA Inhale 2 puffs into the lungs every 6 (six) hours as needed for wheezing or shortness of breath. What changed:  Another medication with the same name was removed. Continue taking this medication, and follow the directions you see here.   Vitamin D-3 125 MCG (5000 UT) Tabs Take 125 mcg by mouth daily.   cholecalciferol 1000 units tablet Commonly known as:  VITAMIN D Take 5,000 Units by mouth daily.   fluticasone 50 MCG/ACT nasal spray Commonly known as:  FLONASE Place 2 sprays into the nose daily as needed for rhinitis. Reported on 01/23/2016   Fluticasone-Umeclidin-Vilant 100-62.5-25 MCG/INH Aepb Inhale 1 puff into the lungs daily.   glucose blood test strip USE 1 STRIP TO CHECK GLUCOSE TWICE DAILY   HYDROcodone-acetaminophen 5-325 MG tablet Commonly known as:  NORCO/VICODIN Take 1-2 tablets by mouth every 4 (four) hours as needed for moderate pain.   insulin glargine 100 unit/mL Sopn Commonly known as:  LANTUS Inject 35 Units into the skin daily.   insulin NPH Human 100  UNIT/ML injection Commonly known as:  HUMULIN N,NOVOLIN N Use 40 u in am and 10 u qhs. Titrate up by 1 unit a day each (AM and HS doses) for goal sugars of 100-130 up to 60 units in AM and 20 u at HS What changed:    how much to take  how to take this  when to take this  additional instructions   Insulin Syringe-Needle U-100 31G X 5/16" 0.5 ML Misc Use qd   ipratropium-albuterol 0.5-2.5 (3) MG/3ML Soln Commonly known as:  DUONEB Inhale 3 mLs into the lungs as needed.   levocetirizine 5 MG tablet Commonly known as:  XYZAL Take 5 mg by mouth at bedtime.   loratadine 10 MG tablet Commonly known as:  CLARITIN Take 1 tablet (10 mg total) by mouth daily.   metFORMIN 1000 MG tablet Commonly known as:  GLUCOPHAGE TAKE ONE TABLET BY MOUTH TWICE DAILY WITH MEALS   metoprolol tartrate 50 MG tablet Commonly known as:  LOPRESSOR Take 1 tablet (50 mg total) by mouth once for 1 dose. Take 1 hour prior to your coronary CT.   montelukast 10 MG tablet Commonly known as:  SINGULAIR Take 10 mg by mouth daily.   naproxen sodium 220 MG tablet Commonly known as:  ALEVE Take 440 mg by mouth as needed (pain/headache).   omeprazole 40 MG capsule Commonly known as:  PRILOSEC Take 1 capsule (40 mg total)  by mouth daily.   ONE TOUCH LANCETS Misc 1 each by Does not apply route 2 (two) times daily.   ONE TOUCH ULTRA SYSTEM KIT w/Device Kit 1 kit by Does not apply route once.   OVER THE COUNTER MEDICATION Apply 1 application topically as needed (pain).   rosuvastatin 5 MG tablet Commonly known as:  CRESTOR Take 1 tablet (5 mg total) by mouth daily.   SYSTANE 0.4-0.3 % Gel ophthalmic gel Generic drug:  Polyethyl Glycol-Propyl Glycol Place 1-2 application into both eyes as needed (dry eyes).   ZYRTEC PO Take 1 tablet by mouth daily.      Allergies  Allergen Reactions  . Amoxicillin-Pot Clavulanate Nausea Only and Other (See Comments)    REACTION: diarrhea "sick to  stomach" Has patient had a PCN reaction causing immediate rash, facial/tongue/throat swelling, SOB or lightheadedness with hypotension: no Has patient had a PCN reaction causing severe rash involving mucus membranes or skin necrosis: no Has patient had a PCN reaction that required hospitalization: no Has patient had a PCN reaction occurring within the last 10 years: no If all of the above answers are "NO", then may proceed with Cephalosporin use.   . Ertugliflozin     Steglatro -  - "it made me feel funny"  . Levaquin [Levofloxacin] Nausea Only   Follow-up Information    Serita Grammes, MD. Schedule an appointment as soon as possible for a visit in 1 week(s).   Specialty:  Family Medicine Why:  Hospital follow up Contact information: [place] [address] [phone]        Alphonsa Overall, MD. Schedule an appointment as soon as possible for a visit in 2 week(s).   Specialty:  General Surgery Why:  Hospital follow up Contact information: [place] [address] [phone]            The results of significant diagnostics from this hospitalization (including imaging, microbiology, ancillary and laboratory) are listed below for reference.    Significant Diagnostic Studies: Dg Cholangiogram Operative  Result Date: 10/03/2018 CLINICAL DATA:  Cholecystectomy for cholelithiasis, chronic cholecystitis and gallstone pancreatitis. EXAM: INTRAOPERATIVE CHOLANGIOGRAM TECHNIQUE: Cholangiographic images from the C-arm fluoroscopic device were submitted for interpretation post-operatively. Please see the procedural report for the amount of contrast and the fluoroscopy time utilized. COMPARISON:  MRCP on 10/02/2018 FINDINGS: Intraoperative imaging with a C-arm demonstrates normal opacification of the biliary tree without evidence obstruction or filling defect. Contrast enters the duodenum normally. No contrast extravasation identified.  IMPRESSION: Normal intraoperative cholangiogram. Electronically Signed   By: Aletta Edouard M.D.   On: 10/03/2018 12:40   Mr 3d Recon At Scanner  Result Date: 10/02/2018 CLINICAL DATA:  Pancreatitis, acute, SIRS, elev WBC, fever EXAM: MRI ABDOMEN WITHOUT AND WITH CONTRAST (INCLUDING MRCP) TECHNIQUE: Multiplanar multisequence MR imaging of the abdomen was performed both before and after the administration of intravenous contrast. Heavily T2-weighted images of the biliary and pancreatic ducts were obtained, and three-dimensional MRCP images were rendered by post processing. CONTRAST:  7 mL Gadavist COMPARISON:  CT 1121 19 FINDINGS: Lower chest:  Lung bases are clear. Hepatobiliary: Multiple gallstones packed the lumen of the gallbladder. No gallbladder distension. The common bile duct is normal caliber measuring 5 mm the pancreatic head. No filling defect within the common bile duct no external compression. A significant hepatic steatosis noted on the opposed phase imaging (series 9) at region of absent loss of signal intensity on opposed phase imaging in the  central LEFT hepatic lobe (image 59/9 corresponds to the hyperenhancing lesion on comparison CT is most consistent with focal fatty sparing. The other smaller lesions described on comparison CT are more difficult to define but are favored to represent the same phenomena. Addition there several small round lesions in the liver which are hyperintense on T2 weighted imaging. Some of these demonstrate early arterial enhancement while others demonstrate no enhancement which is consistent with a combination of benign cyst and benign flash filling hemangiomas. They are more hemangiomas and cysts and these persist on the more delayed imaging typical of hemangioma. Pancreas: Normal pancreatic parenchymal intensity. Mild inflammation along the pancreatic head and second portion duodenum. No pancreatic duct dilatation. No abnormal fluid collection. No ductal  dilatation or inflammation. Spleen: Small spleen or splenule in the LEFT upper quadrant Adrenals/urinary tract: Adrenal glands and kidneys are normal. Stomach/Bowel: Stomach and limited of the small bowel is unremarkable Vascular/Lymphatic: Abdominal aortic normal caliber. No retroperitoneal periportal lymphadenopathy. Musculoskeletal: Hemangioma in the L2 vertebral body IMPRESSION: 1. Multiple gallstones pack the gallbladder limited. No evidence acute cholecystitis. No choledocholithiasis or biliary obstruction. 2. Hepatic steatosis. 3. Foci of focal fatty sparing in the LEFT hepatic lobe corresponds to abnormal CT findings. 4. Multiple small benign flash filling hemangioma within liver. 5. Mild inflammation of the junction the pancreas and duodenum. No fluid collections. Normal pancreatic duct. Electronically Signed   By: Suzy Bouchard M.D.   On: 10/02/2018 17:05   Mr Abdomen Mrcp W Wo Contast  Result Date: 10/02/2018 CLINICAL DATA:  Pancreatitis, acute, SIRS, elev WBC, fever EXAM: MRI ABDOMEN WITHOUT AND WITH CONTRAST (INCLUDING MRCP) TECHNIQUE: Multiplanar multisequence MR imaging of the abdomen was performed both before and after the administration of intravenous contrast. Heavily T2-weighted images of the biliary and pancreatic ducts were obtained, and three-dimensional MRCP images were rendered by post processing. CONTRAST:  7 mL Gadavist COMPARISON:  CT 1121 19 FINDINGS: Lower chest:  Lung bases are clear. Hepatobiliary: Multiple gallstones packed the lumen of the gallbladder. No gallbladder distension. The common bile duct is normal caliber measuring 5 mm the pancreatic head. No filling defect within the common bile duct no external compression. A significant hepatic steatosis noted on the opposed phase imaging (series 9) at region of absent loss of signal intensity on opposed phase imaging in the central LEFT hepatic lobe (image 59/9 corresponds to the hyperenhancing lesion on comparison CT is most  consistent with focal fatty sparing. The other smaller lesions described on comparison CT are more difficult to define but are favored to represent the same phenomena. Addition there several small round lesions in the liver which are hyperintense on T2 weighted imaging. Some of these demonstrate early arterial enhancement while others demonstrate no enhancement which is consistent with a combination of benign cyst and benign flash filling hemangiomas. They are more hemangiomas and cysts and these persist on the more delayed imaging typical of hemangioma. Pancreas: Normal pancreatic parenchymal intensity. Mild inflammation along the pancreatic head and second portion duodenum. No pancreatic duct dilatation. No abnormal fluid collection. No ductal dilatation or inflammation. Spleen: Small spleen or splenule in the LEFT upper quadrant Adrenals/urinary tract: Adrenal glands and kidneys are normal. Stomach/Bowel: Stomach and limited of the small bowel is unremarkable Vascular/Lymphatic: Abdominal aortic normal caliber. No retroperitoneal periportal lymphadenopathy. Musculoskeletal: Hemangioma in the L2 vertebral body IMPRESSION: 1. Multiple gallstones pack the gallbladder limited. No evidence acute cholecystitis. No choledocholithiasis or biliary obstruction. 2. Hepatic steatosis. 3. Foci of focal fatty sparing in the  LEFT hepatic lobe corresponds to abnormal CT findings. 4. Multiple small benign flash filling hemangioma within liver. 5. Mild inflammation of the junction the pancreas and duodenum. No fluid collections. Normal pancreatic duct. Electronically Signed   By: Suzy Bouchard M.D.   On: 10/02/2018 17:05   US Abdomen Limited Ruq  Result Date: 10/02/2018 CLINICAL DATA:  Acute biliary pancreatitis. EXAM: ULTRASOUND ABDOMEN LIMITED RIGHT UPPER QUADRANT COMPARISON:  CT 10/01/2018 FINDINGS: Gallbladder: Gallbladder filled with gallstones and debris/sludge. Gallbladder wall is borderline thickened at 3.5 mm.  Negative sonographic Murphy's. Common bile duct: Diameter: Normal caliber, 1-2 mm. Liver: Mild diffusely increased echotexture suggesting early fatty infiltration. No focal hepatic abnormality. Portal vein is patent on color Doppler imaging with normal direction of blood flow towards the liver. IMPRESSION: Gallbladder filled with gallstones and sludge. Borderline gallbladder wall thickness. No sonographic Murphy's sign. Early fatty infiltration of the liver. Electronically Signed   By: Rolm Baptise M.D.   On: 10/02/2018 09:36    Microbiology: Recent Results (from the past 240 hour(s))  Culture, blood (routine x 2)     Status: None (Preliminary result)   Collection Time: 10/01/18  9:11 PM  Result Value Ref Range Status   Specimen Description   Final    BLOOD RIGHT HAND Performed at [facility], [place] [address] [place], [place] [address]    Special Requests   Final    BOTTLES DRAWN AEROBIC AND ANAEROBIC Blood Culture adequate volume Performed at [place] [address] [place], [place] [address]    Culture   Final    NO GROWTH 1 DAY Performed at [facility], [place] [address], [place], [place] [address]    Report Status PENDING  Incomplete  Culture, blood (routine x 2)     Status: None (Preliminary result)   Collection Time: 10/01/18  9:14 PM  Result Value Ref Range Status   Specimen Description   Final    BLOOD RIGHT ARM Performed at [facility] [address], [place], [place] [address]    Special Requests   Final    BOTTLES DRAWN AEROBIC AND ANAEROBIC Blood Culture adequate volume Performed at [facility] [address] [place], [place] [address]    Culture   Final    NO GROWTH 1 DAY Performed at [facility], [place] [address], [place], [place] [address]    Report Status PENDING  Incomplete  MRSA PCR Screening     Status: None   Collection Time: 10/03/18  8:17 AM  Result Value Ref  Range Status   MRSA by PCR NEGATIVE NEGATIVE Final    Comment:        The GeneXpert MRSA Assay (FDA approved for NASAL specimens only), is one component of a comprehensive MRSA colonization surveillance program. It is not intended to diagnose MRSA infection nor to guide or monitor treatment for MRSA infections. Performed at [facility], [place] [address], [place], [place] [address]      Labs: Basic Metabolic Panel: Recent Labs  Lab 10/02/18 [phone] 10/02/18 1132 10/03/18 0823 10/04/18 0540 10/04/18 1330  NA 138 137 140 137  --   K 4.3 4.1 4.4 5.2* 4.2  CL 108 107 109 108  --   CO2 23 22 20* 19*  --   GLUCOSE 144* 126* 182* 146*  --   BUN '13 14 11 13  ' --   CREATININE 0.80 0.90 0.98 1.00  --  CALCIUM 7.8* 8.0* 8.6* 8.4*  --    Liver Function Tests: Recent Labs  Lab 10/02/18 0619 10/02/18 1132 10/03/18 0823 10/04/18 0540  AST 265* 219* 134* 69*  ALT 184* 176* 140* 109*  ALKPHOS 136* 150* 154* 145*  BILITOT 4.4* 4.5* 1.9* 1.3*  PROT 6.1* 6.5 6.6 6.3*  ALBUMIN 2.6* 2.8* 2.7* 2.8*   Recent Labs  Lab 10/01/18 2107 10/02/18 0619 10/03/18 0823  LIPASE 146* 212* 113*   No results for input(s): AMMONIA in the last 168 hours. CBC: Recent Labs  Lab 10/02/18 0619 10/03/18 0509 10/04/18 0540  WBC 12.8* 8.4 15.4*  NEUTROABS 7.8* 4.4 9.4*  HGB 9.9* 9.8* 9.7*  HCT 33.2* 33.0* 34.1*  MCV 91.2 92.4 97.4  PLT 451* 415* 413*   Cardiac Enzymes: No results for input(s): CKTOTAL, CKMB, CKMBINDEX, TROPONINI in the last 168 hours. BNP: BNP (last 3 results) No results for input(s): BNP in the last 8760 hours.  ProBNP (last 3 results) No results for input(s): PROBNP in the last 8760 hours.  CBG: Recent Labs  Lab 10/03/18 2047 10/04/18 0108 10/04/18 0435 10/04/18 0742 10/04/18 1140  GLUCAP 231* 183* 149* 220* 260*       Signed:  Melani Brisbane  [facility] 10/04/2018, 2:25 PM

## 2018-10-04 NOTE — Progress Notes (Signed)
[facility]:  [phone] General Surgery Progress Note   LOS: 3 days  POD -  1 Day Post-Op  Chief Complaint: Abdominal pain  Assessment and Plan: 1.  Lap chole with IOC -10/03/2018 - D. Ottis Sarnowski  For gallstone pancreatitis  T Bili - 1.3 - 10/04/2018  Looks good.  Okay to go home from my standpoint.  To follow up in my office in 2 3 weeks for post op visit. I have put instructions in the d/c instructions.  2.  DM - managed by PCP - Dr. Serita Grammes in [place]  Poorly controlled 3.  COPD - sees Dr. Nolon Rod in [place]  On and off steroids - last steroids about 3 weeks ago 4.  Prior splenectomy, then repair of hernia with mesh - 1999 5.  Anemia  Hgb - 9.7 - 10/04/2018 6. DVT prophylaxis - Lovenox 7.  History of kidney stones  Sees Dr. Nila Nephew in [place]   Principal Problem:   Acute biliary pancreatitis Active Problems:   Diabetes type 2, uncontrolled ([place])   COPD (chronic obstructive pulmonary disease) (H[facility])   Acute cholangitis  Subjective:  Feeling better.  Some RLQ pain.  Wants to stay through lunch.  She has a friend, Belinda Fisher, coming up from [place].  Objective:   Vitals:   10/03/18 2045 10/04/18 0438  BP: (!) 149/65 (!) 142/63  Pulse: 62 (!) 54  Resp: 20 20  Temp:  97.7 F (36.5 C)  SpO2: 97% 96%     Intake/Output from previous day:  11/23 0701 - 11/24 0700 In: 1246.1 [I.V.:997.6; IV Piggyback:248.6] Out: 25 [Blood:25]  Intake/Output this shift:  Total I/O In: 240 [P.O.:240] Out: -    Physical Exam:   General: Mildly obese WF who is alert and oriented.    HEENT: Normal. Pupils equal. .   Lungs: Clear   Abdomen: Mild distention.  She has BS. All incisions looks good.   Lab Results:    Recent Labs    10/03/18 0509 10/04/18 0540  WBC 8.4 15.4*  HGB 9.8* 9.7*  HCT 33.0* 34.1*  PLT 415* 413*    BMET   Recent Labs    10/03/18 0823 10/04/18 0540  NA 140 137  K 4.4 5.2*  CL 109 108  CO2 20* 19*  GLUCOSE  182* 146*  BUN 11 13  CREATININE 0.98 1.00  CALCIUM 8.6* 8.4*    PT/INR  No results for input(s): LABPROT, INR in the last 72 hours.  ABG  No results for input(s): PHART, HCO3 in the last 72 hours.  Invalid input(s): PCO2, PO2   Studies/Results:  Dg Cholangiogram Operative  Result Date: 10/03/2018 CLINICAL DATA:  Cholecystectomy for cholelithiasis, chronic cholecystitis and gallstone pancreatitis. EXAM: INTRAOPERATIVE CHOLANGIOGRAM TECHNIQUE: Cholangiographic images from the C-arm fluoroscopic device were submitted for interpretation post-operatively. Please see the procedural report for the amount of contrast and the fluoroscopy time utilized. COMPARISON:  MRCP on 10/02/2018 FINDINGS: Intraoperative imaging with a C-arm demonstrates normal opacification of the biliary tree without evidence obstruction or filling defect. Contrast enters the duodenum normally. No contrast extravasation identified. IMPRESSION: Normal intraoperative cholangiogram. Electronically Signed   By: Aletta Edouard M.D.   On: 10/03/2018 12:40   Mr 3d Recon At Scanner  Result Date: 10/02/2018 CLINICAL DATA:  Pancreatitis, acute, SIRS, elev WBC, fever EXAM: MRI ABDOMEN WITHOUT AND WITH CONTRAST (INCLUDING MRCP) TECHNIQUE: Multiplanar multisequence MR imaging of the abdomen was performed both before and after the administration of intravenous contrast. Heavily T2-weighted images  of the biliary and pancreatic ducts were obtained, and three-dimensional MRCP images were rendered by post processing. CONTRAST:  7 mL Gadavist COMPARISON:  CT 1121 19 FINDINGS: Lower chest:  Lung bases are clear. Hepatobiliary: Multiple gallstones packed the lumen of the gallbladder. No gallbladder distension. The common bile duct is normal caliber measuring 5 mm the pancreatic head. No filling defect within the common bile duct no external compression. A significant hepatic steatosis noted on the opposed phase imaging (series 9) at region of absent  loss of signal intensity on opposed phase imaging in the central LEFT hepatic lobe (image 59/9 corresponds to the hyperenhancing lesion on comparison CT is most consistent with focal fatty sparing. The other smaller lesions described on comparison CT are more difficult to define but are favored to represent the same phenomena. Addition there several small round lesions in the liver which are hyperintense on T2 weighted imaging. Some of these demonstrate early arterial enhancement while others demonstrate no enhancement which is consistent with a combination of benign cyst and benign flash filling hemangiomas. They are more hemangiomas and cysts and these persist on the more delayed imaging typical of hemangioma. Pancreas: Normal pancreatic parenchymal intensity. Mild inflammation along the pancreatic head and second portion duodenum. No pancreatic duct dilatation. No abnormal fluid collection. No ductal dilatation or inflammation. Spleen: Small spleen or splenule in the LEFT upper quadrant Adrenals/urinary tract: Adrenal glands and kidneys are normal. Stomach/Bowel: Stomach and limited of the small bowel is unremarkable Vascular/Lymphatic: Abdominal aortic normal caliber. No retroperitoneal periportal lymphadenopathy. Musculoskeletal: Hemangioma in the L2 vertebral body IMPRESSION: 1. Multiple gallstones pack the gallbladder limited. No evidence acute cholecystitis. No choledocholithiasis or biliary obstruction. 2. Hepatic steatosis. 3. Foci of focal fatty sparing in the LEFT hepatic lobe corresponds to abnormal CT findings. 4. Multiple small benign flash filling hemangioma within liver. 5. Mild inflammation of the junction the pancreas and duodenum. No fluid collections. Normal pancreatic duct. Electronically Signed   By: Suzy Bouchard M.D.   On: 10/02/2018 17:05   Mr Abdomen Mrcp W Wo Contast  Result Date: 10/02/2018 CLINICAL DATA:  Pancreatitis, acute, SIRS, elev WBC, fever EXAM: MRI ABDOMEN WITHOUT AND  WITH CONTRAST (INCLUDING MRCP) TECHNIQUE: Multiplanar multisequence MR imaging of the abdomen was performed both before and after the administration of intravenous contrast. Heavily T2-weighted images of the biliary and pancreatic ducts were obtained, and three-dimensional MRCP images were rendered by post processing. CONTRAST:  7 mL Gadavist COMPARISON:  CT 1121 19 FINDINGS: Lower chest:  Lung bases are clear. Hepatobiliary: Multiple gallstones packed the lumen of the gallbladder. No gallbladder distension. The common bile duct is normal caliber measuring 5 mm the pancreatic head. No filling defect within the common bile duct no external compression. A significant hepatic steatosis noted on the opposed phase imaging (series 9) at region of absent loss of signal intensity on opposed phase imaging in the central LEFT hepatic lobe (image 59/9 corresponds to the hyperenhancing lesion on comparison CT is most consistent with focal fatty sparing. The other smaller lesions described on comparison CT are more difficult to define but are favored to represent the same phenomena. Addition there several small round lesions in the liver which are hyperintense on T2 weighted imaging. Some of these demonstrate early arterial enhancement while others demonstrate no enhancement which is consistent with a combination of benign cyst and benign flash filling hemangiomas. They are more hemangiomas and cysts and these persist on the more delayed imaging typical of hemangioma. Pancreas: Normal pancreatic  parenchymal intensity. Mild inflammation along the pancreatic head and second portion duodenum. No pancreatic duct dilatation. No abnormal fluid collection. No ductal dilatation or inflammation. Spleen: Small spleen or splenule in the LEFT upper quadrant Adrenals/urinary tract: Adrenal glands and kidneys are normal. Stomach/Bowel: Stomach and limited of the small bowel is unremarkable Vascular/Lymphatic: Abdominal aortic normal caliber.  No retroperitoneal periportal lymphadenopathy. Musculoskeletal: Hemangioma in the L2 vertebral body IMPRESSION: 1. Multiple gallstones pack the gallbladder limited. No evidence acute cholecystitis. No choledocholithiasis or biliary obstruction. 2. Hepatic steatosis. 3. Foci of focal fatty sparing in the LEFT hepatic lobe corresponds to abnormal CT findings. 4. Multiple small benign flash filling hemangioma within liver. 5. Mild inflammation of the junction the pancreas and duodenum. No fluid collections. Normal pancreatic duct. Electronically Signed   By: Suzy Bouchard M.D.   On: 10/02/2018 17:05     Anti-infectives:   Anti-infectives (From admission, onward)   Start     Dose/Rate Route Frequency Ordered Stop   10/03/18 1400  piperacillin-tazobactam (ZOSYN) IVPB 3.375 g     3.375 g 12.5 mL/hr over 240 Minutes Intravenous Every 8 hours 10/03/18 1252 10/04/18 0924   10/01/18 2200  piperacillin-tazobactam (ZOSYN) IVPB 3.375 g  Status:  Discontinued     3.375 g 12.5 mL/hr over 240 Minutes Intravenous Every 8 hours 10/01/18 2127 10/03/18 1252      Alphonsa Overall, MD, FACS Pager: [facility] Office: [phone] 10/04/2018

## 2018-10-06 DIAGNOSIS — Z6828 Body mass index (BMI) 28.0-28.9, adult: Secondary | ICD-10-CM | POA: Diagnosis not present

## 2018-10-06 DIAGNOSIS — E119 Type 2 diabetes mellitus without complications: Secondary | ICD-10-CM | POA: Diagnosis not present

## 2018-10-06 DIAGNOSIS — Z794 Long term (current) use of insulin: Secondary | ICD-10-CM | POA: Diagnosis not present

## 2018-10-06 DIAGNOSIS — Z9049 Acquired absence of other specified parts of digestive tract: Secondary | ICD-10-CM | POA: Diagnosis not present

## 2018-10-06 DIAGNOSIS — Z7189 Other specified counseling: Secondary | ICD-10-CM | POA: Diagnosis not present

## 2018-10-06 DIAGNOSIS — K851 Biliary acute pancreatitis without necrosis or infection: Secondary | ICD-10-CM | POA: Diagnosis not present

## 2018-10-07 LAB — CULTURE, BLOOD (ROUTINE X 2)
CULTURE: NO GROWTH
Culture: NO GROWTH
SPECIAL REQUESTS: ADEQUATE
SPECIAL REQUESTS: ADEQUATE

## 2018-10-16 DIAGNOSIS — Z6828 Body mass index (BMI) 28.0-28.9, adult: Secondary | ICD-10-CM | POA: Diagnosis not present

## 2018-10-16 DIAGNOSIS — J441 Chronic obstructive pulmonary disease with (acute) exacerbation: Secondary | ICD-10-CM | POA: Diagnosis not present

## 2018-10-16 DIAGNOSIS — Z794 Long term (current) use of insulin: Secondary | ICD-10-CM | POA: Diagnosis not present

## 2018-10-16 DIAGNOSIS — E119 Type 2 diabetes mellitus without complications: Secondary | ICD-10-CM | POA: Diagnosis not present

## 2018-10-22 DIAGNOSIS — R69 Illness, unspecified: Secondary | ICD-10-CM | POA: Diagnosis not present

## 2018-10-30 DIAGNOSIS — E119 Type 2 diabetes mellitus without complications: Secondary | ICD-10-CM | POA: Diagnosis not present

## 2018-10-30 DIAGNOSIS — J449 Chronic obstructive pulmonary disease, unspecified: Secondary | ICD-10-CM | POA: Diagnosis not present

## 2018-10-30 DIAGNOSIS — Z794 Long term (current) use of insulin: Secondary | ICD-10-CM | POA: Diagnosis not present

## 2018-10-30 DIAGNOSIS — Z6827 Body mass index (BMI) 27.0-27.9, adult: Secondary | ICD-10-CM | POA: Diagnosis not present

## 2018-10-30 DIAGNOSIS — J302 Other seasonal allergic rhinitis: Secondary | ICD-10-CM | POA: Diagnosis not present

## 2018-11-30 DIAGNOSIS — J449 Chronic obstructive pulmonary disease, unspecified: Secondary | ICD-10-CM | POA: Diagnosis not present

## 2018-11-30 DIAGNOSIS — K219 Gastro-esophageal reflux disease without esophagitis: Secondary | ICD-10-CM | POA: Diagnosis not present

## 2018-11-30 DIAGNOSIS — E119 Type 2 diabetes mellitus without complications: Secondary | ICD-10-CM | POA: Diagnosis not present

## 2018-11-30 DIAGNOSIS — Z6828 Body mass index (BMI) 28.0-28.9, adult: Secondary | ICD-10-CM | POA: Diagnosis not present

## 2018-11-30 DIAGNOSIS — J302 Other seasonal allergic rhinitis: Secondary | ICD-10-CM | POA: Diagnosis not present

## 2018-11-30 DIAGNOSIS — Z794 Long term (current) use of insulin: Secondary | ICD-10-CM | POA: Diagnosis not present

## 2019-01-06 DIAGNOSIS — K219 Gastro-esophageal reflux disease without esophagitis: Secondary | ICD-10-CM | POA: Diagnosis not present

## 2019-01-06 DIAGNOSIS — J449 Chronic obstructive pulmonary disease, unspecified: Secondary | ICD-10-CM | POA: Diagnosis not present

## 2019-01-06 DIAGNOSIS — E119 Type 2 diabetes mellitus without complications: Secondary | ICD-10-CM | POA: Diagnosis not present

## 2019-01-06 DIAGNOSIS — Z6827 Body mass index (BMI) 27.0-27.9, adult: Secondary | ICD-10-CM | POA: Diagnosis not present

## 2019-01-06 DIAGNOSIS — Z794 Long term (current) use of insulin: Secondary | ICD-10-CM | POA: Diagnosis not present

## 2019-01-06 DIAGNOSIS — R69 Illness, unspecified: Secondary | ICD-10-CM | POA: Diagnosis not present

## 2019-02-01 DIAGNOSIS — K219 Gastro-esophageal reflux disease without esophagitis: Secondary | ICD-10-CM | POA: Diagnosis not present

## 2019-02-01 DIAGNOSIS — E119 Type 2 diabetes mellitus without complications: Secondary | ICD-10-CM | POA: Diagnosis not present

## 2019-02-01 DIAGNOSIS — B373 Candidiasis of vulva and vagina: Secondary | ICD-10-CM | POA: Diagnosis not present

## 2019-02-01 DIAGNOSIS — Z6827 Body mass index (BMI) 27.0-27.9, adult: Secondary | ICD-10-CM | POA: Diagnosis not present

## 2019-02-01 DIAGNOSIS — J449 Chronic obstructive pulmonary disease, unspecified: Secondary | ICD-10-CM | POA: Diagnosis not present

## 2019-02-01 DIAGNOSIS — Z794 Long term (current) use of insulin: Secondary | ICD-10-CM | POA: Diagnosis not present

## 2019-02-09 DIAGNOSIS — J449 Chronic obstructive pulmonary disease, unspecified: Secondary | ICD-10-CM | POA: Diagnosis not present

## 2019-02-09 DIAGNOSIS — K219 Gastro-esophageal reflux disease without esophagitis: Secondary | ICD-10-CM | POA: Diagnosis not present

## 2019-02-16 DIAGNOSIS — E119 Type 2 diabetes mellitus without complications: Secondary | ICD-10-CM | POA: Diagnosis not present

## 2019-02-16 DIAGNOSIS — Z794 Long term (current) use of insulin: Secondary | ICD-10-CM | POA: Diagnosis not present

## 2019-03-04 DIAGNOSIS — E119 Type 2 diabetes mellitus without complications: Secondary | ICD-10-CM | POA: Diagnosis not present

## 2019-03-04 DIAGNOSIS — Z794 Long term (current) use of insulin: Secondary | ICD-10-CM | POA: Diagnosis not present

## 2019-03-11 DIAGNOSIS — E119 Type 2 diabetes mellitus without complications: Secondary | ICD-10-CM | POA: Diagnosis not present

## 2019-03-11 DIAGNOSIS — K219 Gastro-esophageal reflux disease without esophagitis: Secondary | ICD-10-CM | POA: Diagnosis not present

## 2019-04-10 DIAGNOSIS — E119 Type 2 diabetes mellitus without complications: Secondary | ICD-10-CM | POA: Diagnosis not present

## 2019-04-10 DIAGNOSIS — K219 Gastro-esophageal reflux disease without esophagitis: Secondary | ICD-10-CM | POA: Diagnosis not present

## 2019-05-05 DIAGNOSIS — J449 Chronic obstructive pulmonary disease, unspecified: Secondary | ICD-10-CM | POA: Diagnosis not present

## 2019-05-05 DIAGNOSIS — J302 Other seasonal allergic rhinitis: Secondary | ICD-10-CM | POA: Diagnosis not present

## 2019-05-05 DIAGNOSIS — Z13820 Encounter for screening for osteoporosis: Secondary | ICD-10-CM | POA: Diagnosis not present

## 2019-05-05 DIAGNOSIS — Z79899 Other long term (current) drug therapy: Secondary | ICD-10-CM | POA: Diagnosis not present

## 2019-05-05 DIAGNOSIS — E669 Obesity, unspecified: Secondary | ICD-10-CM | POA: Diagnosis not present

## 2019-05-05 DIAGNOSIS — E119 Type 2 diabetes mellitus without complications: Secondary | ICD-10-CM | POA: Diagnosis not present

## 2019-05-05 DIAGNOSIS — Z794 Long term (current) use of insulin: Secondary | ICD-10-CM | POA: Diagnosis not present

## 2019-05-05 DIAGNOSIS — Z6828 Body mass index (BMI) 28.0-28.9, adult: Secondary | ICD-10-CM | POA: Diagnosis not present

## 2019-05-05 DIAGNOSIS — Z0000 Encounter for general adult medical examination without abnormal findings: Secondary | ICD-10-CM | POA: Diagnosis not present

## 2019-05-05 DIAGNOSIS — K219 Gastro-esophageal reflux disease without esophagitis: Secondary | ICD-10-CM | POA: Diagnosis not present

## 2019-05-11 DIAGNOSIS — K219 Gastro-esophageal reflux disease without esophagitis: Secondary | ICD-10-CM | POA: Diagnosis not present

## 2019-05-11 DIAGNOSIS — E119 Type 2 diabetes mellitus without complications: Secondary | ICD-10-CM | POA: Diagnosis not present

## 2019-05-19 DIAGNOSIS — M545 Low back pain: Secondary | ICD-10-CM | POA: Diagnosis not present

## 2019-05-19 DIAGNOSIS — Z6829 Body mass index (BMI) 29.0-29.9, adult: Secondary | ICD-10-CM | POA: Diagnosis not present

## 2019-05-19 DIAGNOSIS — Z794 Long term (current) use of insulin: Secondary | ICD-10-CM | POA: Diagnosis not present

## 2019-05-19 DIAGNOSIS — R0600 Dyspnea, unspecified: Secondary | ICD-10-CM | POA: Diagnosis not present

## 2019-05-19 DIAGNOSIS — E119 Type 2 diabetes mellitus without complications: Secondary | ICD-10-CM | POA: Diagnosis not present

## 2019-05-19 DIAGNOSIS — K219 Gastro-esophageal reflux disease without esophagitis: Secondary | ICD-10-CM | POA: Diagnosis not present

## 2019-05-19 DIAGNOSIS — J449 Chronic obstructive pulmonary disease, unspecified: Secondary | ICD-10-CM | POA: Diagnosis not present

## 2019-05-24 DIAGNOSIS — J4540 Moderate persistent asthma, uncomplicated: Secondary | ICD-10-CM | POA: Diagnosis not present

## 2019-05-24 DIAGNOSIS — G4733 Obstructive sleep apnea (adult) (pediatric): Secondary | ICD-10-CM | POA: Diagnosis not present

## 2019-05-24 DIAGNOSIS — R5383 Other fatigue: Secondary | ICD-10-CM | POA: Diagnosis not present

## 2019-06-01 DIAGNOSIS — G4733 Obstructive sleep apnea (adult) (pediatric): Secondary | ICD-10-CM | POA: Diagnosis not present

## 2019-06-01 DIAGNOSIS — Z711 Person with feared health complaint in whom no diagnosis is made: Secondary | ICD-10-CM | POA: Diagnosis not present

## 2019-06-03 DIAGNOSIS — Z6828 Body mass index (BMI) 28.0-28.9, adult: Secondary | ICD-10-CM | POA: Diagnosis not present

## 2019-06-03 DIAGNOSIS — J302 Other seasonal allergic rhinitis: Secondary | ICD-10-CM | POA: Diagnosis not present

## 2019-06-03 DIAGNOSIS — J449 Chronic obstructive pulmonary disease, unspecified: Secondary | ICD-10-CM | POA: Diagnosis not present

## 2019-06-03 DIAGNOSIS — K219 Gastro-esophageal reflux disease without esophagitis: Secondary | ICD-10-CM | POA: Diagnosis not present

## 2019-06-07 DIAGNOSIS — G4761 Periodic limb movement disorder: Secondary | ICD-10-CM | POA: Diagnosis not present

## 2019-06-07 DIAGNOSIS — R5383 Other fatigue: Secondary | ICD-10-CM | POA: Diagnosis not present

## 2019-06-07 DIAGNOSIS — J4540 Moderate persistent asthma, uncomplicated: Secondary | ICD-10-CM | POA: Diagnosis not present

## 2019-06-11 ENCOUNTER — Other Ambulatory Visit: Payer: Self-pay

## 2019-06-11 DIAGNOSIS — E119 Type 2 diabetes mellitus without complications: Secondary | ICD-10-CM | POA: Diagnosis not present

## 2019-06-11 DIAGNOSIS — J449 Chronic obstructive pulmonary disease, unspecified: Secondary | ICD-10-CM | POA: Diagnosis not present

## 2019-06-18 IMAGING — DX DG LUMBAR SPINE COMPLETE 4+V
5 series · 5 of 5 positions shown · non-contrast
Comparison: None.

CLINICAL DATA: Low back pain for 4 weeks.

EXAM:
LUMBAR SPINE - COMPLETE 4+ VIEW

[l-spine ap]
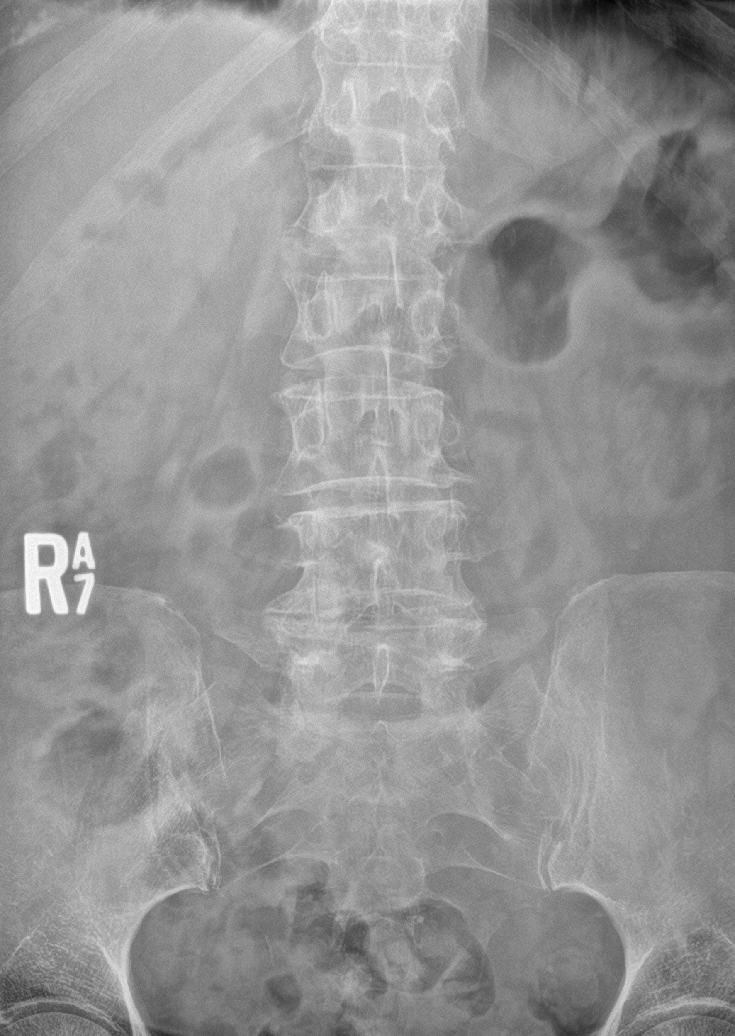

[l-spine obl (1 of 2)]
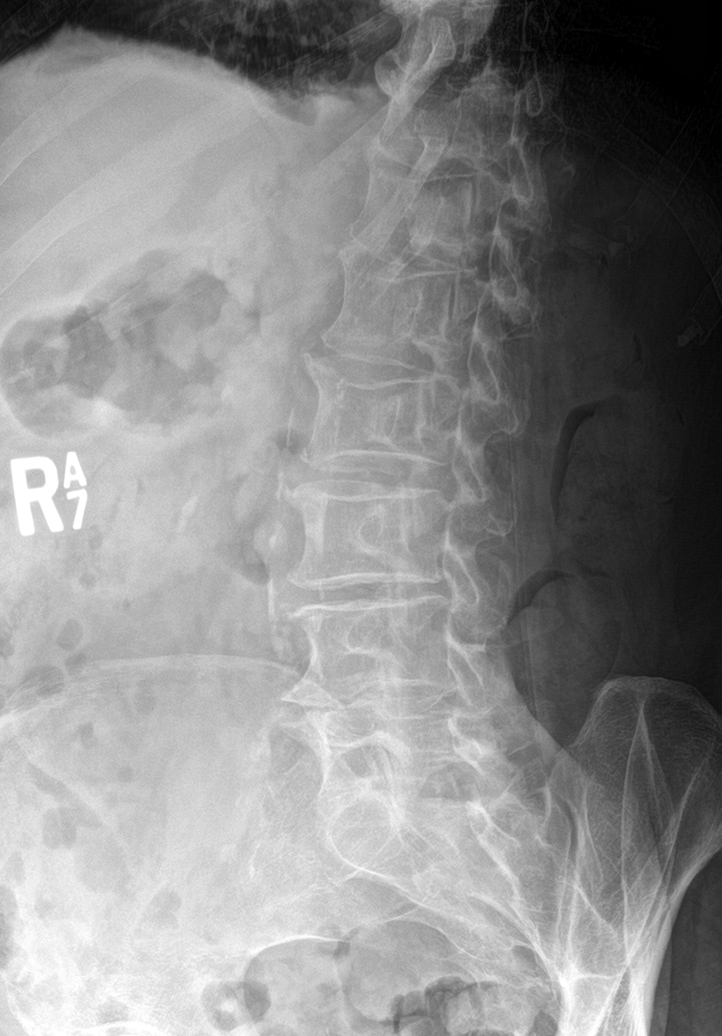

[l-spine obl (2 of 2)]
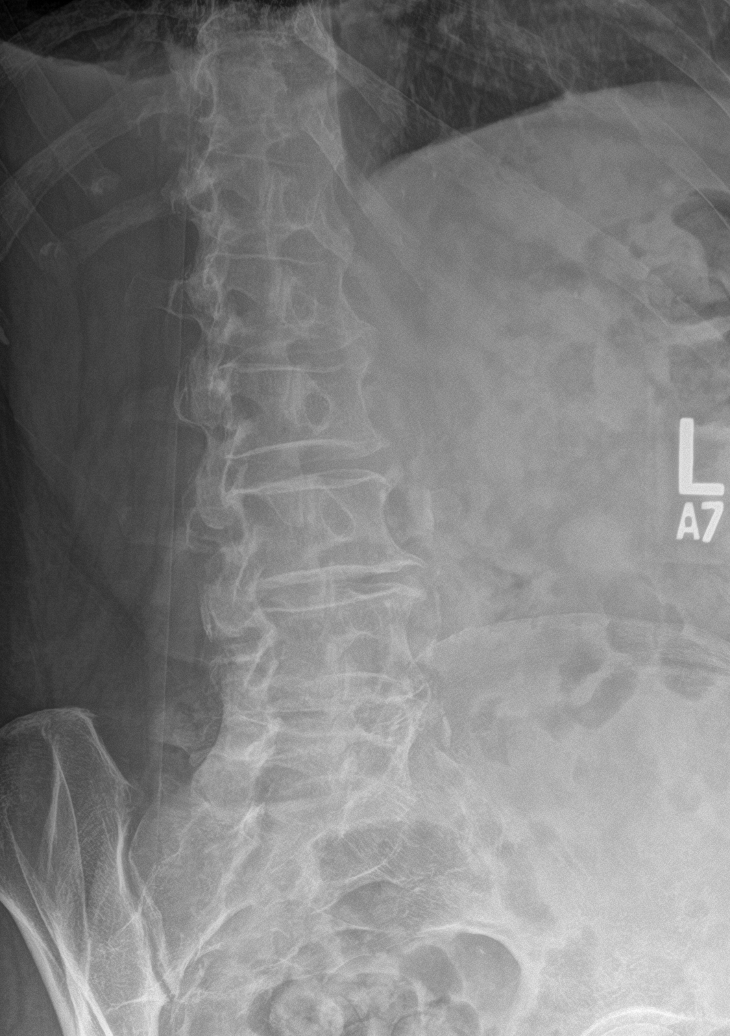

[l-spine lat]
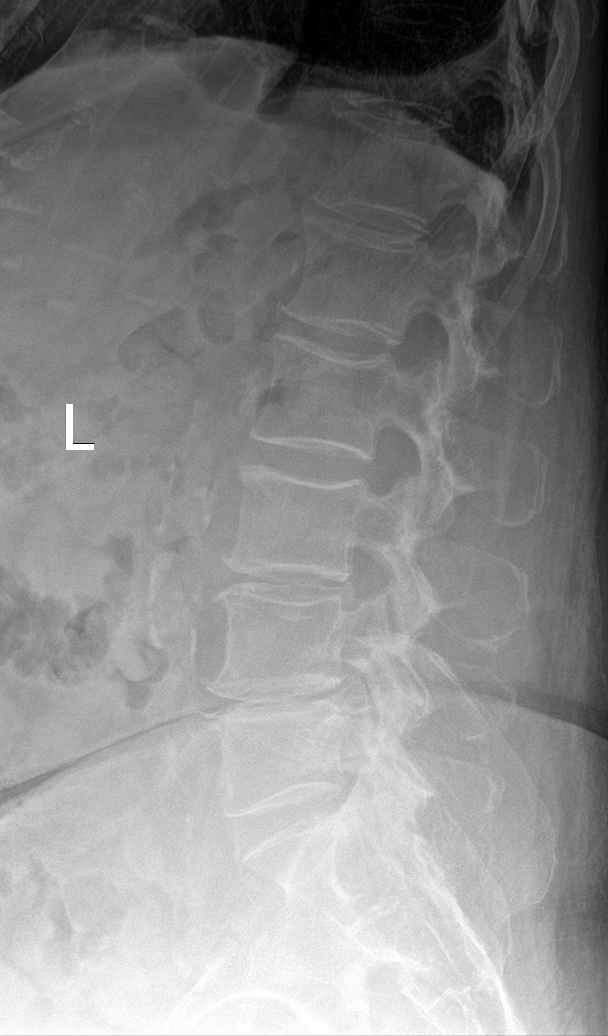

[l-spine spot]
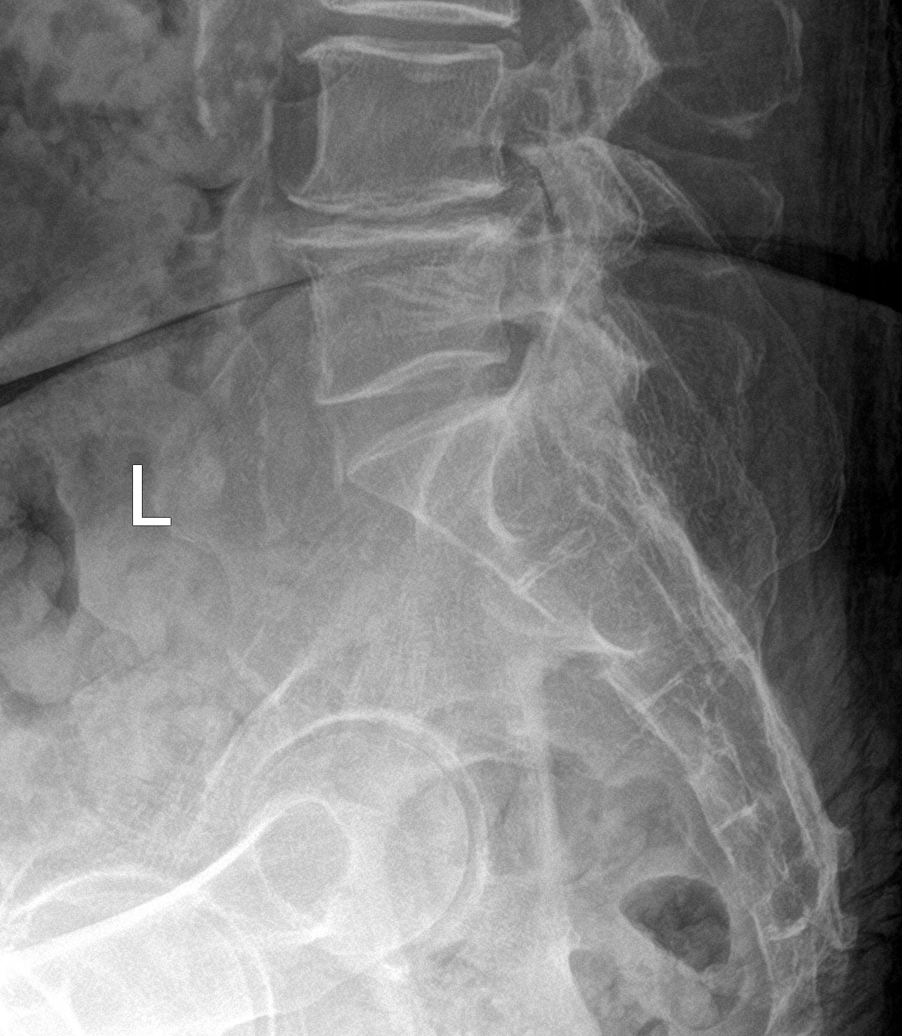

[5 of 5 positions shown; findings below may reference images not displayed]

FINDINGS: There is no evidence of lumbar spine fracture. Alignment is normal.
There are degenerative joint changes with narrowed joint space and
osteophyte formation in the mid to lower lumbar spine.
IMPRESSION: No acute fracture dislocation. Osteoarthritic changes of the mid to
lower lumbar spine more prominently involving the L4-5 level.

## 2019-06-22 ENCOUNTER — Other Ambulatory Visit: Payer: Self-pay

## 2019-06-22 ENCOUNTER — Encounter: Payer: Self-pay | Admitting: Cardiology

## 2019-06-22 ENCOUNTER — Ambulatory Visit (INDEPENDENT_AMBULATORY_CARE_PROVIDER_SITE_OTHER): Payer: PPO | Admitting: Cardiology

## 2019-06-22 VITALS — BP 112/58 | HR 103 | Ht 62.0 in | Wt 164.0 lb

## 2019-06-22 DIAGNOSIS — E088 Diabetes mellitus due to underlying condition with unspecified complications: Secondary | ICD-10-CM

## 2019-06-22 DIAGNOSIS — J431 Panlobular emphysema: Secondary | ICD-10-CM | POA: Diagnosis not present

## 2019-06-22 DIAGNOSIS — Z87891 Personal history of nicotine dependence: Secondary | ICD-10-CM

## 2019-06-22 DIAGNOSIS — R0789 Other chest pain: Secondary | ICD-10-CM

## 2019-06-22 DIAGNOSIS — I313 Pericardial effusion (noninflammatory): Secondary | ICD-10-CM | POA: Diagnosis not present

## 2019-06-22 DIAGNOSIS — R0600 Dyspnea, unspecified: Secondary | ICD-10-CM

## 2019-06-22 DIAGNOSIS — R0609 Other forms of dyspnea: Secondary | ICD-10-CM

## 2019-06-22 DIAGNOSIS — I3139 Other pericardial effusion (noninflammatory): Secondary | ICD-10-CM

## 2019-06-22 HISTORY — DX: Other forms of dyspnea: R06.09

## 2019-06-22 HISTORY — DX: Dyspnea, unspecified: R06.00

## 2019-06-22 HISTORY — DX: Diabetes mellitus due to underlying condition with unspecified complications: E08.8

## 2019-06-22 HISTORY — DX: Personal history of nicotine dependence: Z87.891

## 2019-06-22 HISTORY — DX: Other pericardial effusion (noninflammatory): I31.39

## 2019-06-22 HISTORY — DX: Pericardial effusion (noninflammatory): I31.3

## 2019-06-22 HISTORY — DX: Other chest pain: R07.89

## 2019-06-22 NOTE — Addendum Note (Signed)
Addended by: Beckey Rutter on: 06/22/2019 02:15 PM   Modules accepted: Orders

## 2019-06-22 NOTE — Progress Notes (Signed)
Cardiology Office Note:    Date:  06/22/2019   ID:  [name], DOB 08-23-1946, MRN [id number]  PCP:  Serita Grammes, MD  Cardiologist:  Jenean Lindau, MD   Referring MD: Serita Grammes, MD    ASSESSMENT:    1. Dyspnea on exertion   2. Panlobular emphysema ([place])   3. Diabetes mellitus due to underlying condition with unspecified complications ([place])   4. Ex-smoker   5. Chest discomfort   6. Pericardial effusion    PLAN:    In order of problems listed above:  1. Dyspnea on exertion: Patient symptoms are of concern.  I would like to get an echocardiogram in view of history of pericardial effusion.  This will help me assess the status of her pericardial effusion if it has gotten any worse.  I discussed this with her and she agrees.  Once this is done then I will set her up for a Lexiscan sestamibi to understand for any objective evidence of coronary artery disease especially since she has multiple risk factors 2. Diabetes mellitus: Managed by primary care physician.  Diet was discussed.  For this reason she must be on statin therapy and this is followed by primary care. 3. Patient will be seen in follow-up appointment in 4 months or earlier if she has any concerns.  She knows to go to the nearest emergency room for any concerning symptoms.   Medication Adjustments/Labs and Tests Ordered: Current medicines are reviewed at length with the patient today.  Concerns regarding medicines are outlined above.  No orders of the defined types were placed in this encounter.  No orders of the defined types were placed in this encounter.    History of Present Illness:    Pam Gamble is a 73 y.o. female who is being seen today for the evaluation of chest discomfort and dyspnea on exertion at the request of Serita Grammes, MD.  Patient is a pleasant 73 year old female.  She has past medical history of diabetes mellitus, extensive smoking in the past from which she is quit now and  COPD.  She mentions to me that she is noticing shortness of breath on exertion with this more than usual.  She has had history of pericardial effusion noted on echocardiogram sent by primary care physician this is from the past.  She also has chest discomfort at times and is not related to exertion.  No radiation to the neck or to the arms.  At the time of my evaluation, the patient is alert awake oriented and in no distress. Past Medical History:  Diagnosis Date  . Barrett's esophagus   . Calcium deposits in tendon and bursa   . Chronic airway obstruction, not elsewhere classified   . COPD (chronic obstructive pulmonary disease) ([place])   . Depression   . Diabetes mellitus 2008ish   type two  . Esophageal reflux   . Idiopathic thrombocythemia   . Kidney stones   . Lateral epicondylitis  of elbow   . Personal history of malignant neoplasm of other parts of uterus   . Personal history of other diseases of digestive system   . Shortness of breath    upon exertion    Past Surgical History:  Procedure Laterality Date  . ABDOMINAL HYSTERECTOMY    . CATARACT EXTRACTION Bilateral 01/2016 and 03/2016  . CHOLECYSTECTOMY N/A 10/03/2018   Procedure: LAPAROSCOPIC CHOLECYSTECTOMY WITH INTRAOPERATIVE CHOLANGIOGRAM;  Surgeon: Alphonsa Overall, MD;  Location: [facility];  Service: General;  Laterality:  N/A;  . LITHOTRIPSY    . REFRACTIVE SURGERY Right 05/16/2016  . SPLENECTOMY  1999  . TONSILLECTOMY AND ADENOIDECTOMY      Current Medications: Current Meds  Medication Sig  . albuterol (PROVENTIL HFA;VENTOLIN HFA) 108 (90 Base) MCG/ACT inhaler Inhale 2 puffs into the lungs every 6 (six) hours as needed for wheezing or shortness of breath.  . Blood Glucose Monitoring Suppl (ONE TOUCH ULTRA SYSTEM KIT) w/Device KIT 1 kit by Does not apply route once.  . Cetirizine HCl (ZYRTEC PO) Take 1 tablet by mouth daily.  . Cholecalciferol (VITAMIN D-3) 125 MCG (5000 UT) TABS Take 125 mcg by mouth daily.  . fluticasone  (FLONASE) 50 MCG/ACT nasal spray Place 2 sprays into the nose daily as needed for rhinitis. Reported on 01/23/2016  . Fluticasone-Umeclidin-Vilant (TRELEGY ELLIPTA) 100-62.5-25 MCG/INH AEPB Inhale 1 puff into the lungs daily.  Marland Kitchen glucose blood (ONE TOUCH ULTRA TEST) test strip USE 1 STRIP TO CHECK GLUCOSE TWICE DAILY  . HYDROcodone-acetaminophen (NORCO/VICODIN) 5-325 MG tablet Take 1-2 tablets by mouth every 4 (four) hours as needed for moderate pain.  Marland Kitchen insulin glargine (LANTUS) 100 unit/mL SOPN Inject 35 Units into the skin daily.  Marland Kitchen ipratropium-albuterol (DUONEB) 0.5-2.5 (3) MG/3ML SOLN Inhale 3 mLs into the lungs as needed.  . metFORMIN (GLUCOPHAGE) 1000 MG tablet TAKE ONE TABLET BY MOUTH TWICE DAILY WITH MEALS (Patient taking differently: Take 1,000 mg by mouth 2 (two) times daily with a meal. )  . montelukast (SINGULAIR) 10 MG tablet Take 10 mg by mouth daily.  . naproxen sodium (ALEVE) 220 MG tablet Take 440 mg by mouth as needed (pain/headache).  Marland Kitchen omeprazole (PRILOSEC) 40 MG capsule Take 1 capsule (40 mg total) by mouth daily.  . ONE TOUCH LANCETS MISC 1 each by Does not apply route 2 (two) times daily.  Marland Kitchen OVER THE COUNTER MEDICATION Apply 1 application topically as needed (pain).  Vladimir Faster Glycol-Propyl Glycol (SYSTANE) 0.4-0.3 % GEL ophthalmic gel Place 1-2 application into both eyes as needed (dry eyes).   Current Facility-Administered Medications for the 06/22/19 encounter (Office Visit) with Revankar, Reita Cliche, MD  Medication  . ipratropium-albuterol (DUONEB) 0.5-2.5 (3) MG/3ML nebulizer solution 3 mL     Allergies:   Amoxicillin-pot clavulanate, Ertugliflozin, and Levaquin [levofloxacin]   Social History   Socioeconomic History  . Marital status: Single    Spouse name: Not on file  . Number of children: 0  . Years of education: Not on file  . Highest education level: Not on file  Occupational History  . Occupation: retired    Fish farm manager: Aeronautical engineer  Social Needs   . Financial resource strain: Not on file  . Food insecurity    Worry: Not on file    Inability: Not on file  . Transportation needs    Medical: Not on file    Non-medical: Not on file  Tobacco Use  . Smoking status: Former Smoker    Packs/day: 2.00    Years: 49.00    Pack years: 98.00    Types: Cigarettes    Quit date: 11/11/2005    Years since quitting: 13.6  . Smokeless tobacco: Never Used  Substance and Sexual Activity  . Alcohol use: Yes    Alcohol/week: 0.0 standard drinks    Comment: beer, whiskey, margherita  . Drug use: No  . Sexual activity: Not Currently  Lifestyle  . Physical activity    Days per week: Not on file    Minutes per session: Not  on file  . Stress: Not on file  Relationships  . Social Herbalist on phone: Not on file    Gets together: Not on file    Attends religious service: Not on file    Active member of club or organization: Not on file    Attends meetings of clubs or organizations: Not on file    Relationship status: Not on file  Other Topics Concern  . Not on file  Social History Narrative   Retired - Chief Operating Officer   Lives alone   Single     Family History: The patient's family history includes Diabetes in her father, maternal aunt, and sister; Heart disease in her father and mother; Liver cancer in her sister; Pancreatic cancer in her sister; Stroke in her mother.  ROS:   Please see the history of present illness.    All other systems reviewed and are negative.  EKGs/Labs/Other Studies Reviewed:    The following studies were reviewed today: EKG reveals sinus rhythm and nonspecific ST-T changes.  Records were reviewed from primary care physician and echocardiogram has revealed pericardial effusion in the past.   Recent Labs: 10/04/2018: ALT 109; BUN 13; Creatinine, Ser 1.00; Hemoglobin 9.7; Platelets 413; Potassium 4.2; Sodium 137  Recent Lipid Panel    Component Value Date/Time   CHOL 228 (H) 12/12/2016  0853   TRIG 114.0 12/12/2016 0853   HDL 52.60 12/12/2016 0853   CHOLHDL 4 12/12/2016 0853   VLDL 22.8 12/12/2016 0853   LDLCALC 153 (H) 12/12/2016 0853   LDLDIRECT 160.1 07/12/2014 1423    Physical Exam:    VS:  BP (!) 112/58 (BP Location: Right Arm, Patient Position: Sitting)   Pulse (!) 103   Ht '5\' 2"'  (1.575 m)   Wt 164 lb (74.4 kg)   SpO2 96%   BMI 30.00 kg/m     Wt Readings from Last 3 Encounters:  06/22/19 164 lb (74.4 kg)  10/04/18 161 lb 2.5 oz (73.1 kg)  02/09/18 166 lb 12.8 oz (75.7 kg)     GEN: Patient is in no acute distress HEENT: Normal NECK: No JVD; No carotid bruits LYMPHATICS: No lymphadenopathy CARDIAC: S1 S2 regular, 2/6 systolic murmur at the apex. RESPIRATORY:  Clear to auscultation without rales, wheezing or rhonchi  ABDOMEN: Soft, non-tender, non-distended MUSCULOSKELETAL:  No edema; No deformity  SKIN: Warm and dry NEUROLOGIC:  Alert and oriented x 3 PSYCHIATRIC:  Normal affect    Signed, Jenean Lindau, MD  06/22/2019 1:42 PM    [facility]

## 2019-06-22 NOTE — Patient Instructions (Signed)
Medication Instructions:  Your physician recommends that you continue on your current medications as directed. Please refer to the Current Medication list given to you today.  If you need a refill on your cardiac medications before your next appointment, please call your pharmacy.   Lab work: NONE If you have labs (blood work) drawn today and your tests are completely normal, you will receive your results only by: . MyChart Message (if you have MyChart) OR . A paper copy in the mail If you have any lab test that is abnormal or we need to change your treatment, we will call you to review the results.  Testing/Procedures: You had an EKG performed today.  Your physician has requested that you have an echocardiogram. Echocardiography is a painless test that uses sound waves to create images of your heart. It provides your doctor with information about the size and shape of your heart and how well your heart's chambers and valves are working. This procedure takes approximately one hour. There are no restrictions for this procedure.  Your physician has requested that you have a lexiscan myoview. For further information please visit [email or web address]. Please follow instruction sheet, as given.    Follow-Up: At [facility], you and your health needs are our priority.  As part of our continuing mission to provide you with exceptional heart care, we have created designated Provider Care Teams.  These Care Teams include your primary Cardiologist (physician) and Advanced Practice Providers (APPs -  Physician Assistants and Nurse Practitioners) who all work together to provide you with the care you need, when you need it. You will need a follow up appointment in 4 months.   Any Other Special Instructions Will Be Listed Below  Regadenoson injection What is this medicine? REGADENOSON is used to test the heart for coronary artery disease. It is used in patients who can not exercise for their stress  test. This medicine may be used for other purposes; ask your health care provider or pharmacist if you have questions. COMMON BRAND NAME(S): Lexiscan What should I tell my health care provider before I take this medicine? They need to know if you have any of these conditions:  heart problems  lung or breathing disease, like asthma or COPD  an unusual or allergic reaction to regadenoson, other medicines, foods, dyes, or preservatives  pregnant or trying to get pregnant  breast-feeding How should I use this medicine? This medicine is for injection into a vein. It is given by a health care professional in a hospital or clinic setting. Talk to your pediatrician regarding the use of this medicine in children. Special care may be needed. Overdosage: If you think you have taken too much of this medicine contact a poison control center or emergency room at once. NOTE: This medicine is only for you. Do not share this medicine with others. What if I miss a dose? This does not apply. What may interact with this medicine?  caffeine  dipyridamole  guarana  theophylline This list may not describe all possible interactions. Give your health care provider a list of all the medicines, herbs, non-prescription drugs, or dietary supplements you use. Also tell them if you smoke, drink alcohol, or use illegal drugs. Some items may interact with your medicine. What should I watch for while using this medicine? Your condition will be monitored carefully while you are receiving this medicine. Do not take medicines, foods, or drinks with caffeine (like coffee, tea, or colas) for at least 12 hours   before your test. If you do not know if something contains caffeine, ask your health care professional. What side effects may I notice from receiving this medicine? Side effects that you should report to your doctor or health care professional as soon as possible:  allergic reactions like skin rash, itching or  hives, swelling of the face, lips, or tongue  breathing problems  chest pain, tightness or palpitations  severe headache Side effects that usually do not require medical attention (report to your doctor or health care professional if they continue or are bothersome):  flushing  headache  irritation or pain at site where injected  nausea, vomiting This list may not describe all possible side effects. Call your doctor for medical advice about side effects. You may report side effects to FDA at 1-800-FDA-1088. Where should I keep my medicine? This drug is given in a hospital or clinic and will not be stored at home. NOTE: This sheet is a summary. It may not cover all possible information. If you have questions about this medicine, talk to your doctor, pharmacist, or health care provider.  2020 Elsevier/Gold Standard (2008-06-27 15:08:13)  Cardiac Nuclear Scan A cardiac nuclear scan is a test that is done to check the flow of blood to your heart. It is done when you are resting and when you are exercising. The test looks for problems such as:  Not enough blood reaching a portion of the heart.  The heart muscle not working as it should. You may need this test if:  You have heart disease.  You have had lab results that are not normal.  You have had heart surgery or a balloon procedure to open up blocked arteries (angioplasty).  You have chest pain.  You have shortness of breath. In this test, a special dye (tracer) is put into your bloodstream. The tracer will travel to your heart. A camera will then take pictures of your heart to see how the tracer moves through your heart. This test is usually done at a hospital and takes 2-4 hours. Tell a doctor about:  Any allergies you have.  All medicines you are taking, including vitamins, herbs, eye drops, creams, and over-the-counter medicines.  Any problems you or family members have had with anesthetic medicines.  Any blood  disorders you have.  Any surgeries you have had.  Any medical conditions you have.  Whether you are pregnant or may be pregnant. What are the risks? Generally, this is a safe test. However, problems may occur, such as:  Serious chest pain and heart attack. This is only a risk if the stress portion of the test is done.  Rapid heartbeat.  A feeling of warmth in your chest. This feeling usually does not last long.  Allergic reaction to the tracer. What happens before the test?  Ask your doctor about changing or stopping your normal medicines. This is important.  Follow instructions from your doctor about what you cannot eat or drink.  Remove your jewelry on the day of the test. What happens during the test?  An IV tube will be inserted into one of your veins.  Your doctor will give you a small amount of tracer through the IV tube.  You will wait for 20-40 minutes while the tracer moves through your bloodstream.  Your heart will be monitored with an electrocardiogram (ECG).  You will lie down on an exam table.  Pictures of your heart will be taken for about 15-20 minutes.  You may   also have a stress test. For this test, one of these things may be done: ? You will be asked to exercise on a treadmill or a stationary bike. ? You will be given medicines that will make your heart work harder. This is done if you are unable to exercise.  When blood flow to your heart has peaked, a tracer will again be given through the IV tube.  After 20-40 minutes, you will get back on the exam table. More pictures will be taken of your heart.  Depending on the tracer that is used, more pictures may need to be taken 3-4 hours later.  Your IV tube will be removed when the test is over. The test may vary among doctors and hospitals. What happens after the test?  Ask your doctor: ? Whether you can return to your normal schedule, including diet, activities, and medicines. ? Whether you should  drink more fluids. This will help to remove the tracer from your body. Drink enough fluid to keep your pee (urine) pale yellow.  Ask your doctor, or the department that is doing the test: ? When will my results be ready? ? How will I get my results? Summary  A cardiac nuclear scan is a test that is done to check the flow of blood to your heart.  Tell your doctor whether you are pregnant or may be pregnant.  Before the test, ask your doctor about changing or stopping your normal medicines. This is important.  Ask your doctor whether you can return to your normal activities. You may be asked to drink more fluids. This information is not intended to replace advice given to you by your health care provider. Make sure you discuss any questions you have with your health care provider. Document Released: 04/13/2018 Document Revised: 02/17/2019 Document Reviewed: 04/13/2018 Elsevier Patient Education  2020 Elsevier Inc.  Echocardiogram An echocardiogram is a procedure that uses painless sound waves (ultrasound) to produce an image of the heart. Images from an echocardiogram can provide important information about:  Signs of coronary artery disease (CAD).  Aneurysm detection. An aneurysm is a weak or damaged part of an artery wall that bulges out from the normal force of blood pumping through the body.  Heart size and shape. Changes in the size or shape of the heart can be associated with certain conditions, including heart failure, aneurysm, and CAD.  Heart muscle function.  Heart valve function.  Signs of a past heart attack.  Fluid buildup around the heart.  Thickening of the heart muscle.  A tumor or infectious growth around the heart valves. Tell a health care provider about:  Any allergies you have.  All medicines you are taking, including vitamins, herbs, eye drops, creams, and over-the-counter medicines.  Any blood disorders you have.  Any surgeries you have had.  Any  medical conditions you have.  Whether you are pregnant or may be pregnant. What are the risks? Generally, this is a safe procedure. However, problems may occur, including:  Allergic reaction to dye (contrast) that may be used during the procedure. What happens before the procedure? No specific preparation is needed. You may eat and drink normally. What happens during the procedure?   An IV tube may be inserted into one of your veins.  You may receive contrast through this tube. A contrast is an injection that improves the quality of the pictures from your heart.  A gel will be applied to your chest.  A wand-like tool (transducer)   will be moved over your chest. The gel will help to transmit the sound waves from the transducer.  The sound waves will harmlessly bounce off of your heart to allow the heart images to be captured in real-time motion. The images will be recorded on a computer. The procedure may vary among health care providers and hospitals. What happens after the procedure?  You may return to your normal, everyday life, including diet, activities, and medicines, unless your health care provider tells you not to do that. Summary  An echocardiogram is a procedure that uses painless sound waves (ultrasound) to produce an image of the heart.  Images from an echocardiogram can provide important information about the size and shape of your heart, heart muscle function, heart valve function, and fluid buildup around your heart.  You do not need to do anything to prepare before this procedure. You may eat and drink normally.  After the echocardiogram is completed, you may return to your normal, everyday life, unless your health care provider tells you not to do that. This information is not intended to replace advice given to you by your health care provider. Make sure you discuss any questions you have with your health care provider. Document Released: 10/25/2000 Document  Revised: 02/18/2019 Document Reviewed: 11/30/2016 Elsevier Patient Education  2020 Elsevier Inc.  

## 2019-07-09 ENCOUNTER — Other Ambulatory Visit: Payer: Self-pay

## 2019-07-09 ENCOUNTER — Ambulatory Visit (INDEPENDENT_AMBULATORY_CARE_PROVIDER_SITE_OTHER): Payer: PPO

## 2019-07-09 DIAGNOSIS — R0609 Other forms of dyspnea: Secondary | ICD-10-CM

## 2019-07-09 DIAGNOSIS — R0789 Other chest pain: Secondary | ICD-10-CM

## 2019-07-09 NOTE — Progress Notes (Signed)
Complete echocardiogram has been performed.  Jimmy Ivy Meriwether RDCS, RVT 

## 2019-07-12 DIAGNOSIS — E119 Type 2 diabetes mellitus without complications: Secondary | ICD-10-CM | POA: Diagnosis not present

## 2019-07-12 DIAGNOSIS — J449 Chronic obstructive pulmonary disease, unspecified: Secondary | ICD-10-CM | POA: Diagnosis not present

## 2019-07-13 DIAGNOSIS — G4761 Periodic limb movement disorder: Secondary | ICD-10-CM | POA: Diagnosis not present

## 2019-07-13 DIAGNOSIS — R05 Cough: Secondary | ICD-10-CM | POA: Diagnosis not present

## 2019-07-13 DIAGNOSIS — J4540 Moderate persistent asthma, uncomplicated: Secondary | ICD-10-CM | POA: Diagnosis not present

## 2019-07-13 DIAGNOSIS — R5383 Other fatigue: Secondary | ICD-10-CM | POA: Diagnosis not present

## 2019-07-14 ENCOUNTER — Telehealth: Payer: Self-pay | Admitting: Cardiology

## 2019-07-14 NOTE — Telephone Encounter (Signed)
Please call patient with results of echocardiogram.

## 2019-07-15 ENCOUNTER — Telehealth: Payer: Self-pay

## 2019-07-15 NOTE — Telephone Encounter (Signed)
-----   Message from Jenean Lindau, MD sent at 07/11/2019  8:16 PM EDT ----- Pl give her appt to see me after stress test not four months ----- Message ----- From: Interface, Three One Seven Sent: 07/09/2019  12:43 PM EDT To: Jenean Lindau, MD

## 2019-07-15 NOTE — Telephone Encounter (Signed)
Results relayed, copy to Dr. Jeryl Columbia per Dr. Docia Furl request. Patient f/u rescheduled to Oct 14, no further questions.

## 2019-07-20 DIAGNOSIS — R509 Fever, unspecified: Secondary | ICD-10-CM | POA: Diagnosis not present

## 2019-07-20 DIAGNOSIS — B9689 Other specified bacterial agents as the cause of diseases classified elsewhere: Secondary | ICD-10-CM | POA: Diagnosis not present

## 2019-07-20 DIAGNOSIS — R1110 Vomiting, unspecified: Secondary | ICD-10-CM | POA: Diagnosis not present

## 2019-07-20 DIAGNOSIS — N201 Calculus of ureter: Secondary | ICD-10-CM | POA: Diagnosis not present

## 2019-07-20 DIAGNOSIS — N3001 Acute cystitis with hematuria: Secondary | ICD-10-CM | POA: Diagnosis not present

## 2019-07-20 DIAGNOSIS — A084 Viral intestinal infection, unspecified: Secondary | ICD-10-CM | POA: Diagnosis not present

## 2019-07-20 DIAGNOSIS — R0602 Shortness of breath: Secondary | ICD-10-CM | POA: Diagnosis not present

## 2019-07-20 DIAGNOSIS — J449 Chronic obstructive pulmonary disease, unspecified: Secondary | ICD-10-CM | POA: Diagnosis not present

## 2019-07-20 DIAGNOSIS — Z03818 Encounter for observation for suspected exposure to other biological agents ruled out: Secondary | ICD-10-CM | POA: Diagnosis not present

## 2019-07-20 DIAGNOSIS — R112 Nausea with vomiting, unspecified: Secondary | ICD-10-CM | POA: Diagnosis not present

## 2019-07-21 DIAGNOSIS — K219 Gastro-esophageal reflux disease without esophagitis: Secondary | ICD-10-CM | POA: Diagnosis not present

## 2019-07-21 DIAGNOSIS — R1032 Left lower quadrant pain: Secondary | ICD-10-CM | POA: Diagnosis not present

## 2019-07-21 DIAGNOSIS — E1122 Type 2 diabetes mellitus with diabetic chronic kidney disease: Secondary | ICD-10-CM | POA: Diagnosis not present

## 2019-07-21 DIAGNOSIS — J449 Chronic obstructive pulmonary disease, unspecified: Secondary | ICD-10-CM | POA: Diagnosis not present

## 2019-07-21 DIAGNOSIS — J90 Pleural effusion, not elsewhere classified: Secondary | ICD-10-CM | POA: Diagnosis not present

## 2019-07-21 DIAGNOSIS — N132 Hydronephrosis with renal and ureteral calculous obstruction: Secondary | ICD-10-CM | POA: Diagnosis not present

## 2019-07-21 DIAGNOSIS — R109 Unspecified abdominal pain: Secondary | ICD-10-CM | POA: Diagnosis not present

## 2019-07-21 DIAGNOSIS — A4151 Sepsis due to Escherichia coli [E. coli]: Secondary | ICD-10-CM | POA: Diagnosis not present

## 2019-07-21 DIAGNOSIS — N136 Pyonephrosis: Secondary | ICD-10-CM | POA: Diagnosis not present

## 2019-07-21 DIAGNOSIS — Z23 Encounter for immunization: Secondary | ICD-10-CM | POA: Diagnosis not present

## 2019-07-21 DIAGNOSIS — N209 Urinary calculus, unspecified: Secondary | ICD-10-CM | POA: Diagnosis not present

## 2019-07-21 DIAGNOSIS — G2581 Restless legs syndrome: Secondary | ICD-10-CM | POA: Diagnosis not present

## 2019-07-21 DIAGNOSIS — Z794 Long term (current) use of insulin: Secondary | ICD-10-CM | POA: Diagnosis not present

## 2019-07-21 DIAGNOSIS — N179 Acute kidney failure, unspecified: Secondary | ICD-10-CM | POA: Diagnosis not present

## 2019-07-21 DIAGNOSIS — E1165 Type 2 diabetes mellitus with hyperglycemia: Secondary | ICD-10-CM | POA: Diagnosis not present

## 2019-07-21 DIAGNOSIS — N12 Tubulo-interstitial nephritis, not specified as acute or chronic: Secondary | ICD-10-CM | POA: Diagnosis not present

## 2019-07-21 DIAGNOSIS — N182 Chronic kidney disease, stage 2 (mild): Secondary | ICD-10-CM | POA: Diagnosis not present

## 2019-07-21 DIAGNOSIS — Z79891 Long term (current) use of opiate analgesic: Secondary | ICD-10-CM | POA: Diagnosis not present

## 2019-07-21 DIAGNOSIS — R7881 Bacteremia: Secondary | ICD-10-CM | POA: Diagnosis not present

## 2019-07-21 DIAGNOSIS — Z466 Encounter for fitting and adjustment of urinary device: Secondary | ICD-10-CM | POA: Diagnosis not present

## 2019-07-21 DIAGNOSIS — I129 Hypertensive chronic kidney disease with stage 1 through stage 4 chronic kidney disease, or unspecified chronic kidney disease: Secondary | ICD-10-CM | POA: Diagnosis not present

## 2019-07-21 DIAGNOSIS — N201 Calculus of ureter: Secondary | ICD-10-CM | POA: Diagnosis not present

## 2019-07-21 DIAGNOSIS — Z87891 Personal history of nicotine dependence: Secondary | ICD-10-CM | POA: Diagnosis not present

## 2019-07-21 DIAGNOSIS — D649 Anemia, unspecified: Secondary | ICD-10-CM | POA: Diagnosis not present

## 2019-07-21 DIAGNOSIS — Z7984 Long term (current) use of oral hypoglycemic drugs: Secondary | ICD-10-CM | POA: Diagnosis not present

## 2019-07-21 DIAGNOSIS — F17210 Nicotine dependence, cigarettes, uncomplicated: Secondary | ICD-10-CM | POA: Diagnosis not present

## 2019-07-21 DIAGNOSIS — N200 Calculus of kidney: Secondary | ICD-10-CM | POA: Diagnosis not present

## 2019-07-22 DIAGNOSIS — N12 Tubulo-interstitial nephritis, not specified as acute or chronic: Secondary | ICD-10-CM | POA: Diagnosis not present

## 2019-07-22 DIAGNOSIS — E1165 Type 2 diabetes mellitus with hyperglycemia: Secondary | ICD-10-CM | POA: Diagnosis not present

## 2019-07-22 DIAGNOSIS — R7881 Bacteremia: Secondary | ICD-10-CM | POA: Diagnosis not present

## 2019-07-22 DIAGNOSIS — N132 Hydronephrosis with renal and ureteral calculous obstruction: Secondary | ICD-10-CM | POA: Diagnosis not present

## 2019-07-23 DIAGNOSIS — N12 Tubulo-interstitial nephritis, not specified as acute or chronic: Secondary | ICD-10-CM | POA: Diagnosis not present

## 2019-07-23 DIAGNOSIS — E1165 Type 2 diabetes mellitus with hyperglycemia: Secondary | ICD-10-CM | POA: Diagnosis not present

## 2019-07-23 DIAGNOSIS — N132 Hydronephrosis with renal and ureteral calculous obstruction: Secondary | ICD-10-CM | POA: Diagnosis not present

## 2019-07-23 DIAGNOSIS — R7881 Bacteremia: Secondary | ICD-10-CM | POA: Diagnosis not present

## 2019-07-24 DIAGNOSIS — R7881 Bacteremia: Secondary | ICD-10-CM | POA: Diagnosis not present

## 2019-07-24 DIAGNOSIS — N12 Tubulo-interstitial nephritis, not specified as acute or chronic: Secondary | ICD-10-CM | POA: Diagnosis not present

## 2019-07-24 DIAGNOSIS — E1165 Type 2 diabetes mellitus with hyperglycemia: Secondary | ICD-10-CM | POA: Diagnosis not present

## 2019-07-24 DIAGNOSIS — N132 Hydronephrosis with renal and ureteral calculous obstruction: Secondary | ICD-10-CM | POA: Diagnosis not present

## 2019-07-25 DIAGNOSIS — N12 Tubulo-interstitial nephritis, not specified as acute or chronic: Secondary | ICD-10-CM | POA: Diagnosis not present

## 2019-07-25 DIAGNOSIS — E1165 Type 2 diabetes mellitus with hyperglycemia: Secondary | ICD-10-CM | POA: Diagnosis not present

## 2019-07-25 DIAGNOSIS — N132 Hydronephrosis with renal and ureteral calculous obstruction: Secondary | ICD-10-CM | POA: Diagnosis not present

## 2019-07-25 DIAGNOSIS — R7881 Bacteremia: Secondary | ICD-10-CM | POA: Diagnosis not present

## 2019-07-26 DIAGNOSIS — N132 Hydronephrosis with renal and ureteral calculous obstruction: Secondary | ICD-10-CM | POA: Diagnosis not present

## 2019-07-26 DIAGNOSIS — E1165 Type 2 diabetes mellitus with hyperglycemia: Secondary | ICD-10-CM | POA: Diagnosis not present

## 2019-07-26 DIAGNOSIS — R7881 Bacteremia: Secondary | ICD-10-CM | POA: Diagnosis not present

## 2019-07-26 DIAGNOSIS — N12 Tubulo-interstitial nephritis, not specified as acute or chronic: Secondary | ICD-10-CM | POA: Diagnosis not present

## 2019-07-29 DIAGNOSIS — I5030 Unspecified diastolic (congestive) heart failure: Secondary | ICD-10-CM | POA: Diagnosis not present

## 2019-07-29 DIAGNOSIS — G2581 Restless legs syndrome: Secondary | ICD-10-CM | POA: Diagnosis not present

## 2019-07-29 DIAGNOSIS — B379 Candidiasis, unspecified: Secondary | ICD-10-CM | POA: Diagnosis not present

## 2019-07-29 DIAGNOSIS — A419 Sepsis, unspecified organism: Secondary | ICD-10-CM | POA: Diagnosis not present

## 2019-07-29 DIAGNOSIS — N390 Urinary tract infection, site not specified: Secondary | ICD-10-CM | POA: Diagnosis not present

## 2019-07-29 DIAGNOSIS — B9620 Unspecified Escherichia coli [E. coli] as the cause of diseases classified elsewhere: Secondary | ICD-10-CM | POA: Diagnosis not present

## 2019-07-29 DIAGNOSIS — J449 Chronic obstructive pulmonary disease, unspecified: Secondary | ICD-10-CM | POA: Diagnosis not present

## 2019-07-29 DIAGNOSIS — Z6829 Body mass index (BMI) 29.0-29.9, adult: Secondary | ICD-10-CM | POA: Diagnosis not present

## 2019-07-29 DIAGNOSIS — D649 Anemia, unspecified: Secondary | ICD-10-CM | POA: Diagnosis not present

## 2019-07-29 DIAGNOSIS — G253 Myoclonus: Secondary | ICD-10-CM | POA: Diagnosis not present

## 2019-07-30 DIAGNOSIS — I5030 Unspecified diastolic (congestive) heart failure: Secondary | ICD-10-CM | POA: Diagnosis not present

## 2019-07-30 DIAGNOSIS — Z6829 Body mass index (BMI) 29.0-29.9, adult: Secondary | ICD-10-CM | POA: Diagnosis not present

## 2019-07-30 DIAGNOSIS — N390 Urinary tract infection, site not specified: Secondary | ICD-10-CM | POA: Diagnosis not present

## 2019-07-30 DIAGNOSIS — N183 Chronic kidney disease, stage 3 (moderate): Secondary | ICD-10-CM | POA: Diagnosis not present

## 2019-07-30 DIAGNOSIS — A419 Sepsis, unspecified organism: Secondary | ICD-10-CM | POA: Diagnosis not present

## 2019-07-30 DIAGNOSIS — B9620 Unspecified Escherichia coli [E. coli] as the cause of diseases classified elsewhere: Secondary | ICD-10-CM | POA: Diagnosis not present

## 2019-08-03 DIAGNOSIS — B9620 Unspecified Escherichia coli [E. coli] as the cause of diseases classified elsewhere: Secondary | ICD-10-CM | POA: Diagnosis not present

## 2019-08-03 DIAGNOSIS — R1032 Left lower quadrant pain: Secondary | ICD-10-CM | POA: Diagnosis not present

## 2019-08-03 DIAGNOSIS — N390 Urinary tract infection, site not specified: Secondary | ICD-10-CM | POA: Diagnosis not present

## 2019-08-03 DIAGNOSIS — I5030 Unspecified diastolic (congestive) heart failure: Secondary | ICD-10-CM | POA: Diagnosis not present

## 2019-08-03 DIAGNOSIS — Z6828 Body mass index (BMI) 28.0-28.9, adult: Secondary | ICD-10-CM | POA: Diagnosis not present

## 2019-08-03 DIAGNOSIS — N183 Chronic kidney disease, stage 3 (moderate): Secondary | ICD-10-CM | POA: Diagnosis not present

## 2019-08-03 DIAGNOSIS — N200 Calculus of kidney: Secondary | ICD-10-CM | POA: Diagnosis not present

## 2019-08-04 ENCOUNTER — Telehealth: Payer: Self-pay | Admitting: *Deleted

## 2019-08-04 ENCOUNTER — Encounter (HOSPITAL_COMMUNITY): Payer: Self-pay | Admitting: *Deleted

## 2019-08-04 ENCOUNTER — Telehealth (HOSPITAL_COMMUNITY): Payer: Self-pay | Admitting: *Deleted

## 2019-08-04 DIAGNOSIS — E785 Hyperlipidemia, unspecified: Secondary | ICD-10-CM | POA: Diagnosis not present

## 2019-08-04 DIAGNOSIS — E119 Type 2 diabetes mellitus without complications: Secondary | ICD-10-CM | POA: Diagnosis not present

## 2019-08-04 DIAGNOSIS — Z79899 Other long term (current) drug therapy: Secondary | ICD-10-CM | POA: Diagnosis not present

## 2019-08-04 DIAGNOSIS — Z794 Long term (current) use of insulin: Secondary | ICD-10-CM | POA: Diagnosis not present

## 2019-08-04 NOTE — Telephone Encounter (Signed)
Patient given detailed instructions per Myocardial Perfusion Study Information Sheet for the test on 08/10/19 Patient notified to arrive 15 minutes early and that it is imperative to arrive on time for appointment to keep from having the test rescheduled.  If you need to cancel or reschedule your appointment, please call the office within 24 hours of your appointment. . Patient verbalized understanding. Kirstie Peri

## 2019-08-04 NOTE — Telephone Encounter (Signed)
Left message on voicemail in reference to upcoming appointment scheduled for 08/10/19. Phone number given for a call back so details instructions can be given. Kirstie Peri

## 2019-08-04 NOTE — Telephone Encounter (Signed)
Follow Up:     Pt is returning your call, concerning her instructions for her test.

## 2019-08-05 ENCOUNTER — Ambulatory Visit (INDEPENDENT_AMBULATORY_CARE_PROVIDER_SITE_OTHER): Payer: PPO | Admitting: Cardiology

## 2019-08-05 ENCOUNTER — Encounter: Payer: Self-pay | Admitting: Cardiology

## 2019-08-05 ENCOUNTER — Other Ambulatory Visit: Payer: Self-pay

## 2019-08-05 ENCOUNTER — Telehealth: Payer: Self-pay | Admitting: Cardiology

## 2019-08-05 VITALS — BP 130/72 | HR 87 | Ht 62.0 in | Wt 159.0 lb

## 2019-08-05 DIAGNOSIS — I3139 Other pericardial effusion (noninflammatory): Secondary | ICD-10-CM

## 2019-08-05 DIAGNOSIS — I313 Pericardial effusion (noninflammatory): Secondary | ICD-10-CM | POA: Diagnosis not present

## 2019-08-05 DIAGNOSIS — E088 Diabetes mellitus due to underlying condition with unspecified complications: Secondary | ICD-10-CM

## 2019-08-05 NOTE — Telephone Encounter (Signed)
Left message informing patient that it is OK to have lexi scan performed.

## 2019-08-05 NOTE — Progress Notes (Signed)
Cardiology Office Note:    Date:  08/05/2019   ID:  Pam Gamble, DOB 1946-06-30, MRN [id number]  PCP:  Serita Grammes, MD  Cardiologist:  Jenean Lindau, MD   Referring MD: Serita Grammes, MD    ASSESSMENT:    1. Pericardial effusion   2. Diabetes mellitus due to underlying condition with unspecified complications ([place])    PLAN:    In order of problems listed above:  1. Pericardial effusion: This appears to be stable at this time patient is asymptomatic from this standpoint and I do not see the need for any invasive evaluation.  Echocardiogram report was discussed with the patient at length.\ 2. Essential hypertension: Blood pressure stable 3. Mixed dyslipidemia: Diet was discussed.  She is on statin therapy. 4. Next she is due for a stress test.  I discussed this with my nurse and we will try to get report of her last hemoglobin.  I would like to see it to be at least 9 before I stress her.  She will be seen in follow-up appointment in 3 months or earlier if she has any concerns.  She knows to go to the nearest emergency room for any significant concerns.   Medication Adjustments/Labs and Tests Ordered: Current medicines are reviewed at length with the patient today.  Concerns regarding medicines are outlined above.  No orders of the defined types were placed in this encounter.  No orders of the defined types were placed in this encounter.    Chief Complaint  Patient presents with  . Hospitalization Follow-up     History of Present Illness:    Pam Gamble is a 73 y.o. female.  Patient has past medical history of pericardial effusion, essential hypertension and diabetes mellitus.  She was recently in the hospital for kidney stones.  I noted her to be significantly anemic.  No chest pain orthopnea or PND.  She is getting better from a urinary tract infection for which she was admitted.  At the time of my evaluation, the patient is alert awake oriented and in no  distress.  Past Medical History:  Diagnosis Date  . Barrett's esophagus   . Calcium deposits in tendon and bursa   . Chronic airway obstruction, not elsewhere classified   . COPD (chronic obstructive pulmonary disease) ([place])   . Depression   . Diabetes mellitus 2008ish   type two  . Esophageal reflux   . Idiopathic thrombocythemia   . Kidney stones   . Lateral epicondylitis  of elbow   . Personal history of malignant neoplasm of other parts of uterus   . Personal history of other diseases of digestive system   . Shortness of breath    upon exertion    Past Surgical History:  Procedure Laterality Date  . ABDOMINAL HYSTERECTOMY    . CATARACT EXTRACTION Bilateral 01/2016 and 03/2016  . CHOLECYSTECTOMY N/A 10/03/2018   Procedure: LAPAROSCOPIC CHOLECYSTECTOMY WITH INTRAOPERATIVE CHOLANGIOGRAM;  Surgeon: Alphonsa Overall, MD;  Location: [facility];  Service: General;  Laterality: N/A;  . LITHOTRIPSY    . REFRACTIVE SURGERY Right 05/16/2016  . SPLENECTOMY  1999  . TONSILLECTOMY AND ADENOIDECTOMY      Current Medications: Current Meds  Medication Sig  . albuterol (PROVENTIL HFA;VENTOLIN HFA) 108 (90 Base) MCG/ACT inhaler Inhale 2 puffs into the lungs every 6 (six) hours as needed for wheezing or shortness of breath.  . Blood Glucose Monitoring Suppl (ONE TOUCH ULTRA SYSTEM KIT) w/Device KIT 1 kit by Does  not apply route once.  . cholecalciferol (VITAMIN D) 1000 UNITS tablet Take 5,000 Units by mouth daily.  . Cholecalciferol (VITAMIN D-3) 125 MCG (5000 UT) TABS Take 125 mcg by mouth daily.  . Dulaglutide (TRULICITY) 8.58 IF/0.2DX SOPN Inject into the skin once a week.  . fluticasone (FLONASE) 50 MCG/ACT nasal spray Place 2 sprays into the nose daily as needed for rhinitis. Reported on 01/23/2016  . Fluticasone-Umeclidin-Vilant (TRELEGY ELLIPTA) 100-62.5-25 MCG/INH AEPB Inhale 1 puff into the lungs daily.  Marland Kitchen glucose blood (ONE TOUCH ULTRA TEST) test strip USE 1 STRIP TO CHECK GLUCOSE TWICE  DAILY  . Insulin Degludec (TRESIBA) 100 UNIT/ML SOLN Inject into the skin daily.  . Insulin Syringe-Needle U-100 (B-D INS SYRINGE 0.5CC/31GX5/16) 31G X 5/16" 0.5 ML MISC Use qd  . ipratropium-albuterol (DUONEB) 0.5-2.5 (3) MG/3ML SOLN Inhale 3 mLs into the lungs as needed.  Marland Kitchen levocetirizine (XYZAL) 5 MG tablet Take 5 mg by mouth at bedtime.  Marland Kitchen lisinopril (ZESTRIL) 10 MG tablet   . montelukast (SINGULAIR) 10 MG tablet Take 10 mg by mouth daily.  Marland Kitchen omeprazole (PRILOSEC) 40 MG capsule Take 1 capsule (40 mg total) by mouth daily.  . ONE TOUCH LANCETS MISC 1 each by Does not apply route 2 (two) times daily.  Marland Kitchen rOPINIRole (REQUIP) 0.5 MG tablet   . rosuvastatin (CRESTOR) 5 MG tablet Take 1 tablet (5 mg total) by mouth daily.  . [DISCONTINUED] Cetirizine HCl (ZYRTEC PO) Take 1 tablet by mouth daily.   Current Facility-Administered Medications for the 08/05/19 encounter (Office Visit) with Revankar, Reita Cliche, MD  Medication  . ipratropium-albuterol (DUONEB) 0.5-2.5 (3) MG/3ML nebulizer solution 3 mL     Allergies:   Amoxicillin-pot clavulanate, Ertugliflozin, and Levaquin [levofloxacin]   Social History   Socioeconomic History  . Marital status: Single    Spouse name: Not on file  . Number of children: 0  . Years of education: Not on file  . Highest education level: Not on file  Occupational History  . Occupation: retired    Fish farm manager: Aeronautical engineer  Social Needs  . Financial resource strain: Not on file  . Food insecurity    Worry: Not on file    Inability: Not on file  . Transportation needs    Medical: Not on file    Non-medical: Not on file  Tobacco Use  . Smoking status: Former Smoker    Packs/day: 2.00    Years: 49.00    Pack years: 98.00    Types: Cigarettes    Quit date: 11/11/2005    Years since quitting: 13.7  . Smokeless tobacco: Never Used  Substance and Sexual Activity  . Alcohol use: Yes    Alcohol/week: 0.0 standard drinks    Comment: beer, whiskey,  margherita  . Drug use: No  . Sexual activity: Not Currently  Lifestyle  . Physical activity    Days per week: Not on file    Minutes per session: Not on file  . Stress: Not on file  Relationships  . Social Herbalist on phone: Not on file    Gets together: Not on file    Attends religious service: Not on file    Active member of club or organization: Not on file    Attends meetings of clubs or organizations: Not on file    Relationship status: Not on file  Other Topics Concern  . Not on file  Social History Narrative   Retired - Chief Operating Officer  Lives alone   Single     Family History: The patient's family history includes Diabetes in her father, maternal aunt, and sister; Heart disease in her father and mother; Liver cancer in her sister; Pancreatic cancer in her sister; Stroke in her mother.  ROS:   Please see the history of present illness.    All other systems reviewed and are negative.  EKGs/Labs/Other Studies Reviewed:    The following studies were reviewed today: I discussed echocardiogram report from last evaluation.   Recent Labs: 10/04/2018: ALT 109; BUN 13; Creatinine, Ser 1.00; Hemoglobin 9.7; Platelets 413; Potassium 4.2; Sodium 137  Recent Lipid Panel    Component Value Date/Time   CHOL 228 (H) 12/12/2016 0853   TRIG 114.0 12/12/2016 0853   HDL 52.60 12/12/2016 0853   CHOLHDL 4 12/12/2016 0853   VLDL 22.8 12/12/2016 0853   LDLCALC 153 (H) 12/12/2016 0853   LDLDIRECT 160.1 07/12/2014 1423    Physical Exam:    VS:  Ht '5\' 2"'  (1.575 m)   Wt 159 lb (72.1 kg)   BMI 29.08 kg/m     Wt Readings from Last 3 Encounters:  08/05/19 159 lb (72.1 kg)  06/22/19 164 lb (74.4 kg)  10/04/18 161 lb 2.5 oz (73.1 kg)     GEN: Patient is in no acute distress HEENT: Normal NECK: No JVD; No carotid bruits LYMPHATICS: No lymphadenopathy CARDIAC: Hear sounds regular, 2/6 systolic murmur at the apex. RESPIRATORY:  Clear to auscultation  without rales, wheezing or rhonchi  ABDOMEN: Soft, non-tender, non-distended MUSCULOSKELETAL:  No edema; No deformity  SKIN: Warm and dry NEUROLOGIC:  Alert and oriented x 3 PSYCHIATRIC:  Normal affect   Signed, Jenean Lindau, MD  08/05/2019 8:32 AM    [place]

## 2019-08-05 NOTE — Patient Instructions (Signed)
Medication Instructions:  Your physician recommends that you continue on your current medications as directed. Please refer to the Current Medication list given to you today.  If you need a refill on your cardiac medications before your next appointment, please call your pharmacy.   Lab work: None If you have labs (blood work) drawn today and your tests are completely normal, you will receive your results only by: . MyChart Message (if you have MyChart) OR . A paper copy in the mail If you have any lab test that is abnormal or we need to change your treatment, we will call you to review the results.  Testing/Procedures: None  Follow-Up: At [facility], you and your health needs are our priority.  As part of our continuing mission to provide you with exceptional heart care, we have created designated Provider Care Teams.  These Care Teams include your primary Cardiologist (physician) and Advanced Practice Providers (APPs -  Physician Assistants and Nurse Practitioners) who all work together to provide you with the care you need, when you need it. You will need a follow up appointment in 6 months.  Please call our office 2 months in advance to schedule this appointment. Any Other Special Instructions Will Be Listed Below (If Applicable).    

## 2019-08-05 NOTE — Telephone Encounter (Signed)
States someone was supposed to call her to let her know if she could have a stress test or not

## 2019-08-06 DIAGNOSIS — R1032 Left lower quadrant pain: Secondary | ICD-10-CM | POA: Diagnosis not present

## 2019-08-06 DIAGNOSIS — N200 Calculus of kidney: Secondary | ICD-10-CM | POA: Diagnosis not present

## 2019-08-10 ENCOUNTER — Other Ambulatory Visit: Payer: Self-pay

## 2019-08-10 ENCOUNTER — Ambulatory Visit (INDEPENDENT_AMBULATORY_CARE_PROVIDER_SITE_OTHER): Payer: PPO

## 2019-08-10 DIAGNOSIS — R0609 Other forms of dyspnea: Secondary | ICD-10-CM | POA: Diagnosis not present

## 2019-08-10 DIAGNOSIS — R0789 Other chest pain: Secondary | ICD-10-CM

## 2019-08-10 MED ORDER — REGADENOSON 0.4 MG/5ML IV SOLN
0.4000 mg | Freq: Once | INTRAVENOUS | Status: AC
  Administered 2019-08-10: 0.4 mg via INTRAVENOUS

## 2019-08-10 MED ORDER — TECHNETIUM TC 99M TETROFOSMIN IV KIT
9.9000 | PACK | Freq: Once | INTRAVENOUS | Status: AC | PRN
  Administered 2019-08-10: 9.9 via INTRAVENOUS

## 2019-08-10 MED ORDER — TECHNETIUM TC 99M TETROFOSMIN IV KIT
32.2000 | PACK | Freq: Once | INTRAVENOUS | Status: AC | PRN
  Administered 2019-08-10: 32.2 via INTRAVENOUS

## 2019-08-11 DIAGNOSIS — E785 Hyperlipidemia, unspecified: Secondary | ICD-10-CM | POA: Diagnosis not present

## 2019-08-11 DIAGNOSIS — E119 Type 2 diabetes mellitus without complications: Secondary | ICD-10-CM | POA: Diagnosis not present

## 2019-08-13 LAB — MYOCARDIAL PERFUSION IMAGING
LV dias vol: 52 mL (ref 46–106)
LV sys vol: 16 mL
Peak HR: 108 {beats}/min
Rest HR: 63 {beats}/min
SDS: 2
SRS: 2
SSS: 4
TID: 1.03

## 2019-08-18 DIAGNOSIS — Z6828 Body mass index (BMI) 28.0-28.9, adult: Secondary | ICD-10-CM | POA: Diagnosis not present

## 2019-08-18 DIAGNOSIS — E119 Type 2 diabetes mellitus without complications: Secondary | ICD-10-CM | POA: Diagnosis not present

## 2019-08-18 DIAGNOSIS — D638 Anemia in other chronic diseases classified elsewhere: Secondary | ICD-10-CM | POA: Diagnosis not present

## 2019-08-18 DIAGNOSIS — N390 Urinary tract infection, site not specified: Secondary | ICD-10-CM | POA: Diagnosis not present

## 2019-08-18 DIAGNOSIS — Z794 Long term (current) use of insulin: Secondary | ICD-10-CM | POA: Diagnosis not present

## 2019-08-18 DIAGNOSIS — Z9081 Acquired absence of spleen: Secondary | ICD-10-CM | POA: Diagnosis not present

## 2019-08-18 DIAGNOSIS — A419 Sepsis, unspecified organism: Secondary | ICD-10-CM | POA: Diagnosis not present

## 2019-08-18 DIAGNOSIS — R7989 Other specified abnormal findings of blood chemistry: Secondary | ICD-10-CM | POA: Diagnosis not present

## 2019-08-19 ENCOUNTER — Telehealth: Payer: Self-pay

## 2019-08-19 ENCOUNTER — Telehealth: Payer: Self-pay | Admitting: Cardiology

## 2019-08-19 DIAGNOSIS — Z9081 Acquired absence of spleen: Secondary | ICD-10-CM | POA: Diagnosis not present

## 2019-08-19 DIAGNOSIS — D649 Anemia, unspecified: Secondary | ICD-10-CM | POA: Diagnosis not present

## 2019-08-19 DIAGNOSIS — Z8542 Personal history of malignant neoplasm of other parts of uterus: Secondary | ICD-10-CM | POA: Diagnosis not present

## 2019-08-19 DIAGNOSIS — Z794 Long term (current) use of insulin: Secondary | ICD-10-CM | POA: Diagnosis not present

## 2019-08-19 DIAGNOSIS — D473 Essential (hemorrhagic) thrombocythemia: Secondary | ICD-10-CM | POA: Diagnosis not present

## 2019-08-19 DIAGNOSIS — E119 Type 2 diabetes mellitus without complications: Secondary | ICD-10-CM | POA: Diagnosis not present

## 2019-08-19 DIAGNOSIS — Z90710 Acquired absence of both cervix and uterus: Secondary | ICD-10-CM | POA: Diagnosis not present

## 2019-08-19 DIAGNOSIS — R7989 Other specified abnormal findings of blood chemistry: Secondary | ICD-10-CM | POA: Diagnosis not present

## 2019-08-19 DIAGNOSIS — Z9049 Acquired absence of other specified parts of digestive tract: Secondary | ICD-10-CM | POA: Diagnosis not present

## 2019-08-19 DIAGNOSIS — D72829 Elevated white blood cell count, unspecified: Secondary | ICD-10-CM | POA: Diagnosis not present

## 2019-08-19 DIAGNOSIS — Z79891 Long term (current) use of opiate analgesic: Secondary | ICD-10-CM | POA: Diagnosis not present

## 2019-08-19 DIAGNOSIS — I10 Essential (primary) hypertension: Secondary | ICD-10-CM | POA: Diagnosis not present

## 2019-08-19 DIAGNOSIS — K219 Gastro-esophageal reflux disease without esophagitis: Secondary | ICD-10-CM | POA: Diagnosis not present

## 2019-08-19 DIAGNOSIS — Z79899 Other long term (current) drug therapy: Secondary | ICD-10-CM | POA: Diagnosis not present

## 2019-08-19 DIAGNOSIS — J449 Chronic obstructive pulmonary disease, unspecified: Secondary | ICD-10-CM | POA: Diagnosis not present

## 2019-08-19 DIAGNOSIS — E7800 Pure hypercholesterolemia, unspecified: Secondary | ICD-10-CM | POA: Diagnosis not present

## 2019-08-19 NOTE — Telephone Encounter (Signed)
Results relayed, copy sent to Dr. Jeryl Columbia.

## 2019-08-19 NOTE — Telephone Encounter (Signed)
-----   Message from Jenean Lindau, MD sent at 08/13/2019  8:27 AM EDT ----- The results of the study is unremarkable. Please inform patient. I will discuss in detail at next appointment. Cc  primary care/referring physician Jenean Lindau, MD 08/13/2019 8:27 AM

## 2019-08-19 NOTE — Telephone Encounter (Signed)
Please call patient with test results.

## 2019-08-24 DIAGNOSIS — R5383 Other fatigue: Secondary | ICD-10-CM | POA: Diagnosis not present

## 2019-08-24 DIAGNOSIS — R05 Cough: Secondary | ICD-10-CM | POA: Diagnosis not present

## 2019-08-24 DIAGNOSIS — G4761 Periodic limb movement disorder: Secondary | ICD-10-CM | POA: Diagnosis not present

## 2019-08-24 DIAGNOSIS — J4540 Moderate persistent asthma, uncomplicated: Secondary | ICD-10-CM | POA: Diagnosis not present

## 2019-08-26 ENCOUNTER — Ambulatory Visit: Payer: PPO | Admitting: Cardiology

## 2019-08-26 DIAGNOSIS — D509 Iron deficiency anemia, unspecified: Secondary | ICD-10-CM | POA: Diagnosis not present

## 2019-08-27 ENCOUNTER — Encounter: Payer: Self-pay | Admitting: Cardiology

## 2019-08-27 ENCOUNTER — Other Ambulatory Visit: Payer: Self-pay

## 2019-08-27 ENCOUNTER — Ambulatory Visit (INDEPENDENT_AMBULATORY_CARE_PROVIDER_SITE_OTHER): Payer: PPO | Admitting: Cardiology

## 2019-08-27 VITALS — BP 148/70 | HR 78 | Ht 62.0 in | Wt 164.0 lb

## 2019-08-27 DIAGNOSIS — E088 Diabetes mellitus due to underlying condition with unspecified complications: Secondary | ICD-10-CM | POA: Diagnosis not present

## 2019-08-27 DIAGNOSIS — I313 Pericardial effusion (noninflammatory): Secondary | ICD-10-CM | POA: Diagnosis not present

## 2019-08-27 DIAGNOSIS — N200 Calculus of kidney: Secondary | ICD-10-CM | POA: Diagnosis not present

## 2019-08-27 DIAGNOSIS — I3139 Other pericardial effusion (noninflammatory): Secondary | ICD-10-CM

## 2019-08-27 NOTE — Progress Notes (Signed)
Cardiology Office Note:    Date:  08/27/2019   ID:  Pam Gamble, DOB Mar 24, 1946, MRN [id number]  PCP:  Pam Grammes, MD  Cardiologist:  Pam Lindau, MD   Referring MD: Pam Grammes, MD    ASSESSMENT:    1. Pericardial effusion   2. Diabetes mellitus due to underlying condition with unspecified complications ([place])    PLAN:    In order of problems listed above:  1. Pericardial effusion: Stable at this time.  In view of recent testing and stress testing and echocardiogram being stable I would not do any further intervention.  She has had a significant admission to the hospital.  She was admitted and discharged and feels better.  At the time of my evaluation, the patient is alert awake oriented and in no distress.  She will continue current medications and see me in follow-up appointment in 6 weeks or earlier if she has any concerns.  I reviewed Pam Gamble records extensively.   Medication Adjustments/Labs and Tests Ordered: Current medicines are reviewed at length with the patient today.  Concerns regarding medicines are outlined above.  No orders of the defined types were placed in this encounter.  No orders of the defined types were placed in this encounter.    No chief complaint on file.    History of Present Illness:    Pam Gamble is a 73 y.o. female.  She was in the hospital related to urosepsis and was very sick.  Subsequently she is recovered and feeling better.  She is a little weak.  She denies any chest pain orthopnea or PND.  She takes care of activities of daily living.  She has no significant shortness of breath.  At the time of my evaluation, the patient is alert awake oriented and in no distress. Past Medical History:  Diagnosis Date  . Barrett's esophagus   . Calcium deposits in tendon and bursa   . Chronic airway obstruction, not elsewhere classified   . COPD (chronic obstructive pulmonary disease) ([place])   . Depression   . Diabetes  mellitus 2008ish   type two  . Esophageal reflux   . Idiopathic thrombocythemia   . Kidney stones   . Lateral epicondylitis  of elbow   . Personal history of malignant neoplasm of other parts of uterus   . Personal history of other diseases of digestive system   . Shortness of breath    upon exertion    Past Surgical History:  Procedure Laterality Date  . ABDOMINAL HYSTERECTOMY    . CATARACT EXTRACTION Bilateral 01/2016 and 03/2016  . CHOLECYSTECTOMY N/A 10/03/2018   Procedure: LAPAROSCOPIC CHOLECYSTECTOMY WITH INTRAOPERATIVE CHOLANGIOGRAM;  Surgeon: Alphonsa Overall, MD;  Location: [facility];  Service: General;  Laterality: N/A;  . LITHOTRIPSY    . REFRACTIVE SURGERY Right 05/16/2016  . SPLENECTOMY  1999  . TONSILLECTOMY AND ADENOIDECTOMY      Current Medications: Current Meds  Medication Sig  . albuterol (PROVENTIL HFA;VENTOLIN HFA) 108 (90 Base) MCG/ACT inhaler Inhale 2 puffs into the lungs every 6 (six) hours as needed for wheezing or shortness of breath.  Marland Kitchen aspirin EC 81 MG tablet Take 81 mg by mouth daily.  . Blood Glucose Monitoring Suppl (ONE TOUCH ULTRA SYSTEM KIT) w/Device KIT 1 kit by Does not apply route once.  . Cholecalciferol (VITAMIN D-3) 125 MCG (5000 UT) TABS Take 125 mcg by mouth daily.  . Dulaglutide (TRULICITY) 5.10 CH/8.5ID SOPN Inject into the skin once a week.  Marland Kitchen  fluticasone (FLONASE) 50 MCG/ACT nasal spray Place 2 sprays into the nose daily as needed for rhinitis. Reported on 01/23/2016  . Fluticasone-Umeclidin-Vilant (TRELEGY ELLIPTA) 100-62.5-25 MCG/INH AEPB Inhale 1 puff into the lungs daily.  . furosemide (LASIX) 20 MG tablet Take 20 mg by mouth daily as needed.   Marland Kitchen glucose blood (ONE TOUCH ULTRA TEST) test strip USE 1 STRIP TO CHECK GLUCOSE TWICE DAILY  . Insulin Degludec (TRESIBA) 100 UNIT/ML SOLN Inject 28 Units into the skin daily.   . Insulin Syringe-Needle U-100 (B-D INS SYRINGE 0.5CC/31GX5/16) 31G X 5/16" 0.5 ML MISC Use qd  . ipratropium-albuterol  (DUONEB) 0.5-2.5 (3) MG/3ML SOLN Inhale 3 mLs into the lungs as needed.  Marland Kitchen levocetirizine (XYZAL) 5 MG tablet Take 5 mg by mouth at bedtime.  . montelukast (SINGULAIR) 10 MG tablet Take 10 mg by mouth daily.  Marland Kitchen omeprazole (PRILOSEC) 40 MG capsule Take 1 capsule (40 mg total) by mouth daily.  . ONE TOUCH LANCETS MISC 1 each by Does not apply route 2 (two) times daily.  Marland Kitchen oxycodone (OXY-IR) 5 MG capsule Take 5 mg by mouth every 4 (four) hours as needed.  Marland Kitchen rOPINIRole (REQUIP) 0.5 MG tablet Take 0.5 mg by mouth at bedtime.   . rosuvastatin (CRESTOR) 5 MG tablet Take 1 tablet (5 mg total) by mouth daily.     Allergies:   Amoxicillin-pot clavulanate, Ertugliflozin, and Levaquin [levofloxacin]   Social History   Socioeconomic History  . Marital status: Single    Spouse name: Not on file  . Number of children: 0  . Years of education: Not on file  . Highest education level: Not on file  Occupational History  . Occupation: retired    Fish farm manager: [employer]  Social Needs  . Financial resource strain: Not on file  . Food insecurity    Worry: Not on file    Inability: Not on file  . Transportation needs    Medical: Not on file    Non-medical: Not on file  Tobacco Use  . Smoking status: Former Smoker    Packs/day: 2.00    Years: 49.00    Pack years: 98.00    Types: Cigarettes    Quit date: 11/11/2005    Years since quitting: 13.8  . Smokeless tobacco: Never Used  Substance and Sexual Activity  . Alcohol use: Yes    Alcohol/week: 0.0 standard drinks    Comment: beer, whiskey, margherita  . Drug use: No  . Sexual activity: Not Currently  Lifestyle  . Physical activity    Days per week: Not on file    Minutes per session: Not on file  . Stress: Not on file  Relationships  . Social Herbalist on phone: Not on file    Gets together: Not on file    Attends religious service: Not on file    Active member of club or organization: Not on file    Attends meetings of  clubs or organizations: Not on file    Relationship status: Not on file  Other Topics Concern  . Not on file  Social History Narrative   Retired - Chief Operating Officer   Lives alone   Single     Family History: The patient's family history includes Diabetes in her father, maternal aunt, and sister; Heart disease in her father and mother; Liver cancer in her sister; Pancreatic cancer in her sister; Stroke in her mother.  ROS:   Please see the history of  present illness.    All other systems reviewed and are negative.  EKGs/Labs/Other Studies Reviewed:    The following studies were reviewed today: Study Highlights   Nuclear stress EF: 68%.  The left ventricular ejection fraction is hyperdynamic (>65%).  There was no ST segment deviation noted during stress.  T wave inversion of 0.5 mm was noted during stress in the I, II, III, V5 and V6 leads, beginning at 1 minutes of stress, ending at 2 minutes of stress, and returning to baseline after less than 1 min of recovery.  Defect 1: There is a very small fixed defect of mild severity present in the mid anteroseptal location. There is normal wall motion.  This is a low risk study.  The study is normal. The fixed defect listed above is likely a soft tissue attenutation.     IMPRESSIONS    1. The left ventricle has normal systolic function with an ejection fraction of 60-65%. The cavity size was normal. There is moderate concentric left ventricular hypertrophy. Left ventricular diastolic Doppler parameters are consistent with impaired  relaxation. No evidence of left ventricular regional wall motion abnormalities.  2. The right ventricle has normal systolic function. The cavity was normal. There is no increase in right ventricular wall thickness.  3. The pericardial effusion is circumferential 5 mm maximum.  4. The pericardium appears thickened 4 mm.  5. The aortic root and ascending aorta are normal in size and structure.   6. Small pericardial effusion.  7. No evidence of mitral valve stenosis.  8. The aortic valve is tricuspid. No stenosis of the aortic valve.   Recent Labs: 10/04/2018: ALT 109; BUN 13; Creatinine, Ser 1.00; Hemoglobin 9.7; Platelets 413; Potassium 4.2; Sodium 137  Recent Lipid Panel    Component Value Date/Time   CHOL 228 (H) 12/12/2016 0853   TRIG 114.0 12/12/2016 0853   HDL 52.60 12/12/2016 0853   CHOLHDL 4 12/12/2016 0853   VLDL 22.8 12/12/2016 0853   LDLCALC 153 (H) 12/12/2016 0853   LDLDIRECT 160.1 07/12/2014 1423    Physical Exam:    VS:  BP (!) 148/70 (BP Location: Left Arm, Patient Position: Sitting, Cuff Size: Normal)   Pulse 78   Ht '5\' 2"'  (1.575 m)   Wt 164 lb (74.4 kg)   SpO2 96%   BMI 30.00 kg/m     Wt Readings from Last 3 Encounters:  08/27/19 164 lb (74.4 kg)  08/10/19 164 lb (74.4 kg)  08/05/19 159 lb (72.1 kg)     GEN: Patient is in no acute distress HEENT: Normal NECK: No JVD; No carotid bruits LYMPHATICS: No lymphadenopathy CARDIAC: Hear sounds regular, 2/6 systolic murmur at the apex. RESPIRATORY:  Clear to auscultation without rales, wheezing or rhonchi  ABDOMEN: Soft, non-tender, non-distended MUSCULOSKELETAL:  No edema; No deformity  SKIN: Warm and dry NEUROLOGIC:  Alert and oriented x 3 PSYCHIATRIC:  Normal affect   Signed, Pam Lindau, MD  08/27/2019 5:12 PM    [facility]

## 2019-08-27 NOTE — Patient Instructions (Signed)
Medication Instructions:  Your physician recommends that you continue on your current medications as directed. Please refer to the Current Medication list given to you today.  *If you need a refill on your cardiac medications before your next appointment, please call your pharmacy*  Lab Work: NONE  If you have labs (blood work) drawn today and your tests are completely normal, you will receive your results only by: Marland Kitchen MyChart Message (if you have MyChart) OR . A paper copy in the mail If you have any lab test that is abnormal or we need to change your treatment, we will call you to review the results.  Testing/Procedures: NONE  Follow-Up: At [facility], you and your health needs are our priority.  As part of our continuing mission to provide you with exceptional heart care, we have created designated Provider Care Teams.  These Care Teams include your primary Cardiologist (physician) and Advanced Practice Providers (APPs -  Physician Assistants and Nurse Practitioners) who all work together to provide you with the care you need, when you need it.  Your next appointment:   6 wk f/u The format for your next appointment:   In Person  Provider:   Jyl Heinz, MD

## 2019-09-02 DIAGNOSIS — D509 Iron deficiency anemia, unspecified: Secondary | ICD-10-CM | POA: Diagnosis not present

## 2019-09-10 DIAGNOSIS — J449 Chronic obstructive pulmonary disease, unspecified: Secondary | ICD-10-CM | POA: Diagnosis not present

## 2019-09-10 DIAGNOSIS — I5030 Unspecified diastolic (congestive) heart failure: Secondary | ICD-10-CM | POA: Diagnosis not present

## 2019-09-10 DIAGNOSIS — E119 Type 2 diabetes mellitus without complications: Secondary | ICD-10-CM | POA: Diagnosis not present

## 2019-09-15 DIAGNOSIS — K5900 Constipation, unspecified: Secondary | ICD-10-CM | POA: Diagnosis not present

## 2019-09-15 DIAGNOSIS — D509 Iron deficiency anemia, unspecified: Secondary | ICD-10-CM | POA: Diagnosis not present

## 2019-09-15 DIAGNOSIS — K219 Gastro-esophageal reflux disease without esophagitis: Secondary | ICD-10-CM | POA: Diagnosis not present

## 2019-09-21 DIAGNOSIS — E669 Obesity, unspecified: Secondary | ICD-10-CM | POA: Diagnosis not present

## 2019-09-21 DIAGNOSIS — N183 Chronic kidney disease, stage 3 unspecified: Secondary | ICD-10-CM | POA: Diagnosis not present

## 2019-09-21 DIAGNOSIS — E119 Type 2 diabetes mellitus without complications: Secondary | ICD-10-CM | POA: Diagnosis not present

## 2019-09-21 DIAGNOSIS — Z794 Long term (current) use of insulin: Secondary | ICD-10-CM | POA: Diagnosis not present

## 2019-09-21 DIAGNOSIS — Z6827 Body mass index (BMI) 27.0-27.9, adult: Secondary | ICD-10-CM | POA: Diagnosis not present

## 2019-09-21 DIAGNOSIS — D509 Iron deficiency anemia, unspecified: Secondary | ICD-10-CM | POA: Diagnosis not present

## 2019-09-21 DIAGNOSIS — G5791 Unspecified mononeuropathy of right lower limb: Secondary | ICD-10-CM | POA: Diagnosis not present

## 2019-09-21 DIAGNOSIS — I5030 Unspecified diastolic (congestive) heart failure: Secondary | ICD-10-CM | POA: Diagnosis not present

## 2019-10-04 ENCOUNTER — Other Ambulatory Visit: Payer: Self-pay

## 2019-10-11 ENCOUNTER — Ambulatory Visit: Payer: PPO | Admitting: Cardiology

## 2019-10-11 DIAGNOSIS — K219 Gastro-esophageal reflux disease without esophagitis: Secondary | ICD-10-CM | POA: Diagnosis not present

## 2019-10-11 DIAGNOSIS — M1830 Unilateral post-traumatic osteoarthritis of first carpometacarpal joint, unspecified hand: Secondary | ICD-10-CM | POA: Diagnosis not present

## 2019-10-11 DIAGNOSIS — E119 Type 2 diabetes mellitus without complications: Secondary | ICD-10-CM | POA: Diagnosis not present

## 2019-10-12 DIAGNOSIS — K635 Polyp of colon: Secondary | ICD-10-CM | POA: Diagnosis not present

## 2019-10-12 DIAGNOSIS — D126 Benign neoplasm of colon, unspecified: Secondary | ICD-10-CM | POA: Diagnosis not present

## 2019-10-12 DIAGNOSIS — K229 Disease of esophagus, unspecified: Secondary | ICD-10-CM | POA: Diagnosis not present

## 2019-10-12 DIAGNOSIS — D124 Benign neoplasm of descending colon: Secondary | ICD-10-CM | POA: Diagnosis not present

## 2019-10-12 DIAGNOSIS — K644 Residual hemorrhoidal skin tags: Secondary | ICD-10-CM | POA: Diagnosis not present

## 2019-10-12 DIAGNOSIS — K642 Third degree hemorrhoids: Secondary | ICD-10-CM | POA: Diagnosis not present

## 2019-10-12 DIAGNOSIS — K228 Other specified diseases of esophagus: Secondary | ICD-10-CM | POA: Diagnosis not present

## 2019-10-12 DIAGNOSIS — K22719 Barrett's esophagus with dysplasia, unspecified: Secondary | ICD-10-CM | POA: Diagnosis not present

## 2019-10-12 DIAGNOSIS — D500 Iron deficiency anemia secondary to blood loss (chronic): Secondary | ICD-10-CM | POA: Diagnosis not present

## 2019-10-12 DIAGNOSIS — K648 Other hemorrhoids: Secondary | ICD-10-CM | POA: Diagnosis not present

## 2019-10-12 DIAGNOSIS — D509 Iron deficiency anemia, unspecified: Secondary | ICD-10-CM | POA: Diagnosis not present

## 2019-10-12 DIAGNOSIS — K219 Gastro-esophageal reflux disease without esophagitis: Secondary | ICD-10-CM | POA: Diagnosis not present

## 2019-10-12 DIAGNOSIS — K317 Polyp of stomach and duodenum: Secondary | ICD-10-CM | POA: Diagnosis not present

## 2019-10-12 DIAGNOSIS — D649 Anemia, unspecified: Secondary | ICD-10-CM | POA: Diagnosis not present

## 2019-10-21 ENCOUNTER — Ambulatory Visit: Payer: PPO | Admitting: Cardiology

## 2019-10-25 ENCOUNTER — Ambulatory Visit: Payer: PPO | Admitting: Cardiology

## 2019-11-11 DIAGNOSIS — E119 Type 2 diabetes mellitus without complications: Secondary | ICD-10-CM | POA: Diagnosis not present

## 2019-11-11 DIAGNOSIS — J449 Chronic obstructive pulmonary disease, unspecified: Secondary | ICD-10-CM | POA: Diagnosis not present

## 2019-11-18 DIAGNOSIS — D509 Iron deficiency anemia, unspecified: Secondary | ICD-10-CM | POA: Diagnosis not present

## 2019-11-18 DIAGNOSIS — K219 Gastro-esophageal reflux disease without esophagitis: Secondary | ICD-10-CM | POA: Diagnosis not present

## 2019-11-18 DIAGNOSIS — N183 Chronic kidney disease, stage 3 unspecified: Secondary | ICD-10-CM | POA: Diagnosis not present

## 2019-11-18 DIAGNOSIS — J449 Chronic obstructive pulmonary disease, unspecified: Secondary | ICD-10-CM | POA: Diagnosis not present

## 2019-11-18 DIAGNOSIS — G253 Myoclonus: Secondary | ICD-10-CM | POA: Diagnosis not present

## 2019-11-18 DIAGNOSIS — Z794 Long term (current) use of insulin: Secondary | ICD-10-CM | POA: Diagnosis not present

## 2019-11-18 DIAGNOSIS — E119 Type 2 diabetes mellitus without complications: Secondary | ICD-10-CM | POA: Diagnosis not present

## 2019-11-18 DIAGNOSIS — Z6827 Body mass index (BMI) 27.0-27.9, adult: Secondary | ICD-10-CM | POA: Diagnosis not present

## 2019-11-18 DIAGNOSIS — E1122 Type 2 diabetes mellitus with diabetic chronic kidney disease: Secondary | ICD-10-CM | POA: Diagnosis not present

## 2019-11-18 DIAGNOSIS — G2581 Restless legs syndrome: Secondary | ICD-10-CM | POA: Diagnosis not present

## 2019-12-11 DIAGNOSIS — E119 Type 2 diabetes mellitus without complications: Secondary | ICD-10-CM | POA: Diagnosis not present

## 2019-12-11 DIAGNOSIS — J449 Chronic obstructive pulmonary disease, unspecified: Secondary | ICD-10-CM | POA: Diagnosis not present

## 2019-12-11 DIAGNOSIS — K219 Gastro-esophageal reflux disease without esophagitis: Secondary | ICD-10-CM | POA: Diagnosis not present

## 2019-12-14 DIAGNOSIS — D509 Iron deficiency anemia, unspecified: Secondary | ICD-10-CM | POA: Diagnosis not present

## 2019-12-23 DIAGNOSIS — Z79899 Other long term (current) drug therapy: Secondary | ICD-10-CM | POA: Diagnosis not present

## 2019-12-23 DIAGNOSIS — E7800 Pure hypercholesterolemia, unspecified: Secondary | ICD-10-CM | POA: Diagnosis not present

## 2019-12-23 DIAGNOSIS — Z794 Long term (current) use of insulin: Secondary | ICD-10-CM | POA: Diagnosis not present

## 2019-12-23 DIAGNOSIS — E119 Type 2 diabetes mellitus without complications: Secondary | ICD-10-CM | POA: Diagnosis not present

## 2019-12-27 DIAGNOSIS — K219 Gastro-esophageal reflux disease without esophagitis: Secondary | ICD-10-CM | POA: Diagnosis not present

## 2019-12-27 DIAGNOSIS — D509 Iron deficiency anemia, unspecified: Secondary | ICD-10-CM | POA: Diagnosis not present

## 2019-12-27 DIAGNOSIS — D126 Benign neoplasm of colon, unspecified: Secondary | ICD-10-CM | POA: Diagnosis not present

## 2019-12-28 DIAGNOSIS — N200 Calculus of kidney: Secondary | ICD-10-CM | POA: Diagnosis not present

## 2019-12-28 DIAGNOSIS — R1032 Left lower quadrant pain: Secondary | ICD-10-CM | POA: Diagnosis not present

## 2020-01-09 DIAGNOSIS — K219 Gastro-esophageal reflux disease without esophagitis: Secondary | ICD-10-CM | POA: Diagnosis not present

## 2020-01-09 DIAGNOSIS — J449 Chronic obstructive pulmonary disease, unspecified: Secondary | ICD-10-CM | POA: Diagnosis not present

## 2020-01-11 DIAGNOSIS — J441 Chronic obstructive pulmonary disease with (acute) exacerbation: Secondary | ICD-10-CM | POA: Diagnosis not present

## 2020-01-11 DIAGNOSIS — S29012A Strain of muscle and tendon of back wall of thorax, initial encounter: Secondary | ICD-10-CM | POA: Diagnosis not present

## 2020-01-11 DIAGNOSIS — G4761 Periodic limb movement disorder: Secondary | ICD-10-CM | POA: Diagnosis not present

## 2020-01-11 DIAGNOSIS — J4540 Moderate persistent asthma, uncomplicated: Secondary | ICD-10-CM | POA: Diagnosis not present

## 2020-01-11 DIAGNOSIS — R05 Cough: Secondary | ICD-10-CM | POA: Diagnosis not present

## 2020-01-11 DIAGNOSIS — R0600 Dyspnea, unspecified: Secondary | ICD-10-CM | POA: Diagnosis not present

## 2020-01-11 DIAGNOSIS — Z6828 Body mass index (BMI) 28.0-28.9, adult: Secondary | ICD-10-CM | POA: Diagnosis not present

## 2020-01-11 DIAGNOSIS — R5383 Other fatigue: Secondary | ICD-10-CM | POA: Diagnosis not present

## 2020-02-09 DIAGNOSIS — J449 Chronic obstructive pulmonary disease, unspecified: Secondary | ICD-10-CM | POA: Diagnosis not present

## 2020-02-09 DIAGNOSIS — K219 Gastro-esophageal reflux disease without esophagitis: Secondary | ICD-10-CM | POA: Diagnosis not present

## 2020-02-10 DIAGNOSIS — Z794 Long term (current) use of insulin: Secondary | ICD-10-CM | POA: Diagnosis not present

## 2020-02-10 DIAGNOSIS — E119 Type 2 diabetes mellitus without complications: Secondary | ICD-10-CM | POA: Diagnosis not present

## 2020-02-10 DIAGNOSIS — N3000 Acute cystitis without hematuria: Secondary | ICD-10-CM | POA: Diagnosis not present

## 2020-02-10 DIAGNOSIS — R109 Unspecified abdominal pain: Secondary | ICD-10-CM | POA: Diagnosis not present

## 2020-02-10 DIAGNOSIS — I5030 Unspecified diastolic (congestive) heart failure: Secondary | ICD-10-CM | POA: Diagnosis not present

## 2020-02-10 DIAGNOSIS — J449 Chronic obstructive pulmonary disease, unspecified: Secondary | ICD-10-CM | POA: Diagnosis not present

## 2020-02-10 DIAGNOSIS — Z6828 Body mass index (BMI) 28.0-28.9, adult: Secondary | ICD-10-CM | POA: Diagnosis not present

## 2020-02-10 DIAGNOSIS — Z1231 Encounter for screening mammogram for malignant neoplasm of breast: Secondary | ICD-10-CM | POA: Diagnosis not present

## 2020-02-10 DIAGNOSIS — S29012A Strain of muscle and tendon of back wall of thorax, initial encounter: Secondary | ICD-10-CM | POA: Diagnosis not present

## 2020-02-10 DIAGNOSIS — Z2821 Immunization not carried out because of patient refusal: Secondary | ICD-10-CM | POA: Diagnosis not present

## 2020-02-23 DIAGNOSIS — R5383 Other fatigue: Secondary | ICD-10-CM | POA: Diagnosis not present

## 2020-02-23 DIAGNOSIS — R05 Cough: Secondary | ICD-10-CM | POA: Diagnosis not present

## 2020-02-23 DIAGNOSIS — J4540 Moderate persistent asthma, uncomplicated: Secondary | ICD-10-CM | POA: Diagnosis not present

## 2020-02-23 DIAGNOSIS — G4761 Periodic limb movement disorder: Secondary | ICD-10-CM | POA: Diagnosis not present

## 2020-02-25 DIAGNOSIS — N179 Acute kidney failure, unspecified: Secondary | ICD-10-CM | POA: Diagnosis not present

## 2020-02-25 DIAGNOSIS — N1832 Chronic kidney disease, stage 3b: Secondary | ICD-10-CM | POA: Diagnosis not present

## 2020-02-25 DIAGNOSIS — K2270 Barrett's esophagus without dysplasia: Secondary | ICD-10-CM | POA: Diagnosis not present

## 2020-02-25 DIAGNOSIS — E1122 Type 2 diabetes mellitus with diabetic chronic kidney disease: Secondary | ICD-10-CM | POA: Diagnosis not present

## 2020-02-25 DIAGNOSIS — N200 Calculus of kidney: Secondary | ICD-10-CM | POA: Diagnosis not present

## 2020-02-25 DIAGNOSIS — I5032 Chronic diastolic (congestive) heart failure: Secondary | ICD-10-CM | POA: Diagnosis not present

## 2020-03-02 ENCOUNTER — Ambulatory Visit: Payer: PPO | Admitting: Cardiology

## 2020-03-07 DIAGNOSIS — Z23 Encounter for immunization: Secondary | ICD-10-CM | POA: Diagnosis not present

## 2020-03-07 DIAGNOSIS — N3000 Acute cystitis without hematuria: Secondary | ICD-10-CM | POA: Diagnosis not present

## 2020-03-07 DIAGNOSIS — R05 Cough: Secondary | ICD-10-CM | POA: Diagnosis not present

## 2020-03-07 DIAGNOSIS — Z6828 Body mass index (BMI) 28.0-28.9, adult: Secondary | ICD-10-CM | POA: Diagnosis not present

## 2020-03-07 DIAGNOSIS — J189 Pneumonia, unspecified organism: Secondary | ICD-10-CM | POA: Diagnosis not present

## 2020-03-07 DIAGNOSIS — B373 Candidiasis of vulva and vagina: Secondary | ICD-10-CM | POA: Diagnosis not present

## 2020-03-10 DIAGNOSIS — E119 Type 2 diabetes mellitus without complications: Secondary | ICD-10-CM | POA: Diagnosis not present

## 2020-03-10 DIAGNOSIS — K219 Gastro-esophageal reflux disease without esophagitis: Secondary | ICD-10-CM | POA: Diagnosis not present

## 2020-03-10 DIAGNOSIS — J449 Chronic obstructive pulmonary disease, unspecified: Secondary | ICD-10-CM | POA: Diagnosis not present

## 2020-03-21 DIAGNOSIS — Z6828 Body mass index (BMI) 28.0-28.9, adult: Secondary | ICD-10-CM | POA: Diagnosis not present

## 2020-03-21 DIAGNOSIS — J189 Pneumonia, unspecified organism: Secondary | ICD-10-CM | POA: Diagnosis not present

## 2020-03-21 DIAGNOSIS — J90 Pleural effusion, not elsewhere classified: Secondary | ICD-10-CM | POA: Diagnosis not present

## 2020-03-21 DIAGNOSIS — J302 Other seasonal allergic rhinitis: Secondary | ICD-10-CM | POA: Diagnosis not present

## 2020-03-21 DIAGNOSIS — J441 Chronic obstructive pulmonary disease with (acute) exacerbation: Secondary | ICD-10-CM | POA: Diagnosis not present

## 2020-03-21 DIAGNOSIS — E1169 Type 2 diabetes mellitus with other specified complication: Secondary | ICD-10-CM | POA: Diagnosis not present

## 2020-03-21 DIAGNOSIS — E669 Obesity, unspecified: Secondary | ICD-10-CM | POA: Diagnosis not present

## 2020-03-28 ENCOUNTER — Encounter: Payer: Self-pay | Admitting: Cardiology

## 2020-03-28 ENCOUNTER — Ambulatory Visit: Payer: PPO | Admitting: Cardiology

## 2020-03-28 ENCOUNTER — Other Ambulatory Visit: Payer: Self-pay | Admitting: Cardiology

## 2020-03-28 ENCOUNTER — Other Ambulatory Visit: Payer: Self-pay

## 2020-03-28 VITALS — BP 142/68 | HR 80 | Temp 97.2°F | Ht 62.0 in | Wt 166.0 lb

## 2020-03-28 DIAGNOSIS — I313 Pericardial effusion (noninflammatory): Secondary | ICD-10-CM

## 2020-03-28 DIAGNOSIS — E1100 Type 2 diabetes mellitus with hyperosmolarity without nonketotic hyperglycemic-hyperosmolar coma (NKHHC): Secondary | ICD-10-CM

## 2020-03-28 DIAGNOSIS — I3139 Other pericardial effusion (noninflammatory): Secondary | ICD-10-CM

## 2020-03-28 DIAGNOSIS — E782 Mixed hyperlipidemia: Secondary | ICD-10-CM

## 2020-03-28 DIAGNOSIS — E088 Diabetes mellitus due to underlying condition with unspecified complications: Secondary | ICD-10-CM | POA: Diagnosis not present

## 2020-03-28 HISTORY — DX: Mixed hyperlipidemia: E78.2

## 2020-03-28 NOTE — Progress Notes (Signed)
Cardiology Office Note:    Date:  03/28/2020   ID:  Pam Gamble, DOB 09/12/1946, MRN [id number]  PCP:  Serita Grammes, MD  Cardiologist:  Jenean Lindau, MD   Referring MD: Serita Grammes, MD    ASSESSMENT:    1. Diabetes mellitus due to underlying condition with unspecified complications ([place])   2. Pericardial effusion   3. Mixed dyslipidemia    PLAN:    In order of problems listed above:  1. Primary prevention stressed with the patient.  Importance of compliance with diet medication stressed and she vocalized understanding. 2. Pericardial effusion: Asymptomatic at this time we will do echocardiogram to follow-up on this one. 3. Mixed dyslipidemia: On statin therapy and managed by primary care.  Diet was emphasized. 4. Diabetes mellitus: Managed by primary care physician.  Last hemoglobin A1c was elevated.  I had an extensive discussion with her about diet. 5. Patient will be seen in follow-up appointment in 6 months or earlier if the patient has any concerns    Medication Adjustments/Labs and Tests Ordered: Current medicines are reviewed at length with the patient today.  Concerns regarding medicines are outlined above.  No orders of the defined types were placed in this encounter.  No orders of the defined types were placed in this encounter.    No chief complaint on file.    History of Present Illness:    Pam Gamble is a 74 y.o. female patient has past medical history of pericardial effusion, dyslipidemia and diabetes mellitus.  She denies any problems at this time and takes care of activities of daily living.  No chest pain orthopnea or PND.  She is being evaluated and treated for pneumonitis.  She is here for routine follow-up.  At the time of my evaluation, the patient is alert awake oriented and in no distress.  Past Medical History:  Diagnosis Date  . Abdominal pain, unspecified site 07/12/2014   8/15 lower R>L ? etiol - r/o pyelo and others    . Acute biliary pancreatitis 10/01/2018  . Acute cholangitis 10/01/2018  . Acute non-recurrent maxillary sinusitis 01/12/2011   Qualifier: Diagnosis of  By: Madilyn Fireman MD, Barnetta Chapel    . Allergic rhinitis 01/08/2008   Chronic    . Atrophic vaginitis 05/03/2015   6/16   . Barrett's esophagus   . C O P D WITH ACUTE EXACERBATION 03/21/2010   9/13, 11/14, 8/18  [name], Leanna Sato.  Potential benefits of a short/long term steroid  use as well as potential risks  and complications were explained to the patient and were aknowledged.     . Calcium deposits in tendon and bursa   . CAROTID BRUIT 02/09/2008   Qualifier: Diagnosis of  By: Plotnikov MD, Evie Lacks   . Chest discomfort 06/22/2019  . Chronic airway obstruction, not elsewhere classified   . COPD (chronic obstructive pulmonary disease) ([place])   . COPD, frequent exacerbations ([place]) 09/15/2007   Dr Vaughan Browner Chronic, severe Advair Albuterol HHN prn Steroids - frequent use  Pt sleeps in a recliner since 2012   . Cough 05/23/2012  . Depression   . Diabetes mellitus 2008ish   type two  . Diabetes mellitus due to underlying condition with unspecified complications ([place]) 02/07/5187  . Diabetes type 2, uncontrolled ([place]) 12/27/2013   2/15 2/17 Levemir - too $$$. Started Toujeo (10/17) samples Continue with current prescription therapy as reflected on the Med list - Levemir. Risks associated with treatment noncompliance were discussed. Compliance was  encouraged. 2018 Metformin and NPH insulin 5/18 Use NPH insulin 40 u in am and 10 u at night. Titrate up by 1 unit a day each (AM and HS doses) for goal sugars of 100-130 up to 60   . Dyspnea on exertion 06/22/2019  . Edema 09/09/2014   10/15 B LE   . Esophageal reflux   . Ex-smoker 06/22/2019  . Fever, unspecified 07/12/2014   8/15 mild T99F   . HYSTERECTOMY, PARTIAL, HX OF 09/26/2007   Qualifier: Diagnosis of  By: Danny Lawless CMA, [place]    . Idiopathic thrombocythemia   . Kidney stone 07/27/2014   2014 Dr Janice Norrie  .  Kidney stones   . Lateral epicondylitis  of elbow   . Leg cramp 04/15/2014    Albuterol can give cramps   . Low back pain 04/25/2017   Spinal stenosis  . Major depressive disorder, recurrent episode ([place]) 06/14/2009   Chronic     . Noncompliance 09/10/2016   Recurrent   . Pericardial effusion 06/22/2019  . Personal history of malignant neoplasm of other parts of uterus   . Personal history of other diseases of digestive system   . 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 Pyelonephritis, acute 02/26/2011   2012, 2015 L   . Rotator cuff arthropathy 03/21/2015   Injection 03/21/2015   . Shortness of breath    upon exertion  . Tick bite 01/18/2017  . TOBACCO USE, QUIT 01/10/2010   Qualifier: Diagnosis of  By: Doralee Albino      Past Surgical History:  Procedure Laterality Date  . ABDOMINAL HYSTERECTOMY    . CATARACT EXTRACTION Bilateral 01/2016 and 03/2016  . CHOLECYSTECTOMY N/A 10/03/2018   Procedure: LAPAROSCOPIC CHOLECYSTECTOMY WITH INTRAOPERATIVE CHOLANGIOGRAM;  Surgeon: Alphonsa Overall, MD;  Location: [facility];  Service: General;  Laterality: N/A;  . LITHOTRIPSY    . REFRACTIVE SURGERY Right 05/16/2016  . SPLENECTOMY  1999  . TONSILLECTOMY AND ADENOIDECTOMY      Current Medications: Current Meds  Medication Sig  . albuterol (PROVENTIL HFA;VENTOLIN HFA) 108 (90 Base) MCG/ACT inhaler Inhale 2 puffs into the lungs every 6 (six) hours as needed for wheezing or shortness of breath.  Marland Kitchen aspirin EC 81 MG tablet Take 81 mg by mouth daily.  . Azelastine HCl 137 MCG/SPRAY SOLN Place 2 sprays into both nostrils 2 (two) times daily.  . Blood Glucose Monitoring Suppl (ONE TOUCH ULTRA SYSTEM KIT) w/Device KIT 1 kit by Does not apply route once.  . Cholecalciferol (VITAMIN D-3) 125 MCG (5000 UT) TABS Take 125 mcg by mouth daily.  . cyclobenzaprine (FLEXERIL) 5 MG tablet Take 5 mg by mouth 2 (two) times daily as needed.  . doxycycline (VIBRA-TABS) 100 MG tablet Take 100 mg by  mouth 2 (two) times daily.  . Fluticasone-Umeclidin-Vilant (TRELEGY ELLIPTA) 100-62.5-25 MCG/INH AEPB Inhale 1 puff into the lungs daily.  . furosemide (LASIX) 20 MG tablet Take 20 mg by mouth daily as needed.   Marland Kitchen glucose blood (ONE TOUCH ULTRA TEST) test strip USE 1 STRIP TO CHECK GLUCOSE TWICE DAILY  . Insulin Degludec (TRESIBA) 100 UNIT/ML SOLN Inject 28 Units into the skin daily.   . Insulin Syringe-Needle U-100 (B-D INS SYRINGE 0.5CC/31GX5/16) 31G X 5/16" 0.5 ML MISC Use qd  . ipratropium-albuterol (DUONEB) 0.5-2.5 (3) MG/3ML SOLN Inhale 3 mLs into the lungs as needed.  Marland Kitchen levocetirizine (XYZAL) 5 MG tablet Take 5 mg by mouth at bedtime.  Marland Kitchen lisinopril (ZESTRIL) 10 MG tablet   .  montelukast (SINGULAIR) 10 MG tablet Take 10 mg by mouth daily.  Marland Kitchen omeprazole (PRILOSEC) 40 MG capsule Take 1 capsule (40 mg total) by mouth daily.  . ONE TOUCH LANCETS MISC 1 each by Does not apply route 2 (two) times daily.  Marland Kitchen oxycodone (OXY-IR) 5 MG capsule Take 5 mg by mouth every 4 (four) hours as needed.  Marland Kitchen rOPINIRole (REQUIP) 2 MG tablet Take 2 mg by mouth at bedtime.  . rosuvastatin (CRESTOR) 10 MG tablet Take 10 mg by mouth at bedtime.  . Semaglutide,0.25 or 0.5MG/DOS, (OZEMPIC, 0.25 OR 0.5 MG/DOSE,) 2 MG/1.5ML SOPN Inject 1 mg into the skin once a week.     Allergies:   Amoxicillin-pot clavulanate, Ertugliflozin, and Levaquin [levofloxacin]   Social History   Socioeconomic History  . Marital status: Single    Spouse name: Not on file  . Number of children: 0  . Years of education: Not on file  . Highest education level: Not on file  Occupational History  . Occupation: retired    Fish farm manager: [employer]  Tobacco Use  . Smoking status: Former Smoker    Packs/day: 2.00    Years: 49.00    Pack years: 98.00    Types: Cigarettes    Quit date: 11/11/2005    Years since quitting: 14.3  . Smokeless tobacco: Never Used  Substance and Sexual Activity  . Alcohol use: Yes    Alcohol/week: 0.0  standard drinks    Comment: beer, whiskey, margherita  . Drug use: No  . Sexual activity: Not Currently  Other Topics Concern  . Not on file  Social History Narrative   Retired - Chief Operating Officer   Lives alone   Single   Social Determinants of Health   Financial Resource Strain:   . Difficulty of Paying Living Expenses:   Food Insecurity:   . Worried About Charity fundraiser in the Last Year:   . Arboriculturist in the Last Year:   Transportation Needs:   . Film/video editor (Medical):   Marland Kitchen Lack of Transportation (Non-Medical):   Physical Activity:   . Days of Exercise per Week:   . Minutes of Exercise per Session:   Stress:   . Feeling of Stress :   Social Connections:   . Frequency of Communication with Friends and Family:   . Frequency of Social Gatherings with Friends and Family:   . Attends Religious Services:   . Active Member of Clubs or Organizations:   . Attends Archivist Meetings:   Marland Kitchen Marital Status:      Family History: The patient's family history includes Diabetes in her father, maternal aunt, and sister; Heart disease in her father and mother; Liver cancer in her sister; Pancreatic cancer in her sister; Stroke in her mother.  ROS:   Please see the history of present illness.    All other systems reviewed and are negative.  EKGs/Labs/Other Studies Reviewed:    The following studies were reviewed today: I discussed my findings with the patient at length.  Results from previous echocardiogram and stress test were noted and discussed   Recent Labs: No results found for requested labs within last 8760 hours.  Recent Lipid Panel    Component Value Date/Time   CHOL 228 (H) 12/12/2016 0853   TRIG 114.0 12/12/2016 0853   HDL 52.60 12/12/2016 0853   CHOLHDL 4 12/12/2016 0853   VLDL 22.8 12/12/2016 0853   LDLCALC 153 (H) 12/12/2016 1448  LDLDIRECT 160.1 07/12/2014 1423    Physical Exam:    VS:  BP (!) 142/68   Pulse 80    Temp (!) 97.2 F (36.2 C)   Ht '5\' 2"'  (1.575 m)   Wt 166 lb (75.3 kg)   SpO2 96%   BMI 30.36 kg/m     Wt Readings from Last 3 Encounters:  03/28/20 166 lb (75.3 kg)  08/27/19 164 lb (74.4 kg)  08/10/19 164 lb (74.4 kg)     GEN: Patient is in no acute distress HEENT: Normal NECK: No JVD; No carotid bruits LYMPHATICS: No lymphadenopathy CARDIAC: Hear sounds regular, 2/6 systolic murmur at the apex. RESPIRATORY:  Clear to auscultation without rales, wheezing or rhonchi  ABDOMEN: Soft, non-tender, non-distended MUSCULOSKELETAL:  No edema; No deformity  SKIN: Warm and dry NEUROLOGIC:  Alert and oriented x 3 PSYCHIATRIC:  Normal affect   Signed, Jenean Lindau, MD  03/28/2020 9:11 AM    [facility]

## 2020-03-28 NOTE — Patient Instructions (Signed)
Medication Instructions:  No medication changes. *If you need a refill on your cardiac medications before your next appointment, please call your pharmacy*   Lab Work: None ordered If you have labs (blood work) drawn today and your tests are completely normal, you will receive your results only by: . MyChart Message (if you have MyChart) OR . A paper copy in the mail If you have any lab test that is abnormal or we need to change your treatment, we will call you to review the results.   Testing/Procedures: Your physician has requested that you have an echocardiogram. Echocardiography is a painless test that uses sound waves to create images of your heart. It provides your doctor with information about the size and shape of your heart and how well your heart's chambers and valves are working. This procedure takes approximately one hour. There are no restrictions for this procedure.     Follow-Up: At [facility], you and your health needs are our priority.  As part of our continuing mission to provide you with exceptional heart care, we have created designated Provider Care Teams.  These Care Teams include your primary Cardiologist (physician) and Advanced Practice Providers (APPs -  Physician Assistants and Nurse Practitioners) who all work together to provide you with the care you need, when you need it.  We recommend signing up for the patient portal called "MyChart".  Sign up information is provided on this After Visit Summary.  MyChart is used to connect with patients for Virtual Visits (Telemedicine).  Patients are able to view lab/test results, encounter notes, upcoming appointments, etc.  Non-urgent messages can be sent to your provider as well.   To learn more about what you can do with MyChart, go to [email or web address].    Your next appointment:   6 month(s)  The format for your next appointment:   In Person  Provider:   Rajan Revankar, MD   Other  Instructions  Echocardiogram An echocardiogram is a procedure that uses painless sound waves (ultrasound) to produce an image of the heart. Images from an echocardiogram can provide important information about:  Signs of coronary artery disease (CAD).  Aneurysm detection. An aneurysm is a weak or damaged part of an artery wall that bulges out from the normal force of blood pumping through the body.  Heart size and shape. Changes in the size or shape of the heart can be associated with certain conditions, including heart failure, aneurysm, and CAD.  Heart muscle function.  Heart valve function.  Signs of a past heart attack.  Fluid buildup around the heart.  Thickening of the heart muscle.  A tumor or infectious growth around the heart valves. Tell a health care provider about:  Any allergies you have.  All medicines you are taking, including vitamins, herbs, eye drops, creams, and over-the-counter medicines.  Any blood disorders you have.  Any surgeries you have had.  Any medical conditions you have.  Whether you are pregnant or may be pregnant. What are the risks? Generally, this is a safe procedure. However, problems may occur, including:  Allergic reaction to dye (contrast) that may be used during the procedure. What happens before the procedure? No specific preparation is needed. You may eat and drink normally. What happens during the procedure?   An IV tube may be inserted into one of your veins.  You may receive contrast through this tube. A contrast is an injection that improves the quality of the pictures from your heart.  A   gel will be applied to your chest.  A wand-like tool (transducer) will be moved over your chest. The gel will help to transmit the sound waves from the transducer.  The sound waves will harmlessly bounce off of your heart to allow the heart images to be captured in real-time motion. The images will be recorded on a computer. The  procedure may vary among health care providers and hospitals. What happens after the procedure?  You may return to your normal, everyday life, including diet, activities, and medicines, unless your health care provider tells you not to do that. Summary  An echocardiogram is a procedure that uses painless sound waves (ultrasound) to produce an image of the heart.  Images from an echocardiogram can provide important information about the size and shape of your heart, heart muscle function, heart valve function, and fluid buildup around your heart.  You do not need to do anything to prepare before this procedure. You may eat and drink normally.  After the echocardiogram is completed, you may return to your normal, everyday life, unless your health care provider tells you not to do that. This information is not intended to replace advice given to you by your health care provider. Make sure you discuss any questions you have with your health care provider. Document Revised: 02/18/2019 Document Reviewed: 11/30/2016 Elsevier Patient Education  2020 Elsevier Inc.   

## 2020-03-30 DIAGNOSIS — J439 Emphysema, unspecified: Secondary | ICD-10-CM | POA: Diagnosis not present

## 2020-03-30 DIAGNOSIS — J90 Pleural effusion, not elsewhere classified: Secondary | ICD-10-CM | POA: Diagnosis not present

## 2020-04-10 DIAGNOSIS — J449 Chronic obstructive pulmonary disease, unspecified: Secondary | ICD-10-CM | POA: Diagnosis not present

## 2020-04-10 DIAGNOSIS — I110 Hypertensive heart disease with heart failure: Secondary | ICD-10-CM | POA: Diagnosis not present

## 2020-04-10 DIAGNOSIS — I5030 Unspecified diastolic (congestive) heart failure: Secondary | ICD-10-CM | POA: Diagnosis not present

## 2020-04-10 DIAGNOSIS — E119 Type 2 diabetes mellitus without complications: Secondary | ICD-10-CM | POA: Diagnosis not present

## 2020-04-13 ENCOUNTER — Other Ambulatory Visit: Payer: PPO

## 2020-04-14 ENCOUNTER — Other Ambulatory Visit: Payer: Self-pay

## 2020-04-14 ENCOUNTER — Ambulatory Visit (INDEPENDENT_AMBULATORY_CARE_PROVIDER_SITE_OTHER): Payer: PPO

## 2020-04-14 DIAGNOSIS — I313 Pericardial effusion (noninflammatory): Secondary | ICD-10-CM

## 2020-04-14 NOTE — Progress Notes (Signed)
Complete echocardiogram performed.  Jimmy Maliek Schellhorn RDCS, RVT  

## 2020-05-08 DIAGNOSIS — J309 Allergic rhinitis, unspecified: Secondary | ICD-10-CM | POA: Diagnosis not present

## 2020-05-08 DIAGNOSIS — J449 Chronic obstructive pulmonary disease, unspecified: Secondary | ICD-10-CM | POA: Diagnosis not present

## 2020-05-08 DIAGNOSIS — E669 Obesity, unspecified: Secondary | ICD-10-CM | POA: Diagnosis not present

## 2020-05-08 DIAGNOSIS — N183 Chronic kidney disease, stage 3 unspecified: Secondary | ICD-10-CM | POA: Diagnosis not present

## 2020-05-08 DIAGNOSIS — I5030 Unspecified diastolic (congestive) heart failure: Secondary | ICD-10-CM | POA: Diagnosis not present

## 2020-05-08 DIAGNOSIS — R0982 Postnasal drip: Secondary | ICD-10-CM | POA: Diagnosis not present

## 2020-05-08 DIAGNOSIS — E119 Type 2 diabetes mellitus without complications: Secondary | ICD-10-CM | POA: Diagnosis not present

## 2020-05-08 DIAGNOSIS — D509 Iron deficiency anemia, unspecified: Secondary | ICD-10-CM | POA: Diagnosis not present

## 2020-05-08 DIAGNOSIS — E1122 Type 2 diabetes mellitus with diabetic chronic kidney disease: Secondary | ICD-10-CM | POA: Diagnosis not present

## 2020-05-08 DIAGNOSIS — Z794 Long term (current) use of insulin: Secondary | ICD-10-CM | POA: Diagnosis not present

## 2020-05-08 DIAGNOSIS — K219 Gastro-esophageal reflux disease without esophagitis: Secondary | ICD-10-CM | POA: Diagnosis not present

## 2020-05-08 DIAGNOSIS — Z0000 Encounter for general adult medical examination without abnormal findings: Secondary | ICD-10-CM | POA: Diagnosis not present

## 2020-05-10 DIAGNOSIS — J449 Chronic obstructive pulmonary disease, unspecified: Secondary | ICD-10-CM | POA: Diagnosis not present

## 2020-05-10 DIAGNOSIS — E119 Type 2 diabetes mellitus without complications: Secondary | ICD-10-CM | POA: Diagnosis not present

## 2020-05-10 DIAGNOSIS — I5030 Unspecified diastolic (congestive) heart failure: Secondary | ICD-10-CM | POA: Diagnosis not present

## 2020-05-10 DIAGNOSIS — I110 Hypertensive heart disease with heart failure: Secondary | ICD-10-CM | POA: Diagnosis not present

## 2020-05-23 DIAGNOSIS — M79644 Pain in right finger(s): Secondary | ICD-10-CM | POA: Diagnosis not present

## 2020-05-23 DIAGNOSIS — Z6828 Body mass index (BMI) 28.0-28.9, adult: Secondary | ICD-10-CM | POA: Diagnosis not present

## 2020-05-23 DIAGNOSIS — M25531 Pain in right wrist: Secondary | ICD-10-CM | POA: Diagnosis not present

## 2020-05-23 DIAGNOSIS — M79641 Pain in right hand: Secondary | ICD-10-CM | POA: Diagnosis not present

## 2020-05-23 DIAGNOSIS — M152 Bouchard's nodes (with arthropathy): Secondary | ICD-10-CM | POA: Diagnosis not present

## 2020-05-30 DIAGNOSIS — J4540 Moderate persistent asthma, uncomplicated: Secondary | ICD-10-CM | POA: Diagnosis not present

## 2020-05-30 DIAGNOSIS — G4761 Periodic limb movement disorder: Secondary | ICD-10-CM | POA: Diagnosis not present

## 2020-05-30 DIAGNOSIS — R5383 Other fatigue: Secondary | ICD-10-CM | POA: Diagnosis not present

## 2020-05-30 DIAGNOSIS — R05 Cough: Secondary | ICD-10-CM | POA: Diagnosis not present

## 2020-06-10 DIAGNOSIS — I110 Hypertensive heart disease with heart failure: Secondary | ICD-10-CM | POA: Diagnosis not present

## 2020-06-10 DIAGNOSIS — E119 Type 2 diabetes mellitus without complications: Secondary | ICD-10-CM | POA: Diagnosis not present

## 2020-06-10 DIAGNOSIS — J449 Chronic obstructive pulmonary disease, unspecified: Secondary | ICD-10-CM | POA: Diagnosis not present

## 2020-06-10 DIAGNOSIS — I5030 Unspecified diastolic (congestive) heart failure: Secondary | ICD-10-CM | POA: Diagnosis not present

## 2020-06-12 DIAGNOSIS — R52 Pain, unspecified: Secondary | ICD-10-CM | POA: Diagnosis not present

## 2020-06-12 DIAGNOSIS — M65341 Trigger finger, right ring finger: Secondary | ICD-10-CM | POA: Diagnosis not present

## 2020-06-15 DIAGNOSIS — D509 Iron deficiency anemia, unspecified: Secondary | ICD-10-CM | POA: Diagnosis not present

## 2020-06-21 DIAGNOSIS — H02831 Dermatochalasis of right upper eyelid: Secondary | ICD-10-CM | POA: Diagnosis not present

## 2020-06-21 DIAGNOSIS — H26492 Other secondary cataract, left eye: Secondary | ICD-10-CM | POA: Diagnosis not present

## 2020-06-21 DIAGNOSIS — H524 Presbyopia: Secondary | ICD-10-CM | POA: Diagnosis not present

## 2020-06-21 DIAGNOSIS — E119 Type 2 diabetes mellitus without complications: Secondary | ICD-10-CM | POA: Diagnosis not present

## 2020-06-21 DIAGNOSIS — Z961 Presence of intraocular lens: Secondary | ICD-10-CM | POA: Diagnosis not present

## 2020-06-21 DIAGNOSIS — H52223 Regular astigmatism, bilateral: Secondary | ICD-10-CM | POA: Diagnosis not present

## 2020-06-26 DIAGNOSIS — E119 Type 2 diabetes mellitus without complications: Secondary | ICD-10-CM | POA: Diagnosis not present

## 2020-06-26 DIAGNOSIS — I5030 Unspecified diastolic (congestive) heart failure: Secondary | ICD-10-CM | POA: Diagnosis not present

## 2020-06-26 DIAGNOSIS — E1122 Type 2 diabetes mellitus with diabetic chronic kidney disease: Secondary | ICD-10-CM | POA: Diagnosis not present

## 2020-06-26 DIAGNOSIS — E669 Obesity, unspecified: Secondary | ICD-10-CM | POA: Diagnosis not present

## 2020-06-26 DIAGNOSIS — J449 Chronic obstructive pulmonary disease, unspecified: Secondary | ICD-10-CM | POA: Diagnosis not present

## 2020-06-26 DIAGNOSIS — N183 Chronic kidney disease, stage 3 unspecified: Secondary | ICD-10-CM | POA: Diagnosis not present

## 2020-06-26 DIAGNOSIS — Z6828 Body mass index (BMI) 28.0-28.9, adult: Secondary | ICD-10-CM | POA: Diagnosis not present

## 2020-06-26 DIAGNOSIS — Z794 Long term (current) use of insulin: Secondary | ICD-10-CM | POA: Diagnosis not present

## 2020-07-11 DIAGNOSIS — I5030 Unspecified diastolic (congestive) heart failure: Secondary | ICD-10-CM | POA: Diagnosis not present

## 2020-07-11 DIAGNOSIS — I110 Hypertensive heart disease with heart failure: Secondary | ICD-10-CM | POA: Diagnosis not present

## 2020-07-11 DIAGNOSIS — J449 Chronic obstructive pulmonary disease, unspecified: Secondary | ICD-10-CM | POA: Diagnosis not present

## 2020-07-11 DIAGNOSIS — E119 Type 2 diabetes mellitus without complications: Secondary | ICD-10-CM | POA: Diagnosis not present

## 2020-08-10 DIAGNOSIS — E119 Type 2 diabetes mellitus without complications: Secondary | ICD-10-CM | POA: Diagnosis not present

## 2020-08-10 DIAGNOSIS — I5030 Unspecified diastolic (congestive) heart failure: Secondary | ICD-10-CM | POA: Diagnosis not present

## 2020-08-10 DIAGNOSIS — I110 Hypertensive heart disease with heart failure: Secondary | ICD-10-CM | POA: Diagnosis not present

## 2020-08-10 DIAGNOSIS — J449 Chronic obstructive pulmonary disease, unspecified: Secondary | ICD-10-CM | POA: Diagnosis not present

## 2020-09-09 DIAGNOSIS — J449 Chronic obstructive pulmonary disease, unspecified: Secondary | ICD-10-CM | POA: Diagnosis not present

## 2020-09-09 DIAGNOSIS — I5030 Unspecified diastolic (congestive) heart failure: Secondary | ICD-10-CM | POA: Diagnosis not present

## 2020-09-09 DIAGNOSIS — I110 Hypertensive heart disease with heart failure: Secondary | ICD-10-CM | POA: Diagnosis not present

## 2020-09-09 DIAGNOSIS — E119 Type 2 diabetes mellitus without complications: Secondary | ICD-10-CM | POA: Diagnosis not present

## 2020-09-26 DIAGNOSIS — K219 Gastro-esophageal reflux disease without esophagitis: Secondary | ICD-10-CM | POA: Diagnosis not present

## 2020-09-26 DIAGNOSIS — E669 Obesity, unspecified: Secondary | ICD-10-CM | POA: Diagnosis not present

## 2020-09-26 DIAGNOSIS — Z6829 Body mass index (BMI) 29.0-29.9, adult: Secondary | ICD-10-CM | POA: Diagnosis not present

## 2020-09-26 DIAGNOSIS — Z794 Long term (current) use of insulin: Secondary | ICD-10-CM | POA: Diagnosis not present

## 2020-09-26 DIAGNOSIS — E119 Type 2 diabetes mellitus without complications: Secondary | ICD-10-CM | POA: Diagnosis not present

## 2020-09-26 DIAGNOSIS — J449 Chronic obstructive pulmonary disease, unspecified: Secondary | ICD-10-CM | POA: Diagnosis not present

## 2020-10-04 DIAGNOSIS — J301 Allergic rhinitis due to pollen: Secondary | ICD-10-CM | POA: Diagnosis not present

## 2020-10-04 DIAGNOSIS — J4540 Moderate persistent asthma, uncomplicated: Secondary | ICD-10-CM | POA: Diagnosis not present

## 2020-10-04 DIAGNOSIS — R059 Cough, unspecified: Secondary | ICD-10-CM | POA: Diagnosis not present

## 2020-10-04 DIAGNOSIS — G4761 Periodic limb movement disorder: Secondary | ICD-10-CM | POA: Diagnosis not present

## 2020-10-04 DIAGNOSIS — R5383 Other fatigue: Secondary | ICD-10-CM | POA: Diagnosis not present

## 2020-10-10 DIAGNOSIS — I110 Hypertensive heart disease with heart failure: Secondary | ICD-10-CM | POA: Diagnosis not present

## 2020-10-10 DIAGNOSIS — I5030 Unspecified diastolic (congestive) heart failure: Secondary | ICD-10-CM | POA: Diagnosis not present

## 2020-10-10 DIAGNOSIS — J449 Chronic obstructive pulmonary disease, unspecified: Secondary | ICD-10-CM | POA: Diagnosis not present

## 2020-10-10 DIAGNOSIS — E119 Type 2 diabetes mellitus without complications: Secondary | ICD-10-CM | POA: Diagnosis not present

## 2020-10-30 DIAGNOSIS — Z23 Encounter for immunization: Secondary | ICD-10-CM | POA: Diagnosis not present

## 2020-11-09 DIAGNOSIS — J449 Chronic obstructive pulmonary disease, unspecified: Secondary | ICD-10-CM | POA: Diagnosis not present

## 2020-11-09 DIAGNOSIS — E119 Type 2 diabetes mellitus without complications: Secondary | ICD-10-CM | POA: Diagnosis not present

## 2020-11-09 DIAGNOSIS — I5030 Unspecified diastolic (congestive) heart failure: Secondary | ICD-10-CM | POA: Diagnosis not present

## 2020-11-09 DIAGNOSIS — I110 Hypertensive heart disease with heart failure: Secondary | ICD-10-CM | POA: Diagnosis not present

## 2020-12-10 DIAGNOSIS — J449 Chronic obstructive pulmonary disease, unspecified: Secondary | ICD-10-CM | POA: Diagnosis not present

## 2020-12-10 DIAGNOSIS — I110 Hypertensive heart disease with heart failure: Secondary | ICD-10-CM | POA: Diagnosis not present

## 2020-12-10 DIAGNOSIS — I5030 Unspecified diastolic (congestive) heart failure: Secondary | ICD-10-CM | POA: Diagnosis not present

## 2020-12-10 DIAGNOSIS — E119 Type 2 diabetes mellitus without complications: Secondary | ICD-10-CM | POA: Diagnosis not present

## 2020-12-11 DIAGNOSIS — R509 Fever, unspecified: Secondary | ICD-10-CM | POA: Diagnosis not present

## 2020-12-11 DIAGNOSIS — G253 Myoclonus: Secondary | ICD-10-CM | POA: Diagnosis not present

## 2020-12-11 DIAGNOSIS — Z20828 Contact with and (suspected) exposure to other viral communicable diseases: Secondary | ICD-10-CM | POA: Diagnosis not present

## 2020-12-11 DIAGNOSIS — R051 Acute cough: Secondary | ICD-10-CM | POA: Diagnosis not present

## 2020-12-11 DIAGNOSIS — G2581 Restless legs syndrome: Secondary | ICD-10-CM | POA: Diagnosis not present

## 2020-12-11 DIAGNOSIS — J441 Chronic obstructive pulmonary disease with (acute) exacerbation: Secondary | ICD-10-CM | POA: Diagnosis not present

## 2020-12-27 DIAGNOSIS — Z794 Long term (current) use of insulin: Secondary | ICD-10-CM | POA: Diagnosis not present

## 2020-12-27 DIAGNOSIS — R112 Nausea with vomiting, unspecified: Secondary | ICD-10-CM | POA: Diagnosis not present

## 2020-12-27 DIAGNOSIS — J449 Chronic obstructive pulmonary disease, unspecified: Secondary | ICD-10-CM | POA: Diagnosis not present

## 2020-12-27 DIAGNOSIS — E119 Type 2 diabetes mellitus without complications: Secondary | ICD-10-CM | POA: Diagnosis not present

## 2021-01-08 DIAGNOSIS — J449 Chronic obstructive pulmonary disease, unspecified: Secondary | ICD-10-CM | POA: Diagnosis not present

## 2021-01-08 DIAGNOSIS — N200 Calculus of kidney: Secondary | ICD-10-CM | POA: Insufficient documentation

## 2021-01-08 DIAGNOSIS — R0602 Shortness of breath: Secondary | ICD-10-CM | POA: Insufficient documentation

## 2021-01-08 DIAGNOSIS — F32A Depression, unspecified: Secondary | ICD-10-CM | POA: Insufficient documentation

## 2021-01-08 DIAGNOSIS — I110 Hypertensive heart disease with heart failure: Secondary | ICD-10-CM | POA: Diagnosis not present

## 2021-01-08 DIAGNOSIS — I5030 Unspecified diastolic (congestive) heart failure: Secondary | ICD-10-CM | POA: Diagnosis not present

## 2021-01-08 DIAGNOSIS — E119 Type 2 diabetes mellitus without complications: Secondary | ICD-10-CM | POA: Diagnosis not present

## 2021-01-09 ENCOUNTER — Other Ambulatory Visit: Payer: Self-pay

## 2021-01-09 ENCOUNTER — Ambulatory Visit: Payer: HMO | Admitting: Cardiology

## 2021-01-09 ENCOUNTER — Encounter: Payer: Self-pay | Admitting: Cardiology

## 2021-01-09 VITALS — BP 138/60 | HR 97 | Ht 62.0 in | Wt 170.0 lb

## 2021-01-09 DIAGNOSIS — E088 Diabetes mellitus due to underlying condition with unspecified complications: Secondary | ICD-10-CM

## 2021-01-09 DIAGNOSIS — E119 Type 2 diabetes mellitus without complications: Secondary | ICD-10-CM | POA: Diagnosis not present

## 2021-01-09 DIAGNOSIS — I313 Pericardial effusion (noninflammatory): Secondary | ICD-10-CM | POA: Diagnosis not present

## 2021-01-09 DIAGNOSIS — I3139 Other pericardial effusion (noninflammatory): Secondary | ICD-10-CM

## 2021-01-09 DIAGNOSIS — E782 Mixed hyperlipidemia: Secondary | ICD-10-CM

## 2021-01-09 DIAGNOSIS — I5030 Unspecified diastolic (congestive) heart failure: Secondary | ICD-10-CM | POA: Diagnosis not present

## 2021-01-09 DIAGNOSIS — J441 Chronic obstructive pulmonary disease with (acute) exacerbation: Secondary | ICD-10-CM | POA: Diagnosis not present

## 2021-01-09 DIAGNOSIS — J189 Pneumonia, unspecified organism: Secondary | ICD-10-CM | POA: Diagnosis not present

## 2021-01-09 DIAGNOSIS — R0602 Shortness of breath: Secondary | ICD-10-CM | POA: Diagnosis not present

## 2021-01-09 DIAGNOSIS — Z794 Long term (current) use of insulin: Secondary | ICD-10-CM | POA: Diagnosis not present

## 2021-01-09 NOTE — Progress Notes (Signed)
Cardiology Office Note:    Date:  01/09/2021   ID:  Pam Gamble, DOB Jul 01, 1946, MRN [id number]  PCP:  Pam Grammes, MD  Cardiologist:  Pam Lindau, MD   Referring MD: Pam Grammes, MD    ASSESSMENT:    1. Pericardial effusion   2. Diabetes mellitus due to underlying condition with unspecified complications ([place])   3. Mixed dyslipidemia    PLAN:    In order of problems listed above:  1. Primary prevention stressed with patient.  Importance of compliance with diet medication stressed and she vocalized understanding. 2. History of pericardial effusion: Echocardiogram in the past has revealed pericardial effusion which has been stable we will repeat an echocardiogram to see his progress.  Clinically she is asymptomatic.  She has stable shortness of breath on exertion and she has COPD for which she is being treated.  She also is recovering from pneumonitis as mentioned below. 3. Diabetes mellitus: Diet was emphasized.  Weight reduction stressed.  I told her to walk to the best of her ability on a regular basis and she promises to do so.  This is followed by primary care provider. 4. Mixed dyslipidemia: On statin therapy.  Diet emphasized.  Lipids followed by primary care physician.  I reviewed blood work report from [employer] 5. Patient will be seen in follow-up appointment in 6 months or earlier if the patient has any concerns    Medication Adjustments/Labs and Tests Ordered: Current medicines are reviewed at length with the patient today.  Concerns regarding medicines are outlined above.  No orders of the defined types were placed in this encounter.  No orders of the defined types were placed in this encounter.    No chief complaint on file.    History of Present Illness:    Pam Gamble is a 75 y.o. female.  Patient has past medical history of pericardial effusion, diabetes mellitus and mixed dyslipidemia.  She denies any problems at this time and  takes care of activities of daily living.  No chest pain orthopnea or PND.  She has recently recovering from a non-Covid infection and pneumonitis.  She has COPD and shortness of breath on exertion which has been stable for the past several months.  At the time of my evaluation, the patient is alert awake oriented and in no distress.  Past Medical History:  Diagnosis Date  . Abdominal pain, unspecified site 07/12/2014   8/15 lower R>L ? etiol - r/o pyelo and others   . Acute biliary pancreatitis 10/01/2018  . Acute cholangitis 10/01/2018  . Acute non-recurrent maxillary sinusitis 01/12/2011   Qualifier: Diagnosis of  By: Madilyn Fireman MD, Barnetta Chapel    . Allergic rhinitis 01/08/2008   Chronic    . Atrophic vaginitis 05/03/2015   6/16   . Barrett's esophagus   . C O P D WITH ACUTE EXACERBATION 03/21/2010   9/13, 11/14, 8/18  [name], Leanna Sato.  Potential benefits of a short/long term steroid  use as well as potential risks  and complications were explained to the patient and were aknowledged.     . Calcium deposits in tendon and bursa   . CAROTID BRUIT 02/09/2008   Qualifier: Diagnosis of  By: Plotnikov MD, Evie Lacks   . Chest discomfort 06/22/2019  . Chronic airway obstruction, not elsewhere classified   . COPD (chronic obstructive pulmonary disease) ([place])   . COPD, frequent exacerbations ([place]) 09/15/2007   Dr Vaughan Browner Chronic, severe Advair Albuterol HHN prn  Steroids - frequent use  Pt sleeps in a recliner since 2012   . Cough 05/23/2012  . Depression   . Diabetes mellitus 2008ish   type two  . Diabetes mellitus due to underlying condition with unspecified complications ([place]) 02/26/4080  . Diabetes type 2, uncontrolled ([place]) 12/27/2013   2/15 2/17 Levemir - too $$$. Started Toujeo (10/17) samples Continue with current prescription therapy as reflected on the Med list - Levemir. Risks associated with treatment noncompliance were discussed. Compliance was encouraged. 2018 Metformin and NPH insulin 5/18 Use  NPH insulin 40 u in am and 10 u at night. Titrate up by 1 unit a day each (AM and HS doses) for goal sugars of 100-130 up to 60   . Dyspnea on exertion 06/22/2019  . Edema 09/09/2014   10/15 B LE   . Esophageal reflux   . Ex-smoker 06/22/2019  . Fever, unspecified 07/12/2014   8/15 mild T99F   . HYSTERECTOMY, PARTIAL, HX OF 09/26/2007   Qualifier: Diagnosis of  By: Danny Lawless CMA, [place]    . Idiopathic thrombocythemia   . Kidney stone 07/27/2014   2014 Dr Janice Norrie  . Kidney stones   . Lateral epicondylitis  of elbow   . Leg cramp 04/15/2014    Albuterol can give cramps   . Low back pain 04/25/2017   Spinal stenosis  . Major depressive disorder, recurrent episode ([place]) 06/14/2009   Chronic     . Mixed dyslipidemia 03/28/2020  . Noncompliance 09/10/2016   Recurrent   . Pericardial effusion 06/22/2019  . Personal history of malignant neoplasm of other parts of uterus   . Personal history of other diseases of digestive system   . 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 Pyelonephritis, acute 02/26/2011   2012, 2015 L   . Rotator cuff arthropathy 03/21/2015   Injection 03/21/2015   . Shortness of breath    upon exertion  . Tick bite 01/18/2017  . TOBACCO USE, QUIT 01/10/2010   Qualifier: Diagnosis of  By: Doralee Albino      Past Surgical History:  Procedure Laterality Date  . ABDOMINAL HYSTERECTOMY    . CATARACT EXTRACTION Bilateral 01/2016 and 03/2016  . CHOLECYSTECTOMY N/A 10/03/2018   Procedure: LAPAROSCOPIC CHOLECYSTECTOMY WITH INTRAOPERATIVE CHOLANGIOGRAM;  Surgeon: Alphonsa Overall, MD;  Location: [facility];  Service: General;  Laterality: N/A;  . LITHOTRIPSY    . REFRACTIVE SURGERY Right 05/16/2016  . SPLENECTOMY  1999  . TONSILLECTOMY AND ADENOIDECTOMY      Current Medications: Current Meds  Medication Sig  . albuterol (PROVENTIL HFA;VENTOLIN HFA) 108 (90 Base) MCG/ACT inhaler Inhale 2 puffs into the lungs every 6 (six) hours as needed for wheezing or shortness of  breath.  Marland Kitchen aspirin EC 81 MG tablet Take 81 mg by mouth daily.  . Azelastine HCl 137 MCG/SPRAY SOLN Place 2 sprays into both nostrils 2 (two) times daily.  . Cholecalciferol (VITAMIN D3) 25 MCG (1000 UT) CAPS Take 1,000 Units by mouth daily.  . Fluticasone-Umeclidin-Vilant (TRELEGY ELLIPTA) 100-62.5-25 MCG/INH AEPB Inhale 1 puff into the lungs daily.  . furosemide (LASIX) 20 MG tablet Take 10 mg by mouth daily.  Marland Kitchen lisinopril (ZESTRIL) 10 MG tablet Take 10 mg by mouth daily.  Marland Kitchen omeprazole (PRILOSEC) 40 MG capsule Take 40 mg by mouth 2 (two) times daily.  Marland Kitchen rOPINIRole (REQUIP) 2 MG tablet Take 2 mg by mouth at bedtime.  . rosuvastatin (CRESTOR) 10 MG tablet Take 10 mg by mouth at  bedtime.  . Semaglutide,0.25 or 0.5MG /DOS, (OZEMPIC, 0.25 OR 0.5 MG/DOSE,) 2 MG/1.5ML SOPN Inject 1 mg into the skin once a week.  Tyler Aas FLEXTOUCH 100 UNIT/ML FlexTouch Pen Inject 30 Units into the skin daily.     Allergies:   Amoxicillin-pot clavulanate, Levaquin [levofloxacin], and Ertugliflozin   Social History   Socioeconomic History  . Marital status: Single    Spouse name: Not on file  . Number of children: 0  . Years of education: Not on file  . Highest education level: Not on file  Occupational History  . Occupation: retired    Fish farm manager: [employer]  Tobacco Use  . Smoking status: Former Smoker    Packs/day: 2.00    Years: 49.00    Pack years: 98.00    Types: Cigarettes    Quit date: 11/11/2005    Years since quitting: 15.1  . Smokeless tobacco: Never Used  Vaping Use  . Vaping Use: Never used  Substance and Sexual Activity  . Alcohol use: Yes    Alcohol/week: 0.0 standard drinks    Comment: beer, whiskey, margherita  . Drug use: No  . Sexual activity: Not Currently  Other Topics Concern  . Not on file  Social History Narrative   Retired - Chief Operating Officer   Lives alone   Single   Social Determinants of Health   Financial Resource Strain: Not on file  Food  Insecurity: Not on file  Transportation Needs: Not on file  Physical Activity: Not on file  Stress: Not on file  Social Connections: Not on file     Family History: The patient's family history includes Diabetes in her father, maternal aunt, and sister; Heart disease in her father and mother; Liver cancer in her sister; Pancreatic cancer in her sister; Stroke in her mother.  ROS:   Please see the history of present illness.    All other systems reviewed and are negative.  EKGs/Labs/Other Studies Reviewed:    The following studies were reviewed today: I discussed my findings with the patient at length.  EKG reveals sinus rhythm and nonspecific ST-T changes   Recent Labs: No results found for requested labs within last 8760 hours.  Recent Lipid Panel    Component Value Date/Time   CHOL 228 (H) 12/12/2016 0853   TRIG 114.0 12/12/2016 0853   HDL 52.60 12/12/2016 0853   CHOLHDL 4 12/12/2016 0853   VLDL 22.8 12/12/2016 0853   LDLCALC 153 (H) 12/12/2016 0853   LDLDIRECT 160.1 07/12/2014 1423    Physical Exam:    VS:  BP 138/60   Pulse 97   Ht 5\' 2"  (1.575 m)   Wt 170 lb (77.1 kg)   SpO2 93%   BMI 31.09 kg/m     Wt Readings from Last 3 Encounters:  01/09/21 170 lb (77.1 kg)  03/28/20 166 lb (75.3 kg)  08/27/19 164 lb (74.4 kg)     GEN: Patient is in no acute distress HEENT: Normal NECK: No JVD; No carotid bruits LYMPHATICS: No lymphadenopathy CARDIAC: Hear sounds regular, 2/6 systolic murmur at the apex. RESPIRATORY:  Clear to auscultation without rales, wheezing or rhonchi  ABDOMEN: Soft, non-tender, non-distended MUSCULOSKELETAL:  No edema; No deformity  SKIN: Warm and dry NEUROLOGIC:  Alert and oriented x 3 PSYCHIATRIC:  Normal affect   Signed, Pam Lindau, MD  01/09/2021 3:25 PM    [facility]

## 2021-01-09 NOTE — Patient Instructions (Signed)
Medication Instructions:  No medication changes. *If you need a refill on your cardiac medications before your next appointment, please call your pharmacy*   Lab Work: None ordered If you have labs (blood work) drawn today and your tests are completely normal, you will receive your results only by: MyChart Message (if you have MyChart) OR A paper copy in the mail If you have any lab test that is abnormal or we need to change your treatment, we will call you to review the results.   Testing/Procedures: Your physician has requested that you have an echocardiogram. Echocardiography is a painless test that uses sound waves to create images of your heart. It provides your doctor with information about the size and shape of your heart and how well your heart's chambers and valves are working. This procedure takes approximately one hour. There are no restrictions for this procedure.    Follow-Up: At [facility], you and your health needs are our priority.  As part of our continuing mission to provide you with exceptional heart care, we have created designated Provider Care Teams.  These Care Teams include your primary Cardiologist (physician) and Advanced Practice Providers (APPs -  Physician Assistants and Nurse Practitioners) who all work together to provide you with the care you need, when you need it.  We recommend signing up for the patient portal called "MyChart".  Sign up information is provided on this After Visit Summary.  MyChart is used to connect with patients for Virtual Visits (Telemedicine).  Patients are able to view lab/test results, encounter notes, upcoming appointments, etc.  Non-urgent messages can be sent to your provider as well.   To learn more about what you can do with MyChart, go to [email or web address].    Your next appointment:   6 month(s)  The format for your next appointment:   In Person  Provider:   Rajan Revankar, MD   Other  Instructions Echocardiogram An echocardiogram is a test that uses sound waves (ultrasound) to produce images of the heart. Images from an echocardiogram can provide important information about: Heart size and shape. The size and thickness and movement of your heart's walls. Heart muscle function and strength. Heart valve function or if you have stenosis. Stenosis is when the heart valves are too narrow. If blood is flowing backward through the heart valves (regurgitation). A tumor or infectious growth around the heart valves. Areas of heart muscle that are not working well because of poor blood flow or injury from a heart attack. Aneurysm detection. An aneurysm is a weak or damaged part of an artery wall. The wall bulges out from the normal force of blood pumping through the body. Tell a health care provider about: Any allergies you have. All medicines you are taking, including vitamins, herbs, eye drops, creams, and over-the-counter medicines. Any blood disorders you have. Any surgeries you have had. Any medical conditions you have. Whether you are pregnant or may be pregnant. What are the risks? Generally, this is a safe test. However, problems may occur, including an allergic reaction to dye (contrast) that may be used during the test. What happens before the test? No specific preparation is needed. You may eat and drink normally. What happens during the test? You will take off your clothes from the waist up and put on a hospital gown. Electrodes or electrocardiogram (ECG)patches may be placed on your chest. The electrodes or patches are then connected to a device that monitors your heart rate and rhythm. You will   lie down on a table for an ultrasound exam. A gel will be applied to your chest to help sound waves pass through your skin. A handheld device, called a transducer, will be pressed against your chest and moved over your heart. The transducer produces sound waves that travel to  your heart and bounce back (or "echo" back) to the transducer. These sound waves will be captured in real-time and changed into images of your heart that can be viewed on a video monitor. The images will be recorded on a computer and reviewed by your health care provider. You may be asked to change positions or hold your breath for a short time. This makes it easier to get different views or better views of your heart. In some cases, you may receive contrast through an IV in one of your veins. This can improve the quality of the pictures from your heart. The procedure may vary among health care providers and hospitals.   What can I expect after the test? You may return to your normal, everyday life, including diet, activities, and medicines, unless your health care provider tells you not to do that. Follow these instructions at home: It is up to you to get the results of your test. Ask your health care provider, or the department that is doing the test, when your results will be ready. Keep all follow-up visits. This is important. Summary An echocardiogram is a test that uses sound waves (ultrasound) to produce images of the heart. Images from an echocardiogram can provide important information about the size and shape of your heart, heart muscle function, heart valve function, and other possible heart problems. You do not need to do anything to prepare before this test. You may eat and drink normally. After the echocardiogram is completed, you may return to your normal, everyday life, unless your health care provider tells you not to do that. This information is not intended to replace advice given to you by your health care provider. Make sure you discuss any questions you have with your health care provider. Document Revised: 06/20/2020 Document Reviewed: 06/20/2020 Elsevier Patient Education  2021 Elsevier Inc.   

## 2021-01-18 DIAGNOSIS — Z87442 Personal history of urinary calculi: Secondary | ICD-10-CM | POA: Diagnosis not present

## 2021-01-18 DIAGNOSIS — R1032 Left lower quadrant pain: Secondary | ICD-10-CM | POA: Diagnosis not present

## 2021-01-18 DIAGNOSIS — Z79899 Other long term (current) drug therapy: Secondary | ICD-10-CM | POA: Diagnosis not present

## 2021-01-18 DIAGNOSIS — R1031 Right lower quadrant pain: Secondary | ICD-10-CM | POA: Diagnosis not present

## 2021-01-23 DIAGNOSIS — R1031 Right lower quadrant pain: Secondary | ICD-10-CM | POA: Diagnosis not present

## 2021-01-23 DIAGNOSIS — R109 Unspecified abdominal pain: Secondary | ICD-10-CM | POA: Diagnosis not present

## 2021-01-24 DIAGNOSIS — J189 Pneumonia, unspecified organism: Secondary | ICD-10-CM | POA: Diagnosis not present

## 2021-01-24 DIAGNOSIS — R0600 Dyspnea, unspecified: Secondary | ICD-10-CM | POA: Diagnosis not present

## 2021-01-24 DIAGNOSIS — J90 Pleural effusion, not elsewhere classified: Secondary | ICD-10-CM | POA: Diagnosis not present

## 2021-01-24 DIAGNOSIS — R140 Abdominal distension (gaseous): Secondary | ICD-10-CM | POA: Diagnosis not present

## 2021-01-24 DIAGNOSIS — I5030 Unspecified diastolic (congestive) heart failure: Secondary | ICD-10-CM | POA: Diagnosis not present

## 2021-01-24 DIAGNOSIS — R1012 Left upper quadrant pain: Secondary | ICD-10-CM | POA: Diagnosis not present

## 2021-01-24 DIAGNOSIS — J449 Chronic obstructive pulmonary disease, unspecified: Secondary | ICD-10-CM | POA: Diagnosis not present

## 2021-01-29 DIAGNOSIS — J432 Centrilobular emphysema: Secondary | ICD-10-CM | POA: Diagnosis not present

## 2021-01-29 DIAGNOSIS — N200 Calculus of kidney: Secondary | ICD-10-CM | POA: Diagnosis not present

## 2021-01-29 DIAGNOSIS — I700 Atherosclerosis of aorta: Secondary | ICD-10-CM | POA: Diagnosis not present

## 2021-01-29 DIAGNOSIS — J90 Pleural effusion, not elsewhere classified: Secondary | ICD-10-CM | POA: Diagnosis not present

## 2021-01-29 DIAGNOSIS — I2510 Atherosclerotic heart disease of native coronary artery without angina pectoris: Secondary | ICD-10-CM | POA: Diagnosis not present

## 2021-01-29 DIAGNOSIS — I739 Peripheral vascular disease, unspecified: Secondary | ICD-10-CM | POA: Diagnosis not present

## 2021-01-29 DIAGNOSIS — R140 Abdominal distension (gaseous): Secondary | ICD-10-CM | POA: Diagnosis not present

## 2021-02-05 DIAGNOSIS — J301 Allergic rhinitis due to pollen: Secondary | ICD-10-CM | POA: Diagnosis not present

## 2021-02-05 DIAGNOSIS — R1032 Left lower quadrant pain: Secondary | ICD-10-CM | POA: Diagnosis not present

## 2021-02-05 DIAGNOSIS — J4540 Moderate persistent asthma, uncomplicated: Secondary | ICD-10-CM | POA: Diagnosis not present

## 2021-02-05 DIAGNOSIS — N200 Calculus of kidney: Secondary | ICD-10-CM | POA: Diagnosis not present

## 2021-02-05 DIAGNOSIS — Z79899 Other long term (current) drug therapy: Secondary | ICD-10-CM | POA: Diagnosis not present

## 2021-02-05 DIAGNOSIS — R059 Cough, unspecified: Secondary | ICD-10-CM | POA: Diagnosis not present

## 2021-02-05 DIAGNOSIS — R5383 Other fatigue: Secondary | ICD-10-CM | POA: Diagnosis not present

## 2021-02-05 DIAGNOSIS — G4761 Periodic limb movement disorder: Secondary | ICD-10-CM | POA: Diagnosis not present

## 2021-02-07 DIAGNOSIS — I5030 Unspecified diastolic (congestive) heart failure: Secondary | ICD-10-CM | POA: Diagnosis not present

## 2021-02-07 DIAGNOSIS — E119 Type 2 diabetes mellitus without complications: Secondary | ICD-10-CM | POA: Diagnosis not present

## 2021-02-07 DIAGNOSIS — I110 Hypertensive heart disease with heart failure: Secondary | ICD-10-CM | POA: Diagnosis not present

## 2021-02-07 DIAGNOSIS — J449 Chronic obstructive pulmonary disease, unspecified: Secondary | ICD-10-CM | POA: Diagnosis not present

## 2021-02-08 ENCOUNTER — Ambulatory Visit (INDEPENDENT_AMBULATORY_CARE_PROVIDER_SITE_OTHER): Payer: HMO

## 2021-02-08 ENCOUNTER — Other Ambulatory Visit: Payer: Self-pay

## 2021-02-08 DIAGNOSIS — I3139 Other pericardial effusion (noninflammatory): Secondary | ICD-10-CM

## 2021-02-08 DIAGNOSIS — I313 Pericardial effusion (noninflammatory): Secondary | ICD-10-CM | POA: Diagnosis not present

## 2021-02-08 LAB — ECHOCARDIOGRAM COMPLETE
Area-P 1/2: 4.12 cm2
S' Lateral: 2.6 cm

## 2021-02-08 NOTE — Progress Notes (Signed)
Complete echocardiogram performed.  Jimmy Coltrane Tugwell RDCS, RVT  

## 2021-02-22 DIAGNOSIS — K2270 Barrett's esophagus without dysplasia: Secondary | ICD-10-CM | POA: Diagnosis not present

## 2021-02-22 DIAGNOSIS — K219 Gastro-esophageal reflux disease without esophagitis: Secondary | ICD-10-CM | POA: Diagnosis not present

## 2021-02-26 DIAGNOSIS — E119 Type 2 diabetes mellitus without complications: Secondary | ICD-10-CM | POA: Diagnosis not present

## 2021-02-26 DIAGNOSIS — Z794 Long term (current) use of insulin: Secondary | ICD-10-CM | POA: Diagnosis not present

## 2021-02-26 DIAGNOSIS — I5030 Unspecified diastolic (congestive) heart failure: Secondary | ICD-10-CM | POA: Diagnosis not present

## 2021-02-26 DIAGNOSIS — J449 Chronic obstructive pulmonary disease, unspecified: Secondary | ICD-10-CM | POA: Diagnosis not present

## 2021-02-26 DIAGNOSIS — I700 Atherosclerosis of aorta: Secondary | ICD-10-CM | POA: Diagnosis not present

## 2021-03-10 DIAGNOSIS — I110 Hypertensive heart disease with heart failure: Secondary | ICD-10-CM | POA: Diagnosis not present

## 2021-03-10 DIAGNOSIS — J449 Chronic obstructive pulmonary disease, unspecified: Secondary | ICD-10-CM | POA: Diagnosis not present

## 2021-03-10 DIAGNOSIS — E119 Type 2 diabetes mellitus without complications: Secondary | ICD-10-CM | POA: Diagnosis not present

## 2021-03-10 DIAGNOSIS — I5030 Unspecified diastolic (congestive) heart failure: Secondary | ICD-10-CM | POA: Diagnosis not present

## 2021-04-09 DIAGNOSIS — J449 Chronic obstructive pulmonary disease, unspecified: Secondary | ICD-10-CM | POA: Diagnosis not present

## 2021-04-09 DIAGNOSIS — I110 Hypertensive heart disease with heart failure: Secondary | ICD-10-CM | POA: Diagnosis not present

## 2021-04-09 DIAGNOSIS — E119 Type 2 diabetes mellitus without complications: Secondary | ICD-10-CM | POA: Diagnosis not present

## 2021-04-09 DIAGNOSIS — I5030 Unspecified diastolic (congestive) heart failure: Secondary | ICD-10-CM | POA: Diagnosis not present

## 2021-04-12 DIAGNOSIS — Z794 Long term (current) use of insulin: Secondary | ICD-10-CM | POA: Diagnosis not present

## 2021-04-12 DIAGNOSIS — N183 Chronic kidney disease, stage 3 unspecified: Secondary | ICD-10-CM | POA: Diagnosis not present

## 2021-04-12 DIAGNOSIS — J449 Chronic obstructive pulmonary disease, unspecified: Secondary | ICD-10-CM | POA: Diagnosis not present

## 2021-04-12 DIAGNOSIS — E1122 Type 2 diabetes mellitus with diabetic chronic kidney disease: Secondary | ICD-10-CM | POA: Diagnosis not present

## 2021-04-12 DIAGNOSIS — E119 Type 2 diabetes mellitus without complications: Secondary | ICD-10-CM | POA: Diagnosis not present

## 2021-05-10 DIAGNOSIS — E119 Type 2 diabetes mellitus without complications: Secondary | ICD-10-CM | POA: Diagnosis not present

## 2021-05-10 DIAGNOSIS — I110 Hypertensive heart disease with heart failure: Secondary | ICD-10-CM | POA: Diagnosis not present

## 2021-05-10 DIAGNOSIS — I5030 Unspecified diastolic (congestive) heart failure: Secondary | ICD-10-CM | POA: Diagnosis not present

## 2021-05-10 DIAGNOSIS — J449 Chronic obstructive pulmonary disease, unspecified: Secondary | ICD-10-CM | POA: Diagnosis not present

## 2021-05-11 DIAGNOSIS — Z794 Long term (current) use of insulin: Secondary | ICD-10-CM | POA: Diagnosis not present

## 2021-05-11 DIAGNOSIS — N1831 Chronic kidney disease, stage 3a: Secondary | ICD-10-CM | POA: Diagnosis not present

## 2021-05-11 DIAGNOSIS — E119 Type 2 diabetes mellitus without complications: Secondary | ICD-10-CM | POA: Diagnosis not present

## 2021-05-11 DIAGNOSIS — I700 Atherosclerosis of aorta: Secondary | ICD-10-CM | POA: Diagnosis not present

## 2021-05-11 DIAGNOSIS — J449 Chronic obstructive pulmonary disease, unspecified: Secondary | ICD-10-CM | POA: Diagnosis not present

## 2021-05-11 DIAGNOSIS — Z0000 Encounter for general adult medical examination without abnormal findings: Secondary | ICD-10-CM | POA: Diagnosis not present

## 2021-05-25 DIAGNOSIS — N200 Calculus of kidney: Secondary | ICD-10-CM | POA: Diagnosis not present

## 2021-05-28 DIAGNOSIS — J301 Allergic rhinitis due to pollen: Secondary | ICD-10-CM | POA: Diagnosis not present

## 2021-05-28 DIAGNOSIS — J4540 Moderate persistent asthma, uncomplicated: Secondary | ICD-10-CM | POA: Diagnosis not present

## 2021-05-28 DIAGNOSIS — G4761 Periodic limb movement disorder: Secondary | ICD-10-CM | POA: Diagnosis not present

## 2021-05-28 DIAGNOSIS — R059 Cough, unspecified: Secondary | ICD-10-CM | POA: Diagnosis not present

## 2021-05-28 DIAGNOSIS — R5383 Other fatigue: Secondary | ICD-10-CM | POA: Diagnosis not present

## 2021-06-10 DIAGNOSIS — I5030 Unspecified diastolic (congestive) heart failure: Secondary | ICD-10-CM | POA: Diagnosis not present

## 2021-06-10 DIAGNOSIS — I110 Hypertensive heart disease with heart failure: Secondary | ICD-10-CM | POA: Diagnosis not present

## 2021-06-10 DIAGNOSIS — J449 Chronic obstructive pulmonary disease, unspecified: Secondary | ICD-10-CM | POA: Diagnosis not present

## 2021-06-10 DIAGNOSIS — E119 Type 2 diabetes mellitus without complications: Secondary | ICD-10-CM | POA: Diagnosis not present

## 2021-06-25 DIAGNOSIS — H52223 Regular astigmatism, bilateral: Secondary | ICD-10-CM | POA: Diagnosis not present

## 2021-06-25 DIAGNOSIS — E119 Type 2 diabetes mellitus without complications: Secondary | ICD-10-CM | POA: Diagnosis not present

## 2021-06-25 DIAGNOSIS — H26492 Other secondary cataract, left eye: Secondary | ICD-10-CM | POA: Diagnosis not present

## 2021-06-25 DIAGNOSIS — D3130 Benign neoplasm of unspecified choroid: Secondary | ICD-10-CM | POA: Diagnosis not present

## 2021-06-25 DIAGNOSIS — H524 Presbyopia: Secondary | ICD-10-CM | POA: Diagnosis not present

## 2021-06-25 DIAGNOSIS — D3131 Benign neoplasm of right choroid: Secondary | ICD-10-CM | POA: Diagnosis not present

## 2021-07-11 DIAGNOSIS — J449 Chronic obstructive pulmonary disease, unspecified: Secondary | ICD-10-CM | POA: Diagnosis not present

## 2021-07-11 DIAGNOSIS — I110 Hypertensive heart disease with heart failure: Secondary | ICD-10-CM | POA: Diagnosis not present

## 2021-07-11 DIAGNOSIS — I5030 Unspecified diastolic (congestive) heart failure: Secondary | ICD-10-CM | POA: Diagnosis not present

## 2021-07-11 DIAGNOSIS — E119 Type 2 diabetes mellitus without complications: Secondary | ICD-10-CM | POA: Diagnosis not present

## 2021-07-12 ENCOUNTER — Other Ambulatory Visit: Payer: Self-pay

## 2021-07-12 DIAGNOSIS — E1122 Type 2 diabetes mellitus with diabetic chronic kidney disease: Secondary | ICD-10-CM | POA: Diagnosis not present

## 2021-07-12 DIAGNOSIS — N183 Chronic kidney disease, stage 3 unspecified: Secondary | ICD-10-CM | POA: Diagnosis not present

## 2021-07-12 DIAGNOSIS — Z794 Long term (current) use of insulin: Secondary | ICD-10-CM | POA: Diagnosis not present

## 2021-07-12 DIAGNOSIS — D473 Essential (hemorrhagic) thrombocythemia: Secondary | ICD-10-CM | POA: Insufficient documentation

## 2021-07-12 DIAGNOSIS — E119 Type 2 diabetes mellitus without complications: Secondary | ICD-10-CM | POA: Diagnosis not present

## 2021-07-17 ENCOUNTER — Ambulatory Visit: Payer: HMO | Admitting: Cardiology

## 2021-08-01 DIAGNOSIS — D3131 Benign neoplasm of right choroid: Secondary | ICD-10-CM | POA: Diagnosis not present

## 2021-08-01 DIAGNOSIS — H43813 Vitreous degeneration, bilateral: Secondary | ICD-10-CM | POA: Diagnosis not present

## 2021-08-10 DIAGNOSIS — J449 Chronic obstructive pulmonary disease, unspecified: Secondary | ICD-10-CM | POA: Diagnosis not present

## 2021-08-10 DIAGNOSIS — E119 Type 2 diabetes mellitus without complications: Secondary | ICD-10-CM | POA: Diagnosis not present

## 2021-08-10 DIAGNOSIS — I10 Essential (primary) hypertension: Secondary | ICD-10-CM | POA: Diagnosis not present

## 2021-08-24 ENCOUNTER — Ambulatory Visit: Payer: HMO | Admitting: Cardiology

## 2021-08-28 DIAGNOSIS — J449 Chronic obstructive pulmonary disease, unspecified: Secondary | ICD-10-CM | POA: Diagnosis not present

## 2021-08-28 DIAGNOSIS — G4761 Periodic limb movement disorder: Secondary | ICD-10-CM | POA: Diagnosis not present

## 2021-08-28 DIAGNOSIS — J029 Acute pharyngitis, unspecified: Secondary | ICD-10-CM | POA: Diagnosis not present

## 2021-08-28 DIAGNOSIS — Z03818 Encounter for observation for suspected exposure to other biological agents ruled out: Secondary | ICD-10-CM | POA: Diagnosis not present

## 2021-08-28 DIAGNOSIS — Z87891 Personal history of nicotine dependence: Secondary | ICD-10-CM | POA: Diagnosis not present

## 2021-08-28 DIAGNOSIS — E559 Vitamin D deficiency, unspecified: Secondary | ICD-10-CM | POA: Diagnosis not present

## 2021-08-28 DIAGNOSIS — R5383 Other fatigue: Secondary | ICD-10-CM | POA: Diagnosis not present

## 2021-08-28 DIAGNOSIS — R059 Cough, unspecified: Secondary | ICD-10-CM | POA: Diagnosis not present

## 2021-08-28 DIAGNOSIS — J301 Allergic rhinitis due to pollen: Secondary | ICD-10-CM | POA: Diagnosis not present

## 2021-09-10 DIAGNOSIS — I10 Essential (primary) hypertension: Secondary | ICD-10-CM | POA: Diagnosis not present

## 2021-09-10 DIAGNOSIS — J449 Chronic obstructive pulmonary disease, unspecified: Secondary | ICD-10-CM | POA: Diagnosis not present

## 2021-09-10 DIAGNOSIS — E119 Type 2 diabetes mellitus without complications: Secondary | ICD-10-CM | POA: Diagnosis not present

## 2021-09-17 DIAGNOSIS — N1831 Chronic kidney disease, stage 3a: Secondary | ICD-10-CM | POA: Diagnosis not present

## 2021-09-17 DIAGNOSIS — E119 Type 2 diabetes mellitus without complications: Secondary | ICD-10-CM | POA: Diagnosis not present

## 2021-09-17 DIAGNOSIS — Z794 Long term (current) use of insulin: Secondary | ICD-10-CM | POA: Diagnosis not present

## 2021-09-17 DIAGNOSIS — J449 Chronic obstructive pulmonary disease, unspecified: Secondary | ICD-10-CM | POA: Diagnosis not present

## 2021-09-18 DIAGNOSIS — J449 Chronic obstructive pulmonary disease, unspecified: Secondary | ICD-10-CM | POA: Diagnosis not present

## 2021-09-18 DIAGNOSIS — G4761 Periodic limb movement disorder: Secondary | ICD-10-CM | POA: Diagnosis not present

## 2021-09-18 DIAGNOSIS — R5383 Other fatigue: Secondary | ICD-10-CM | POA: Diagnosis not present

## 2021-09-18 DIAGNOSIS — J301 Allergic rhinitis due to pollen: Secondary | ICD-10-CM | POA: Diagnosis not present

## 2021-09-19 DIAGNOSIS — Z23 Encounter for immunization: Secondary | ICD-10-CM | POA: Diagnosis not present

## 2021-10-10 DIAGNOSIS — I10 Essential (primary) hypertension: Secondary | ICD-10-CM | POA: Diagnosis not present

## 2021-10-10 DIAGNOSIS — J449 Chronic obstructive pulmonary disease, unspecified: Secondary | ICD-10-CM | POA: Diagnosis not present

## 2021-10-10 DIAGNOSIS — E119 Type 2 diabetes mellitus without complications: Secondary | ICD-10-CM | POA: Diagnosis not present

## 2021-10-11 DIAGNOSIS — E119 Type 2 diabetes mellitus without complications: Secondary | ICD-10-CM | POA: Diagnosis not present

## 2021-10-11 DIAGNOSIS — J449 Chronic obstructive pulmonary disease, unspecified: Secondary | ICD-10-CM | POA: Diagnosis not present

## 2021-10-11 DIAGNOSIS — I5030 Unspecified diastolic (congestive) heart failure: Secondary | ICD-10-CM | POA: Diagnosis not present

## 2021-10-11 DIAGNOSIS — Z794 Long term (current) use of insulin: Secondary | ICD-10-CM | POA: Diagnosis not present

## 2021-11-08 ENCOUNTER — Ambulatory Visit: Payer: HMO | Admitting: Cardiology

## 2021-11-09 DIAGNOSIS — E119 Type 2 diabetes mellitus without complications: Secondary | ICD-10-CM | POA: Diagnosis not present

## 2021-11-09 DIAGNOSIS — J449 Chronic obstructive pulmonary disease, unspecified: Secondary | ICD-10-CM | POA: Diagnosis not present

## 2021-11-09 DIAGNOSIS — I10 Essential (primary) hypertension: Secondary | ICD-10-CM | POA: Diagnosis not present

## 2021-12-11 DIAGNOSIS — I10 Essential (primary) hypertension: Secondary | ICD-10-CM | POA: Diagnosis not present

## 2021-12-11 DIAGNOSIS — E782 Mixed hyperlipidemia: Secondary | ICD-10-CM | POA: Diagnosis not present

## 2021-12-17 DIAGNOSIS — M545 Low back pain, unspecified: Secondary | ICD-10-CM | POA: Diagnosis not present

## 2021-12-17 DIAGNOSIS — E119 Type 2 diabetes mellitus without complications: Secondary | ICD-10-CM | POA: Diagnosis not present

## 2021-12-17 DIAGNOSIS — Z6827 Body mass index (BMI) 27.0-27.9, adult: Secondary | ICD-10-CM | POA: Diagnosis not present

## 2021-12-17 DIAGNOSIS — J441 Chronic obstructive pulmonary disease with (acute) exacerbation: Secondary | ICD-10-CM | POA: Diagnosis not present

## 2021-12-17 DIAGNOSIS — Z794 Long term (current) use of insulin: Secondary | ICD-10-CM | POA: Diagnosis not present

## 2021-12-27 DIAGNOSIS — Z794 Long term (current) use of insulin: Secondary | ICD-10-CM | POA: Diagnosis not present

## 2021-12-27 DIAGNOSIS — E119 Type 2 diabetes mellitus without complications: Secondary | ICD-10-CM | POA: Diagnosis not present

## 2021-12-27 DIAGNOSIS — R0789 Other chest pain: Secondary | ICD-10-CM | POA: Diagnosis not present

## 2021-12-27 DIAGNOSIS — Z6827 Body mass index (BMI) 27.0-27.9, adult: Secondary | ICD-10-CM | POA: Diagnosis not present

## 2021-12-27 DIAGNOSIS — J441 Chronic obstructive pulmonary disease with (acute) exacerbation: Secondary | ICD-10-CM | POA: Diagnosis not present

## 2021-12-27 DIAGNOSIS — R051 Acute cough: Secondary | ICD-10-CM | POA: Diagnosis not present

## 2021-12-28 DIAGNOSIS — Z794 Long term (current) use of insulin: Secondary | ICD-10-CM | POA: Diagnosis not present

## 2021-12-28 DIAGNOSIS — J441 Chronic obstructive pulmonary disease with (acute) exacerbation: Secondary | ICD-10-CM | POA: Diagnosis not present

## 2021-12-28 DIAGNOSIS — E119 Type 2 diabetes mellitus without complications: Secondary | ICD-10-CM | POA: Diagnosis not present

## 2021-12-28 DIAGNOSIS — J189 Pneumonia, unspecified organism: Secondary | ICD-10-CM | POA: Diagnosis not present

## 2021-12-28 DIAGNOSIS — Z6827 Body mass index (BMI) 27.0-27.9, adult: Secondary | ICD-10-CM | POA: Diagnosis not present

## 2021-12-31 DIAGNOSIS — J189 Pneumonia, unspecified organism: Secondary | ICD-10-CM | POA: Diagnosis not present

## 2021-12-31 DIAGNOSIS — Z6827 Body mass index (BMI) 27.0-27.9, adult: Secondary | ICD-10-CM | POA: Diagnosis not present

## 2021-12-31 DIAGNOSIS — E119 Type 2 diabetes mellitus without complications: Secondary | ICD-10-CM | POA: Diagnosis not present

## 2021-12-31 DIAGNOSIS — Z794 Long term (current) use of insulin: Secondary | ICD-10-CM | POA: Diagnosis not present

## 2021-12-31 DIAGNOSIS — J441 Chronic obstructive pulmonary disease with (acute) exacerbation: Secondary | ICD-10-CM | POA: Diagnosis not present

## 2022-01-02 DIAGNOSIS — J189 Pneumonia, unspecified organism: Secondary | ICD-10-CM | POA: Diagnosis not present

## 2022-01-02 DIAGNOSIS — J441 Chronic obstructive pulmonary disease with (acute) exacerbation: Secondary | ICD-10-CM | POA: Diagnosis not present

## 2022-01-02 DIAGNOSIS — E119 Type 2 diabetes mellitus without complications: Secondary | ICD-10-CM | POA: Diagnosis not present

## 2022-01-02 DIAGNOSIS — Z794 Long term (current) use of insulin: Secondary | ICD-10-CM | POA: Diagnosis not present

## 2022-01-02 DIAGNOSIS — Z6828 Body mass index (BMI) 28.0-28.9, adult: Secondary | ICD-10-CM | POA: Diagnosis not present

## 2022-01-07 ENCOUNTER — Other Ambulatory Visit: Payer: Self-pay

## 2022-01-07 DIAGNOSIS — E669 Obesity, unspecified: Secondary | ICD-10-CM | POA: Diagnosis not present

## 2022-01-07 DIAGNOSIS — J189 Pneumonia, unspecified organism: Secondary | ICD-10-CM | POA: Diagnosis not present

## 2022-01-07 DIAGNOSIS — E1169 Type 2 diabetes mellitus with other specified complication: Secondary | ICD-10-CM | POA: Diagnosis not present

## 2022-01-07 DIAGNOSIS — B3731 Acute candidiasis of vulva and vagina: Secondary | ICD-10-CM | POA: Diagnosis not present

## 2022-01-07 DIAGNOSIS — I5030 Unspecified diastolic (congestive) heart failure: Secondary | ICD-10-CM | POA: Diagnosis not present

## 2022-01-07 DIAGNOSIS — J441 Chronic obstructive pulmonary disease with (acute) exacerbation: Secondary | ICD-10-CM | POA: Diagnosis not present

## 2022-01-07 DIAGNOSIS — Z6827 Body mass index (BMI) 27.0-27.9, adult: Secondary | ICD-10-CM | POA: Diagnosis not present

## 2022-01-09 ENCOUNTER — Ambulatory Visit (INDEPENDENT_AMBULATORY_CARE_PROVIDER_SITE_OTHER): Payer: HMO | Admitting: Cardiology

## 2022-01-09 ENCOUNTER — Encounter: Payer: Self-pay | Admitting: Cardiology

## 2022-01-09 ENCOUNTER — Other Ambulatory Visit: Payer: Self-pay

## 2022-01-09 VITALS — BP 76/42 | HR 78 | Ht 62.0 in | Wt 158.0 lb

## 2022-01-09 DIAGNOSIS — E088 Diabetes mellitus due to underlying condition with unspecified complications: Secondary | ICD-10-CM | POA: Diagnosis not present

## 2022-01-09 DIAGNOSIS — E782 Mixed hyperlipidemia: Secondary | ICD-10-CM

## 2022-01-09 DIAGNOSIS — I3139 Other pericardial effusion (noninflammatory): Secondary | ICD-10-CM

## 2022-01-09 NOTE — Progress Notes (Signed)
Cardiology Office Note:    Date:  01/09/2022   ID:  Pam Gamble, DOB Jun 04, 1946, MRN [id number]  PCP:  Serita Grammes, MD  Cardiologist:  Jenean Lindau, MD   Referring MD: Serita Grammes, MD    ASSESSMENT:    1. Pericardial effusion   2. Diabetes mellitus due to underlying condition with unspecified complications ([place])   3. Mixed dyslipidemia    PLAN:    In order of problems listed above:  Primary prevention stressed with patient.  Importance of compliance with diet medication stressed and she vocalized understanding.  She was advised to walk at least half an hour a day 5 days a week and she promises to do so. Diabetes mellitus: Managed by primary care.  Hemoglobin A1c is elevated and I cautioned her against this.  She is on ACE inhibitor for the same reason. Borderline blood pressure: I discussed this with the patient at length.  She is completely asymptomatic and on ACE inhibitor for diabetes.  I discussed with her about and educated her about orthostatic hypotension symptoms and to stop blood pressure medicines if they happen. Mixed dyslipidemia: Diet emphasized.  Lifestyle modification urged lipids followed by primary care. Pericardial effusion: Stable at this time.  Patient is happy about it.  I discussed echo report with her at length. Patient will be seen in follow-up appointment in 9 months or earlier if the patient has any concerns    Medication Adjustments/Labs and Tests Ordered: Current medicines are reviewed at length with the patient today.  Concerns regarding medicines are outlined above.  Orders Placed This Encounter  Procedures   EKG 12-Lead   No orders of the defined types were placed in this encounter.    No chief complaint on file.    History of Present Illness:    Pam Gamble is a 76 y.o. female.  Patient has past medical history of mixed dyslipidemia, diabetes mellitus and pericardial effusion.  She denies any problems at this  time and takes care of activities of daily living.  No chest pain orthopnea or PND.  At the time of my evaluation, the patient is alert awake oriented and in no distress.  Past Medical History:  Diagnosis Date   Abdominal pain, unspecified site 07/12/2014   8/15 lower R>L ? etiol - r/o pyelo and others    Acute biliary pancreatitis 10/01/2018   Acute cholangitis 10/01/2018   Acute non-recurrent maxillary sinusitis 01/12/2011   Qualifier: Diagnosis of  By: Madilyn Fireman MD, [name]     Allergic rhinitis 01/08/2008   Chronic     Atrophic vaginitis 05/03/2015   6/16    Barrett's esophagus    C O P D WITH ACUTE EXACERBATION 03/21/2010   9/13, 11/14, 8/18  [name], Leanna Sato.  Potential benefits of a short/long term steroid  use as well as potential risks  and complications were explained to the patient and were aknowledged.      Calcium deposits in tendon and bursa    CAROTID BRUIT 02/09/2008   Qualifier: Diagnosis of  By: Plotnikov MD, Evie Lacks    Chest discomfort 06/22/2019   COPD (chronic obstructive pulmonary disease) (HCC)    COPD, frequent exacerbations ([place]) 09/15/2007   Dr Vaughan Browner Chronic, severe Advair Albuterol HHN prn Steroids - frequent use  Pt sleeps in a recliner since 2012    Cough 05/23/2012   Depression    Diabetes mellitus due to underlying condition with unspecified complications ([place]) 31/51/7616   Diabetes type  2, uncontrolled 12/27/2013   2/15 2/17 Levemir - too $$$. Started Toujeo (10/17) samples Continue with current prescription therapy as reflected on the Med list - Levemir. Risks associated with treatment noncompliance were discussed. Compliance was encouraged. 2018 Metformin and NPH insulin 5/18 Use NPH insulin 40 u in am and 10 u at night. Titrate up by 1 unit a day each (AM and HS doses) for goal sugars of 100-130 up to 60    Dyspnea on exertion 06/22/2019   Edema 09/09/2014   10/15 B LE    Esophageal reflux    Ex-smoker 06/22/2019   Fever, unspecified  07/12/2014   8/15 mild T99F    HYSTERECTOMY, PARTIAL, HX OF 09/26/2007   Qualifier: Diagnosis of  By: Danny Lawless CMA, [place]     Idiopathic thrombocythemia ([place])    Kidney stone 07/27/2014   2014 Dr Janice Norrie   Kidney stones    Lateral epicondylitis  of elbow    Leg cramp 04/15/2014    Albuterol can give cramps    Low back pain 04/25/2017   Spinal stenosis   Major depressive disorder, recurrent episode ([place]) 06/14/2009   Chronic      Mixed dyslipidemia 03/28/2020   Noncompliance 09/10/2016   Recurrent    Pericardial effusion 06/22/2019   Personal history of malignant neoplasm of other parts of uterus    Personal history of other diseases of digestive system    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   Remote  Levaquin Rx Vaccines    Pyelonephritis, acute 02/26/2011   2012, 2015 L    Rotator cuff arthropathy 03/21/2015   Injection 03/21/2015    Shortness of breath    upon exertion   Tick bite 01/18/2017   TOBACCO USE, QUIT 01/10/2010   Qualifier: Diagnosis of  By: Doralee Albino      Past Surgical History:  Procedure Laterality Date   ABDOMINAL HYSTERECTOMY     CATARACT EXTRACTION Bilateral 01/2016 and 03/2016   CHOLECYSTECTOMY N/A 10/03/2018   Procedure: LAPAROSCOPIC CHOLECYSTECTOMY WITH INTRAOPERATIVE CHOLANGIOGRAM;  Surgeon: Alphonsa Overall, MD;  Location: [facility];  Service: General;  Laterality: N/A;   LITHOTRIPSY     REFRACTIVE SURGERY Right 05/16/2016   SPLENECTOMY  1999   TONSILLECTOMY AND ADENOIDECTOMY      Current Medications: Current Meds  Medication Sig   albuterol (PROVENTIL) (2.5 MG/3ML) 0.083% nebulizer solution Take 2.5 mg by nebulization every 8 (eight) hours.   aspirin EC 81 MG tablet Take 81 mg by mouth daily.   Azelastine HCl 137 MCG/SPRAY SOLN Place 2 sprays into both nostrils 2 (two) times daily.   Cholecalciferol (VITAMIN D3) 25 MCG (1000 UT) CAPS Take 1,000 Units by mouth daily.   Cyanocobalamin (B-12 PO) Take 1 tablet by mouth daily.   Fluticasone-Umeclidin-Vilant  (TRELEGY ELLIPTA) 100-62.5-25 MCG/INH AEPB Inhale 1 puff into the lungs daily.   levocetirizine (XYZAL) 5 MG tablet Take 5 mg by mouth at bedtime.   lisinopril (ZESTRIL) 10 MG tablet Take 10 mg by mouth daily.   montelukast (SINGULAIR) 10 MG tablet Take 10 mg by mouth daily.   omeprazole (PRILOSEC) 40 MG capsule Take 40 mg by mouth 2 (two) times daily.   OZEMPIC, 1 MG/DOSE, 4 MG/3ML SOPN Inject 1 mg into the skin once a week.   rOPINIRole (REQUIP) 2 MG tablet Take 2 mg by mouth at bedtime.   rosuvastatin (CRESTOR) 20 MG tablet Take 20 mg by mouth at bedtime.   TRESIBA FLEXTOUCH 100 UNIT/ML FlexTouch Pen Inject 30 Units into the skin daily.  vitamin C (ASCORBIC ACID) 500 MG tablet Take 500 mg by mouth daily.     Allergies:   Amoxicillin-pot clavulanate, Levaquin [levofloxacin], and Ertugliflozin   Social History   Socioeconomic History   Marital status: Single    Spouse name: Not on file   Number of children: 0   Years of education: Not on file   Highest education level: Not on file  Occupational History   Occupation: retired    Fish farm manager: [employer]  Tobacco Use   Smoking status: Former    Packs/day: 2.00    Years: 49.00    Pack years: 98.00    Types: Cigarettes    Quit date: 11/11/2005    Years since quitting: 16.1   Smokeless tobacco: Never  Vaping Use   Vaping Use: Never used  Substance and Sexual Activity   Alcohol use: Yes    Alcohol/week: 0.0 standard drinks    Comment: beer, whiskey, margherita   Drug use: No   Sexual activity: Not Currently  Other Topics Concern   Not on file  Social History Narrative   Retired - Chief Operating Officer   Lives alone   Single   Social Determinants of Health   Financial Resource Strain: Not on file  Food Insecurity: Not on file  Transportation Needs: Not on file  Physical Activity: Not on file  Stress: Not on file  Social Connections: Not on file     Family History: The patient's family history includes  Diabetes in her father, maternal aunt, and sister; Heart disease in her father and mother; Liver cancer in her sister; Pancreatic cancer in her sister; Stroke in her mother.  ROS:   Please see the history of present illness.    All other systems reviewed and are negative.  EKGs/Labs/Other Studies Reviewed:    The following studies were reviewed today: EKG reveals sinus rhythm and nonspecific ST-T changes   Recent Labs: No results found for requested labs within last 8760 hours.  Recent Lipid Panel    Component Value Date/Time   CHOL 228 (H) 12/12/2016 0853   TRIG 114.0 12/12/2016 0853   HDL 52.60 12/12/2016 0853   CHOLHDL 4 12/12/2016 0853   VLDL 22.8 12/12/2016 0853   LDLCALC 153 (H) 12/12/2016 0853   LDLDIRECT 160.1 07/12/2014 1423    Physical Exam:    VS:  BP (!) 76/42    Pulse 78    Ht 5\' 2"  (1.575 m)    Wt 158 lb (71.7 kg)    SpO2 97%    BMI 28.90 kg/m     Wt Readings from Last 3 Encounters:  01/09/22 158 lb (71.7 kg)  01/09/21 170 lb (77.1 kg)  03/28/20 166 lb (75.3 kg)     GEN: Patient is in no acute distress HEENT: Normal NECK: No JVD; No carotid bruits LYMPHATICS: No lymphadenopathy CARDIAC: Hear sounds regular, 2/6 systolic murmur at the apex. RESPIRATORY:  Clear to auscultation without rales, wheezing or rhonchi  ABDOMEN: Soft, non-tender, non-distended MUSCULOSKELETAL:  No edema; No deformity  SKIN: Warm and dry NEUROLOGIC:  Alert and oriented x 3 PSYCHIATRIC:  Normal affect   Signed, Jenean Lindau, MD  01/09/2022 9:41 AM    [place]

## 2022-01-09 NOTE — Patient Instructions (Signed)
Medication Instructions:  ?Your physician recommends that you continue on your current medications as directed. Please refer to the Current Medication list given to you today.  ?*If you need a refill on your cardiac medications before your next appointment, please call your pharmacy* ? ? ?Lab Work: ?None ordered ?If you have labs (blood work) drawn today and your tests are completely normal, you will receive your results only by: ?MyChart Message (if you have MyChart) OR ?A paper copy in the mail ?If you have any lab test that is abnormal or we need to change your treatment, we will call you to review the results. ? ? ?Testing/Procedures: ?None ordered ? ? ?Follow-Up: ?At [facility], you and your health needs are our priority.  As part of our continuing mission to provide you with exceptional heart care, we have created designated Provider Care Teams.  These Care Teams include your primary Cardiologist (physician) and Advanced Practice Providers (APPs -  Physician Assistants and Nurse Practitioners) who all work together to provide you with the care you need, when you need it. ? ?We recommend signing up for the patient portal called "MyChart".  Sign up information is provided on this After Visit Summary.  MyChart is used to connect with patients for Virtual Visits (Telemedicine).  Patients are able to view lab/test results, encounter notes, upcoming appointments, etc.  Non-urgent messages can be sent to your provider as well.   ?To learn more about what you can do with MyChart, go to [email or web address].   ? ?Your next appointment:   ?9 month(s) ? ?The format for your next appointment:   ?In Person ? ?Provider:   ?Jyl Heinz, MD ? ? ?Other Instructions ? ?If you notice your BP being low with changes in your position, let us know and stop you Lisinopril. ?

## 2022-02-12 DIAGNOSIS — Z6827 Body mass index (BMI) 27.0-27.9, adult: Secondary | ICD-10-CM | POA: Diagnosis not present

## 2022-02-12 DIAGNOSIS — N3000 Acute cystitis without hematuria: Secondary | ICD-10-CM | POA: Diagnosis not present

## 2022-02-12 DIAGNOSIS — Z794 Long term (current) use of insulin: Secondary | ICD-10-CM | POA: Diagnosis not present

## 2022-02-12 DIAGNOSIS — R109 Unspecified abdominal pain: Secondary | ICD-10-CM | POA: Diagnosis not present

## 2022-02-12 DIAGNOSIS — J441 Chronic obstructive pulmonary disease with (acute) exacerbation: Secondary | ICD-10-CM | POA: Diagnosis not present

## 2022-02-12 DIAGNOSIS — E119 Type 2 diabetes mellitus without complications: Secondary | ICD-10-CM | POA: Diagnosis not present

## 2022-02-19 DIAGNOSIS — R5383 Other fatigue: Secondary | ICD-10-CM | POA: Diagnosis not present

## 2022-02-19 DIAGNOSIS — J449 Chronic obstructive pulmonary disease, unspecified: Secondary | ICD-10-CM | POA: Diagnosis not present

## 2022-02-19 DIAGNOSIS — G4761 Periodic limb movement disorder: Secondary | ICD-10-CM | POA: Diagnosis not present

## 2022-02-19 DIAGNOSIS — J301 Allergic rhinitis due to pollen: Secondary | ICD-10-CM | POA: Diagnosis not present

## 2022-02-22 ENCOUNTER — Ambulatory Visit: Payer: HMO | Admitting: Cardiology

## 2022-03-01 DIAGNOSIS — R0600 Dyspnea, unspecified: Secondary | ICD-10-CM | POA: Diagnosis not present

## 2022-03-01 DIAGNOSIS — R918 Other nonspecific abnormal finding of lung field: Secondary | ICD-10-CM | POA: Diagnosis not present

## 2022-03-01 DIAGNOSIS — I700 Atherosclerosis of aorta: Secondary | ICD-10-CM | POA: Diagnosis not present

## 2022-03-01 DIAGNOSIS — J439 Emphysema, unspecified: Secondary | ICD-10-CM | POA: Diagnosis not present

## 2022-03-01 DIAGNOSIS — J9809 Other diseases of bronchus, not elsewhere classified: Secondary | ICD-10-CM | POA: Diagnosis not present

## 2022-03-06 DIAGNOSIS — D3131 Benign neoplasm of right choroid: Secondary | ICD-10-CM | POA: Diagnosis not present

## 2022-03-06 DIAGNOSIS — H43813 Vitreous degeneration, bilateral: Secondary | ICD-10-CM | POA: Diagnosis not present

## 2022-03-08 DIAGNOSIS — F32A Depression, unspecified: Secondary | ICD-10-CM | POA: Diagnosis not present

## 2022-03-08 DIAGNOSIS — D693 Immune thrombocytopenic purpura: Secondary | ICD-10-CM | POA: Diagnosis not present

## 2022-03-08 DIAGNOSIS — Z792 Long term (current) use of antibiotics: Secondary | ICD-10-CM | POA: Diagnosis not present

## 2022-03-08 DIAGNOSIS — K219 Gastro-esophageal reflux disease without esophagitis: Secondary | ICD-10-CM | POA: Diagnosis not present

## 2022-03-08 DIAGNOSIS — Z9081 Acquired absence of spleen: Secondary | ICD-10-CM | POA: Diagnosis not present

## 2022-03-08 DIAGNOSIS — R1012 Left upper quadrant pain: Secondary | ICD-10-CM | POA: Diagnosis not present

## 2022-03-08 DIAGNOSIS — I10 Essential (primary) hypertension: Secondary | ICD-10-CM | POA: Diagnosis not present

## 2022-03-08 DIAGNOSIS — E119 Type 2 diabetes mellitus without complications: Secondary | ICD-10-CM | POA: Diagnosis not present

## 2022-03-08 DIAGNOSIS — Z880 Allergy status to penicillin: Secondary | ICD-10-CM | POA: Diagnosis not present

## 2022-03-08 DIAGNOSIS — E8729 Other acidosis: Secondary | ICD-10-CM | POA: Diagnosis not present

## 2022-03-08 DIAGNOSIS — K529 Noninfective gastroenteritis and colitis, unspecified: Secondary | ICD-10-CM | POA: Diagnosis not present

## 2022-03-08 DIAGNOSIS — I129 Hypertensive chronic kidney disease with stage 1 through stage 4 chronic kidney disease, or unspecified chronic kidney disease: Secondary | ICD-10-CM | POA: Diagnosis not present

## 2022-03-08 DIAGNOSIS — K6389 Other specified diseases of intestine: Secondary | ICD-10-CM | POA: Diagnosis not present

## 2022-03-08 DIAGNOSIS — Z87440 Personal history of urinary (tract) infections: Secondary | ICD-10-CM | POA: Diagnosis not present

## 2022-03-08 DIAGNOSIS — E875 Hyperkalemia: Secondary | ICD-10-CM | POA: Diagnosis not present

## 2022-03-08 DIAGNOSIS — Z8541 Personal history of malignant neoplasm of cervix uteri: Secondary | ICD-10-CM | POA: Diagnosis not present

## 2022-03-08 DIAGNOSIS — E1122 Type 2 diabetes mellitus with diabetic chronic kidney disease: Secondary | ICD-10-CM | POA: Diagnosis not present

## 2022-03-08 DIAGNOSIS — Z87891 Personal history of nicotine dependence: Secondary | ICD-10-CM | POA: Diagnosis not present

## 2022-03-08 DIAGNOSIS — Z79899 Other long term (current) drug therapy: Secondary | ICD-10-CM | POA: Diagnosis not present

## 2022-03-08 DIAGNOSIS — J449 Chronic obstructive pulmonary disease, unspecified: Secondary | ICD-10-CM | POA: Diagnosis not present

## 2022-03-08 DIAGNOSIS — M1990 Unspecified osteoarthritis, unspecified site: Secondary | ICD-10-CM | POA: Diagnosis not present

## 2022-03-08 DIAGNOSIS — N189 Chronic kidney disease, unspecified: Secondary | ICD-10-CM | POA: Diagnosis not present

## 2022-03-08 DIAGNOSIS — N179 Acute kidney failure, unspecified: Secondary | ICD-10-CM | POA: Diagnosis not present

## 2022-03-08 DIAGNOSIS — D631 Anemia in chronic kidney disease: Secondary | ICD-10-CM | POA: Diagnosis not present

## 2022-03-08 DIAGNOSIS — R1032 Left lower quadrant pain: Secondary | ICD-10-CM | POA: Diagnosis not present

## 2022-03-08 DIAGNOSIS — E785 Hyperlipidemia, unspecified: Secondary | ICD-10-CM | POA: Diagnosis not present

## 2022-03-08 DIAGNOSIS — E860 Dehydration: Secondary | ICD-10-CM | POA: Diagnosis not present

## 2022-03-08 DIAGNOSIS — N200 Calculus of kidney: Secondary | ICD-10-CM | POA: Diagnosis not present

## 2022-03-08 DIAGNOSIS — Z90710 Acquired absence of both cervix and uterus: Secondary | ICD-10-CM | POA: Diagnosis not present

## 2022-03-08 DIAGNOSIS — K5900 Constipation, unspecified: Secondary | ICD-10-CM | POA: Diagnosis not present

## 2022-03-08 DIAGNOSIS — Z794 Long term (current) use of insulin: Secondary | ICD-10-CM | POA: Diagnosis not present

## 2022-03-08 DIAGNOSIS — N1832 Chronic kidney disease, stage 3b: Secondary | ICD-10-CM | POA: Diagnosis not present

## 2022-03-10 DIAGNOSIS — J449 Chronic obstructive pulmonary disease, unspecified: Secondary | ICD-10-CM | POA: Diagnosis not present

## 2022-03-10 DIAGNOSIS — E119 Type 2 diabetes mellitus without complications: Secondary | ICD-10-CM | POA: Diagnosis not present

## 2022-03-10 DIAGNOSIS — I10 Essential (primary) hypertension: Secondary | ICD-10-CM | POA: Diagnosis not present

## 2022-03-14 DIAGNOSIS — N3001 Acute cystitis with hematuria: Secondary | ICD-10-CM | POA: Diagnosis not present

## 2022-03-14 DIAGNOSIS — Z6827 Body mass index (BMI) 27.0-27.9, adult: Secondary | ICD-10-CM | POA: Diagnosis not present

## 2022-03-14 DIAGNOSIS — D72829 Elevated white blood cell count, unspecified: Secondary | ICD-10-CM | POA: Diagnosis not present

## 2022-03-14 DIAGNOSIS — N179 Acute kidney failure, unspecified: Secondary | ICD-10-CM | POA: Diagnosis not present

## 2022-03-14 DIAGNOSIS — K5900 Constipation, unspecified: Secondary | ICD-10-CM | POA: Diagnosis not present

## 2022-03-15 ENCOUNTER — Inpatient Hospital Stay (HOSPITAL_COMMUNITY)
Admission: EM | Admit: 2022-03-15 | Discharge: 2022-03-18 | DRG: 683 | Disposition: A | Discharge status: Home / Self Care | Payer: HMO | Attending: Family Medicine | Admitting: Family Medicine

## 2022-03-15 ENCOUNTER — Emergency Department (HOSPITAL_COMMUNITY): Payer: HMO

## 2022-03-15 ENCOUNTER — Encounter (HOSPITAL_COMMUNITY): Payer: Self-pay | Admitting: *Deleted

## 2022-03-15 ENCOUNTER — Other Ambulatory Visit: Payer: Self-pay

## 2022-03-15 DIAGNOSIS — N390 Urinary tract infection, site not specified: Secondary | ICD-10-CM | POA: Diagnosis not present

## 2022-03-15 DIAGNOSIS — J309 Allergic rhinitis, unspecified: Secondary | ICD-10-CM | POA: Diagnosis present

## 2022-03-15 DIAGNOSIS — Z833 Family history of diabetes mellitus: Secondary | ICD-10-CM | POA: Diagnosis not present

## 2022-03-15 DIAGNOSIS — Z823 Family history of stroke: Secondary | ICD-10-CM

## 2022-03-15 DIAGNOSIS — E875 Hyperkalemia: Secondary | ICD-10-CM | POA: Diagnosis present

## 2022-03-15 DIAGNOSIS — K8309 Other cholangitis: Secondary | ICD-10-CM

## 2022-03-15 DIAGNOSIS — A419 Sepsis, unspecified organism: Secondary | ICD-10-CM | POA: Diagnosis not present

## 2022-03-15 DIAGNOSIS — D473 Essential (hemorrhagic) thrombocythemia: Secondary | ICD-10-CM

## 2022-03-15 DIAGNOSIS — I3139 Other pericardial effusion (noninflammatory): Secondary | ICD-10-CM

## 2022-03-15 DIAGNOSIS — Z8542 Personal history of malignant neoplasm of other parts of uterus: Secondary | ICD-10-CM

## 2022-03-15 DIAGNOSIS — I5032 Chronic diastolic (congestive) heart failure: Secondary | ICD-10-CM | POA: Diagnosis present

## 2022-03-15 DIAGNOSIS — K529 Noninfective gastroenteritis and colitis, unspecified: Secondary | ICD-10-CM | POA: Diagnosis present

## 2022-03-15 DIAGNOSIS — Z7982 Long term (current) use of aspirin: Secondary | ICD-10-CM

## 2022-03-15 DIAGNOSIS — R509 Fever, unspecified: Secondary | ICD-10-CM

## 2022-03-15 DIAGNOSIS — R0609 Other forms of dyspnea: Secondary | ICD-10-CM

## 2022-03-15 DIAGNOSIS — I10 Essential (primary) hypertension: Secondary | ICD-10-CM | POA: Diagnosis not present

## 2022-03-15 DIAGNOSIS — R531 Weakness: Secondary | ICD-10-CM

## 2022-03-15 DIAGNOSIS — N189 Chronic kidney disease, unspecified: Secondary | ICD-10-CM | POA: Diagnosis present

## 2022-03-15 DIAGNOSIS — E860 Dehydration: Secondary | ICD-10-CM | POA: Diagnosis present

## 2022-03-15 DIAGNOSIS — Z9081 Acquired absence of spleen: Secondary | ICD-10-CM

## 2022-03-15 DIAGNOSIS — K5900 Constipation, unspecified: Secondary | ICD-10-CM | POA: Diagnosis present

## 2022-03-15 DIAGNOSIS — Z91199 Patient's noncompliance with other medical treatment and regimen due to unspecified reason: Secondary | ICD-10-CM

## 2022-03-15 DIAGNOSIS — N19 Unspecified kidney failure: Secondary | ICD-10-CM | POA: Diagnosis not present

## 2022-03-15 DIAGNOSIS — Z7951 Long term (current) use of inhaled steroids: Secondary | ICD-10-CM | POA: Diagnosis not present

## 2022-03-15 DIAGNOSIS — E782 Mixed hyperlipidemia: Secondary | ICD-10-CM | POA: Diagnosis present

## 2022-03-15 DIAGNOSIS — R0789 Other chest pain: Secondary | ICD-10-CM

## 2022-03-15 DIAGNOSIS — N952 Postmenopausal atrophic vaginitis: Secondary | ICD-10-CM

## 2022-03-15 DIAGNOSIS — J449 Chronic obstructive pulmonary disease, unspecified: Secondary | ICD-10-CM | POA: Diagnosis present

## 2022-03-15 DIAGNOSIS — F329 Major depressive disorder, single episode, unspecified: Secondary | ICD-10-CM | POA: Diagnosis present

## 2022-03-15 DIAGNOSIS — K219 Gastro-esophageal reflux disease without esophagitis: Secondary | ICD-10-CM | POA: Diagnosis present

## 2022-03-15 DIAGNOSIS — R0602 Shortness of breath: Secondary | ICD-10-CM

## 2022-03-15 DIAGNOSIS — N179 Acute kidney failure, unspecified: Secondary | ICD-10-CM | POA: Diagnosis present

## 2022-03-15 DIAGNOSIS — Z8249 Family history of ischemic heart disease and other diseases of the circulatory system: Secondary | ICD-10-CM

## 2022-03-15 DIAGNOSIS — N12 Tubulo-interstitial nephritis, not specified as acute or chronic: Secondary | ICD-10-CM | POA: Diagnosis present

## 2022-03-15 DIAGNOSIS — M6520 Calcific tendinitis, unspecified site: Secondary | ICD-10-CM

## 2022-03-15 DIAGNOSIS — R1013 Epigastric pain: Secondary | ICD-10-CM

## 2022-03-15 DIAGNOSIS — N183 Chronic kidney disease, stage 3 unspecified: Secondary | ICD-10-CM | POA: Diagnosis not present

## 2022-03-15 DIAGNOSIS — E1122 Type 2 diabetes mellitus with diabetic chronic kidney disease: Secondary | ICD-10-CM | POA: Diagnosis present

## 2022-03-15 DIAGNOSIS — Z66 Do not resuscitate: Secondary | ICD-10-CM | POA: Diagnosis present

## 2022-03-15 DIAGNOSIS — Z87891 Personal history of nicotine dependence: Secondary | ICD-10-CM

## 2022-03-15 DIAGNOSIS — Z800 Family history of malignant neoplasm of digestive organs: Secondary | ICD-10-CM | POA: Diagnosis not present

## 2022-03-15 DIAGNOSIS — R109 Unspecified abdominal pain: Secondary | ICD-10-CM | POA: Diagnosis not present

## 2022-03-15 DIAGNOSIS — Z79899 Other long term (current) drug therapy: Secondary | ICD-10-CM | POA: Diagnosis not present

## 2022-03-15 DIAGNOSIS — N200 Calculus of kidney: Secondary | ICD-10-CM

## 2022-03-15 DIAGNOSIS — J441 Chronic obstructive pulmonary disease with (acute) exacerbation: Secondary | ICD-10-CM

## 2022-03-15 DIAGNOSIS — Z20822 Contact with and (suspected) exposure to covid-19: Secondary | ICD-10-CM | POA: Diagnosis present

## 2022-03-15 DIAGNOSIS — I700 Atherosclerosis of aorta: Secondary | ICD-10-CM | POA: Diagnosis not present

## 2022-03-15 DIAGNOSIS — E088 Diabetes mellitus due to underlying condition with unspecified complications: Secondary | ICD-10-CM

## 2022-03-15 DIAGNOSIS — R252 Cramp and spasm: Secondary | ICD-10-CM

## 2022-03-15 DIAGNOSIS — J0100 Acute maxillary sinusitis, unspecified: Secondary | ICD-10-CM

## 2022-03-15 DIAGNOSIS — N10 Acute tubulo-interstitial nephritis: Secondary | ICD-10-CM

## 2022-03-15 HISTORY — DX: Chronic kidney disease, unspecified: N17.9

## 2022-03-15 LAB — COMPREHENSIVE METABOLIC PANEL
ALT: 19 U/L (ref 0–44)
AST: 34 U/L (ref 15–41)
Albumin: 3.1 g/dL — ABNORMAL LOW (ref 3.5–5.0)
Alkaline Phosphatase: 53 U/L (ref 38–126)
Anion gap: 14 (ref 5–15)
BUN: 71 mg/dL — ABNORMAL HIGH (ref 8–23)
CO2: 20 mmol/L — ABNORMAL LOW (ref 22–32)
Calcium: 8.2 mg/dL — ABNORMAL LOW (ref 8.9–10.3)
Chloride: 101 mmol/L (ref 98–111)
Creatinine, Ser: 3.79 mg/dL — ABNORMAL HIGH (ref 0.44–1.00)
GFR, Estimated: 12 mL/min — ABNORMAL LOW (ref >60)
Glucose, Bld: 225 mg/dL — ABNORMAL HIGH (ref 70–99)
Potassium: 4 mmol/L (ref 3.5–5.1)
Sodium: 135 mmol/L (ref 135–145)
Total Bilirubin: 0.2 mg/dL — ABNORMAL LOW (ref 0.3–1.2)
Total Protein: 6.4 g/dL — ABNORMAL LOW (ref 6.5–8.1)

## 2022-03-15 LAB — URINALYSIS, ROUTINE W REFLEX MICROSCOPIC
Bilirubin Urine: NEGATIVE
Glucose, UA: NEGATIVE mg/dL
Hgb urine dipstick: NEGATIVE
Ketones, ur: NEGATIVE mg/dL
Nitrite: NEGATIVE
Protein, ur: NEGATIVE mg/dL
Specific Gravity, Urine: 1.009 (ref 1.005–1.030)
pH: 5 (ref 5.0–8.0)

## 2022-03-15 LAB — CBC WITH DIFFERENTIAL/PLATELET
Abs Immature Granulocytes: 0.28 10*3/uL — ABNORMAL HIGH (ref 0.00–0.07)
Basophils Absolute: 0.1 10*3/uL (ref 0.0–0.1)
Basophils Relative: 1 %
Eosinophils Absolute: 0.4 10*3/uL (ref 0.0–0.5)
Eosinophils Relative: 3 %
HCT: 36.2 % (ref 36.0–46.0)
Hemoglobin: 10.8 g/dL — ABNORMAL LOW (ref 12.0–15.0)
Immature Granulocytes: 2 %
Lymphocytes Relative: 31 %
Lymphs Abs: 4.7 10*3/uL — ABNORMAL HIGH (ref 0.7–4.0)
MCH: 29.5 pg (ref 26.0–34.0)
MCHC: 29.8 g/dL — ABNORMAL LOW (ref 30.0–36.0)
MCV: 98.9 fL (ref 80.0–100.0)
Monocytes Absolute: 1.4 10*3/uL — ABNORMAL HIGH (ref 0.1–1.0)
Monocytes Relative: 9 %
Neutro Abs: 8.5 10*3/uL — ABNORMAL HIGH (ref 1.7–7.7)
Neutrophils Relative %: 54 %
Platelets: 448 10*3/uL — ABNORMAL HIGH (ref 150–400)
RBC: 3.66 MIL/uL — ABNORMAL LOW (ref 3.87–5.11)
RDW: 13.2 % (ref 11.5–15.5)
WBC: 15.3 10*3/uL — ABNORMAL HIGH (ref 4.0–10.5)
nRBC: 0 % (ref 0.0–0.2)

## 2022-03-15 LAB — RESP PANEL BY RT-PCR (FLU A&B, COVID) ARPGX2
Influenza A by PCR: NEGATIVE
Influenza B by PCR: NEGATIVE
SARS Coronavirus 2 by RT PCR: NEGATIVE

## 2022-03-15 LAB — CREATININE, URINE, RANDOM: Creatinine, Urine: 66.73 mg/dL

## 2022-03-15 LAB — LIPASE, BLOOD: Lipase: 55 U/L — ABNORMAL HIGH (ref 11–51)

## 2022-03-15 LAB — SODIUM, URINE, RANDOM: Sodium, Ur: 54 mmol/L

## 2022-03-15 LAB — LACTIC ACID, PLASMA: Lactic Acid, Venous: 1.3 mmol/L (ref 0.5–1.9)

## 2022-03-15 MED ORDER — METRONIDAZOLE 500 MG/100ML IV SOLN
500.0000 mg | Freq: Once | INTRAVENOUS | Status: AC
  Administered 2022-03-15: 500 mg via INTRAVENOUS
  Filled 2022-03-15: qty 100

## 2022-03-15 MED ORDER — LORATADINE 10 MG PO TABS
10.0000 mg | ORAL_TABLET | Freq: Every day | ORAL | Status: DC
  Administered 2022-03-16 – 2022-03-17 (×3): 10 mg via ORAL
  Filled 2022-03-15 (×3): qty 1

## 2022-03-15 MED ORDER — ASPIRIN EC 81 MG PO TBEC
81.0000 mg | DELAYED_RELEASE_TABLET | Freq: Every day | ORAL | Status: DC
  Administered 2022-03-16 – 2022-03-18 (×3): 81 mg via ORAL
  Filled 2022-03-15 (×3): qty 1

## 2022-03-15 MED ORDER — ENOXAPARIN SODIUM 30 MG/0.3ML IJ SOSY
30.0000 mg | PREFILLED_SYRINGE | Freq: Every day | INTRAMUSCULAR | Status: DC
  Administered 2022-03-16 – 2022-03-18 (×3): 30 mg via SUBCUTANEOUS
  Filled 2022-03-15 (×3): qty 0.3

## 2022-03-15 MED ORDER — SODIUM CHLORIDE 0.9 % IV SOLN
2.0000 g | Freq: Once | INTRAVENOUS | Status: AC
  Administered 2022-03-15: 2 g via INTRAVENOUS
  Filled 2022-03-15: qty 20

## 2022-03-15 MED ORDER — SODIUM CHLORIDE 0.9 % IV BOLUS
1000.0000 mL | Freq: Once | INTRAVENOUS | Status: AC
  Administered 2022-03-15: 1000 mL via INTRAVENOUS

## 2022-03-15 MED ORDER — FLUTICASONE-UMECLIDIN-VILANT 100-62.5-25 MCG/INH IN AEPB: 1.0000 | INHALATION_SPRAY | Freq: Every day | RESPIRATORY_TRACT | Status: DC

## 2022-03-15 MED ORDER — INSULIN GLARGINE-YFGN 100 UNIT/ML ~~LOC~~ SOLN
20.0000 [IU] | Freq: Every day | SUBCUTANEOUS | Status: DC
  Administered 2022-03-16 – 2022-03-18 (×3): 20 [IU] via SUBCUTANEOUS
  Filled 2022-03-15 (×5): qty 0.2

## 2022-03-15 MED ORDER — LACTATED RINGERS IV BOLUS
1000.0000 mL | Freq: Once | INTRAVENOUS | Status: AC
  Administered 2022-03-15: 1000 mL via INTRAVENOUS

## 2022-03-15 MED ORDER — LACTATED RINGERS IV BOLUS (SEPSIS)
500.0000 mL | Freq: Once | INTRAVENOUS | Status: AC
  Administered 2022-03-15: 500 mL via INTRAVENOUS

## 2022-03-15 MED ORDER — MONTELUKAST SODIUM 10 MG PO TABS
10.0000 mg | ORAL_TABLET | Freq: Every day | ORAL | Status: DC
  Administered 2022-03-16 – 2022-03-18 (×3): 10 mg via ORAL
  Filled 2022-03-15 (×3): qty 1

## 2022-03-15 MED ORDER — LACTATED RINGERS IV SOLN: INTRAVENOUS | Status: DC

## 2022-03-15 MED ORDER — FENTANYL CITRATE PF 50 MCG/ML IJ SOSY
50.0000 ug | PREFILLED_SYRINGE | Freq: Once | INTRAMUSCULAR | Status: AC
Start: 2022-03-15 — End: 2022-03-15
  Administered 2022-03-15: 50 ug via INTRAVENOUS
  Filled 2022-03-15: qty 1

## 2022-03-15 MED ORDER — ROSUVASTATIN CALCIUM 20 MG PO TABS
20.0000 mg | ORAL_TABLET | Freq: Every day | ORAL | Status: DC
  Administered 2022-03-16 – 2022-03-17 (×3): 20 mg via ORAL
  Filled 2022-03-15 (×3): qty 1

## 2022-03-15 MED ORDER — LACTATED RINGERS IV SOLN: INTRAVENOUS | Status: AC

## 2022-03-15 MED ORDER — ONDANSETRON HCL 4 MG/2ML IJ SOLN
4.0000 mg | Freq: Once | INTRAMUSCULAR | Status: DC
  Filled 2022-03-15: qty 2

## 2022-03-15 NOTE — Sepsis Progress Note (Signed)
Sepsis protocol is being followed by [facility]. 

## 2022-03-15 NOTE — H&P (Addendum)
Family Medicine Teaching Service ?Hospital Admission History and Physical ?Service Pager: [phone] ? ?Patient name: Pam Gamble Medical record number: [id number] ?Date of birth: [birth year] Age: 76 y.o. Gender: female ? ?Primary Care Provider: Serita Grammes, MD ?Consultants: None ?Code Status: DNR ?Preferred Emergency Contact: Sabino Dick, [phone] ? ?Chief Complaint: Renal failure, colitis ? ?Assessment and Plan: ?Pam Gamble is a 77 y.o. female presenting with worsening kidney function, bilateral flank pain, abdominal pain, and nausea. PMH is significant for COPD, DM, MDD, GERD, Barrett's Esophagus, renal stones, uterine adenocarcinoma s/p partial hysterectomy.  ? ?Sepsis R/u  Concern for UTI/pyelonephritis  Resolving Colitis ?Patient admitted for abdominal pain, flank pain, and worsening renal function. In the ED patient afebrile with BP 72/47 pulse 91 respiratory rate 15 O2 sat 96%.  Labs significant for Cr 3.79, BUN 71, glucose 225, lipase 55, GFR 12, WBC 15.3, Hgb 10.8, with UA showing trace leukocytes and rare bacteria. She had a negative CXR, with CT abdomen significant for mild residual bowel wall thickening in proximal descending colon, improved from prior study, moderate to large volume stool throughout colon, tiny bubble of air in urinary bladder, nonobstructing renal stone.  With her blood pressure and white blood cell count she met sepsis criteria, received 2.5 L of IV fluids, and was given one dose of metronidazole and ceftriaxone. Etiology could be 2/2 renal causes, with concern for UTI/pyelonephritis, given UA, complaints of increased urinary frequency/urgency and flank pain. Patient reports she was dx with UTI by PCP on Monday, despite being on levaquin and flagyl since discharge from [facility] on Monday. Other source could be colitis, given patient recent hospital admission, abdominal symptoms, and CT imaging, although colitis likely improving (see below) given  improved WBC since [facility] discharge.  ?- Admit to FPTS attending Dr. Sherren Mocha McDiarmid ?- Vitals per unit ?- MIVF at 100 cc/h, until 7 a.m. ?- Cardiac monitoring ?- UA, Urine Cx ?- Follow-up blood Cx ?- AM BMP, CBC ?- Continue CFTX, Flagyl ?- Tylenol 1000 mg q6h prn ?- Bland diet ?- DVT prophylaxis, renally dosed Lovenox ? ?Acute renal failure  ?Patient admitted for worsening renal function/acute renal failure, likely secondary to dehydration (given history of poor hydration), or some intrinsic renal pathology, less likely to be obstructive post renal issue as patient has urinated (less frequently than before), and CT abdomen pelvis showed no concerns for obstruction.  On review of patient's charts on her cell phone, she recently had serum creatinine ranging from 1-1.4. ?- Consider nephrology consult ?- Bladder scans Q8 ?- FE Na labs (urine creatinine, urine sodium) ?- AM BMP ?-Avoid nephrotoxic medications, will hold home lisinopril, torsemide ? ?Colitis ?Patient with recent hospitalization, last week, at [facility] for colitis. She was discharged on Levaquin 500 mg daily and Flagyl 500 mg daily. Unsure how long she was supposed to continue abx course, however CT abdomen pelvis demonstrating mild residual bowel wall thickening within the proximal descending colon. Patient notes from [facility], demonstrated a WBC was 17 on discharged, now 15 on admission. Trend suggesting improvement in colitis. Patient has also been on levo and flagyl since discharge.  Will continue flagyl in setting of continued abdominal pain and likely resolving colitis.  ?-Continue Flagyl 500 mg BID ?-Soft, bland diet ? ?HFpEF ?Patient with history of HFpEF, last echo 02/08/2021 with EF 60 to 65%, and grade 2 diastolic dysfunction. Patient taking Torsemide 10 mg daily, and Lisinopril 10 mg at home. Patient volume status euvolemic, with stable respiratory status, does not  appear to be in HF exacerbation at this time. ?- Daily  weights ?- Strict ins and outs ?-Hold home torsemide, lisinopril for ARF as above ? ?COPD ?Patient with history of COPD per chart review, on trelegy Ellipta, albuterol.  Not on oxygen, O2 sats appropriate, does not appear to be in COPD exacerbation at this time ?- Continue Trelegy ? ?DM ?Patient taking Tyler Aas 30 units daily, ozempic on Saturdays, and has a CGM- libre 2. Last a1c 7.8%.  We will continue Tresiba at decreased dose, and hold Ozempic in setting of abdominal pain. ?- Tresiba 20 units daily ?- CBG checks through CGM ?-sSSI ? ?MDD ?History of depression on chart review, not on medications.  We will continue to monitor. ?- Continue to monitor ? ?HLD ?Patient on Crestor 20, last lipid panel 12/12/16 w/ T. Chol 228, LDL 153 ?- Consider lipid panel ?- Continue Crestor 20 mg daily ? ?GERD  Barrett's Esophagus ?Patient with history of GERD and BE on chart review, not on medications. ?-Monitor for symptoms ? ?FEN/GI: Soft diet ?Prophylaxis: Lovenox renally dosed ? ?Disposition: Admit to med telemetry ? ?History of Present Illness:  Pam Gamble is a 76 y.o. female presenting with acute renal failure, severe dehydration, abdominal and flank pain. ? ?Patient was admitted to [facility] from Friday to Monday of last week for colitis, treated with abx (flagyl and levofloxacin) and soft diet. She followed up with her PCP Friday 5/5, Serita Grammes, who noted significant worsening of renal function, and  she was advised to go to the ED. Since discharge earlier this week she reports that she has not had a bowel movement since last Thursday, she has not been able to urinate like she usually does, feels ashen, weak, dehydrated and fatigued.  Denies any fever nausea vomiting, but complains of abdominal pain, flank pain, increased urinary frequency/urgency. ? ?Review Of Systems: Per HPI with the following additions:  ? ?Review of Systems  ?Constitutional:  Positive for fatigue. Negative for chills and fever.   ?Respiratory:  Negative for cough and shortness of breath.   ?Cardiovascular:  Negative for chest pain.  ?Gastrointestinal:  Positive for abdominal pain and constipation. Negative for abdominal distention, diarrhea, nausea and vomiting.  ?Genitourinary:  Positive for difficulty urinating and flank pain. Negative for dysuria and pelvic pain.  ?Neurological:  Positive for weakness.   ? ?Patient Active Problem List  ? Diagnosis Date Noted  ? Renal failure 03/15/2022  ? Idiopathic thrombocythemia ([place]) 07/12/2021  ? Depression   ? Kidney stones   ? Shortness of breath   ? Mixed dyslipidemia 03/28/2020  ? Dyspnea on exertion 06/22/2019  ? Diabetes mellitus due to underlying condition with unspecified complications ([place]) 73/42/8768  ? Ex-smoker 06/22/2019  ? Chest discomfort 06/22/2019  ? Pericardial effusion 06/22/2019  ? Acute biliary pancreatitis 10/01/2018  ? Acute cholangitis 10/01/2018  ? COPD (chronic obstructive pulmonary disease) ([place]) 02/09/2018  ? Low back pain 04/25/2017  ? Tick bite 01/18/2017  ? Noncompliance 09/10/2016  ? Atrophic vaginitis 05/03/2015  ? Rotator cuff arthropathy 03/21/2015  ? Edema 09/09/2014  ? Kidney stone 07/27/2014  ? Abdominal pain, unspecified site 07/12/2014  ? Fever, unspecified 07/12/2014  ? Leg cramp 04/15/2014  ? Diabetes type 2, uncontrolled 12/27/2013  ? 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 Cough 05/23/2012  ? Pyelonephritis, acute 02/26/2011  ? Acute non-recurrent maxillary sinusitis 01/12/2011  ? C O P D WITH ACUTE EXACERBATION 03/21/2010  ? TOBACCO USE, QUIT 01/10/2010  ? Major depressive disorder, recurrent episode (  [place]) 06/14/2009  ? CAROTID BRUIT 02/09/2008  ? Allergic rhinitis 01/08/2008  ? GASTROESOPHAGEAL REFLUX DISEASE 09/26/2007  ? LATERAL EPICONDYLITIS 09/26/2007  ? CALCIFIC TENDINITIS 09/26/2007  ? ADENOCARCINOMA, UTERUS, HX OF 09/26/2007  ? HIATAL HERNIA, HX OF 09/26/2007  ? HYSTERECTOMY, PARTIAL, HX OF 09/26/2007  ? COPD, frequent exacerbations ([place]) 09/15/2007  ?  BARRETTS ESOPHAGUS 09/15/2007  ? ? ?Past Medical History: ?Past Medical History:  ?Diagnosis Date  ? Abdominal pain, unspecified site 07/12/2014  ? 8/15 lower R>L ? etiol - r/o pyelo and others   ? Acute biliary pancreat

## 2022-03-15 NOTE — ED Triage Notes (Signed)
The pt was sent here from her doctors office she was just in a hospital  in the past 3 days  she saw her doctor today and she was sent here ?

## 2022-03-15 NOTE — ED Provider Notes (Signed)
?[facility] ?Provider Note ? ? ?CSN: [id number] ?Arrival date & time: 03/15/22  1508 ? ?  ? ?History ? ?Chief Complaint  ?Patient presents with  ? Flank Pain  ? ? ?Pam Gamble is a 76 y.o. female. ? ?Patient with a history of diabetes, COPD, recent hospitalization at outside hospital for colitis sent in by PCP with worsening kidney function.  States her doctor sent her in today because of renal failure and worsening kidney function.  She was discharged from the hospital approximately 5 days ago and is currently being treated with colitis with unknown antibiotics.  She reports bilateral flank pain, abdominal pain, nausea but no vomiting.  Has not had a bowel movement since she left the hospital other than a small amount earlier today.  She is still passing gas.Earlier today.  She is still passing gas. Has had decreased urination and was told she had a kidney stone on her last CT scan stones in her kidney kidney she denies any chest pain or shortness of breath.  No fever.  Blood pressure noted to be low in triage ? ?The history is provided by the patient.  ?Flank Pain ?Associated symptoms include abdominal pain. Pertinent negatives include no chest pain, no headaches and no shortness of breath.  ? ?  ? ?Home Medications ?Prior to Admission medications   ?Medication Sig Start Date End Date Taking? Authorizing Provider  ?albuterol (PROVENTIL) (2.5 MG/3ML) 0.083% nebulizer solution Take 2.5 mg by nebulization every 8 (eight) hours. 02/08/21   [provider]  ?aspirin EC 81 MG tablet Take 81 mg by mouth daily.    [provider]  ?Azelastine HCl 137 MCG/SPRAY SOLN Place 2 sprays into both nostrils 2 (two) times daily. 03/21/20   [provider]  ?Cholecalciferol (VITAMIN D3) 25 MCG (1000 UT) CAPS Take 1,000 Units by mouth daily.    [provider]  ?Cyanocobalamin (B-12 PO) Take 1 tablet by mouth daily.    [provider]   ?Fluticasone-Umeclidin-Vilant (TRELEGY ELLIPTA) 100-62.5-25 MCG/INH AEPB Inhale 1 puff into the lungs daily. 02/09/18   [name], NP  ?levocetirizine (XYZAL) 5 MG tablet Take 5 mg by mouth at bedtime. 07/05/21   [provider]  ?lisinopril (ZESTRIL) 10 MG tablet Take 10 mg by mouth daily. 02/10/20   [provider]  ?montelukast (SINGULAIR) 10 MG tablet Take 10 mg by mouth daily. 07/05/21   [provider]  ?omeprazole (PRILOSEC) 40 MG capsule Take 40 mg by mouth 2 (two) times daily.    [provider]  ?OZEMPIC, 1 MG/DOSE, 4 MG/3ML SOPN Inject 1 mg into the skin once a week. 04/08/21   [provider]  ?rOPINIRole (REQUIP) 2 MG tablet Take 2 mg by mouth at bedtime. 03/09/20   [provider]  ?rosuvastatin (CRESTOR) 20 MG tablet Take 20 mg by mouth at bedtime. 07/05/21   [provider]  ?TRESIBA FLEXTOUCH 100 UNIT/ML FlexTouch Pen Inject 30 Units into the skin daily. 01/02/21   [provider]  ?vitamin C (ASCORBIC ACID) 500 MG tablet Take 500 mg by mouth daily.    [provider]  ?   ? ?Allergies    ?Amoxicillin-pot clavulanate, Levaquin [levofloxacin], and Ertugliflozin   ? ?Review of Systems   ?Review of Systems  ?Constitutional:  Positive for activity change and appetite change. Negative for fever.  ?HENT:  Negative for congestion.   ?Respiratory:  Negative for cough, chest tightness and shortness of breath.   ?  Cardiovascular:  Negative for chest pain.  ?Gastrointestinal:  Positive for abdominal pain, constipation, nausea and vomiting. Negative for diarrhea.  ?Genitourinary:  Positive for decreased urine volume, difficulty urinating, dysuria, flank pain and urgency.  ?Musculoskeletal:  Positive for arthralgias, back pain and myalgias.  ?Skin:  Negative for rash.  ?Neurological:  Negative for dizziness, weakness and headaches.  ? all other systems are negative except as noted in the HPI and PMH.  ? ?Physical Exam ?Updated Vital  Signs ?BP (!) 85/57   Pulse 87   Temp 97.7 ?F (36.5 ?C) (Oral)   Resp 15   Ht '5\' 2"'$  (1.575 m)   Wt 71.7 kg   SpO2 96%   BMI 28.91 kg/m?  ?Physical Exam ?Vitals and nursing note reviewed.  ?Constitutional:   ?   General: She is not in acute distress. ?   Appearance: She is well-developed. She is ill-appearing.  ?HENT:  ?   Head: Normocephalic and atraumatic.  ?   Mouth/Throat:  ?   Pharynx: No oropharyngeal exudate.  ?Eyes:  ?   Conjunctiva/sclera: Conjunctivae normal.  ?   Pupils: Pupils are equal, round, and reactive to light.  ?Neck:  ?   Comments: No meningismus. ?Cardiovascular:  ?   Rate and Rhythm: Normal rate and regular rhythm.  ?   Heart sounds: Normal heart sounds. No murmur heard. ?Pulmonary:  ?   Effort: Pulmonary effort is normal. No respiratory distress.  ?   Breath sounds: Normal breath sounds.  ?Abdominal:  ?   General: There is distension.  ?   Palpations: Abdomen is soft.  ?   Tenderness: There is abdominal tenderness. There is no guarding or rebound.  ?   Comments: Diffuse tenderness with voluntary guarding throughout  ?Musculoskeletal:     ?   General: Tenderness present.  ?   Cervical back: Normal range of motion and neck supple.  ?   Comments:  ?Paraspinal tenderness bilaterally  ?Skin: ?   General: Skin is warm.  ?Neurological:  ?   Mental Status: She is alert and oriented to person, place, and time.  ?   Cranial Nerves: No cranial nerve deficit.  ?   Motor: No abnormal muscle tone.  ?   Coordination: Coordination normal.  ?   Comments:  5/5 strength throughout. CN 2-12 intact.Equal grip strength.   ?Psychiatric:     ?   Behavior: Behavior normal.  ? ? ?ED Results / Procedures / Treatments   ?Labs ?(all labs ordered are listed, but only abnormal results are displayed) ?Labs Reviewed  ?CBC WITH DIFFERENTIAL/PLATELET - Abnormal; Notable for the following components:  ?    Result Value  ? WBC 15.3 (*)   ? RBC 3.66 (*)   ? Hemoglobin 10.8 (*)   ? MCHC 29.8 (*)   ? Platelets 448 (*)   ?  Neutro Abs 8.5 (*)   ? Lymphs Abs 4.7 (*)   ? Monocytes Absolute 1.4 (*)   ? Abs Immature Granulocytes 0.28 (*)   ? All other components within normal limits  ?COMPREHENSIVE METABOLIC PANEL - Abnormal; Notable for the following components:  ? CO2 20 (*)   ? Glucose, Bld 225 (*)   ? BUN 71 (*)   ? Creatinine, Ser 3.79 (*)   ? Calcium 8.2 (*)   ? Total Protein 6.4 (*)   ? Albumin 3.1 (*)   ? Total Bilirubin 0.2 (*)   ? GFR, Estimated 12 (*)   ? All other components  within normal limits  ?LIPASE, BLOOD - Abnormal; Notable for the following components:  ? Lipase 55 (*)   ? All other components within normal limits  ?CULTURE, BLOOD (ROUTINE X 2)  ?CULTURE, BLOOD (ROUTINE X 2)  ?RESP PANEL BY RT-PCR (FLU A&B, COVID) ARPGX2  ?LACTIC ACID, PLASMA  ?URINALYSIS, ROUTINE W REFLEX MICROSCOPIC  ?LACTIC ACID, PLASMA  ? ? ?EKG ?EKG Interpretation ? ?Date/Time:  Friday Mar 15 2022 15:26:50 EDT ?Ventricular Rate:  92 ?PR Interval:  128 ?QRS Duration: 70 ?QT Interval:  314 ?QTC Calculation: 388 ?R Axis:   39 ?Text Interpretation: Normal sinus rhythm Nonspecific T wave abnormality Abnormal ECG When compared with ECG of 04-Jun-2012 07:48, PREVIOUS ECG IS PRESENT No significant change was found Confirmed by Ezequiel Essex [phone]) on 03/15/2022 5:04:52 PM ? ?Radiology ?CT ABDOMEN PELVIS WO CONTRAST ? ?Result Date: 03/15/2022 ?CLINICAL DATA:  Abdominal pain, acute, nonlocalized recent colitis, now with AKI, flank pain, hypotension EXAM: CT ABDOMEN AND PELVIS WITHOUT CONTRAST TECHNIQUE: Multidetector CT imaging of the abdomen and pelvis was performed following the standard protocol without IV contrast. RADIATION DOSE REDUCTION: This exam was performed according to the departmental dose-optimization program which includes automated exposure control, adjustment of the mA and/or kV according to patient size and/or use of iterative reconstruction technique. COMPARISON:  03/08/2022 FINDINGS: Lower chest: Dependent right basilar atelectasis.  Hepatobiliary: No focal liver abnormality is seen on noncontrast images. Status post cholecystectomy. No biliary dilatation. Pancreas: Unremarkable. No pancreatic ductal dilatation or surrounding inflammatory changes. Spleen: Prior s

## 2022-03-15 NOTE — ED Notes (Signed)
Patient transported to CT 

## 2022-03-15 NOTE — ED Provider Triage Note (Signed)
Emergency Medicine Provider Triage Evaluation Note ? ?Pam Gamble , a 76 y.o. female  was evaluated in triage.  Pt complains of "worsening kidney function, not improving".  Patient states she was sent here by her doctor due to worsening kidney function.  Patient reports that she was hospitalized last Friday to this Monday for colitis.  Patient also states that she has not had a bowel movement since last Thursday.  Patient endorsing decreased urination.  Patient also endorsing bilateral upper flank pain.  Patient states that she recently had CT scan done which showed left-sided kidney stone however denies any flank pain.  Patient denies nausea, vomiting.  Patient denies any fevers. ? ?Review of Systems  ?Positive:  ?Negative:  ? ?Physical Exam  ?BP (!) 85/57   Pulse 87   Temp 97.7 ?F (36.5 ?C) (Oral)   Resp 15   Ht '5\' 2"'$  (1.575 m)   Wt 71.7 kg   SpO2 96%   BMI 28.91 kg/m?  ?Gen:   Awake, no distress   ?Resp:  Normal effort  ?MSK:   Moves extremities without difficulty  ?Other:  Left-sided CVA tenderness. ? ?Medical Decision Making  ?Medically screening exam initiated at 3:34 PM.  Appropriate orders placed.  Pam Gamble was informed that the remainder of the evaluation will be completed by another provider, this initial triage assessment does not replace that evaluation, and the importance of remaining in the ED until their evaluation is complete. ? ? ?  ?Azucena Cecil, PA-C ?03/15/22 1535 ? ?

## 2022-03-16 DIAGNOSIS — J449 Chronic obstructive pulmonary disease, unspecified: Secondary | ICD-10-CM | POA: Diagnosis present

## 2022-03-16 DIAGNOSIS — N179 Acute kidney failure, unspecified: Secondary | ICD-10-CM | POA: Diagnosis present

## 2022-03-16 DIAGNOSIS — Z66 Do not resuscitate: Secondary | ICD-10-CM | POA: Diagnosis present

## 2022-03-16 DIAGNOSIS — N12 Tubulo-interstitial nephritis, not specified as acute or chronic: Secondary | ICD-10-CM | POA: Diagnosis present

## 2022-03-16 DIAGNOSIS — F329 Major depressive disorder, single episode, unspecified: Secondary | ICD-10-CM | POA: Diagnosis present

## 2022-03-16 DIAGNOSIS — E860 Dehydration: Secondary | ICD-10-CM | POA: Diagnosis present

## 2022-03-16 DIAGNOSIS — N19 Unspecified kidney failure: Secondary | ICD-10-CM

## 2022-03-16 DIAGNOSIS — Z20822 Contact with and (suspected) exposure to covid-19: Secondary | ICD-10-CM | POA: Diagnosis present

## 2022-03-16 DIAGNOSIS — E875 Hyperkalemia: Secondary | ICD-10-CM | POA: Diagnosis present

## 2022-03-16 DIAGNOSIS — N183 Chronic kidney disease, stage 3 unspecified: Secondary | ICD-10-CM | POA: Diagnosis not present

## 2022-03-16 DIAGNOSIS — Z800 Family history of malignant neoplasm of digestive organs: Secondary | ICD-10-CM | POA: Diagnosis not present

## 2022-03-16 DIAGNOSIS — Z833 Family history of diabetes mellitus: Secondary | ICD-10-CM | POA: Diagnosis not present

## 2022-03-16 DIAGNOSIS — K5900 Constipation, unspecified: Secondary | ICD-10-CM | POA: Diagnosis present

## 2022-03-16 DIAGNOSIS — I5032 Chronic diastolic (congestive) heart failure: Secondary | ICD-10-CM | POA: Diagnosis present

## 2022-03-16 DIAGNOSIS — Z9081 Acquired absence of spleen: Secondary | ICD-10-CM | POA: Diagnosis not present

## 2022-03-16 DIAGNOSIS — N189 Chronic kidney disease, unspecified: Secondary | ICD-10-CM | POA: Diagnosis present

## 2022-03-16 DIAGNOSIS — J309 Allergic rhinitis, unspecified: Secondary | ICD-10-CM | POA: Diagnosis present

## 2022-03-16 DIAGNOSIS — Z823 Family history of stroke: Secondary | ICD-10-CM | POA: Diagnosis not present

## 2022-03-16 DIAGNOSIS — Z87891 Personal history of nicotine dependence: Secondary | ICD-10-CM | POA: Diagnosis not present

## 2022-03-16 DIAGNOSIS — Z79899 Other long term (current) drug therapy: Secondary | ICD-10-CM | POA: Diagnosis not present

## 2022-03-16 DIAGNOSIS — K219 Gastro-esophageal reflux disease without esophagitis: Secondary | ICD-10-CM | POA: Diagnosis present

## 2022-03-16 DIAGNOSIS — R1013 Epigastric pain: Secondary | ICD-10-CM

## 2022-03-16 DIAGNOSIS — E782 Mixed hyperlipidemia: Secondary | ICD-10-CM | POA: Diagnosis present

## 2022-03-16 DIAGNOSIS — Z7951 Long term (current) use of inhaled steroids: Secondary | ICD-10-CM | POA: Diagnosis not present

## 2022-03-16 DIAGNOSIS — Z8542 Personal history of malignant neoplasm of other parts of uterus: Secondary | ICD-10-CM | POA: Diagnosis not present

## 2022-03-16 DIAGNOSIS — Z8249 Family history of ischemic heart disease and other diseases of the circulatory system: Secondary | ICD-10-CM | POA: Diagnosis not present

## 2022-03-16 DIAGNOSIS — E1122 Type 2 diabetes mellitus with diabetic chronic kidney disease: Secondary | ICD-10-CM | POA: Diagnosis present

## 2022-03-16 HISTORY — DX: Unspecified kidney failure: N19

## 2022-03-16 LAB — BASIC METABOLIC PANEL
Anion gap: 7 (ref 5–15)
BUN: 47 mg/dL — ABNORMAL HIGH (ref 8–23)
CO2: 22 mmol/L (ref 22–32)
Calcium: 7.9 mg/dL — ABNORMAL LOW (ref 8.9–10.3)
Chloride: 108 mmol/L (ref 98–111)
Creatinine, Ser: 2.13 mg/dL — ABNORMAL HIGH (ref 0.44–1.00)
GFR, Estimated: 24 mL/min — ABNORMAL LOW (ref >60)
Glucose, Bld: 168 mg/dL — ABNORMAL HIGH (ref 70–99)
Potassium: 4.5 mmol/L (ref 3.5–5.1)
Sodium: 137 mmol/L (ref 135–145)

## 2022-03-16 LAB — GLUCOSE, CAPILLARY
Glucose-Capillary: 85 mg/dL (ref 70–99)
Glucose-Capillary: 96 mg/dL (ref 70–99)
Glucose-Capillary: 123 mg/dL — ABNORMAL HIGH (ref 70–99)
Glucose-Capillary: 92 mg/dL (ref 70–99)
Glucose-Capillary: 140 mg/dL — ABNORMAL HIGH (ref 70–99)

## 2022-03-16 LAB — LACTIC ACID, PLASMA: Lactic Acid, Venous: 1 mmol/L (ref 0.5–1.9)

## 2022-03-16 MED ORDER — ACETAMINOPHEN 500 MG PO TABS
1000.0000 mg | ORAL_TABLET | Freq: Four times a day (QID) | ORAL | Status: DC | PRN
  Administered 2022-03-16 – 2022-03-17 (×2): 1000 mg via ORAL
  Filled 2022-03-16 (×2): qty 2

## 2022-03-16 MED ORDER — SIMETHICONE 80 MG PO CHEW
80.0000 mg | CHEWABLE_TABLET | Freq: Four times a day (QID) | ORAL | Status: DC | PRN
  Administered 2022-03-16: 80 mg via ORAL
  Filled 2022-03-16 (×2): qty 1

## 2022-03-16 MED ORDER — FLUTICASONE FUROATE-VILANTEROL 100-25 MCG/ACT IN AEPB
1.0000 | INHALATION_SPRAY | Freq: Every day | RESPIRATORY_TRACT | Status: DC
  Administered 2022-03-16 – 2022-03-18 (×3): 1 via RESPIRATORY_TRACT
  Filled 2022-03-16: qty 28

## 2022-03-16 MED ORDER — ONDANSETRON 4 MG PO TBDP
4.0000 mg | ORAL_TABLET | Freq: Once | ORAL | Status: AC
  Administered 2022-03-16: 4 mg via ORAL
  Filled 2022-03-16: qty 1

## 2022-03-16 MED ORDER — METRONIDAZOLE 500 MG/100ML IV SOLN
500.0000 mg | Freq: Two times a day (BID) | INTRAVENOUS | Status: DC
Start: 2022-03-16 — End: 2022-03-16
  Administered 2022-03-16: 500 mg via INTRAVENOUS
  Filled 2022-03-16: qty 100

## 2022-03-16 MED ORDER — POLYETHYLENE GLYCOL 3350 17 G PO PACK
17.0000 g | PACK | Freq: Two times a day (BID) | ORAL | Status: DC
  Administered 2022-03-17: 17 g via ORAL
  Filled 2022-03-16 (×2): qty 1

## 2022-03-16 MED ORDER — ROPINIROLE HCL 1 MG PO TABS
2.0000 mg | ORAL_TABLET | Freq: Every day | ORAL | Status: DC
  Administered 2022-03-16 – 2022-03-17 (×3): 2 mg via ORAL
  Filled 2022-03-16 (×3): qty 2

## 2022-03-16 MED ORDER — SODIUM CHLORIDE 0.9 % IV SOLN: INTRAVENOUS | Status: AC

## 2022-03-16 MED ORDER — SODIUM CHLORIDE 0.9 % IV SOLN: 2.0000 g | INTRAVENOUS | Status: DC

## 2022-03-16 MED ORDER — SENNOSIDES-DOCUSATE SODIUM 8.6-50 MG PO TABS
2.0000 | ORAL_TABLET | Freq: Every day | ORAL | Status: DC
  Administered 2022-03-16: 2 via ORAL
  Filled 2022-03-16: qty 2

## 2022-03-16 MED ORDER — ONDANSETRON HCL 4 MG/2ML IJ SOLN
4.0000 mg | Freq: Once | INTRAMUSCULAR | Status: AC
  Administered 2022-03-16: 4 mg via INTRAVENOUS
  Filled 2022-03-16: qty 2

## 2022-03-16 MED ORDER — UMECLIDINIUM BROMIDE 62.5 MCG/ACT IN AEPB
1.0000 | INHALATION_SPRAY | Freq: Every day | RESPIRATORY_TRACT | Status: DC
  Administered 2022-03-16 – 2022-03-18 (×3): 1 via RESPIRATORY_TRACT
  Filled 2022-03-16: qty 7

## 2022-03-16 MED ORDER — HYDROMORPHONE HCL 1 MG/ML IJ SOLN
0.5000 mg | Freq: Once | INTRAMUSCULAR | Status: AC
  Administered 2022-03-16: 0.5 mg via INTRAVENOUS
  Filled 2022-03-16: qty 1

## 2022-03-16 MED ORDER — POLYETHYLENE GLYCOL 3350 17 G PO PACK: 17.0000 g | PACK | Freq: Every day | ORAL | Status: DC

## 2022-03-16 MED ORDER — INSULIN ASPART 100 UNIT/ML IJ SOLN
0.0000 [IU] | Freq: Three times a day (TID) | INTRAMUSCULAR | Status: DC
  Administered 2022-03-16 – 2022-03-17 (×2): 1 [IU] via SUBCUTANEOUS
  Administered 2022-03-18: 2 [IU] via SUBCUTANEOUS

## 2022-03-16 NOTE — Progress Notes (Signed)
Family Medicine Teaching Service ?Daily Progress Note ?Intern Pager: [phone] ? ?Patient name: Pam Gamble Medical record number: [id number] ?Date of birth: 07/14/1946 Age: 76 y.o. Gender: female ? ?Primary Care Provider: Serita Grammes, MD ?Consultants: None ?Code Status: DNR ? ?Pt Overview and Major Events to Date:  ?5/5 Admitted  ? ?Assessment and Plan: ?Pam Gamble is a 76 y.o. female presenting with worsening kidney function, bilateral flank pain, abdominal pain, and nausea. PMH is significant for COPD, DM, MDD, GERD, Barrett's Esophagus, renal stones, uterine adenocarcinoma s/p partial hysterectomy.  ? ? ?Sepsis R/u  Concern for UTI/pyelonephritis  Resolving Colitis ?Recent hospitalization last week at [facility] and was discharged on Levaquin 500 mg daily and Flagyl 500 mg daily, unsure how long abx course is for. CT abdomen pelvis on admission showing mild residual bowel wall thickening withing proximal descending colon improved from prior CT. Diagnosed with UTI at PCP office despite being on Levaquin and Flagyl since 5/1 vs potential Colitis  ?UA with trace leuks, rare bacteria  ?- con't CTX and Flagyl, consider  d/c CTX tomorrow 5/7 given improvement in colitis  ?- f/u urine culture ?- f/u blood culture   ?- am CBC, BMP ?- mIVF 174m/h  ?- soft bland diet  ? ?Acute renal failure, improving   ?Cr 2.13 down from 3.79 on admission. Per patient's records on her phone her recent Cr has ranged from 1-1.4. Unlikely obstruction as patient had 840 ml output overnight. Potentially due to dehydration given Cr improving after receiving fluids.  ?- continue mIVF ?- am BMP ?- avoid nephrotoxic agents  ? ?HFpEF ?Last echo 3/31 with EF 60-65%, G2DD. Home medication includes Torsemide '10mg'$  daily, Lisinopril '10mg'$   ?Patient euvolemic on exam   ?- daily weights ?- strict Is and Os  ? ?COPD  ?Breathing comfortably on RA ?- continue Trelegy ? ?DM ?Am CBG 96. Patient with continuous glucose monitor  ?-Tyler Aas20u  daily ? ?HLD ?- continue Crestor '20mg'$   ? ?FEN/GI: soft diet ?PPx: Lovenox  ? ?Disposition: Med-tele  ? ?Subjective:  ?No acute events overnight. Patient states she feels a lot better than she did yesterday. Endorses some mild lower abdominal pain. States she only had a very small bowel movement yesterday. Denies any questions or concerns  ? ?Objective: ?Temp:  [97.7 ?F (36.5 ?C)-98.8 ?F (37.1 ?C)] 98.6 ?F (37 ?C) (05/06 0549) ?Pulse Rate:  [63-91] 80 (05/06 0549) ?Resp:  [13-21] 18 (05/06 0549) ?BP: (72-130)/(38-64) 106/47 (05/06 0549) ?SpO2:  [95 %-100 %] 95 % (05/06 0549) ?Weight:  [71.7 kg-72.5 kg] 72.5 kg (05/06 0210) ?Physical Exam: ?General: alert, sitting in chair, pleasant, NAD  ?Cardiovascular: RRR no murmurs  ?Respiratory: CTAB normal WOB  ?Abdomen: soft, mildly distended, mild TTP in LLQ ?Extremities: warm, dry. No LE edema  ? ?Laboratory: ?Recent Labs  ?Lab 03/15/22 ?1548  ?WBC 15.3*  ?HGB 10.8*  ?HCT 36.2  ?PLT 448*  ? ?Recent Labs  ?Lab 03/15/22 ?1548  ?NA 135  ?K 4.0  ?CL 101  ?CO2 20*  ?BUN 71*  ?CREATININE 3.79*  ?CALCIUM 8.2*  ?PROT 6.4*  ?BILITOT 0.2*  ?ALKPHOS 53  ?ALT 19  ?AST 34  ?GLUCOSE 225*  ? ? ? ?Imaging/Diagnostic Tests: ?CT ABDOMEN PELVIS WO CONTRAST ? ?Result Date: 03/15/2022 ?CLINICAL DATA:  Abdominal pain, acute, nonlocalized recent colitis, now with AKI, flank pain, hypotension EXAM: CT ABDOMEN AND PELVIS WITHOUT CONTRAST TECHNIQUE: Multidetector CT imaging of the abdomen and pelvis was performed following the standard protocol without IV contrast.  RADIATION DOSE REDUCTION: This exam was performed according to the departmental dose-optimization program which includes automated exposure control, adjustment of the mA and/or kV according to patient size and/or use of iterative reconstruction technique. COMPARISON:  03/08/2022 FINDINGS: Lower chest: Dependent right basilar atelectasis. Hepatobiliary: No focal liver abnormality is seen on noncontrast images. Status post cholecystectomy. No  biliary dilatation. Pancreas: Unremarkable. No pancreatic ductal dilatation or surrounding inflammatory changes. Spleen: Prior splenectomy with small amount of residual splenic tissue in the left upper quadrant, unchanged. Adrenals/Urinary Tract: Unremarkable adrenal glands. 3 mm nonobstructing stone within the lower pole of the right kidney. No left-sided renal calculi. No hydronephrosis. Ureters are unremarkable. Tiny bubble of air within the urinary bladder. Bladder appears otherwise within normal limits. Stomach/Bowel: Stomach is within normal limits. Moderate-large volume of stool throughout the colon. No dilated loops of bowel. Mild residual bowel wall thickening within the proximal descending colon (series 3, image 31), improved from prior. No additional sites of focal bowel wall thickening. Vascular/Lymphatic: Aortic atherosclerosis. No enlarged abdominal or pelvic lymph nodes. Reproductive: Status post hysterectomy. No adnexal masses. Other: No free fluid. No abdominopelvic fluid collection. No pneumoperitoneum. Tiny fat containing umbilical hernia. Musculoskeletal: No acute or significant osseous findings. Degenerative disc disease of L4-5. IMPRESSION: 1. Mild residual bowel wall thickening within the proximal descending colon, improved from prior study. 2. Moderate-large volume of stool throughout the colon. 3. Tiny bubble of air within the urinary bladder. Correlate for recent instrumentation. 4. Nonobstructing right renal stone. 5. Aortic atherosclerosis (ICD10-I70.0). Electronically Signed   By: Davina Poke D.O.   On: 03/15/2022 18:57  ? ?DG Chest Port 1 View ? ?Result Date: 03/15/2022 ?CLINICAL DATA:  Sepsis EXAM: PORTABLE CHEST 1 VIEW COMPARISON:  Chest x-ray dated July 25, 2019 FINDINGS: Cardiac and mediastinal contours are unchanged. Lungs are clear. No pleural effusion pneumothorax. IMPRESSION: No active disease. Electronically Signed   By: Yetta Glassman M.D.   On: 03/15/2022 19:00     ? ?Shary Key, DO ?03/16/2022, 7:44 AM ?PGY-2, [place] ?[place] Intern pager: [phone], text pages welcome  ?

## 2022-03-16 NOTE — Progress Notes (Signed)
New Admission Note:  ? ?Arrival Method: from ED via stretcher ?Mental Orientation: alert and oriented x4 ?Telemetry: 5M18, CCMD notified ?Assessment: completed ?Skin: Intact ?IV: Lwrist and LFA infusing LR @ 100cc/hr ?Pain: 3/10, given tylenol 1000 mg at 0144 ?Tubes: None ?Safety Measures: Safety Fall Prevention Plan has been discussed  ?Admission: completed ?[facility] [place] Orientation: Patient has been oriented to the room, unit and staff.   ?Family: none at bedside ? ?Orders to be reviewed and implemented. Will continue to monitor the patient. Call light has been placed within reach and bed alarm has been activated.  ? ?

## 2022-03-16 NOTE — Progress Notes (Signed)
FPTS Brief Progress Note ? ?S:Sleeping ? ? ?O: ?BP (!) 116/41 (BP Location: Right Arm)   Pulse 90   Temp 98.8 ?F (37.1 ?C) (Oral)   Resp 19   Ht '5\' 2"'$  (1.575 m)   Wt 72.5 kg   SpO2 99%   BMI 29.23 kg/m?   ? ? ?A/P: ?- Bladder scans: none charted so far, will follow after next urine output ?- Orders reviewed. Labs for AM ordered, which was adjusted as needed.  ?- See H&P for full plan details. ? ?Gladys Damme, MD ?03/16/2022, 2:56 AM ?PGY-3, [place] Medicine Night Resident  ?Please page [phone] with questions.  ? ? ?

## 2022-03-17 DIAGNOSIS — N179 Acute kidney failure, unspecified: Secondary | ICD-10-CM | POA: Diagnosis not present

## 2022-03-17 DIAGNOSIS — N183 Chronic kidney disease, stage 3 unspecified: Secondary | ICD-10-CM | POA: Diagnosis not present

## 2022-03-17 DIAGNOSIS — K5900 Constipation, unspecified: Secondary | ICD-10-CM | POA: Diagnosis not present

## 2022-03-17 LAB — CBC
HCT: 30.2 % — ABNORMAL LOW (ref 36.0–46.0)
Hemoglobin: 9.3 g/dL — ABNORMAL LOW (ref 12.0–15.0)
MCH: 30.3 pg (ref 26.0–34.0)
MCHC: 30.8 g/dL (ref 30.0–36.0)
MCV: 98.4 fL (ref 80.0–100.0)
Platelets: 464 10*3/uL — ABNORMAL HIGH (ref 150–400)
RBC: 3.07 MIL/uL — ABNORMAL LOW (ref 3.87–5.11)
RDW: 13.2 % (ref 11.5–15.5)
WBC: 12.7 10*3/uL — ABNORMAL HIGH (ref 4.0–10.5)
nRBC: 0 % (ref 0.0–0.2)

## 2022-03-17 LAB — URINE CULTURE: Culture: NO GROWTH

## 2022-03-17 LAB — BASIC METABOLIC PANEL
Anion gap: 4 — ABNORMAL LOW (ref 5–15)
BUN: 36 mg/dL — ABNORMAL HIGH (ref 8–23)
CO2: 21 mmol/L — ABNORMAL LOW (ref 22–32)
Calcium: 8 mg/dL — ABNORMAL LOW (ref 8.9–10.3)
Chloride: 115 mmol/L — ABNORMAL HIGH (ref 98–111)
Creatinine, Ser: 1.62 mg/dL — ABNORMAL HIGH (ref 0.44–1.00)
GFR, Estimated: 33 mL/min — ABNORMAL LOW (ref >60)
Glucose, Bld: 105 mg/dL — ABNORMAL HIGH (ref 70–99)
Potassium: 4.9 mmol/L (ref 3.5–5.1)
Sodium: 140 mmol/L (ref 135–145)

## 2022-03-17 LAB — GLUCOSE, CAPILLARY
Glucose-Capillary: 89 mg/dL (ref 70–99)
Glucose-Capillary: 125 mg/dL — ABNORMAL HIGH (ref 70–99)
Glucose-Capillary: 111 mg/dL — ABNORMAL HIGH (ref 70–99)
Glucose-Capillary: 156 mg/dL — ABNORMAL HIGH (ref 70–99)

## 2022-03-17 LAB — HEMOGLOBIN A1C
Hgb A1c MFr Bld: 7.8 % — ABNORMAL HIGH (ref 4.8–5.6)
Mean Plasma Glucose: 177.16 mg/dL

## 2022-03-17 MED ORDER — POLYETHYLENE GLYCOL 3350 17 G PO PACK
17.0000 g | PACK | Freq: Three times a day (TID) | ORAL | Status: DC
  Administered 2022-03-17 – 2022-03-18 (×2): 17 g via ORAL
  Filled 2022-03-17 (×2): qty 1

## 2022-03-17 MED ORDER — SENNOSIDES-DOCUSATE SODIUM 8.6-50 MG PO TABS
2.0000 | ORAL_TABLET | Freq: Two times a day (BID) | ORAL | Status: DC
  Administered 2022-03-17 – 2022-03-18 (×3): 2 via ORAL
  Filled 2022-03-17 (×3): qty 2

## 2022-03-17 NOTE — Progress Notes (Signed)
Patient ambulated in the hallway with assist,from her room down the hall near the stairwell exit then to the nurse's station and back to her room. Patient tolerated it well. No SOB noted. ?

## 2022-03-17 NOTE — Plan of Care (Signed)
?  Problem: Activity: ?Goal: Capacity to carry out activities will improve ?Outcome: Progressing ?  ?

## 2022-03-17 NOTE — Evaluation (Signed)
Physical Therapy Evaluation ?Patient Details ?Name: Pam Gamble ?MRN: [id number] ?DOB: [birth year] ?Today's Date: 03/17/2022 ? ?History of Present Illness ? Pam Gamble is a 76 y.o. female presenting with worsening kidney function, bilateral flank pain, abdominal pain, and nausea. PMH is significant for COPD, DM, MDD, GERD, Barrett's Esophagus, renal stones, uterine adenocarcinoma s/p partial hysterectomy.  ?Clinical Impression ?  ?Pt admitted with above diagnosis. Lives at home alone, in a single-level home with afew steps to enter; Prior to admission, pt was able to manage independnetly, walk without assistive device; uses [employer] for rides to appointments; Presents to PT with stiff gait and unsteadiness with amb;  Walked the hallways with minguard assist, stiff, slightly uncooordinated gait; Adjusted height of RW in her room, and recommend she continue to walk in her room and hallways with RW; Pt currently with functional limitations due to the deficits listed below (see PT Problem List). Pt will benefit from skilled PT to increase their independence and safety with mobility to allow discharge to the venue listed below.      ?   ? ?Recommendations for follow up therapy are one component of a multi-disciplinary discharge planning process, led by the attending physician.  Recommendations may be updated based on patient status, additional functional criteria and insurance authorization. ? ?Follow Up Recommendations Outpatient PT ? ?  ?Assistance Recommended at Discharge PRN  ?Patient can return home with the following ?   ? ?  ?Equipment Recommendations Rollator (4 wheels) (and shower seat)  ?Recommendations for Other Services ?    ?  ?Functional Status Assessment Patient has had a recent decline in their functional status and demonstrates the ability to make significant improvements in function in a reasonable and predictable amount of time.  ? ?  ?Precautions / Restrictions  Precautions ?Precautions: Other (comment) ?Precaution Comments: Fall risk present, but minimal  ? ?  ? ?Mobility ? Bed Mobility ?  ?  ?  ?  ?  ?  ?  ?  ?  ? ?Transfers ?Overall transfer level: Modified independent ?Equipment used: Rolling walker (2 wheels) ?  ?  ?  ?  ?  ?  ?  ?General transfer comment: Slow, but good rise; good hand placement, too ?  ? ?Ambulation/Gait ?Ambulation/Gait assistance: Min guard, Supervision ?Gait Distance (Feet): 200 Feet ?Assistive device: None, Straight cane, Rolling walker (2 wheels) ?Gait Pattern/deviations: Step-through pattern ?Gait velocity: slow ?  ?  ?General Gait Details: [place]erall slightly stiff gait with decr trunk motion; tending to reach out for UE support; walked without an assistive device initially, stiff gait, but no overt loss of balance; tried single point cane, which threw her gait patern off; better, smoother, and more confident steps with RW ? ?Stairs ?  ?  ?  ?  ?  ? ?Wheelchair Mobility ?  ? ?Modified Rankin (Stroke Patients Only) ?  ? ?  ? ?Balance Overall balance assessment: Needs assistance ?  ?  ?  ?  ?  ?  ?Standing balance comment: Tends to reach out for UE support when walking without RW ?  ?  ?  ?  ?  ?  ?  ?  ?  ?  ?  ?   ? ? ? ?Pertinent Vitals/Pain Pain Assessment ?Pain Assessment: No/denies pain  ? ? ?Home Living Family/patient expects to be discharged to:: Private residence ?Living Arrangements: Alone ?Available Help at Discharge: Personal care attendant (pt describes serivce through an organization called [employer]; they mostly help  with driving to appointments) ?Type of Home: Mobile home ?Home Access: Stairs to enter ?  ?Entrance Stairs-Number of Steps: 3 (back porch) ?  ?Home Layout: One level ?Home Equipment: Conservation officer, nature (2 wheels);Cane - single point (Were her mother's) ?Additional Comments: Pt is interested in getting a shower seat  ?  ?Prior Function Prior Level of Function : Independent/Modified Independent ?  ?  ?  ?  ?  ?  ?Mobility  Comments: Typically walks without assistive device; gets help for driving ?  ?  ? ? ?Hand Dominance  ?   ? ?  ?Extremity/Trunk Assessment  ? Upper Extremity Assessment ?Upper Extremity Assessment: Overall WFL for tasks assessed (for simple tasks) ?  ? ?Lower Extremity Assessment ?Lower Extremity Assessment: Generalized weakness (slightly decr coordination) ?  ? ?Cervical / Trunk Assessment ?Cervical / Trunk Assessment: Kyphotic  ?Communication  ? Communication: No difficulties  ?Cognition Arousal/Alertness: Awake/alert ?Behavior During Therapy: [facility] for tasks assessed/performed ?Overall Cognitive Status: Within Functional Limits for tasks assessed ?  ?  ?  ?  ?  ?  ?  ?  ?  ?  ?  ?  ?  ?  ?  ?  ?  ?  ?  ? ?  ?General Comments General comments (skin integrity, edema, etc.): Discussed HHPT versus Outpt PT, and ultimately, Outpt PT will meet her needs best; She has been to Outpt PT before with good results ? ?  ?Exercises    ? ?Assessment/Plan  ?  ?PT Assessment Patient needs continued PT services  ?PT Problem List Decreased strength;Decreased activity tolerance;Decreased balance;Decreased coordination ? ?   ?  ?PT Treatment Interventions DME instruction;Gait training;Stair training;Functional mobility training;Therapeutic activities;Therapeutic exercise;Balance training;Patient/family education   ? ?PT Goals (Current goals can be found in the Care Plan section)  ?Acute Rehab PT Goals ?Patient Stated Goal: Home soon ?PT Goal Formulation: With patient ?Time For Goal Achievement: 03/31/22 ?Potential to Achieve Goals: Good ? ?  ?Frequency Min 3X/week ?  ? ? ?Co-evaluation   ?  ?  ?  ?  ? ? ?  ?AM-PAC PT "6 Clicks" Mobility  ?Outcome Measure Help needed turning from your back to your side while in a flat bed without using bedrails?: None ?Help needed moving from lying on your back to sitting on the side of a flat bed without using bedrails?: None ?Help needed moving to and from a bed to a chair (including a wheelchair)?:  None ?Help needed standing up from a chair using your arms (e.g., wheelchair or bedside chair)?: None ?Help needed to walk in hospital room?: None ?Help needed climbing 3-5 steps with a railing? : A Little ?6 Click Score: 23 ? ?  ?End of Session   ?Activity Tolerance: Patient tolerated treatment well ?Patient left: in chair;with call bell/phone within reach ?Nurse Communication: Mobility status ?PT Visit Diagnosis: Unsteadiness on feet (R26.81) ?  ? ?Time: 1448-1856 ?PT Time Calculation (min) (ACUTE ONLY): 21 min ? ? ?Charges:   PT Evaluation ?$PT Eval Low Complexity: 1 Low ?  ?  ?   ? ? ?Roney Marion, PT  ?Acute Rehabilitation Services ?Office [phone] ? ? ?[place]ta [place] ?03/17/2022, 1:32 PM ? ?

## 2022-03-17 NOTE — Discharge Instructions (Addendum)
Dear [name], ? ?Thank you for letting us participate in your care. You were hospitalized for  Acute on chronic renal failure ([facility]).  ? ?POST-HOSPITAL & CARE INSTRUCTIONS ?Make hospital follow-up appointment with your primary care doctor.  This to be within about 1 week of discharge from the hospital. ?You may take MiraLAX up to 3 times daily until you start having soft regular bowel movements. ?The hospital physical therapist recommends that you go to outpatient physical therapy.  If this is not set up for you discharge, your PCP can refer you for outpatient physical therapy. ?Do NOT take your home lisinopril when you go home. You did not need it while hospitalized. This can make your blood pressure drop too low at home. Your doctor will tell you if you can start re-taking this.  ?Do NOT take your home torsemide when you go home. This can stress your kidneys. Your doctor will tell you if you can start re-taking this.  ?Do NOT take your home sodium bicarbonate when you go home. Your doctor will tell you if you can start re-taking this.  ?Given your acute renal failure, we recommend you avoid NSAIDs even though your renal function is now better.  This class of medicine includes ibuprofen, meloxicam, Aleve, and Toradol. ?Go to your follow up appointments (listed below) ? ?DOCTOR'S APPOINTMENT   ?No future appointments. ? Follow-up Information   ? ? Serita Grammes, MD. Schedule an appointment as soon as possible for a visit in 1 week(s).   ?Specialty: Family Medicine ?Why: Make a hospital follow-up with your primary care doctor.  This should be within about 1 week after discharge from hospital. ?Contact information: ?[address] ?[place] [address] ?[phone] ? ? ?  ?  ? ?  ?  ? ?  ? ? ?Take care and be well! ? ?Family Medicine Teaching Service Inpatient Team ?[place]  ?[facility]  ?[address] [place], [place] [address] ?[phone] ?

## 2022-03-17 NOTE — Progress Notes (Signed)
Family Medicine Teaching Service ?Daily Progress Note ?Intern Pager: [phone] ? ?Patient name: Devany Aja Medical record number: [id number] ?Date of birth: May 18, 1946 Age: 76 y.o. Gender: female ? ?Primary Care Provider: Serita Grammes, MD ?Consultants: None ?Code Status: DNR ? ?Pt Overview and Major Events to Date:  ?5/5: ADMITTED with abdominal and flank pain, acute renal failure  ?5/6: MIVFs, creatinine improved ? ?Assessment and Plan: ? ?Jerilynn Feldmeier is a 76 year old female who presents to with acute renal failure, now stable.  Past medical history significant for COPD, diabetes, major depression disorder, GERD, Barrett's esophagus, renal stones, uterine adenocarcinoma status post partial hysterectomy. ? ?Acute Renal failure, improved  ?Creatinine improved from 2.13 to 1.64, no longer on IVFs. Patient with 2180m urinary output since admission. Patient reports improved oral intake.  ?-continue to hold ACEi  ?- repeat bmp in AM  ?-monitor urine output  ?-will have patient evaluated by PT after recheck discharge from the hospital  ? ?Colitis  constipation  ?Improved. Decreased WBC from 15 to 12. Still no bowel movement  ?- continue miralax and senna  ?- increase to twice daily dosing  ?- PRN tylenol  ?- will add suppository followed by enema if unable to pass stool on twice daily regimen  ? ?Pyelonephritis  ?No longer on abx therapy. AF overnight. No urine cultures as of yet. Blood cultures negative to date. WBC improved from 15 to 12.  ?- monitor for development of fever  ?- f/u urine culture, collected  ? ?HFpEF  ?- continue to monitor I/Os  ?- daily weight  ?- holding home torsemide in setting of acute renal failure  ?- continue ASA '81mg'$  daily, home med  ? ?COPD  ?Stable on RA ?- continue home trelegy  ?- continue home singulair  ? ?T2DM  ?CBG max 140 overnight. Last A1c 7.8. Patient consuming 75-100% of last two meals recorded.  ?- continue semglee 20 units daily  ?- sensitive sliding scale  insulin  ?- continue glucose monitoring four times daily  ? ?HLD  ?Continue crestor '20mg'$  daily  ? ?Allergic Rhinitis  ?- continue home claritin ? ? ?FEN/GI: carb modified  ?PPx: lovenox  ?Dispo:Pending PT recommendations  tomorrow. Barriers include PT evaluation.  ? ?Subjective:  ?Patient reports decreased nausea, feels better but does have some back pain from lying in hospital bed. States she normally sleeps in her recliner at home 2/2 to COPD. Patient reports that she has been urinating frequently since receiving IV fluids. Her abdominal pain is improved. She states she has not had a bowel movement yet.  ? ?Objective: ?Temp:  [97.5 ?F (36.4 ?C)-98.5 ?F (36.9 ?C)] 98.5 ?F (36.9 ?C) (05/07 0421) ?Pulse Rate:  [61-72] 70 (05/07 0421) ?Resp:  [16-19] 17 (05/07 0421) ?BP: (96-117)/(41-52) 117/44 (05/07 0421) ?SpO2:  [97 %-100 %] 98 % (05/07 0421) ? ?Physical Exam: ?General: elderly appearing female sitting in bedside chair  ?Cardiovascular: RRR  ?Respiratory: occasional wheezing otherwise normal WOB on RA  ?Abdomen: soft, non distended non tenderness  ?Extremities: no LE pitting edema  ? ?Laboratory: ?Recent Labs  ?Lab 03/15/22 ?1548 03/17/22 ?0255  ?WBC 15.3* 12.7*  ?HGB 10.8* 9.3*  ?HCT 36.2 30.2*  ?PLT 448* 464*  ? ?Recent Labs  ?Lab 03/15/22 ?1548 03/16/22 ?1024 03/17/22 ?0255  ?NA 135 137 140  ?K 4.0 4.5 4.9  ?CL 101 108 115*  ?CO2 20* 22 21*  ?BUN 71* 47* 36*  ?CREATININE 3.79* 2.13* 1.62*  ?CALCIUM 8.2* 7.9* 8.0*  ?PROT 6.4*  --   --   ?  BILITOT 0.2*  --   --   ?ALKPHOS 53  --   --   ?ALT 19  --   --   ?AST 34  --   --   ?GLUCOSE 225* 168* 105*  ? ? ?Imaging/Diagnostic Tests: ?No new imaging  ? ?Eulis Foster, MD ?03/17/2022, 6:57 AM ?PGY-3, [place] [facility] ?[place] Intern pager: [phone], text pages welcome ? ?

## 2022-03-17 NOTE — Progress Notes (Signed)
FPTS Brief Progress Note ? ?S:Patient sleeping at time of visit.  ? ? ?O: ?BP (!) 115/43 (BP Location: Right Arm)   Pulse 71   Temp 98.2 ?F (36.8 ?C) (Oral)   Resp 16   Ht '5\' 2"'$  (1.575 m)   Wt 72.5 kg   SpO2 100%   BMI 29.23 kg/m?   ?Gen: elderly female lying supine in bed ?Pulm: no signs of respiratory distress, visible chest rise  ? ?A/P: ?- Orders reviewed. Labs for AM ordered, which was adjusted as needed.  ?.  ? ?Eulis Foster, MD ?03/17/2022, 4:04 AM ?PGY-3, [place] [facility] Night Resident  ?Please page [phone] with questions.  ? ? ?

## 2022-03-17 NOTE — Progress Notes (Signed)
Pt refused soap suds enema. States she told dr that she cant do enema. Text page went out to on call pager to let them know of pt refusal.  ?

## 2022-03-18 ENCOUNTER — Other Ambulatory Visit (HOSPITAL_COMMUNITY): Payer: Self-pay

## 2022-03-18 DIAGNOSIS — N183 Chronic kidney disease, stage 3 unspecified: Secondary | ICD-10-CM | POA: Diagnosis not present

## 2022-03-18 DIAGNOSIS — E875 Hyperkalemia: Secondary | ICD-10-CM

## 2022-03-18 DIAGNOSIS — N179 Acute kidney failure, unspecified: Secondary | ICD-10-CM | POA: Diagnosis not present

## 2022-03-18 LAB — CBC
HCT: 31 % — ABNORMAL LOW (ref 36.0–46.0)
Hemoglobin: 9.3 g/dL — ABNORMAL LOW (ref 12.0–15.0)
MCH: 29.7 pg (ref 26.0–34.0)
MCHC: 30 g/dL (ref 30.0–36.0)
MCV: 99 fL (ref 80.0–100.0)
Platelets: 471 10*3/uL — ABNORMAL HIGH (ref 150–400)
RBC: 3.13 MIL/uL — ABNORMAL LOW (ref 3.87–5.11)
RDW: 13.3 % (ref 11.5–15.5)
WBC: 12.8 10*3/uL — ABNORMAL HIGH (ref 4.0–10.5)
nRBC: 0 % (ref 0.0–0.2)

## 2022-03-18 LAB — BASIC METABOLIC PANEL
Anion gap: 4 — ABNORMAL LOW (ref 5–15)
BUN: 24 mg/dL — ABNORMAL HIGH (ref 8–23)
CO2: 23 mmol/L (ref 22–32)
Calcium: 8.3 mg/dL — ABNORMAL LOW (ref 8.9–10.3)
Chloride: 113 mmol/L — ABNORMAL HIGH (ref 98–111)
Creatinine, Ser: 1.32 mg/dL — ABNORMAL HIGH (ref 0.44–1.00)
GFR, Estimated: 42 mL/min — ABNORMAL LOW (ref >60)
Glucose, Bld: 94 mg/dL (ref 70–99)
Potassium: 5.4 mmol/L — ABNORMAL HIGH (ref 3.5–5.1)
Sodium: 140 mmol/L (ref 135–145)

## 2022-03-18 LAB — POTASSIUM: Potassium: 5.5 mmol/L — ABNORMAL HIGH (ref 3.5–5.1)

## 2022-03-18 LAB — GLUCOSE, CAPILLARY
Glucose-Capillary: 98 mg/dL (ref 70–99)
Glucose-Capillary: 180 mg/dL — ABNORMAL HIGH (ref 70–99)

## 2022-03-18 MED ORDER — SIMETHICONE 80 MG PO CHEW
80.0000 mg | CHEWABLE_TABLET | Freq: Four times a day (QID) | ORAL | 0 refills | Status: DC | PRN
  Filled 2022-03-18: qty 30, 8d supply, fill #0

## 2022-03-18 MED ORDER — SENNOSIDES-DOCUSATE SODIUM 8.6-50 MG PO TABS
2.0000 | ORAL_TABLET | Freq: Two times a day (BID) | ORAL | 0 refills | Status: AC
Start: 2022-03-18 — End: 2022-04-17
  Filled 2022-03-18: qty 120, 30d supply, fill #0

## 2022-03-18 MED ORDER — SODIUM ZIRCONIUM CYCLOSILICATE 10 G PO PACK
10.0000 g | PACK | Freq: Once | ORAL | Status: AC
  Administered 2022-03-18: 10 g via ORAL
  Filled 2022-03-18: qty 1

## 2022-03-18 MED ORDER — ENOXAPARIN SODIUM 40 MG/0.4ML IJ SOSY: 40.0000 mg | PREFILLED_SYRINGE | Freq: Every day | INTRAMUSCULAR | Status: DC

## 2022-03-18 MED ORDER — POLYETHYLENE GLYCOL 3350 17 GM/SCOOP PO POWD
17.0000 g | Freq: Two times a day (BID) | ORAL | 0 refills | Status: AC
Start: 2022-03-18 — End: 2022-04-17
  Filled 2022-03-18: qty 238, 7d supply, fill #0

## 2022-03-18 NOTE — Progress Notes (Signed)
She denies any concerns today. No acute findings on her physical exam. ?Her kidney function improved. ?Nephrotoxic agents held. ?She wants to go home today. ?I discussed elevated K+ levels with her. ?Lokelma was given, and the plan is to recheck later today.  ?I called her after her initial evaluation this morning to discuss the management plan for her mild hyperkalemia. ?A recheck was recommended today, but she declined. ?She prefers to be discharged and recheck with her PCP in 1-2 days. ?Low potassium diet discussed. ?F/U as needed ?

## 2022-03-18 NOTE — TOC Transition Note (Signed)
Transition of Care (TOC) - CM/SW Discharge Note ? ? ?Patient Details  ?Name: Pam Gamble ?MRN: [id number] ?Date of Birth: 10/02/1946 ? ?Transition of Care (TOC) CM/SW Contact:  ?Tom-Johnson, Renea Ee, RN ?Phone Number: ?03/18/2022, 1:01 PM ? ? ?Clinical Narrative:    ? ?Patient is scheduled for discharge today. Admitted for Acute on Chronic Renal Failure. CM spoke with patient at bedside about needs for post hospital transition.  ?From home alone. Does not have children and no siblings. Has a niece and great nephew in [place] and [place] in [place].  ?Independent with care and drive self prior to admission. Has a walker at home.  ?PT recommended Outpatient PT and patient chose [facility] on [employer] from list given from [email or web address]. Info on AVS.  ?Rollator and tub bench ordered from [facility] to deliver at bedside.  ?PCP is Serita Grammes, MD and uses [facility] in [place]. ?Annice Needy to transport at discharge. No further TOC needs noted. ? ?Final next level of care: OP Rehab ?Barriers to Discharge: Barriers Resolved ? ? ?Patient Goals and CMS Choice ?Patient states their goals for this hospitalization and ongoing recovery are:: To return home ?CMS Medicare.gov Compare Post Acute Care list provided to:: Patient ?Choice offered to / list presented to : Patient ? ?Discharge Placement ?  ?           ?  ?Patient to be transferred to facility by: Kerby Moors, friend. ?  ?  ? ?Discharge Plan and Services ?  ?  ?           ?DME Arranged: Walker rolling with seat, Tub bench ?DME Agency: [employer] ?Date DME Agency Contacted: 03/18/22 ?Time DME Agency Contacted: [phone] ?  ?HH Arranged: NA ?[place] Agency: NA ?  ?  ?  ? ?Social Determinants of Health (SDOH) Interventions ?  ? ? ?Readmission Risk Interventions ?   ? View : No data to display.  ?  ?  ?  ? ? ? ? ? ?

## 2022-03-18 NOTE — Progress Notes (Signed)
FPTS Brief Progress Note ? ?S: sleeping soundly ? ? ?O: ?BP (!) 114/45 (BP Location: Right Arm)   Pulse 74   Temp 99.1 ?F (37.3 ?C)   Resp 18   Ht '5\' 2"'$  (1.575 m)   Wt 72.5 kg   SpO2 99%   BMI 29.23 kg/m?   ? ? ?A/P: ?- Orders reviewed. Labs for AM ordered, which was adjusted as needed.  ?- Improved renal function, see daily progress note for plan details.  ? ?Gladys Damme, MD ?03/18/2022, 3:16 AM ?PGY-3, [place] [facility] Night Resident  ?Please page [phone] with questions.  ? ? ?

## 2022-03-18 NOTE — Hospital Course (Addendum)
Pam Gamble is a 76 y.o. female presenting with worsening kidney function, bilateral flank pain, abdominal pain, and nausea. PMH is significant for COPD, DM, MDD, GERD, Barrett's Esophagus, renal stones, uterine adenocarcinoma s/p partial hysterectomy.  ?  ?Sepsis R/u  Concern for UTI/pyelonephritis  Resolving Colitis ?Patient admitted for abdominal pain, flank pain, and worsening renal function. In the ED patient afebrile with BP 72/47 pulse 91 respiratory rate 15 O2 sat 96%.  Labs significant for Cr 3.79, BUN 71, glucose 225, lipase 55, GFR 12, WBC 15.3, Hgb 10.8, with UA showing trace leukocytes and rare bacteria. She had a negative CXR, with CT abdomen significant for mild residual bowel wall thickening in proximal descending colon, improved from prior study, moderate to large volume stool throughout colon, tiny bubble of air in urinary bladder, nonobstructing renal stone.  With her blood pressure and white blood cell count she met sepsis criteria, received 2.5 L of IV fluids, and was given one dose of metronidazole and ceftriaxone. Patient rapidly improved and it was determined that patient was not septic. Abx were stopped a day later. Patient WBC continue to improve and had trended down by discharge.   ? ?Acute renal failure  ?Patient admitted for worsening renal function/acute renal failure, likely secondary to dehydration (given history of poor hydration), or some intrinsic renal pathology, less likely to be obstructive post renal issue as patient has urinated (less frequently than before), and CT abdomen pelvis showed no concerns for obstruction.  On review of patient's charts on her cell phone, she recently had serum creatinine ranging from 1-1.4. Patient was treated with gentle hydratation and had an improvement in her renal function. Etiology of ARF was likely pre-renal in setting of dehydration. Cr had imrpoved and was trending down on d/c  ? ?Colitis ?Patient with recent hospitalization, last  week, at [facility] for colitis. She was discharged on Levaquin 500 mg daily and Flagyl 500 mg daily. Unsure how long she was supposed to continue abx course, however CT abdomen pelvis demonstrating mild residual bowel wall thickening within the proximal descending colon. Patient notes from [facility], demonstrated a WBC was 17 on discharged, now 15 on admission. Trend suggesting improvement in colitis. Patient has also been on levo and flagyl since discharge from [facility]. By discharge, patient was off antibiotics, with improved symptoms and was having small bowel movements.  ? ?Hyperkalemia ?Patient had hyperkalemia to 5.5 on day of discharge. She received a dose of lokelma and discharged home with close PCP follow up to be had the following day.  ? ? ?HFpEF ?Patient with history of HFpEF, last echo 02/08/2021 with EF 60 to 65%, and grade 2 diastolic dysfunction. Patient taking Torsemide 10 mg daily, and Lisinopril 10 mg at home. Patient volume status euvolemic, with stable respiratory status, and did not appear to be in HF exacerbation during hospitalization. ? ?COPD ?Patient with history of COPD per chart review, on trelegy Ellipta, albuterol.  Not on oxygen, O2 sats appropriate, she did not appear to be in COPD exacerbation during hospitalization. ? ?All other conditions remained chronic and stable  ? ?PCP Follow-up items: ?Lisinopril held throughout admission and discharge for blood pressures in 110s/30s-40s. Reassess need for resumption of medicine at follow-up. ?Torsemide held at discharge. Patient is euvolemic and had AKI. Can resume as appropriate pending re-check of kidney function with labs ?Potassium elevated on discharge at 5.5, received one dose of Lokelma 40m. PCP to check potassium at follow up within 1 week of discharge  ? ?

## 2022-03-18 NOTE — Discharge Summary (Signed)
Family Medicine Teaching Service ?Hospital Discharge Summary ? ?Patient name: Pam Gamble Medical record number: [id number] ?Date of birth: 12-02-1945 Age: 76 y.o. Gender: female ?Date of Admission: 03/15/2022  Date of Discharge: 03/18/22 ?Admitting Physician: Holley Bouche, MD ? ?Primary Care Provider: Serita Grammes, MD ?Consultants:  ? ?Indication for Hospitalization: Acute renal failure  ? ?Discharge Diagnoses/Problem List:  ?T2DM  ?Pyelonephritis ?Colitis ?HFpEF ?COPD ?T2DM ?HLD ?Allergic Rhinitis  ? ?Disposition: home  ? ?Discharge Condition: medically stable for discharge  ? ?Discharge Exam:  ?Temp:  [98.1 ?F (36.7 ?C)-99.1 ?F (37.3 ?C)] 99 ?F (37.2 ?C) (05/08 0526) ?Pulse Rate:  [66-92] 71 (05/08 0526) ?Resp:  [17-18] 18 (05/08 0526) ?BP: (106-119)/(37-45) 119/45 (05/08 0526) ?SpO2:  [95 %-99 %] 97 % (05/08 0526) ?Physical Exam: ?General: Well appearing, polite, conversant, NAD, [place] ?Cardiovascular: RRR, NRMG ?Respiratory: CTABL, no crackles or wheezes ?Abdomen: Soft, mild ttp in lower left quadrant/groin, non-distended ?Extremities: No edema, pulses intact in all extremities ? ?Brief Hospital Course:  ?Pam Gamble is a 76 y.o. female presenting with worsening kidney function, bilateral flank pain, abdominal pain, and nausea. PMH is significant for COPD, DM, MDD, GERD, Barrett's Esophagus, renal stones, uterine adenocarcinoma s/p partial hysterectomy.  ?  ?Sepsis R/u  Concern for UTI/pyelonephritis  Resolving Colitis ?Patient admitted for abdominal pain, flank pain, and worsening renal function. In the ED patient afebrile with BP 72/47 pulse 91 respiratory rate 15 O2 sat 96%.  Labs significant for Cr 3.79, BUN 71, glucose 225, lipase 55, GFR 12, WBC 15.3, Hgb 10.8, with UA showing trace leukocytes and rare bacteria. She had a negative CXR, with CT abdomen significant for mild residual bowel wall thickening in proximal descending colon, improved from prior study, moderate to large volume  stool throughout colon, tiny bubble of air in urinary bladder, nonobstructing renal stone.  With her blood pressure and white blood cell count she met sepsis criteria, received 2.5 L of IV fluids, and was given one dose of metronidazole and ceftriaxone. Patient rapidly improved and it was determined that patient was not septic. Abx were stopped a day later. Patient WBC continue to improve and had trended down by discharge.   ? ?Acute renal failure  ?Patient admitted for worsening renal function/acute renal failure, likely secondary to dehydration (given history of poor hydration), or some intrinsic renal pathology, less likely to be obstructive post renal issue as patient has urinated (less frequently than before), and CT abdomen pelvis showed no concerns for obstruction.  On review of patient's charts on her cell phone, she recently had serum creatinine ranging from 1-1.4. Patient was treated with gentle hydratation and had an improvement in her renal function. Etiology of ARF was likely pre-renal in setting of dehydration. Cr had imrpoved and was trending down on d/c  ? ?Colitis ?Patient with recent hospitalization, last week, at [facility] for colitis. She was discharged on Levaquin 500 mg daily and Flagyl 500 mg daily. Unsure how long she was supposed to continue abx course, however CT abdomen pelvis demonstrating mild residual bowel wall thickening within the proximal descending colon. Patient notes from [facility], demonstrated a WBC was 17 on discharged, now 15 on admission. Trend suggesting improvement in colitis. Patient has also been on levo and flagyl since discharge from [facility]. By discharge, patient was off antibiotics, with improved symptoms and was having small bowel movements.  ? ?Hyperkalemia ?Patient had hyperkalemia to 5.5 on day of discharge. She received a dose of lokelma and discharged home with  close PCP follow up to be had the following day.  ? ? ?HFpEF ?Patient with history  of HFpEF, last echo 02/08/2021 with EF 60 to 65%, and grade 2 diastolic dysfunction. Patient taking Torsemide 10 mg daily, and Lisinopril 10 mg at home. Patient volume status euvolemic, with stable respiratory status, and did not appear to be in HF exacerbation during hospitalization. ? ?COPD ?Patient with history of COPD per chart review, on trelegy Ellipta, albuterol.  Not on oxygen, O2 sats appropriate, she did not appear to be in COPD exacerbation during hospitalization. ? ?All other conditions remained chronic and stable  ? ?PCP Follow-up items: ?Lisinopril held throughout admission and discharge for blood pressures in 110s/30s-40s. Reassess need for resumption of medicine at follow-up. ?Torsemide held at discharge. Patient is euvolemic and had AKI. Can resume as appropriate pending re-check of kidney function with labs ?Potassium elevated on discharge at 5.5, received one dose of Lokelma 9m. PCP to check potassium at follow up within 1 week of discharge  ? ? ?Significant Procedures:  ? ?Significant Labs and Imaging:  ?Recent Labs  ?Lab 03/15/22 ?1548 03/17/22 ?0255 03/18/22 ?0432  ?WBC 15.3* 12.7* 12.8*  ?HGB 10.8* 9.3* 9.3*  ?HCT 36.2 30.2* 31.0*  ?PLT 448* 464* 471*  ? ?Recent Labs  ?Lab 03/15/22 ?1548 03/16/22 ?1024 03/17/22 ?0255 03/18/22 ?0432 03/18/22 ?1132  ?NA 135 137 140 140  --   ?K 4.0 4.5 4.9 5.4* 5.5*  ?CL 101 108 115* 113*  --   ?CO2 20* 22 21* 23  --   ?GLUCOSE 225* 168* 105* 94  --   ?BUN 71* 47* 36* 24*  --   ?CREATININE 3.79* 2.13* 1.62* 1.32*  --   ?CALCIUM 8.2* 7.9* 8.0* 8.3*  --   ?ALKPHOS 53  --   --   --   --   ?AST 34  --   --   --   --   ?ALT 19  --   --   --   --   ?ALBUMIN 3.1*  --   --   --   --   ? ? ? ? ?Results/Tests Pending at Time of Discharge:  ? ?Discharge Medications:  ?Allergies as of 03/18/2022   ? ?   Reactions  ? Amoxicillin-pot Clavulanate Nausea Only, Other (See Comments)  ? REACTION: diarrhea ?"sick to stomach" ?Has patient had a PCN reaction causing immediate rash,  facial/tongue/throat swelling, SOB or lightheadedness with hypotension: no ?Has patient had a PCN reaction causing severe rash involving mucus membranes or skin necrosis: no ?Has patient had a PCN reaction that required hospitalization: no ?Has patient had a PCN reaction occurring within the last 10 years: no ?If all of the above answers are "NO", then may proceed with Cephalosporin use.  ? Levaquin [levofloxacin] Nausea Only  ? Ertugliflozin Anxiety  ? Steglatro -  - "it made me feel funny"  ? ?  ? ?  ?Medication List  ?  ? ?STOP taking these medications   ? ?levofloxacin 500 MG tablet ?Commonly known as: LEVAQUIN ?  ?lisinopril 10 MG tablet ?Commonly known as: ZESTRIL ?  ?metroNIDAZOLE 500 MG tablet ?Commonly known as: FLAGYL ?  ?sodium bicarbonate 650 MG tablet ?  ?torsemide 10 MG tablet ?Commonly known as: DEMADEX ?  ? ?  ? ?TAKE these medications   ? ?albuterol (2.5 MG/3ML) 0.083% nebulizer solution ?Commonly known as: PROVENTIL ?Take 2.5 mg by nebulization every 8 (eight) hours. ?  ?aspirin EC 81 MG tablet ?Take  81 mg by mouth daily. ?  ?Azelastine HCl 137 MCG/SPRAY Soln ?Place 2 sprays into both nostrils 2 (two) times daily. ?  ?B-12 PO ?Take 1 tablet by mouth daily. ?  ?Fluticasone-Umeclidin-Vilant 100-62.5-25 MCG/INH Aepb ?Commonly known as: Trelegy Ellipta ?Inhale 1 puff into the lungs daily. ?  ?HYDROcodone-acetaminophen 5-325 MG tablet ?Commonly known as: NORCO/VICODIN ?Take 1 tablet by mouth every 8 (eight) hours as needed for moderate pain. ?  ?levocetirizine 5 MG tablet ?Commonly known as: XYZAL ?Take 5 mg by mouth at bedtime. ?  ?montelukast 10 MG tablet ?Commonly known as: SINGULAIR ?Take 10 mg by mouth daily. ?  ?omeprazole 40 MG capsule ?Commonly known as: PRILOSEC ?Take 40 mg by mouth 2 (two) times daily. ?  ?ondansetron 4 MG disintegrating tablet ?Commonly known as: ZOFRAN-ODT ?Take 4 mg by mouth 3 (three) times daily. ?  ?Ozempic (1 MG/DOSE) 4 MG/3ML Sopn ?Generic drug: Semaglutide (1  MG/DOSE) ?Inject 1 mg into the skin once a week. ?  ?polyethylene glycol powder 17 GM/SCOOP powder ?Commonly known as: GLYCOLAX/MIRALAX ?Take 1 capful (17 g) with water by mouth 2 (two) times daily. ?  ?rOPINIRole

## 2022-03-18 NOTE — Progress Notes (Signed)
Janean Sark Loose to be discharged Home per MD order. Discussed prescriptions and follow up appointments with the patient. Prescriptions given to patient; medication list explained in detail. Patient verbalized understanding. ?Patient got her medications from the outpt pharmacy.   ? ?Skin clean, dry and intact without evidence of skin break down, no evidence of skin tears noted. IV catheter discontinued intact. Site without signs and symptoms of complications. Dressing and pressure applied. Pt denies pain at the site currently. No complaints noted. ? ?Patient free of lines, drains, and wounds.  ? ?An After Visit Summary (AVS) was printed and given to the patient. ?Patient escorted via wheelchair, and discharged home via private auto. ? ?Amaryllis Dyke, RN  ?

## 2022-03-18 NOTE — Progress Notes (Incomplete)
Family Medicine Teaching Service ?Daily Progress Note ?Intern Pager: [phone] ? ?Patient name: Pam Gamble Medical record number: [id number] ?Date of birth: [birth year] Age: 76 y.o. Gender: female ? ?Primary Care Provider: Serita Grammes, MD ?Consultants: None ?Code Status: DNR ? ?Pt Overview and Major Events to Date:  ?5/5: ADMITTED with abdominal and flank pain, acute renal failure  ?5/6: MIVFs, creatinine improved ? ?Assessment and Plan: ? ?Pam Gamble is a 76 year old female who presents to with acute renal failure, now stable.  Past medical history significant for COPD, diabetes, major depression disorder, GERD, Barrett's esophagus, renal stones, uterine adenocarcinoma status post partial hysterectomy. ?  ?Acute Renal failure, improved  ?Cr improved to 1.32 (1.62), UOP .900.. yesterday. Patient reports good oral intake. K 5.4  ?-Hold ACEi ?-AM BMP ?-Strict I/O ?-PT/OT ? ?Colitis  constipation  ?Improved, WBC (12.8 stable 12.7)  bowel movement *** ?-Continue miralx and senna BID ?-PRN Tylenol ? ?Pyelonephritis  ?Afebrile overnight, no Urine Cx, Blood Cx NGTD ***, WBC (), no longer on abx ? ? ?HFpEF  ?Volume statust... Wt.... ?-Strict I/O ?-Daily weights ?-Hold toresemide, in setting of acute renal failure ?-Continue asa 81 mg daily ? ?COPD ?Stable on RA, does not appear to be in exacerbation. ?-Continue trelergy ?-Continue singulair ? ?T2DM ?CBG range *** ?-Semglee 20 units daily ?-sSSI ?-CGM 4 x daily ? ?HLD ?-Crestor 20 mg daily ? ?Allergic Rhinits ?-Claritin ? ?FEN/GI: Carb Modified ?PPx: Lovenox ?Dispo:Home {FPTSDISPOTIME:25766}. Barriers include ***.  ? ?Subjective:  ?*** ? ?Objective: ?Temp:  [98.1 ?F (36.7 ?C)-99.1 ?F (37.3 ?C)] 99 ?F (37.2 ?C) (05/08 0526) ?Pulse Rate:  [66-92] 71 (05/08 0526) ?Resp:  [17-18] 18 (05/08 0526) ?BP: (106-119)/(37-45) 119/45 (05/08 0526) ?SpO2:  [95 %-99 %] 97 % (05/08 0526) ?Physical Exam: ?General: *** ?Cardiovascular: *** ?Respiratory: *** ?Abdomen:  *** ?Extremities: *** ? ?Laboratory: ?Recent Labs  ?Lab 03/15/22 ?1548 03/17/22 ?0255 03/18/22 ?0432  ?WBC 15.3* 12.7* 12.8*  ?HGB 10.8* 9.3* 9.3*  ?HCT 36.2 30.2* 31.0*  ?PLT 448* 464* 471*  ? ?Recent Labs  ?Lab 03/15/22 ?1548 03/16/22 ?1024 03/17/22 ?0255 03/18/22 ?0432  ?NA 135 137 140 PENDING  ?K 4.0 4.5 4.9 5.4*  ?CL 101 108 115* PENDING  ?CO2 20* 22 21* PENDING  ?BUN 71* 47* 36* 24*  ?CREATININE 3.79* 2.13* 1.62* 1.32*  ?CALCIUM 8.2* 7.9* 8.0* 8.3*  ?PROT 6.4*  --   --   --   ?BILITOT 0.2*  --   --   --   ?ALKPHOS 53  --   --   --   ?ALT 19  --   --   --   ?AST 34  --   --   --   ?GLUCOSE 225* 168* 105* 94  ? ? ?*** ? ?Imaging/Diagnostic Tests: ?*** ? Holley Bouche, MD ?03/18/2022, 6:18 AM ?PGY-***, [facility] ?[place] Intern pager: [phone], text pages welcome ? ?

## 2022-03-18 NOTE — Progress Notes (Signed)
Mobility Specialist: Progress Note ? ? 03/18/22 1123  ?Mobility  ?Activity Refused mobility  ? ?Pt refused mobility stating she just finished ambulating by herself with RW. No c/o. Will f/u as able.  ? ?Harrell Gave Mylinda Brook ?Mobility Specialist ?Mobility Specialist [place]: [phone] ?Mobility Specialist [place]: [phone] ? ?

## 2022-03-19 DIAGNOSIS — A419 Sepsis, unspecified organism: Secondary | ICD-10-CM | POA: Diagnosis not present

## 2022-03-19 DIAGNOSIS — Z6828 Body mass index (BMI) 28.0-28.9, adult: Secondary | ICD-10-CM | POA: Diagnosis not present

## 2022-03-19 DIAGNOSIS — Z794 Long term (current) use of insulin: Secondary | ICD-10-CM | POA: Diagnosis not present

## 2022-03-19 DIAGNOSIS — E119 Type 2 diabetes mellitus without complications: Secondary | ICD-10-CM | POA: Diagnosis not present

## 2022-03-19 DIAGNOSIS — N179 Acute kidney failure, unspecified: Secondary | ICD-10-CM | POA: Diagnosis not present

## 2022-03-19 DIAGNOSIS — R300 Dysuria: Secondary | ICD-10-CM | POA: Diagnosis not present

## 2022-03-19 DIAGNOSIS — K529 Noninfective gastroenteritis and colitis, unspecified: Secondary | ICD-10-CM | POA: Diagnosis not present

## 2022-03-20 LAB — CULTURE, BLOOD (ROUTINE X 2)
Culture: NO GROWTH
Culture: NO GROWTH
Special Requests: ADEQUATE
Special Requests: ADEQUATE

## 2022-03-21 ENCOUNTER — Other Ambulatory Visit: Payer: Self-pay | Admitting: Family Medicine

## 2022-03-22 ENCOUNTER — Ambulatory Visit: Payer: HMO | Attending: Family Medicine

## 2022-03-22 DIAGNOSIS — R2681 Unsteadiness on feet: Secondary | ICD-10-CM | POA: Insufficient documentation

## 2022-03-22 DIAGNOSIS — M6281 Muscle weakness (generalized): Secondary | ICD-10-CM | POA: Diagnosis not present

## 2022-03-22 DIAGNOSIS — R531 Weakness: Secondary | ICD-10-CM | POA: Insufficient documentation

## 2022-03-22 DIAGNOSIS — N179 Acute kidney failure, unspecified: Secondary | ICD-10-CM | POA: Insufficient documentation

## 2022-03-22 NOTE — Therapy (Signed)
?OUTPATIENT PHYSICAL THERAPY NEURO EVALUATION ? ? ?Patient Name: Pam Gamble ?MRN: [id number] ?DOB:01-Nov-1946, 76 y.o., female ?Today's Date: 03/22/2022 ? ?PCP: Serita Grammes, MD ?REFERRING PROVIDER: McDiarmid, Blane Ohara, MD ? ? PT End of Session - 03/22/22 1129   ? ? Visit Number 1   ? Number of Visits 4   ? Date for PT Re-Evaluation 04/19/22   ? Authorization Type healthteam advantage   ? PT Start Time 1050   ? PT Stop Time 1135   ? PT Time Calculation (min) 45 min   ? Activity Tolerance Patient tolerated treatment well   ? Behavior During Therapy [facility] for tasks assessed/performed   ? ?  ?  ? ?  ? ? ?Past Medical History:  ?Diagnosis Date  ? Abdominal pain, unspecified site 07/12/2014  ? 8/15 lower R>L ? etiol - r/o pyelo and others   ? Acute biliary pancreatitis 10/01/2018  ? Acute cholangitis 10/01/2018  ? Acute non-recurrent maxillary sinusitis 01/12/2011  ? Qualifier: Diagnosis of  By: Madilyn Fireman MD, Barnetta Chapel    ? Allergic rhinitis 01/08/2008  ? Chronic    ? Atrophic vaginitis 05/03/2015  ? 6/16   ? Barrett's esophagus   ? C O P D WITH ACUTE EXACERBATION 03/21/2010  ? 9/13, 11/14, 8/18  [name], Leanna Sato.  Potential benefits of a short/long term steroid  use as well as potential risks  and complications were explained to the patient and were aknowledged.     ? Calcium deposits in tendon and bursa   ? CAROTID BRUIT 02/09/2008  ? Qualifier: Diagnosis of  By: Plotnikov MD, Evie Lacks   ? Chest discomfort 06/22/2019  ? COPD (chronic obstructive pulmonary disease) ([place])   ? COPD, frequent exacerbations ([place]) 09/15/2007  ? Dr Vaughan Browner Chronic, severe Advair Albuterol HHN prn Steroids - frequent use  Pt sleeps in a recliner since 2012   ? Cough 05/23/2012  ? Depression   ? Diabetes mellitus due to underlying condition with unspecified complications ([place]) 41/66/0630  ? Diabetes type 2, uncontrolled 12/27/2013  ? 2/15 2/17 Levemir - too $$$. Started Toujeo (10/17) samples Continue with current prescription therapy as  reflected on the Med list - Levemir. Risks associated with treatment noncompliance were discussed. Compliance was encouraged. 2018 Metformin and NPH insulin 5/18 Use NPH insulin 40 u in am and 10 u at night. Titrate up by 1 unit a day each (AM and HS doses) for goal sugars of 100-130 up to 60   ? Dyspnea on exertion 06/22/2019  ? Edema 09/09/2014  ? 10/15 B LE   ? Esophageal reflux   ? Ex-smoker 06/22/2019  ? Fever, unspecified 07/12/2014  ? 8/15 mild T99F   ? HYSTERECTOMY, PARTIAL, HX OF 09/26/2007  ? Qualifier: Diagnosis of  By: [place], [place]    ? Idiopathic thrombocythemia ([place])   ? Kidney stone 07/27/2014  ? 2014 Dr Janice Norrie  ? Kidney stones   ? Lateral epicondylitis  of elbow   ? Leg cramp 04/15/2014  ?  Albuterol can give cramps   ? Low back pain 04/25/2017  ? Spinal stenosis  ? Major depressive disorder, recurrent episode ([place]) 06/14/2009  ? Chronic     ? Mixed dyslipidemia 03/28/2020  ? Noncompliance 09/10/2016  ? Recurrent   ? Pericardial effusion 06/22/2019  ? Personal history of malignant neoplasm of other parts of uterus   ? Personal history of other diseases of digestive system   ? 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 Remote  Levaquin Rx  Vaccines   ? Pyelonephritis, acute 02/26/2011  ? 2012, 2015 L   ? Rotator cuff arthropathy 03/21/2015  ? Injection 03/21/2015   ? Shortness of breath   ? upon exertion  ? Tick bite 01/18/2017  ? TOBACCO USE, QUIT 01/10/2010  ? Qualifier: Diagnosis of  By: Doralee Albino    ? ?Past Surgical History:  ?Procedure Laterality Date  ? ABDOMINAL HYSTERECTOMY    ? CATARACT EXTRACTION Bilateral 01/2016 and 03/2016  ? CHOLECYSTECTOMY N/A 10/03/2018  ? Procedure: LAPAROSCOPIC CHOLECYSTECTOMY WITH INTRAOPERATIVE CHOLANGIOGRAM;  Surgeon: Alphonsa Overall, MD;  Location: [facility];  Service: General;  Laterality: N/A;  ? LITHOTRIPSY    ? REFRACTIVE SURGERY Right 05/16/2016  ? SPLENECTOMY  1999  ? TONSILLECTOMY AND ADENOIDECTOMY    ? ?Patient Active Problem List  ? Diagnosis Date Noted  ?  Hyperkalemia   ? Constipation 03/16/2022  ? Renal failure 03/16/2022  ? Acute on chronic renal failure ([place]) 03/15/2022  ? Idiopathic thrombocythemia ([place]) 07/12/2021  ? Depression   ? Kidney stones   ? Shortness of breath   ? Mixed dyslipidemia 03/28/2020  ? Dyspnea on exertion 06/22/2019  ? Diabetes mellitus due to underlying condition with unspecified complications ([place]) 76/16/0737  ? Ex-smoker 06/22/2019  ? Chest discomfort 06/22/2019  ? Pericardial effusion 06/22/2019  ? Acute biliary pancreatitis 10/01/2018  ? Acute cholangitis 10/01/2018  ? COPD (chronic obstructive pulmonary disease) ([place]) 02/09/2018  ? Low back pain 04/25/2017  ? Tick bite 01/18/2017  ? Noncompliance 09/10/2016  ? Atrophic vaginitis 05/03/2015  ? Rotator cuff arthropathy 03/21/2015  ? Edema 09/09/2014  ? Kidney stone 07/27/2014  ? Epigastric pain 07/12/2014  ? Fever, unspecified 07/12/2014  ? Leg cramp 04/15/2014  ? Diabetes type 2, uncontrolled 12/27/2013  ? 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 Cough 05/23/2012  ? Pyelonephritis, acute 02/26/2011  ? Acute non-recurrent maxillary sinusitis 01/12/2011  ? C O P D WITH ACUTE EXACERBATION 03/21/2010  ? TOBACCO USE, QUIT 01/10/2010  ? Major depressive disorder, recurrent episode ([place]) 06/14/2009  ? CAROTID BRUIT 02/09/2008  ? Allergic rhinitis 01/08/2008  ? GASTROESOPHAGEAL REFLUX DISEASE 09/26/2007  ? LATERAL EPICONDYLITIS 09/26/2007  ? CALCIFIC TENDINITIS 09/26/2007  ? ADENOCARCINOMA, UTERUS, HX OF 09/26/2007  ? HIATAL HERNIA, HX OF 09/26/2007  ? HYSTERECTOMY, PARTIAL, HX OF 09/26/2007  ? COPD, frequent exacerbations ([place]) 09/15/2007  ? BARRETTS ESOPHAGUS 09/15/2007  ? ? ?ONSET DATE: 03/15/22 ? ?REFERRING DIAG: N17.9 (ICD-10-CM) - AKI (acute kidney injury) ([place]) R53.1 (ICD-10-CM) - Weakness  ? ?THERAPY DIAG:  ?Unsteadiness on feet ? ?Muscle weakness (generalized) ? ?SUBJECTIVE:  ?                                                                                                                                                                                            ? ?  SUBJECTIVE STATEMENT: ?Spent 4 days in hospital due to AKI  ?Pt accompanied by: self ? ?PERTINENT HISTORY: AKI ? ?PAIN:  ?Are you having pain? No ? ?PRECAUTIONS: Fall ? ?WEIGHT BEARING RESTRICTIONS No ? ?FALLS: Has patient fallen in last 6 months? No ? ?LIVING ENVIRONMENT: ?Lives with: lives alone ?Lives in: House/apartment ?Stairs: Yes: External: 5 steps; yes ?Has following equipment at home: Shower bench ? ?PLOF: Independent ? ?PATIENT GOALS To regain my prior level of strength and function ? ?OBJECTIVE:  ? ?DIAGNOSTIC FINDINGS: none noted ? ?COGNITION: ?Overall cognitive status: Within functional limits for tasks assessed ?  ?SENSATION: ?WFL ? ? ? ?MUSCLE TONE: WNL ? ? ?MUSCLE LENGTH: ?Not tested ? ? ?POSTURE: rounded shoulders, forward head, and decreased lumbar lordosis ? ?LE ROM:   WFL for tasks performed ? ?Active  Right ?03/22/2022 Left ?03/22/2022  ?Hip flexion    ?Hip extension    ?Hip abduction    ?Hip adduction    ?Hip internal rotation    ?Hip external rotation    ?Knee flexion    ?Knee extension    ?Ankle dorsiflexion    ?Ankle plantarflexion    ?Ankle inversion    ?Ankle eversion    ? (Blank rows = not tested) ? ?MMT:  [facility] for tasks performed ? ?MMT Right ?03/22/2022 Left ?03/22/2022  ?Hip flexion    ?Hip extension    ?Hip abduction    ?Hip adduction    ?Hip internal rotation    ?Hip external rotation    ?Knee flexion    ?Knee extension    ?Ankle dorsiflexion    ?Ankle plantarflexion    ?Ankle inversion    ?Ankle eversion    ?(Blank rows = not tested) ? ?BED MOBILITY:  ?Modified independent ? ?TRANSFERS: ?Requires UE support ? ? ? ?GAIT: ?Gait pattern: step through pattern ?Distance walked: 34f x2 ?Assistive device utilized: WEnvironmental consultant- 4 wheeled ?Level of assistance: SBA ?Comments: slow cadence ? ?FUNCTIONAL TESTs:  ?5 times sit to stand: 15s from rollator ?Berg Balance Scale: 53/56 ? ?PATIENT SURVEYS:  ?FOTO 46(63 predicted) ? ?TODAY'S  TREATMENT:  ?evaluation ? ? ?PATIENT EDUCATION: ?Education details: Discussed eval findings, rehab rationale and POC and patient is in agreement  ?Person educated: Patient ?Education method: Explanation ?Educati

## 2022-03-26 ENCOUNTER — Other Ambulatory Visit: Payer: Self-pay | Admitting: Family Medicine

## 2022-03-26 DIAGNOSIS — R269 Unspecified abnormalities of gait and mobility: Secondary | ICD-10-CM

## 2022-04-05 ENCOUNTER — Ambulatory Visit: Payer: HMO

## 2022-04-05 DIAGNOSIS — R2681 Unsteadiness on feet: Secondary | ICD-10-CM | POA: Diagnosis not present

## 2022-04-05 DIAGNOSIS — M6281 Muscle weakness (generalized): Secondary | ICD-10-CM

## 2022-04-05 NOTE — Therapy (Signed)
OUTPATIENT PHYSICAL THERAPY TREATMENT NOTE/DC SUMMARY   Patient Name: Pam Gamble MRN: [id number] DOB:November 21, 1945, 76 y.o., female Today's Date: 04/05/2022  PCP: Serita Grammes, MD REFERRING PROVIDER: Serita Grammes, MD PHYSICAL THERAPY DISCHARGE SUMMARY  Visits from Start of Care: 2  Current functional level related to goals / functional outcomes: Goals met   Remaining deficits: none   Education / Equipment: HEP   Patient agrees to discharge. Patient goals were met. Patient is being discharged due to being pleased with the current functional level.   END OF SESSION:   PT End of Session - 04/05/22 0915     Visit Number 2    Number of Visits 4    Date for PT Re-Evaluation 04/19/22    Authorization Type healthteam advantage    PT Start Time 0910    PT Stop Time 0940    PT Time Calculation (min) 30 min    Activity Tolerance Patient tolerated treatment well    Behavior During Therapy [facility] for tasks assessed/performed             Past Medical History:  Diagnosis Date   Abdominal pain, unspecified site 07/12/2014   8/15 lower R>L ? etiol - r/o pyelo and others    Acute biliary pancreatitis 10/01/2018   Acute cholangitis 10/01/2018   Acute non-recurrent maxillary sinusitis 01/12/2011   Qualifier: Diagnosis of  By: Madilyn Fireman MD, [name]     Allergic rhinitis 01/08/2008   Chronic     Atrophic vaginitis 05/03/2015   6/16    Barrett's esophagus    C O P D WITH ACUTE EXACERBATION 03/21/2010   9/13, 11/14, 8/18  [name], Leanna Sato.  Potential benefits of a short/long term steroid  use as well as potential risks  and complications were explained to the patient and were aknowledged.      Calcium deposits in tendon and bursa    CAROTID BRUIT 02/09/2008   Qualifier: Diagnosis of  By: Plotnikov MD, Evie Lacks    Chest discomfort 06/22/2019   COPD (chronic obstructive pulmonary disease) (HCC)    COPD, frequent exacerbations ([place]) 09/15/2007   Dr Vaughan Browner Chronic,  severe Advair Albuterol HHN prn Steroids - frequent use  Pt sleeps in a recliner since 2012    Cough 05/23/2012   Depression    Diabetes mellitus due to underlying condition with unspecified complications ([place]) 92/11/69   Diabetes type 2, uncontrolled 12/27/2013   2/15 2/17 Levemir - too $$$. Started Toujeo (10/17) samples Continue with current prescription therapy as reflected on the Med list - Levemir. Risks associated with treatment noncompliance were discussed. Compliance was encouraged. 2018 Metformin and NPH insulin 5/18 Use NPH insulin 40 u in am and 10 u at night. Titrate up by 1 unit a day each (AM and HS doses) for goal sugars of 100-130 up to 60    Dyspnea on exertion 06/22/2019   Edema 09/09/2014   10/15 B LE    Esophageal reflux    Ex-smoker 06/22/2019   Fever, unspecified 07/12/2014   8/15 mild T99F    HYSTERECTOMY, PARTIAL, HX OF 09/26/2007   Qualifier: Diagnosis of  By: Danny Lawless CMA, [place]     Idiopathic thrombocythemia ([place])    Kidney stone 07/27/2014   2014 Dr Janice Norrie   Kidney stones    Lateral epicondylitis  of elbow    Leg cramp 04/15/2014    Albuterol can give cramps    Low back pain 04/25/2017   Spinal stenosis   Major depressive disorder,  recurrent episode ([place]) 06/14/2009   Chronic      Mixed dyslipidemia 03/28/2020   Noncompliance 09/10/2016   Recurrent    Pericardial effusion 06/22/2019   Personal history of malignant neoplasm of other parts of uterus    Personal history of other diseases of digestive system    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   Remote  Levaquin Rx Vaccines    Pyelonephritis, acute 02/26/2011   2012, 2015 L    Rotator cuff arthropathy 03/21/2015   Injection 03/21/2015    Shortness of breath    upon exertion   Tick bite 01/18/2017   TOBACCO USE, QUIT 01/10/2010   Qualifier: Diagnosis of  By: Doralee Albino     Past Surgical History:  Procedure Laterality Date   ABDOMINAL HYSTERECTOMY     CATARACT EXTRACTION Bilateral 01/2016 and 03/2016    CHOLECYSTECTOMY N/A 10/03/2018   Procedure: LAPAROSCOPIC CHOLECYSTECTOMY WITH INTRAOPERATIVE CHOLANGIOGRAM;  Surgeon: Alphonsa Overall, MD;  Location: [facility];  Service: General;  Laterality: N/A;   LITHOTRIPSY     REFRACTIVE SURGERY Right 05/16/2016   SPLENECTOMY  1999   TONSILLECTOMY AND ADENOIDECTOMY     Patient Active Problem List   Diagnosis Date Noted   Hyperkalemia    Constipation 03/16/2022   Renal failure 03/16/2022   Acute on chronic renal failure ([place]) 03/15/2022   Idiopathic thrombocythemia ([place]) 07/12/2021   Depression    Kidney stones    Shortness of breath    Mixed dyslipidemia 03/28/2020   Dyspnea on exertion 06/22/2019   Diabetes mellitus due to underlying condition with unspecified complications ([place]) 76/28/3151   Ex-smoker 06/22/2019   Chest discomfort 06/22/2019   Pericardial effusion 06/22/2019   Acute biliary pancreatitis 10/01/2018   Acute cholangitis 10/01/2018   COPD (chronic obstructive pulmonary disease) ([place]) 02/09/2018   Low back pain 04/25/2017   Tick bite 01/18/2017   Noncompliance 09/10/2016   Atrophic vaginitis 05/03/2015   Rotator cuff arthropathy 03/21/2015   Edema 09/09/2014   Kidney stone 07/27/2014   Epigastric pain 07/12/2014   Fever, unspecified 07/12/2014   Leg cramp 04/15/2014   Diabetes type 2, uncontrolled 12/27/2013   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   Cough 05/23/2012   Pyelonephritis, acute 02/26/2011   Acute non-recurrent maxillary sinusitis 01/12/2011   C O P D WITH ACUTE EXACERBATION 03/21/2010   TOBACCO USE, QUIT 01/10/2010   Major depressive disorder, recurrent episode ([place]) 06/14/2009   CAROTID BRUIT 02/09/2008   Allergic rhinitis 01/08/2008   GASTROESOPHAGEAL REFLUX DISEASE 09/26/2007   LATERAL EPICONDYLITIS 09/26/2007   CALCIFIC TENDINITIS 09/26/2007   ADENOCARCINOMA, UTERUS, HX OF 09/26/2007   HIATAL HERNIA, HX OF 09/26/2007   HYSTERECTOMY, PARTIAL, HX OF 09/26/2007   COPD, frequent exacerbations ([place]) 09/15/2007    BARRETTS ESOPHAGUS 09/15/2007    REFERRING DIAG: N17.9 (ICD-10-CM) - AKI (acute kidney injury) ([place]) R53.1 (ICD-10-CM) - Weakness   THERAPY DIAG: Unsteadiness on feet   Muscle weakness (generalized)   Rationale for Evaluation and Treatment Rehabilitation  PERTINENT HISTORY: AKI  PRECAUTIONS: Fall  SUBJECTIVE: Rates herself at 80%, COPD main limitation.  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  PAIN:  Are you having pain? No   OBJECTIVE: (objective measures completed at initial evaluation unless otherwise dated)  OBJECTIVE:    DIAGNOSTIC FINDINGS: none noted   COGNITION: Overall cognitive status: Within functional limits for tasks assessed             SENSATION: WFL       MUSCLE TONE: WNL  MUSCLE LENGTH: Not tested     POSTURE: rounded shoulders, forward head, and decreased lumbar lordosis   LE ROM:   WFL for tasks performed   Active  Right 03/22/2022 Left 03/22/2022  Hip flexion      Hip extension      Hip abduction      Hip adduction      Hip internal rotation      Hip external rotation      Knee flexion      Knee extension      Ankle dorsiflexion      Ankle plantarflexion      Ankle inversion      Ankle eversion       (Blank rows = not tested)   MMT:  [facility] for tasks performed   MMT Right 03/22/2022 Left 03/22/2022  Hip flexion      Hip extension      Hip abduction      Hip adduction      Hip internal rotation      Hip external rotation      Knee flexion      Knee extension      Ankle dorsiflexion      Ankle plantarflexion      Ankle inversion      Ankle eversion      (Blank rows = not tested)   BED MOBILITY:  Modified independent   TRANSFERS: Requires UE support       GAIT: Gait pattern: step through pattern Distance walked: 10f x2 Assistive device utilized: WEnvironmental consultant- 4 wheeled Level of assistance: SBA Comments: slow cadence   FUNCTIONAL TESTs:  5 times sit to stand: 15s  from rollator Berg Balance Scale: 53/56   PATIENT SURVEYS:  FOTO 46(63 predicted); 04/05/22 72%   [facility]                                                DATE: 04/05/22 Therapeutic Exercise: 6045fambulation w/o AD, 98% O2 sats 16 steps with step through pattern and single rail support 5x STS w/o UE support 5 deadlifts with 10# KB      PATIENT EDUCATION: Education details: Discussed eval findings, rehab rationale and POC and patient is in agreement  Person educated: Patient Education method: Explanation Education comprehension: verbalized understanding, returned demonstration, and needs further education     HOME EXERCISE PROGRAM: Access Code: [email or web address] URL: [email or web address] Date: 03/22/2022 Prepared by: JeSharlynn Oliphant Exercises - Sit to Stand with Arms Crossed  - 2 x daily - 7 x weekly - 2 sets - 5 reps       GOALS: Goals reviewed with patient? Yes   SHORT TERM GOALS: STGs=LTGs       LONG TERM GOALS: Target date: 04/19/2022    Patient to demonstrate independence in HEP  Baseline: Access Code: [email or web address] Goal status: Met   2.  Ambulate 40045fith LRAD Baseline: 27f8fth rollator; 04/05/22 600ft47f AD Goal status: Met   3.  Increase FOTO score to 63 Baseline: 46; 04/05/22 72 Goal status: Met       ASSESSMENT:   CLINICAL IMPRESSION: All rehab goals met, patient has returned to ADLs w/o limitation, she has discontinued using her rollator, all functional testing dos not reveal deficits     OBJECTIVE IMPAIRMENTS Abnormal gait, decreased  endurance, decreased knowledge of condition, decreased mobility, and postural dysfunction.    ACTIVITY LIMITATIONS community activity, driving, and yard work.    PERSONAL FACTORS Time since onset of injury/illness/exacerbation and recent hospitalization  are also affecting patient's functional outcome.      REHAB POTENTIAL: Good   CLINICAL DECISION MAKING:  Stable/uncomplicated   EVALUATION COMPLEXITY: Low   PLAN: PT FREQUENCY: 1x/week   PT DURATION: 4 weeks   PLANNED INTERVENTIONS: Therapeutic exercises, Therapeutic activity, Neuromuscular re-education, Balance training, Gait training, Patient/Family education, Joint mobilization, Stair training, and DME instructions   PLAN FOR NEXT SESSION: DC   Lanice Shirts, PT 04/05/2022, 9:16 AM

## 2022-04-10 DIAGNOSIS — R918 Other nonspecific abnormal finding of lung field: Secondary | ICD-10-CM | POA: Diagnosis not present

## 2022-04-10 DIAGNOSIS — G4761 Periodic limb movement disorder: Secondary | ICD-10-CM | POA: Diagnosis not present

## 2022-04-10 DIAGNOSIS — J301 Allergic rhinitis due to pollen: Secondary | ICD-10-CM | POA: Diagnosis not present

## 2022-04-10 DIAGNOSIS — R5383 Other fatigue: Secondary | ICD-10-CM | POA: Diagnosis not present

## 2022-04-10 DIAGNOSIS — J449 Chronic obstructive pulmonary disease, unspecified: Secondary | ICD-10-CM | POA: Diagnosis not present

## 2022-04-11 DIAGNOSIS — E119 Type 2 diabetes mellitus without complications: Secondary | ICD-10-CM | POA: Diagnosis not present

## 2022-04-11 DIAGNOSIS — R1011 Right upper quadrant pain: Secondary | ICD-10-CM | POA: Diagnosis not present

## 2022-04-11 DIAGNOSIS — N179 Acute kidney failure, unspecified: Secondary | ICD-10-CM | POA: Diagnosis not present

## 2022-04-11 DIAGNOSIS — Z6828 Body mass index (BMI) 28.0-28.9, adult: Secondary | ICD-10-CM | POA: Diagnosis not present

## 2022-04-11 DIAGNOSIS — J449 Chronic obstructive pulmonary disease, unspecified: Secondary | ICD-10-CM | POA: Diagnosis not present

## 2022-04-11 DIAGNOSIS — Z794 Long term (current) use of insulin: Secondary | ICD-10-CM | POA: Diagnosis not present

## 2022-04-12 ENCOUNTER — Ambulatory Visit: Payer: HMO

## 2022-04-15 DIAGNOSIS — Z9049 Acquired absence of other specified parts of digestive tract: Secondary | ICD-10-CM | POA: Diagnosis not present

## 2022-04-15 DIAGNOSIS — Z87448 Personal history of other diseases of urinary system: Secondary | ICD-10-CM | POA: Diagnosis not present

## 2022-04-15 DIAGNOSIS — Z8619 Personal history of other infectious and parasitic diseases: Secondary | ICD-10-CM | POA: Diagnosis not present

## 2022-04-15 DIAGNOSIS — Z7985 Long-term (current) use of injectable non-insulin antidiabetic drugs: Secondary | ICD-10-CM | POA: Diagnosis not present

## 2022-04-15 DIAGNOSIS — N200 Calculus of kidney: Secondary | ICD-10-CM | POA: Diagnosis not present

## 2022-04-15 DIAGNOSIS — Z8639 Personal history of other endocrine, nutritional and metabolic disease: Secondary | ICD-10-CM | POA: Diagnosis not present

## 2022-04-15 DIAGNOSIS — Z8719 Personal history of other diseases of the digestive system: Secondary | ICD-10-CM | POA: Diagnosis not present

## 2022-04-15 DIAGNOSIS — Z9081 Acquired absence of spleen: Secondary | ICD-10-CM | POA: Diagnosis not present

## 2022-04-15 DIAGNOSIS — R630 Anorexia: Secondary | ICD-10-CM | POA: Diagnosis not present

## 2022-04-15 DIAGNOSIS — R1011 Right upper quadrant pain: Secondary | ICD-10-CM | POA: Diagnosis not present

## 2022-04-16 DIAGNOSIS — R1011 Right upper quadrant pain: Secondary | ICD-10-CM | POA: Diagnosis not present

## 2022-05-10 DIAGNOSIS — E119 Type 2 diabetes mellitus without complications: Secondary | ICD-10-CM | POA: Diagnosis not present

## 2022-05-10 DIAGNOSIS — I10 Essential (primary) hypertension: Secondary | ICD-10-CM | POA: Diagnosis not present

## 2022-05-10 DIAGNOSIS — J449 Chronic obstructive pulmonary disease, unspecified: Secondary | ICD-10-CM | POA: Diagnosis not present

## 2022-05-13 DIAGNOSIS — J449 Chronic obstructive pulmonary disease, unspecified: Secondary | ICD-10-CM | POA: Diagnosis not present

## 2022-05-13 DIAGNOSIS — I5030 Unspecified diastolic (congestive) heart failure: Secondary | ICD-10-CM | POA: Diagnosis not present

## 2022-05-13 DIAGNOSIS — Z1331 Encounter for screening for depression: Secondary | ICD-10-CM | POA: Diagnosis not present

## 2022-05-13 DIAGNOSIS — E119 Type 2 diabetes mellitus without complications: Secondary | ICD-10-CM | POA: Diagnosis not present

## 2022-05-13 DIAGNOSIS — Z0000 Encounter for general adult medical examination without abnormal findings: Secondary | ICD-10-CM | POA: Diagnosis not present

## 2022-05-13 DIAGNOSIS — Z794 Long term (current) use of insulin: Secondary | ICD-10-CM | POA: Diagnosis not present

## 2022-05-13 DIAGNOSIS — Z79899 Other long term (current) drug therapy: Secondary | ICD-10-CM | POA: Diagnosis not present

## 2022-05-13 DIAGNOSIS — Z6828 Body mass index (BMI) 28.0-28.9, adult: Secondary | ICD-10-CM | POA: Diagnosis not present

## 2022-05-13 DIAGNOSIS — I700 Atherosclerosis of aorta: Secondary | ICD-10-CM | POA: Diagnosis not present

## 2022-05-13 DIAGNOSIS — N1831 Chronic kidney disease, stage 3a: Secondary | ICD-10-CM | POA: Diagnosis not present

## 2022-05-30 DIAGNOSIS — E119 Type 2 diabetes mellitus without complications: Secondary | ICD-10-CM | POA: Diagnosis not present

## 2022-05-30 DIAGNOSIS — Z91119 Patient's noncompliance with dietary regimen due to unspecified reason: Secondary | ICD-10-CM | POA: Diagnosis not present

## 2022-05-30 DIAGNOSIS — Z794 Long term (current) use of insulin: Secondary | ICD-10-CM | POA: Diagnosis not present

## 2022-05-30 DIAGNOSIS — D692 Other nonthrombocytopenic purpura: Secondary | ICD-10-CM | POA: Diagnosis not present

## 2022-05-30 DIAGNOSIS — Z7985 Long-term (current) use of injectable non-insulin antidiabetic drugs: Secondary | ICD-10-CM | POA: Diagnosis not present

## 2022-05-30 DIAGNOSIS — R1011 Right upper quadrant pain: Secondary | ICD-10-CM | POA: Diagnosis not present

## 2022-08-12 DIAGNOSIS — E119 Type 2 diabetes mellitus without complications: Secondary | ICD-10-CM | POA: Diagnosis not present

## 2022-08-12 DIAGNOSIS — Z794 Long term (current) use of insulin: Secondary | ICD-10-CM | POA: Diagnosis not present

## 2022-08-12 DIAGNOSIS — M189 Osteoarthritis of first carpometacarpal joint, unspecified: Secondary | ICD-10-CM | POA: Diagnosis not present

## 2022-08-12 DIAGNOSIS — N1832 Chronic kidney disease, stage 3b: Secondary | ICD-10-CM | POA: Diagnosis not present

## 2022-08-12 DIAGNOSIS — J449 Chronic obstructive pulmonary disease, unspecified: Secondary | ICD-10-CM | POA: Diagnosis not present

## 2022-08-12 DIAGNOSIS — Z6827 Body mass index (BMI) 27.0-27.9, adult: Secondary | ICD-10-CM | POA: Diagnosis not present

## 2023-01-14 ENCOUNTER — Ambulatory Visit: Payer: HMO | Admitting: Cardiology

## 2023-02-14 DIAGNOSIS — I700 Atherosclerosis of aorta: Secondary | ICD-10-CM | POA: Diagnosis not present

## 2023-02-14 DIAGNOSIS — Z6827 Body mass index (BMI) 27.0-27.9, adult: Secondary | ICD-10-CM | POA: Diagnosis not present

## 2023-02-14 DIAGNOSIS — I5030 Unspecified diastolic (congestive) heart failure: Secondary | ICD-10-CM | POA: Diagnosis not present

## 2023-02-14 DIAGNOSIS — J441 Chronic obstructive pulmonary disease with (acute) exacerbation: Secondary | ICD-10-CM | POA: Diagnosis not present

## 2023-02-14 DIAGNOSIS — Z794 Long term (current) use of insulin: Secondary | ICD-10-CM | POA: Diagnosis not present

## 2023-02-14 DIAGNOSIS — E119 Type 2 diabetes mellitus without complications: Secondary | ICD-10-CM | POA: Diagnosis not present

## 2023-02-14 LAB — LAB REPORT - SCANNED
A1c: 8.7
Albumin, Urine POC: 108.8
Albumin/Creatinine Ratio, Urine, POC: 93
Creatinine, Random Urine: 117.5
EGFR: 42

## 2023-03-03 ENCOUNTER — Ambulatory Visit: Payer: HMO | Admitting: Cardiology

## 2023-03-10 DIAGNOSIS — J439 Emphysema, unspecified: Secondary | ICD-10-CM | POA: Diagnosis not present

## 2023-03-10 DIAGNOSIS — I5030 Unspecified diastolic (congestive) heart failure: Secondary | ICD-10-CM | POA: Diagnosis not present

## 2023-03-10 DIAGNOSIS — E1169 Type 2 diabetes mellitus with other specified complication: Secondary | ICD-10-CM | POA: Diagnosis not present

## 2023-03-10 DIAGNOSIS — R0981 Nasal congestion: Secondary | ICD-10-CM | POA: Diagnosis not present

## 2023-03-10 DIAGNOSIS — N1832 Chronic kidney disease, stage 3b: Secondary | ICD-10-CM | POA: Diagnosis not present

## 2023-03-10 DIAGNOSIS — E669 Obesity, unspecified: Secondary | ICD-10-CM | POA: Diagnosis not present

## 2023-03-10 DIAGNOSIS — Z6828 Body mass index (BMI) 28.0-28.9, adult: Secondary | ICD-10-CM | POA: Diagnosis not present

## 2023-03-11 DIAGNOSIS — E119 Type 2 diabetes mellitus without complications: Secondary | ICD-10-CM | POA: Diagnosis not present

## 2023-03-11 DIAGNOSIS — J449 Chronic obstructive pulmonary disease, unspecified: Secondary | ICD-10-CM | POA: Diagnosis not present

## 2023-03-11 DIAGNOSIS — I5030 Unspecified diastolic (congestive) heart failure: Secondary | ICD-10-CM | POA: Diagnosis not present

## 2023-04-29 ENCOUNTER — Ambulatory Visit: Payer: HMO | Admitting: Cardiology

## 2023-05-11 DIAGNOSIS — J449 Chronic obstructive pulmonary disease, unspecified: Secondary | ICD-10-CM | POA: Diagnosis not present

## 2023-05-11 DIAGNOSIS — I5030 Unspecified diastolic (congestive) heart failure: Secondary | ICD-10-CM | POA: Diagnosis not present

## 2023-05-11 DIAGNOSIS — E119 Type 2 diabetes mellitus without complications: Secondary | ICD-10-CM | POA: Diagnosis not present

## 2023-05-26 DIAGNOSIS — E119 Type 2 diabetes mellitus without complications: Secondary | ICD-10-CM | POA: Diagnosis not present

## 2023-05-26 DIAGNOSIS — Z79899 Other long term (current) drug therapy: Secondary | ICD-10-CM | POA: Diagnosis not present

## 2023-05-26 DIAGNOSIS — I700 Atherosclerosis of aorta: Secondary | ICD-10-CM | POA: Diagnosis not present

## 2023-05-26 DIAGNOSIS — E1169 Type 2 diabetes mellitus with other specified complication: Secondary | ICD-10-CM | POA: Diagnosis not present

## 2023-05-26 DIAGNOSIS — N1832 Chronic kidney disease, stage 3b: Secondary | ICD-10-CM | POA: Diagnosis not present

## 2023-05-26 DIAGNOSIS — J441 Chronic obstructive pulmonary disease with (acute) exacerbation: Secondary | ICD-10-CM | POA: Diagnosis not present

## 2023-05-26 DIAGNOSIS — Z6828 Body mass index (BMI) 28.0-28.9, adult: Secondary | ICD-10-CM | POA: Diagnosis not present

## 2023-05-26 DIAGNOSIS — E785 Hyperlipidemia, unspecified: Secondary | ICD-10-CM | POA: Diagnosis not present

## 2023-05-26 DIAGNOSIS — Z9181 History of falling: Secondary | ICD-10-CM | POA: Diagnosis not present

## 2023-05-26 DIAGNOSIS — Z794 Long term (current) use of insulin: Secondary | ICD-10-CM | POA: Diagnosis not present

## 2023-05-26 DIAGNOSIS — I5030 Unspecified diastolic (congestive) heart failure: Secondary | ICD-10-CM | POA: Diagnosis not present

## 2023-05-26 DIAGNOSIS — Z0000 Encounter for general adult medical examination without abnormal findings: Secondary | ICD-10-CM | POA: Diagnosis not present

## 2023-06-26 ENCOUNTER — Ambulatory Visit: Payer: HMO | Admitting: Cardiology

## 2023-07-29 ENCOUNTER — Ambulatory Visit: Payer: HMO | Admitting: Cardiology

## 2023-07-31 DIAGNOSIS — I75021 Atheroembolism of right lower extremity: Secondary | ICD-10-CM | POA: Diagnosis not present

## 2023-07-31 DIAGNOSIS — Z6827 Body mass index (BMI) 27.0-27.9, adult: Secondary | ICD-10-CM | POA: Diagnosis not present

## 2023-07-31 DIAGNOSIS — J441 Chronic obstructive pulmonary disease with (acute) exacerbation: Secondary | ICD-10-CM | POA: Diagnosis not present

## 2023-08-02 DIAGNOSIS — J449 Chronic obstructive pulmonary disease, unspecified: Secondary | ICD-10-CM | POA: Diagnosis not present

## 2023-08-02 DIAGNOSIS — E1165 Type 2 diabetes mellitus with hyperglycemia: Secondary | ICD-10-CM | POA: Diagnosis not present

## 2023-08-02 DIAGNOSIS — Z794 Long term (current) use of insulin: Secondary | ICD-10-CM | POA: Diagnosis not present

## 2023-08-04 ENCOUNTER — Other Ambulatory Visit (HOSPITAL_COMMUNITY): Payer: Self-pay | Admitting: Vascular Surgery

## 2023-08-04 ENCOUNTER — Ambulatory Visit (HOSPITAL_COMMUNITY)
Admission: RE | Admit: 2023-08-04 | Discharge: 2023-08-04 | Disposition: A | Discharge status: Home / Self Care | Payer: HMO | Source: Ambulatory Visit | Attending: Surgery | Admitting: Surgery

## 2023-08-04 DIAGNOSIS — I75029 Atheroembolism of unspecified lower extremity: Secondary | ICD-10-CM

## 2023-08-04 LAB — VAS US ABI WITH/WO TBI
Left ABI: 1.11
Right ABI: 1.09

## 2023-08-07 ENCOUNTER — Encounter: Payer: Self-pay | Admitting: Vascular Surgery

## 2023-08-07 ENCOUNTER — Ambulatory Visit: Payer: HMO | Admitting: Vascular Surgery

## 2023-08-07 VITALS — BP 123/70 | HR 65 | Temp 98.0°F | Resp 20 | Ht 62.0 in | Wt 158.0 lb

## 2023-08-07 DIAGNOSIS — L819 Disorder of pigmentation, unspecified: Secondary | ICD-10-CM

## 2023-08-07 NOTE — Progress Notes (Signed)
Patient ID: Pam Gamble, female   DOB: 27-Aug-1946, 77 y.o.   MRN: [id number]  Reason for Consult: New Patient (Initial Visit)   Referred by [name], MD  Subjective:     HPI:  Pam Gamble is a 77 y.o. female without previous history of vascular disease.  She does have congestive heart failure and also COPD and diabetes as risk factors she is on aspirin daily.  Recently she had blue-reddish discoloration of her right first and second toe with associated swelling.  She did not note any trauma at the time.  This is subsequently resolved.  She has no associated pain.  She has never had issues like this before and there has been no recurrence.  She walks without limitation and attempts to stay active even recently she has begun bike riding.  She has never had stroke, TIA or amaurosis and no previous coronary artery interventions.  No personal or family history of aneurysm disease.  Past Medical History:  Diagnosis Date   Acute biliary pancreatitis 10/01/2018   Acute cholangitis 10/01/2018   Acute non-recurrent maxillary sinusitis 01/12/2011   Qualifier: Diagnosis of  By: [name], [name]     Acute on chronic renal failure (HCC) 03/15/2022   Allergic rhinitis 01/08/2008   Chronic     Atrophic vaginitis 05/03/2015   6/16    Barrett's esophagus    C O P D WITH ACUTE EXACERBATION 03/21/2010   9/13, 11/14, 8/18  [name], [name].  Potential benefits of a short/long term steroid  use as well as potential risks  and complications were explained to the patient and were aknowledged.      Calcium deposits in tendon and bursa    CAROTID BRUIT 02/09/2008   Qualifier: Diagnosis of  By: Plotnikov MD, Georgina Quint    Chest discomfort 06/22/2019   COPD (chronic obstructive pulmonary disease) (HCC)    COPD, frequent exacerbations (HCC) 09/15/2007   [name] Chronic, severe Advair Albuterol HHN prn Steroids - frequent use  Pt sleeps in a recliner since 2012    Cough 05/23/2012    Depression    Diabetes mellitus due to underlying condition with unspecified complications (HCC) 06/22/2019   Diabetes type 2, uncontrolled 12/27/2013   2/15 2/17 Levemir - too $$$. Started Toujeo (10/17) samples Continue with current prescription therapy as reflected on the Med list - Levemir. Risks associated with treatment noncompliance were discussed. Compliance was encouraged. 2018 Metformin and NPH insulin 5/18 Use NPH insulin 40 u in am and 10 u at night. Titrate up by 1 unit a day each (AM and HS doses) for goal sugars of 100-130 up to 60    Dyspnea on exertion 06/22/2019   Edema 09/09/2014   10/15 B LE    Epigastric pain 07/12/2014   8/15 lower R>L ? etiol - r/o pyelo and others   Esophageal reflux    Ex-smoker 06/22/2019   Fever, unspecified 07/12/2014   8/15 mild T99F    Hyperkalemia    HYSTERECTOMY, PARTIAL, HX OF 09/26/2007   Qualifier: Diagnosis of   By: [name], [name]      Replacing diagnoses that were inactivated after the 02/10/23 regulatory import   Idiopathic thrombocythemia (HCC)    Kidney stone 07/27/2014   2014 [name]   Kidney stones    LATERAL EPICONDYLITIS 09/26/2007   Qualifier: History of   By: [name], [name]      Replacing diagnoses that were inactivated after the 02/10/23 regulatory  import   Leg cramp 04/15/2014    Albuterol can give cramps    Low back pain 04/25/2017   Spinal stenosis   Major depressive disorder, recurrent episode (HCC) 06/14/2009   Chronic      Mixed dyslipidemia 03/28/2020   Noncompliance 09/10/2016   Recurrent    Pericardial effusion 06/22/2019   Personal history of malignant neoplasm of other parts of uterus    Personal history of other diseases of digestive system    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   Remote  Levaquin Rx Vaccines    Pyelonephritis, acute 02/26/2011   2012, 2015 L    Renal failure 03/16/2022   Rotator cuff arthropathy 03/21/2015   Injection 03/21/2015    Shortness of breath    upon exertion   Tick bite  01/18/2017   TOBACCO USE, QUIT 01/10/2010   Qualifier: Diagnosis of  By: [name]     Family History  Problem Relation Age of Onset   Liver cancer Sister    Pancreatic cancer Sister    Stroke Mother    Heart disease Mother        CHF   Heart disease Father        MI   Diabetes Father    Diabetes Sister    Diabetes Maternal Aunt    Past Surgical History:  Procedure Laterality Date   ABDOMINAL HYSTERECTOMY     CATARACT EXTRACTION Bilateral 01/2016 and 03/2016   CHOLECYSTECTOMY N/A 10/03/2018   Procedure: LAPAROSCOPIC CHOLECYSTECTOMY WITH INTRAOPERATIVE CHOLANGIOGRAM;  Surgeon: [name], MD;  Location: [facility];  Service: General;  Laterality: N/A;   LITHOTRIPSY     REFRACTIVE SURGERY Right 05/16/2016   SPLENECTOMY  1999   TONSILLECTOMY AND ADENOIDECTOMY      Short Social History:  Social History   Tobacco Use   Smoking status: Former    Current packs/day: 0.00    Average packs/day: 2.0 packs/day for 49.0 years (98.0 ttl pk-yrs)    Types: Cigarettes    Start date: 11/11/1956    Quit date: 11/11/2005    Years since quitting: 17.7   Smokeless tobacco: Never  Substance Use Topics   Alcohol use: Yes    Alcohol/week: 0.0 standard drinks of alcohol    Comment: beer, whiskey, margherita    Allergies  Allergen Reactions   Amoxicillin-Pot Clavulanate Nausea Only and Other (See Comments)    REACTION: diarrhea "sick to stomach" Has patient had a PCN reaction causing immediate rash, facial/tongue/throat swelling, SOB or lightheadedness with hypotension: no Has patient had a PCN reaction causing severe rash involving mucus membranes or skin necrosis: no Has patient had a PCN reaction that required hospitalization: no Has patient had a PCN reaction occurring within the last 10 years: no If all of the above answers are "NO", then may proceed with Cephalosporin use.    Levaquin [Levofloxacin] Nausea Only   Ertugliflozin Anxiety    Steglatro -  - "it made me feel funny"      Current Outpatient Medications  Medication Sig Dispense Refill   albuterol (PROVENTIL) (2.5 MG/3ML) 0.083% nebulizer solution Take 2.5 mg by nebulization every 8 (eight) hours.     aspirin EC 81 MG tablet Take 81 mg by mouth daily.     Azelastine HCl 137 MCG/SPRAY SOLN Place 2 sprays into both nostrils 2 (two) times daily.     Cholecalciferol (VITAMIN D3) 25 MCG (1000 UT) CAPS Take 1,000 Units by mouth daily.     Cyanocobalamin (B-12 PO) Take 1 tablet by mouth  daily.     Fluticasone-Umeclidin-Vilant (TRELEGY ELLIPTA) 100-62.5-25 MCG/INH AEPB Inhale 1 puff into the lungs daily. 1 each 5   HYDROcodone-acetaminophen (NORCO/VICODIN) 5-325 MG tablet Take 1 tablet by mouth every 8 (eight) hours as needed for moderate pain.     levocetirizine (XYZAL) 5 MG tablet Take 5 mg by mouth at bedtime.     montelukast (SINGULAIR) 10 MG tablet Take 10 mg by mouth daily.     omeprazole (PRILOSEC) 40 MG capsule Take 40 mg by mouth 2 (two) times daily.     ondansetron (ZOFRAN-ODT) 4 MG disintegrating tablet Take 4 mg by mouth 3 (three) times daily.     OZEMPIC, 1 MG/DOSE, 4 MG/3ML SOPN Inject 1 mg into the skin once a week.     rOPINIRole (REQUIP) 2 MG tablet Take 2 mg by mouth at bedtime.     rosuvastatin (CRESTOR) 20 MG tablet Take 20 mg by mouth at bedtime.     simethicone (MYLICON) 80 MG chewable tablet Chew 1 tablet (80 mg total) by mouth 4 (four) times daily as needed for flatulence (dyspepsia, indigestion). 30 tablet 0   tamsulosin (FLOMAX) 0.4 MG CAPS capsule Take 0.4 mg by mouth daily.     TRESIBA FLEXTOUCH 100 UNIT/ML FlexTouch Pen Inject 30 Units into the skin daily.     vitamin C (ASCORBIC ACID) 500 MG tablet Take 500 mg by mouth daily.     No current facility-administered medications for this visit.    Review of Systems  Constitutional:  Constitutional negative. HENT: HENT negative.  Eyes: Eyes negative.  Respiratory: Respiratory negative.  Cardiovascular: Cardiovascular negative.  GI:  Gastrointestinal negative.  Musculoskeletal: Musculoskeletal negative.       Right first and second toe discoloration with swelling now resolved Skin: Skin negative.  Neurological: Neurological negative. Hematologic: Hematologic/lymphatic negative.  Psychiatric: Psychiatric negative.        Objective:  Objective   Vitals:   08/07/23 1037  BP: 123/70  Pulse: 65  Resp: 20  Temp: 98 F (36.7 C)  SpO2: 92%  Weight: 158 lb (71.7 kg)  Height: 5\' 2"  (1.575 m)   Body mass index is 28.9 kg/m.  Physical Exam HENT:     Head: Normocephalic.     Nose: Nose normal.  Eyes:     Pupils: Pupils are equal, round, and reactive to light.  Neck:     Vascular: No carotid bruit.  Cardiovascular:     Pulses: Normal pulses.  Pulmonary:     Effort: Pulmonary effort is normal.  Abdominal:     General: Abdomen is flat.     Palpations: Abdomen is soft.  Musculoskeletal:     Cervical back: Normal range of motion and neck supple. No tenderness.     Right lower leg: No edema.     Left lower leg: No edema.  Skin:    General: Skin is warm.     Capillary Refill: Capillary refill takes less than 2 seconds.  Neurological:     General: No focal deficit present.     Mental Status: She is alert.  Psychiatric:        Mood and Affect: Mood normal.        Thought Content: Thought content normal.        Judgment: Judgment normal.     Data: ABI Findings:  +---------+------------------+-----+---------+--------+  Right   Rt Pressure (mmHg)IndexWaveform Comment   +---------+------------------+-----+---------+--------+  Brachial 132                                       +---------+------------------+-----+---------+--------+  PTA     151               1.09 triphasic          +---------+------------------+-----+---------+--------+  DP      141               1.02 triphasic          +---------+------------------+-----+---------+--------+  Great Toe106               0.77                     +---------+------------------+-----+---------+--------+   +---------+------------------+-----+---------+-------+  Left    Lt Pressure (mmHg)IndexWaveform Comment  +---------+------------------+-----+---------+-------+  Brachial 138                                      +---------+------------------+-----+---------+-------+  PTA     153               1.11 triphasic         +---------+------------------+-----+---------+-------+  DP      144               1.04 triphasic         +---------+------------------+-----+---------+-------+  Great Toe118               0.86                   +---------+------------------+-----+---------+-------+   +-------+-----------+-----------+------------+------------+  ABI/TBIToday's ABIToday's TBIPrevious ABIPrevious TBI  +-------+-----------+-----------+------------+------------+  Right 1.09       0.77       0.9                       +-------+-----------+-----------+------------+------------+  Left  1.11       0.86       0.88                      +-------+-----------+-----------+------------+------------+        PCP office ABI on 07/31/23.    Summary:  Right: Resting right ankle-brachial index is within normal range. The  right toe-brachial index is normal.   Left: Resting left ankle-brachial index is within normal range. The left  toe-brachial index is normal.      Assessment/Plan:    77 year old female with recent history as noted above but all symptoms have resolved there is no evidence of any venous or arterial insufficiency at this time with palpable pulses.  As such I have recommended continued activity as tolerated with protection of her feet given her underlying diabetes and she can see me on an as-needed basis.     Maeola Harman MD [facility]

## 2023-08-22 ENCOUNTER — Encounter: Payer: HMO | Admitting: Vascular Surgery

## 2023-08-22 DIAGNOSIS — R519 Headache, unspecified: Secondary | ICD-10-CM | POA: Diagnosis not present

## 2023-08-22 DIAGNOSIS — R0981 Nasal congestion: Secondary | ICD-10-CM | POA: Diagnosis not present

## 2023-08-22 DIAGNOSIS — R051 Acute cough: Secondary | ICD-10-CM | POA: Diagnosis not present

## 2023-08-22 DIAGNOSIS — R509 Fever, unspecified: Secondary | ICD-10-CM | POA: Diagnosis not present

## 2023-08-22 DIAGNOSIS — E119 Type 2 diabetes mellitus without complications: Secondary | ICD-10-CM | POA: Diagnosis not present

## 2023-08-22 DIAGNOSIS — J181 Lobar pneumonia, unspecified organism: Secondary | ICD-10-CM | POA: Diagnosis not present

## 2023-08-22 DIAGNOSIS — K529 Noninfective gastroenteritis and colitis, unspecified: Secondary | ICD-10-CM | POA: Diagnosis not present

## 2023-08-27 DIAGNOSIS — J441 Chronic obstructive pulmonary disease with (acute) exacerbation: Secondary | ICD-10-CM | POA: Diagnosis not present

## 2023-08-27 DIAGNOSIS — N3001 Acute cystitis with hematuria: Secondary | ICD-10-CM | POA: Diagnosis not present

## 2023-08-27 DIAGNOSIS — Z6828 Body mass index (BMI) 28.0-28.9, adult: Secondary | ICD-10-CM | POA: Diagnosis not present

## 2023-08-27 DIAGNOSIS — J189 Pneumonia, unspecified organism: Secondary | ICD-10-CM | POA: Diagnosis not present

## 2023-08-27 DIAGNOSIS — R0602 Shortness of breath: Secondary | ICD-10-CM | POA: Diagnosis not present

## 2023-08-28 DIAGNOSIS — J441 Chronic obstructive pulmonary disease with (acute) exacerbation: Secondary | ICD-10-CM | POA: Diagnosis not present

## 2023-08-28 DIAGNOSIS — J189 Pneumonia, unspecified organism: Secondary | ICD-10-CM | POA: Diagnosis not present

## 2023-08-28 DIAGNOSIS — Z6829 Body mass index (BMI) 29.0-29.9, adult: Secondary | ICD-10-CM | POA: Diagnosis not present

## 2023-09-09 ENCOUNTER — Encounter: Payer: Self-pay | Admitting: Cardiology

## 2023-09-09 ENCOUNTER — Ambulatory Visit: Payer: PPO | Attending: Cardiology | Admitting: Cardiology

## 2023-09-09 VITALS — BP 124/60 | HR 70 | Ht 62.0 in | Wt 160.0 lb

## 2023-09-09 DIAGNOSIS — J431 Panlobular emphysema: Secondary | ICD-10-CM | POA: Diagnosis not present

## 2023-09-09 DIAGNOSIS — E782 Mixed hyperlipidemia: Secondary | ICD-10-CM | POA: Diagnosis not present

## 2023-09-09 DIAGNOSIS — I3139 Other pericardial effusion (noninflammatory): Secondary | ICD-10-CM

## 2023-09-09 DIAGNOSIS — I2510 Atherosclerotic heart disease of native coronary artery without angina pectoris: Secondary | ICD-10-CM | POA: Diagnosis not present

## 2023-09-09 HISTORY — DX: Atherosclerotic heart disease of native coronary artery without angina pectoris: I25.10

## 2023-09-09 NOTE — Patient Instructions (Addendum)
Medication Instructions:  Your physician recommends that you continue on your current medications as directed. Please refer to the Current Medication list given to you today.  *If you need a refill on your cardiac medications before your next appointment, please call your pharmacy*   Lab Work: None Ordered If you have labs (blood work) drawn today and your tests are completely normal, you will receive your results only by: [place] (if you have MyChart) OR A paper copy in the mail If you have any lab test that is abnormal or we need to change your treatment, we will call you to review the results.   Testing/Procedures: Your physician has requested that you have an echocardiogram. Echocardiography is a painless test that uses sound waves to create images of your heart. It provides your doctor with information about the size and shape of your heart and how well your heart's chambers and valves are working. This procedure takes approximately one hour. There are no restrictions for this procedure. Please do NOT wear cologne, perfume, aftershave, or lotions (deodorant is allowed). Please arrive 15 minutes prior to your appointment time.    Follow-Up: At [facility], you and your health needs are our priority.  As part of our continuing mission to provide you with exceptional heart care, we have created designated Provider Care Teams.  These Care Teams include your primary Cardiologist (physician) and Advanced Practice Providers (APPs -  Physician Assistants and Nurse Practitioners) who all work together to provide you with the care you need, when you need it.  We recommend signing up for the patient portal called "MyChart".  Sign up information is provided on this After Visit Summary.  MyChart is used to connect with patients for Virtual Visits (Telemedicine).  Patients are able to view lab/test results, encounter notes, upcoming appointments, etc.  Non-urgent messages can be sent to your  provider as well.   To learn more about what you can do with MyChart, go to [email or web address].    Your next appointment:   9 month(s)  The format for your next appointment:   In Person  Provider:   Jyl Heinz MD    Other Instructions NA

## 2023-09-09 NOTE — Progress Notes (Signed)
Cardiology Office Note:    Date:  09/09/2023   ID:  Renold Don, DOB September 10, 1946, MRN [id number]  PCP:  [name], MD  Cardiologist:  Garwin Brothers, MD   Referring MD: [name], MD    ASSESSMENT:    1. Pericardial effusion   2. Panlobular emphysema (HCC)   3. Mixed dyslipidemia   4. Coronary artery calcification    PLAN:    In order of problems listed above:  Coronary artery calcification: Secondary prevention stressed with the patient.  Importance of compliance with diet medication stressed and she vocalized understanding.  She was advised to ambulate on a regular basis.  She is recovering from pneumonitis. Pericardial effusion: By history.  Will do an echocardiogram to follow-up on this. Mixed dyslipidemia: On lipid-lowering medications followed by primary care.  She is going to get blood work done in the next few days and send [place] a copy.  Goal LDL less than 60. COPD: Post pneumonitis: Followed by primary care.  She just finished a course of antibiotics and feeling better. Patient will be seen in follow-up appointment in 6 months or earlier if the patient has any concerns.    Medication Adjustments/Labs and Tests Ordered: Current medicines are reviewed at length with the patient today.  Concerns regarding medicines are outlined above.  Orders Placed This Encounter  Procedures   EKG 12-Lead   No orders of the defined types were placed in this encounter.    No chief complaint on file.    History of Present Illness:    Pam Gamble is a 77 y.o. female.  Patient has past medical history of coronary artery calcifications, aortic atherosclerosis, pericardial effusion, essential hypertension and diabetes mellitus.  She denies any problems at this time and takes care of activities of daily living.  No chest pain orthopnea or PND.  At the time of my evaluation, the patient is alert awake oriented and in no distress.  Past Medical History:   Diagnosis Date   Acute biliary pancreatitis 10/01/2018   Acute cholangitis 10/01/2018   Acute non-recurrent maxillary sinusitis 01/12/2011   Qualifier: Diagnosis of  By: [name], [name]     Acute on chronic renal failure (HCC) 03/15/2022   Allergic rhinitis 01/08/2008   Chronic     Atrophic vaginitis 05/03/2015   6/16    Barrett's esophagus    C O P D WITH ACUTE EXACERBATION 03/21/2010   9/13, 11/14, 8/18  [name], [name].  Potential benefits of a short/long term steroid  use as well as potential risks  and complications were explained to the patient and were aknowledged.      Calcium deposits in tendon and bursa    CAROTID BRUIT 02/09/2008   Qualifier: Diagnosis of  By: Plotnikov MD, Georgina Quint    Chest discomfort 06/22/2019   COPD (chronic obstructive pulmonary disease) (HCC)    COPD, frequent exacerbations (HCC) 09/15/2007   [name] Chronic, severe Advair Albuterol HHN prn Steroids - frequent use  Pt sleeps in a recliner since 2012    Cough 05/23/2012   Depression    Diabetes mellitus due to underlying condition with unspecified complications (HCC) 06/22/2019   Diabetes type 2, uncontrolled 12/27/2013   2/15 2/17 Levemir - too $$$. Started Toujeo (10/17) samples Continue with current prescription therapy as reflected on the Med list - Levemir. Risks associated with treatment noncompliance were discussed. Compliance was encouraged. 2018 Metformin and NPH insulin 5/18 Use NPH insulin 40 u in  am and 10 u at night. Titrate up by 1 unit a day each (AM and HS doses) for goal sugars of 100-130 up to 60    Dyspnea on exertion 06/22/2019   Edema 09/09/2014   10/15 B LE    Epigastric pain 07/12/2014   8/15 lower R>L ? etiol - r/o pyelo and others   Esophageal reflux    Ex-smoker 06/22/2019   Fever, unspecified 07/12/2014   8/15 mild T99F    Hyperkalemia    HYSTERECTOMY, PARTIAL, HX OF 09/26/2007   Qualifier: Diagnosis of   By: [name], [name]      Replacing diagnoses that  were inactivated after the 02/10/23 regulatory import   Idiopathic thrombocythemia (HCC)    Kidney stone 07/27/2014   2014 [name]   Kidney stones    LATERAL EPICONDYLITIS 09/26/2007   Qualifier: History of   By: [name], [name]      Replacing diagnoses that were inactivated after the 02/10/23 regulatory import   Leg cramp 04/15/2014    Albuterol can give cramps    Low back pain 04/25/2017   Spinal stenosis   Major depressive disorder, recurrent episode (HCC) 06/14/2009   Chronic      Mixed dyslipidemia 03/28/2020   Noncompliance 09/10/2016   Recurrent    Pericardial effusion 06/22/2019   Personal history of malignant neoplasm of other parts of uterus    Personal history of other diseases of digestive system    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   Remote  Levaquin Rx Vaccines    Pyelonephritis, acute 02/26/2011   2012, 2015 L    Renal failure 03/16/2022   Rotator cuff arthropathy 03/21/2015   Injection 03/21/2015    Shortness of breath    upon exertion   Tick bite 01/18/2017   TOBACCO USE, QUIT 01/10/2010   Qualifier: Diagnosis of  By: [name]      Past Surgical History:  Procedure Laterality Date   ABDOMINAL HYSTERECTOMY     CATARACT EXTRACTION Bilateral 01/2016 and 03/2016   CHOLECYSTECTOMY N/A 10/03/2018   Procedure: LAPAROSCOPIC CHOLECYSTECTOMY WITH INTRAOPERATIVE CHOLANGIOGRAM;  Surgeon: [name], MD;  Location: [facility];  Service: General;  Laterality: N/A;   LITHOTRIPSY     REFRACTIVE SURGERY Right 05/16/2016   SPLENECTOMY  1999   TONSILLECTOMY AND ADENOIDECTOMY      Current Medications: Current Meds  Medication Sig   albuterol (PROVENTIL) (2.5 MG/3ML) 0.083% nebulizer solution Take 2.5 mg by nebulization every 8 (eight) hours.   aspirin EC 81 MG tablet Take 81 mg by mouth daily.   Azelastine HCl 137 MCG/SPRAY SOLN Place 2 sprays into both nostrils 2 (two) times daily.   Cholecalciferol (VITAMIN D3) 25 MCG (1000 UT) CAPS Take 1,000 Units by mouth daily.    Cyanocobalamin (B-12 PO) Take 1 tablet by mouth daily.   Fluticasone-Umeclidin-Vilant (TRELEGY ELLIPTA) 100-62.5-25 MCG/INH AEPB Inhale 1 puff into the lungs daily.   levocetirizine (XYZAL) 5 MG tablet Take 5 mg by mouth at bedtime.   montelukast (SINGULAIR) 10 MG tablet Take 10 mg by mouth daily.   omeprazole (PRILOSEC) 40 MG capsule Take 40 mg by mouth 2 (two) times daily.   ondansetron (ZOFRAN-ODT) 4 MG disintegrating tablet Take 4 mg by mouth as needed for nausea or vomiting.   OZEMPIC, 1 MG/DOSE, 4 MG/3ML SOPN Inject 1 mg into the skin once a week.   rOPINIRole (REQUIP) 2 MG tablet Take 2 mg by mouth at bedtime.   rosuvastatin (CRESTOR) 20 MG tablet Take 20 mg  by mouth at bedtime.   simethicone (MYLICON) 80 MG chewable tablet Chew 1 tablet (80 mg total) by mouth 4 (four) times daily as needed for flatulence (dyspepsia, indigestion).   torsemide (DEMADEX) 10 MG tablet Take 10 mg by mouth as needed (fluid retention).   TRESIBA FLEXTOUCH 100 UNIT/ML FlexTouch Pen Inject 30 Units into the skin daily.   vitamin C (ASCORBIC ACID) 500 MG tablet Take 500 mg by mouth daily.     Allergies:   Amoxicillin-pot clavulanate, Levaquin [levofloxacin], and Ertugliflozin   Social History   Socioeconomic History   Marital status: Single    Spouse name: Not on file   Number of children: 0   Years of education: Not on file   Highest education level: Not on file  Occupational History   Occupation: retired    Associate Professor: [employer]  Tobacco Use   Smoking status: Former    Current packs/day: 0.00    Average packs/day: 2.0 packs/day for 49.0 years (98.0 ttl pk-yrs)    Types: Cigarettes    Start date: 11/11/1956    Quit date: 11/11/2005    Years since quitting: 17.8   Smokeless tobacco: Never  Vaping Use   Vaping status: Never Used  Substance and Sexual Activity   Alcohol use: Yes    Alcohol/week: 0.0 standard drinks of alcohol    Comment: beer, whiskey, margherita   Drug use: No   Sexual  activity: Not Currently  Other Topics Concern   Not on file  Social History Narrative   Retired - Futures trader   Lives alone   Single   Social Determinants of Health   Financial Resource Strain: Not on file  Food Insecurity: Not on file  Transportation Needs: Not on file  Physical Activity: Not on file  Stress: Not on file  Social Connections: Not on file     Family History: The patient's family history includes Diabetes in her father, maternal aunt, and sister; Heart disease in her father and mother; Liver cancer in her sister; Pancreatic cancer in her sister; Stroke in her mother.  ROS:   Please see the history of present illness.    All other systems reviewed and are negative.  EKGs/Labs/Other Studies Reviewed:    The following studies were reviewed today: .Marland KitchenEKG Interpretation Date/Time:  Tuesday September 09 2023 16:30:19 EDT Ventricular Rate:  70 PR Interval:  126 QRS Duration:  78 QT Interval:  410 QTC Calculation: 442 R Axis:   48  Text Interpretation: Normal sinus rhythm Nonspecific T wave abnormality Abnormal ECG When compared with ECG of 15-Mar-2022 18:15, PREVIOUS ECG IS PRESENT Confirmed by Belva Crome [phone]) on 09/09/2023 4:39:07 PM     Recent Labs: No results found for requested labs within last 365 days.  Recent Lipid Panel    Component Value Date/Time   CHOL 228 (H) 12/12/2016 0853   TRIG 114.0 12/12/2016 0853   HDL 52.60 12/12/2016 0853   CHOLHDL 4 12/12/2016 0853   VLDL 22.8 12/12/2016 0853   LDLCALC 153 (H) 12/12/2016 0853   LDLDIRECT 160.1 07/12/2014 1423    Physical Exam:    VS:  BP 124/60   Pulse 70   Ht 5\' 2"  (1.575 m)   Wt 160 lb (72.6 kg)   SpO2 95%   BMI 29.26 kg/m     Wt Readings from Last 3 Encounters:  09/09/23 160 lb (72.6 kg)  08/07/23 158 lb (71.7 kg)  03/16/22 159 lb 13.3 oz (72.5 kg)  GEN: Patient is in no acute distress HEENT: Normal NECK: No JVD; No carotid bruits LYMPHATICS: No  lymphadenopathy CARDIAC: Hear sounds regular, 2/6 systolic murmur at the apex. RESPIRATORY:  Clear to auscultation without rales, wheezing or rhonchi  ABDOMEN: Soft, non-tender, non-distended MUSCULOSKELETAL:  No edema; No deformity  SKIN: Warm and dry NEUROLOGIC:  Alert and oriented x 3 PSYCHIATRIC:  Normal affect   Signed, Garwin Brothers, MD  09/09/2023 4:38 PM    [facility]

## 2023-09-11 DIAGNOSIS — I700 Atherosclerosis of aorta: Secondary | ICD-10-CM | POA: Diagnosis not present

## 2023-09-11 DIAGNOSIS — B3731 Acute candidiasis of vulva and vagina: Secondary | ICD-10-CM | POA: Diagnosis not present

## 2023-09-11 DIAGNOSIS — E119 Type 2 diabetes mellitus without complications: Secondary | ICD-10-CM | POA: Diagnosis not present

## 2023-09-11 DIAGNOSIS — G2581 Restless legs syndrome: Secondary | ICD-10-CM | POA: Diagnosis not present

## 2023-09-11 DIAGNOSIS — J441 Chronic obstructive pulmonary disease with (acute) exacerbation: Secondary | ICD-10-CM | POA: Diagnosis not present

## 2023-09-11 DIAGNOSIS — Z794 Long term (current) use of insulin: Secondary | ICD-10-CM | POA: Diagnosis not present

## 2023-09-11 DIAGNOSIS — G253 Myoclonus: Secondary | ICD-10-CM | POA: Diagnosis not present

## 2023-09-11 DIAGNOSIS — Z6828 Body mass index (BMI) 28.0-28.9, adult: Secondary | ICD-10-CM | POA: Diagnosis not present

## 2023-10-15 ENCOUNTER — Ambulatory Visit: Payer: PPO | Attending: Cardiology

## 2023-10-15 DIAGNOSIS — I3139 Other pericardial effusion (noninflammatory): Secondary | ICD-10-CM

## 2023-10-17 LAB — ECHOCARDIOGRAM COMPLETE
Area-P 1/2: 3.59 cm2
S' Lateral: 2.6 cm

## 2023-12-10 DIAGNOSIS — G253 Myoclonus: Secondary | ICD-10-CM | POA: Diagnosis not present

## 2023-12-10 DIAGNOSIS — J441 Chronic obstructive pulmonary disease with (acute) exacerbation: Secondary | ICD-10-CM | POA: Diagnosis not present

## 2023-12-10 DIAGNOSIS — G2581 Restless legs syndrome: Secondary | ICD-10-CM | POA: Diagnosis not present

## 2023-12-10 DIAGNOSIS — Z6828 Body mass index (BMI) 28.0-28.9, adult: Secondary | ICD-10-CM | POA: Diagnosis not present

## 2024-01-05 ENCOUNTER — Telehealth: Payer: Self-pay

## 2024-01-05 NOTE — Progress Notes (Signed)
   01/05/2024  Patient ID: Pam Gamble, female   DOB: 05/03/1946, 78 y.o.   MRN: [id number]  Attempted to contact patient for scheduled appointment for medication assistance. Left HIPAA compliant message for patient to return my call at their convenience.   Harlon Flor, PharmD Clinical Pharmacist  [phone]

## 2024-01-07 NOTE — Progress Notes (Signed)
   01/07/2024  Patient ID: Pam Gamble, female   DOB: 05-01-1946, 78 y.o.   MRN: [id number]  Attempted to contact patient for scheduled appointment for medication assistance. Left HIPAA compliant message for patient to return my call at their convenience.   Harlon Flor, PharmD Clinical Pharmacist  [phone]

## 2024-01-22 DIAGNOSIS — E1169 Type 2 diabetes mellitus with other specified complication: Secondary | ICD-10-CM | POA: Diagnosis not present

## 2024-01-22 DIAGNOSIS — Z6828 Body mass index (BMI) 28.0-28.9, adult: Secondary | ICD-10-CM | POA: Diagnosis not present

## 2024-01-22 DIAGNOSIS — Z1339 Encounter for screening examination for other mental health and behavioral disorders: Secondary | ICD-10-CM | POA: Diagnosis not present

## 2024-01-22 DIAGNOSIS — E785 Hyperlipidemia, unspecified: Secondary | ICD-10-CM | POA: Diagnosis not present

## 2024-01-22 DIAGNOSIS — I700 Atherosclerosis of aorta: Secondary | ICD-10-CM | POA: Diagnosis not present

## 2024-01-22 DIAGNOSIS — Z794 Long term (current) use of insulin: Secondary | ICD-10-CM | POA: Diagnosis not present

## 2024-01-22 DIAGNOSIS — J439 Emphysema, unspecified: Secondary | ICD-10-CM | POA: Diagnosis not present

## 2024-01-22 DIAGNOSIS — Z1331 Encounter for screening for depression: Secondary | ICD-10-CM | POA: Diagnosis not present

## 2024-01-22 DIAGNOSIS — I5030 Unspecified diastolic (congestive) heart failure: Secondary | ICD-10-CM | POA: Diagnosis not present

## 2024-01-22 DIAGNOSIS — N1832 Chronic kidney disease, stage 3b: Secondary | ICD-10-CM | POA: Diagnosis not present

## 2024-01-22 DIAGNOSIS — E119 Type 2 diabetes mellitus without complications: Secondary | ICD-10-CM | POA: Diagnosis not present

## 2024-02-04 DIAGNOSIS — N289 Disorder of kidney and ureter, unspecified: Secondary | ICD-10-CM | POA: Diagnosis not present

## 2024-02-19 ENCOUNTER — Other Ambulatory Visit (HOSPITAL_COMMUNITY): Payer: Self-pay

## 2024-02-19 ENCOUNTER — Telehealth: Payer: Self-pay

## 2024-02-19 NOTE — Telephone Encounter (Signed)
 Pharmacy Patient Advocate Encounter  Insurance verification completed.   The patient is insured through [facility] ADVANTAGE/RX ADVANCE   Ran test claim for Ozempic. Currently a quantity of 3ml is a 28 day supply and the co-pay is requires PA .   This test claim was processed through [facility]- copay amounts may vary at other pharmacies due to pharmacy/plan contracts, or as the patient moves through the different stages of their insurance plan.

## 2024-02-20 ENCOUNTER — Telehealth: Payer: Self-pay | Admitting: Pharmacist

## 2024-02-20 NOTE — Progress Notes (Signed)
   02/20/2024  Patient ID: Pam Gamble, female   DOB: 1946-07-13, 78 y.o.   MRN: [id number]  Telephonic engagement with Pam Gamble today regarding her patient assistance reenrollment for Ozempic and [place]. Patient has the [employer] plan for 2025 and co-pays for her antidiabetic medications will be covered at $0. Patient verbalized understanding and would like Ozempic sent to [facility] in [place]. I have made patient aware that a PA will be required prior to the medication being ready for pick up.  I have sent a follow up message to RN for PCP and explained process.   Reynold Bowen, PharmD Clinical Pharmacist [place] Direct Dial: [phone]

## 2024-02-25 ENCOUNTER — Telehealth: Payer: Self-pay | Admitting: Pharmacist

## 2024-02-25 NOTE — Progress Notes (Signed)
   02/25/2024  Patient ID: Pam Gamble, female   DOB: 05/29/1946, 78 y.o.   MRN: [id number]  Telephonic engagement with Pam Gamble today to follow up on prescription for Ozempic. Confirmed with [facility] earlier this week that PA processed and co-pay would be $0. Patient states she is planning to pick the medication up today. Provided patient with my direct contact should she have any issues in the future. Patient appreciative of my call.   Calvert Caul, PharmD Clinical Pharmacist [place] Direct Dial: [phone]'

## 2024-03-16 DIAGNOSIS — R509 Fever, unspecified: Secondary | ICD-10-CM | POA: Diagnosis not present

## 2024-03-16 DIAGNOSIS — E119 Type 2 diabetes mellitus without complications: Secondary | ICD-10-CM | POA: Diagnosis not present

## 2024-03-16 DIAGNOSIS — R070 Pain in throat: Secondary | ICD-10-CM | POA: Diagnosis not present

## 2024-03-16 DIAGNOSIS — Z794 Long term (current) use of insulin: Secondary | ICD-10-CM | POA: Diagnosis not present

## 2024-03-16 DIAGNOSIS — J441 Chronic obstructive pulmonary disease with (acute) exacerbation: Secondary | ICD-10-CM | POA: Diagnosis not present

## 2024-03-16 DIAGNOSIS — J189 Pneumonia, unspecified organism: Secondary | ICD-10-CM | POA: Diagnosis not present

## 2024-03-18 DIAGNOSIS — R918 Other nonspecific abnormal finding of lung field: Secondary | ICD-10-CM | POA: Diagnosis not present

## 2024-03-18 DIAGNOSIS — J189 Pneumonia, unspecified organism: Secondary | ICD-10-CM | POA: Diagnosis not present

## 2024-03-18 DIAGNOSIS — Z6827 Body mass index (BMI) 27.0-27.9, adult: Secondary | ICD-10-CM | POA: Diagnosis not present

## 2024-03-19 ENCOUNTER — Telehealth: Payer: Self-pay | Admitting: Pharmacist

## 2024-03-19 NOTE — Progress Notes (Signed)
   03/19/2024  Patient ID: Pam Gamble, female   DOB: 1946/10/16, 78 y.o.   MRN: [id number]  Telephonic engagement with [name] today. Notified patient that Freestyle Libre sensors were sent to [employer]. Confirmed with [facility], cost is $0 (yesterday). Patient confirms today that she has already picked the sensors up along with additional medications she needed. No needs or concerns at this time.   Calvert Caul, PharmD Clinical Pharmacist [place] Direct Dial: [phone]

## 2024-04-13 DIAGNOSIS — H52223 Regular astigmatism, bilateral: Secondary | ICD-10-CM | POA: Diagnosis not present

## 2024-04-13 DIAGNOSIS — H18513 Endothelial corneal dystrophy, bilateral: Secondary | ICD-10-CM | POA: Diagnosis not present

## 2024-04-13 DIAGNOSIS — D3131 Benign neoplasm of right choroid: Secondary | ICD-10-CM | POA: Diagnosis not present

## 2024-04-13 DIAGNOSIS — H524 Presbyopia: Secondary | ICD-10-CM | POA: Diagnosis not present

## 2024-04-13 DIAGNOSIS — E119 Type 2 diabetes mellitus without complications: Secondary | ICD-10-CM | POA: Diagnosis not present

## 2024-04-13 DIAGNOSIS — H26492 Other secondary cataract, left eye: Secondary | ICD-10-CM | POA: Diagnosis not present

## 2024-04-15 DIAGNOSIS — R918 Other nonspecific abnormal finding of lung field: Secondary | ICD-10-CM | POA: Diagnosis not present

## 2024-04-15 DIAGNOSIS — J441 Chronic obstructive pulmonary disease with (acute) exacerbation: Secondary | ICD-10-CM | POA: Diagnosis not present

## 2024-04-23 DIAGNOSIS — R918 Other nonspecific abnormal finding of lung field: Secondary | ICD-10-CM | POA: Diagnosis not present

## 2024-04-24 DIAGNOSIS — J439 Emphysema, unspecified: Secondary | ICD-10-CM | POA: Diagnosis not present

## 2024-04-24 DIAGNOSIS — R918 Other nonspecific abnormal finding of lung field: Secondary | ICD-10-CM | POA: Diagnosis not present

## 2024-04-26 DIAGNOSIS — J159 Unspecified bacterial pneumonia: Secondary | ICD-10-CM | POA: Diagnosis not present

## 2024-04-28 DIAGNOSIS — J449 Chronic obstructive pulmonary disease, unspecified: Secondary | ICD-10-CM | POA: Diagnosis not present

## 2024-04-28 DIAGNOSIS — G4733 Obstructive sleep apnea (adult) (pediatric): Secondary | ICD-10-CM | POA: Diagnosis not present

## 2024-04-28 DIAGNOSIS — R918 Other nonspecific abnormal finding of lung field: Secondary | ICD-10-CM | POA: Diagnosis not present

## 2024-05-19 DIAGNOSIS — E119 Type 2 diabetes mellitus without complications: Secondary | ICD-10-CM | POA: Diagnosis not present

## 2024-05-19 DIAGNOSIS — R0609 Other forms of dyspnea: Secondary | ICD-10-CM | POA: Diagnosis not present

## 2024-05-19 DIAGNOSIS — M7989 Other specified soft tissue disorders: Secondary | ICD-10-CM | POA: Diagnosis not present

## 2024-05-19 DIAGNOSIS — I5030 Unspecified diastolic (congestive) heart failure: Secondary | ICD-10-CM | POA: Diagnosis not present

## 2024-05-19 DIAGNOSIS — Z794 Long term (current) use of insulin: Secondary | ICD-10-CM | POA: Diagnosis not present

## 2024-05-19 DIAGNOSIS — R2243 Localized swelling, mass and lump, lower limb, bilateral: Secondary | ICD-10-CM | POA: Diagnosis not present

## 2024-05-19 DIAGNOSIS — Z6829 Body mass index (BMI) 29.0-29.9, adult: Secondary | ICD-10-CM | POA: Diagnosis not present

## 2024-05-19 DIAGNOSIS — M79605 Pain in left leg: Secondary | ICD-10-CM | POA: Diagnosis not present

## 2024-05-28 DIAGNOSIS — Z0000 Encounter for general adult medical examination without abnormal findings: Secondary | ICD-10-CM | POA: Diagnosis not present

## 2024-05-28 DIAGNOSIS — E119 Type 2 diabetes mellitus without complications: Secondary | ICD-10-CM | POA: Diagnosis not present

## 2024-05-28 DIAGNOSIS — Z13820 Encounter for screening for osteoporosis: Secondary | ICD-10-CM | POA: Diagnosis not present

## 2024-05-28 DIAGNOSIS — E785 Hyperlipidemia, unspecified: Secondary | ICD-10-CM | POA: Diagnosis not present

## 2024-05-28 DIAGNOSIS — I5030 Unspecified diastolic (congestive) heart failure: Secondary | ICD-10-CM | POA: Diagnosis not present

## 2024-05-28 DIAGNOSIS — Z794 Long term (current) use of insulin: Secondary | ICD-10-CM | POA: Diagnosis not present

## 2024-05-28 DIAGNOSIS — N1832 Chronic kidney disease, stage 3b: Secondary | ICD-10-CM | POA: Diagnosis not present

## 2024-05-28 DIAGNOSIS — J441 Chronic obstructive pulmonary disease with (acute) exacerbation: Secondary | ICD-10-CM | POA: Diagnosis not present

## 2024-05-28 DIAGNOSIS — J439 Emphysema, unspecified: Secondary | ICD-10-CM | POA: Diagnosis not present

## 2024-05-28 DIAGNOSIS — Z1339 Encounter for screening examination for other mental health and behavioral disorders: Secondary | ICD-10-CM | POA: Diagnosis not present

## 2024-05-28 DIAGNOSIS — Z6829 Body mass index (BMI) 29.0-29.9, adult: Secondary | ICD-10-CM | POA: Diagnosis not present

## 2024-05-28 DIAGNOSIS — E1169 Type 2 diabetes mellitus with other specified complication: Secondary | ICD-10-CM | POA: Diagnosis not present

## 2024-06-02 DIAGNOSIS — J301 Allergic rhinitis due to pollen: Secondary | ICD-10-CM | POA: Diagnosis not present

## 2024-06-02 DIAGNOSIS — J449 Chronic obstructive pulmonary disease, unspecified: Secondary | ICD-10-CM | POA: Diagnosis not present

## 2024-06-02 DIAGNOSIS — G4761 Periodic limb movement disorder: Secondary | ICD-10-CM | POA: Diagnosis not present

## 2024-06-02 DIAGNOSIS — J189 Pneumonia, unspecified organism: Secondary | ICD-10-CM | POA: Diagnosis not present

## 2024-06-02 DIAGNOSIS — N1832 Chronic kidney disease, stage 3b: Secondary | ICD-10-CM | POA: Diagnosis not present

## 2024-06-04 ENCOUNTER — Telehealth: Payer: Self-pay | Admitting: Pharmacist

## 2024-06-04 NOTE — Progress Notes (Signed)
 Attempted to contact patient for medication management. Left HIPAA compliant message for patient to return my call at their convenience.    Reynold Bowen, PharmD Clinical Pharmacist [place] Direct Dial: [phone]

## 2024-06-11 ENCOUNTER — Telehealth: Payer: Self-pay | Admitting: Pharmacist

## 2024-06-11 DIAGNOSIS — N200 Calculus of kidney: Secondary | ICD-10-CM | POA: Diagnosis not present

## 2024-06-11 DIAGNOSIS — E1129 Type 2 diabetes mellitus with other diabetic kidney complication: Secondary | ICD-10-CM | POA: Diagnosis not present

## 2024-06-11 DIAGNOSIS — E1122 Type 2 diabetes mellitus with diabetic chronic kidney disease: Secondary | ICD-10-CM | POA: Diagnosis not present

## 2024-06-11 DIAGNOSIS — R809 Proteinuria, unspecified: Secondary | ICD-10-CM | POA: Diagnosis not present

## 2024-06-11 DIAGNOSIS — I5032 Chronic diastolic (congestive) heart failure: Secondary | ICD-10-CM | POA: Diagnosis not present

## 2024-06-11 DIAGNOSIS — N1832 Chronic kidney disease, stage 3b: Secondary | ICD-10-CM | POA: Diagnosis not present

## 2024-06-11 DIAGNOSIS — K2270 Barrett's esophagus without dysplasia: Secondary | ICD-10-CM | POA: Diagnosis not present

## 2024-06-11 NOTE — Progress Notes (Signed)
   06/11/2024  Patient ID: Pam Gamble, female   DOB: 06/30/1946, 78 y.o.   MRN: [id number]  Diabetes Assessment  Last A1C: 9.7% on 05/28/2024 Up from 8.8% on 01/22/2024 Last GFR: 44 ml/min w/ MACR 528 mg/g on 06/03/2024, Up from 39 mg/g on 01/22/2024  Goal A1C: <7%   Patient reports recent treatment with oral prednisone . Discussed with patient option to increase Ozempic 1 mg subcutaneous weekly to Ozempic 2 mg subcutaneous weekly. Patient to start Farxiga 10 mg 1 tablet PO daily, on her way to pick up at the pharmacy today. Following discussion with patient she will remain on Ozempic 1 mg subcutaneously weekly and monitor BG with addition of Farxiga. Patient reports just picking up a supply of Ozempic, states she does not need any at this time. Sent 90 day supply of Ozempic to [facility] for file for future for improved adherence. Farxiga 10 mg 1 tab PO daily was also sent in recently for #90 day supply. Patient has [employer] CS[employer] plan that covers antidiabetes medications at $0.    Annabella Galla, PharmD Clinical Pharmacist [place] Direct Dial: [phone]

## 2024-06-11 NOTE — Progress Notes (Signed)
 Attempted to contact patient for  medication management re Ozempic and Farxiga. Left HIPAA compliant message for patient to return my call at their convenience.   Annabella Galla, PharmD Clinical Pharmacist [place] Direct Dial: [phone]

## 2024-06-30 DIAGNOSIS — Z9181 History of falling: Secondary | ICD-10-CM | POA: Diagnosis not present

## 2024-06-30 DIAGNOSIS — S80862A Insect bite (nonvenomous), left lower leg, initial encounter: Secondary | ICD-10-CM | POA: Diagnosis not present

## 2024-06-30 DIAGNOSIS — N200 Calculus of kidney: Secondary | ICD-10-CM | POA: Diagnosis not present

## 2024-06-30 DIAGNOSIS — E119 Type 2 diabetes mellitus without complications: Secondary | ICD-10-CM | POA: Diagnosis not present

## 2024-06-30 DIAGNOSIS — J439 Emphysema, unspecified: Secondary | ICD-10-CM | POA: Diagnosis not present

## 2024-06-30 DIAGNOSIS — N1832 Chronic kidney disease, stage 3b: Secondary | ICD-10-CM | POA: Diagnosis not present

## 2024-06-30 DIAGNOSIS — R109 Unspecified abdominal pain: Secondary | ICD-10-CM | POA: Diagnosis not present

## 2024-06-30 DIAGNOSIS — Z794 Long term (current) use of insulin: Secondary | ICD-10-CM | POA: Diagnosis not present

## 2024-06-30 DIAGNOSIS — Z6828 Body mass index (BMI) 28.0-28.9, adult: Secondary | ICD-10-CM | POA: Diagnosis not present

## 2024-06-30 DIAGNOSIS — W57XXXA Bitten or stung by nonvenomous insect and other nonvenomous arthropods, initial encounter: Secondary | ICD-10-CM | POA: Diagnosis not present

## 2024-07-29 DIAGNOSIS — R918 Other nonspecific abnormal finding of lung field: Secondary | ICD-10-CM | POA: Diagnosis not present

## 2024-07-29 DIAGNOSIS — J439 Emphysema, unspecified: Secondary | ICD-10-CM | POA: Diagnosis not present

## 2024-08-02 ENCOUNTER — Encounter (HOSPITAL_COMMUNITY): Payer: Self-pay | Admitting: Emergency Medicine

## 2024-08-02 ENCOUNTER — Inpatient Hospital Stay (HOSPITAL_COMMUNITY)
Admission: EM | Admit: 2024-08-02 | Discharge: 2024-08-04 | DRG: 690 | Disposition: A | Discharge status: Home / Self Care | Source: Ambulatory Visit | Attending: Internal Medicine | Admitting: Internal Medicine

## 2024-08-02 ENCOUNTER — Emergency Department (HOSPITAL_COMMUNITY)

## 2024-08-02 ENCOUNTER — Other Ambulatory Visit: Payer: Self-pay

## 2024-08-02 DIAGNOSIS — N200 Calculus of kidney: Secondary | ICD-10-CM | POA: Diagnosis not present

## 2024-08-02 DIAGNOSIS — Z888 Allergy status to other drugs, medicaments and biological substances status: Secondary | ICD-10-CM

## 2024-08-02 DIAGNOSIS — E782 Mixed hyperlipidemia: Secondary | ICD-10-CM | POA: Diagnosis present

## 2024-08-02 DIAGNOSIS — Z90711 Acquired absence of uterus with remaining cervical stump: Secondary | ICD-10-CM

## 2024-08-02 DIAGNOSIS — N3001 Acute cystitis with hematuria: Secondary | ICD-10-CM | POA: Diagnosis not present

## 2024-08-02 DIAGNOSIS — R059 Cough, unspecified: Secondary | ICD-10-CM | POA: Diagnosis not present

## 2024-08-02 DIAGNOSIS — J449 Chronic obstructive pulmonary disease, unspecified: Secondary | ICD-10-CM | POA: Diagnosis present

## 2024-08-02 DIAGNOSIS — G2581 Restless legs syndrome: Secondary | ICD-10-CM | POA: Diagnosis present

## 2024-08-02 DIAGNOSIS — Z794 Long term (current) use of insulin: Secondary | ICD-10-CM

## 2024-08-02 DIAGNOSIS — K219 Gastro-esophageal reflux disease without esophagitis: Secondary | ICD-10-CM | POA: Diagnosis present

## 2024-08-02 DIAGNOSIS — Z9842 Cataract extraction status, left eye: Secondary | ICD-10-CM

## 2024-08-02 DIAGNOSIS — Z9841 Cataract extraction status, right eye: Secondary | ICD-10-CM

## 2024-08-02 DIAGNOSIS — N12 Tubulo-interstitial nephritis, not specified as acute or chronic: Secondary | ICD-10-CM | POA: Diagnosis not present

## 2024-08-02 DIAGNOSIS — Z823 Family history of stroke: Secondary | ICD-10-CM

## 2024-08-02 DIAGNOSIS — Z7982 Long term (current) use of aspirin: Secondary | ICD-10-CM

## 2024-08-02 DIAGNOSIS — Z7985 Long-term (current) use of injectable non-insulin antidiabetic drugs: Secondary | ICD-10-CM

## 2024-08-02 DIAGNOSIS — N10 Acute tubulo-interstitial nephritis: Principal | ICD-10-CM | POA: Diagnosis present

## 2024-08-02 DIAGNOSIS — Z833 Family history of diabetes mellitus: Secondary | ICD-10-CM

## 2024-08-02 DIAGNOSIS — Z8249 Family history of ischemic heart disease and other diseases of the circulatory system: Secondary | ICD-10-CM

## 2024-08-02 DIAGNOSIS — Z87891 Personal history of nicotine dependence: Secondary | ICD-10-CM

## 2024-08-02 DIAGNOSIS — R109 Unspecified abdominal pain: Secondary | ICD-10-CM | POA: Diagnosis not present

## 2024-08-02 DIAGNOSIS — Z8542 Personal history of malignant neoplasm of other parts of uterus: Secondary | ICD-10-CM

## 2024-08-02 DIAGNOSIS — N1831 Chronic kidney disease, stage 3a: Secondary | ICD-10-CM | POA: Diagnosis present

## 2024-08-02 DIAGNOSIS — Z7951 Long term (current) use of inhaled steroids: Secondary | ICD-10-CM

## 2024-08-02 DIAGNOSIS — D631 Anemia in chronic kidney disease: Secondary | ICD-10-CM | POA: Diagnosis present

## 2024-08-02 DIAGNOSIS — Z800 Family history of malignant neoplasm of digestive organs: Secondary | ICD-10-CM

## 2024-08-02 DIAGNOSIS — Z66 Do not resuscitate: Secondary | ICD-10-CM | POA: Diagnosis present

## 2024-08-02 DIAGNOSIS — I5032 Chronic diastolic (congestive) heart failure: Secondary | ICD-10-CM | POA: Diagnosis present

## 2024-08-02 DIAGNOSIS — Z8589 Personal history of malignant neoplasm of other organs and systems: Secondary | ICD-10-CM

## 2024-08-02 DIAGNOSIS — Z79899 Other long term (current) drug therapy: Secondary | ICD-10-CM

## 2024-08-02 DIAGNOSIS — Z87442 Personal history of urinary calculi: Secondary | ICD-10-CM

## 2024-08-02 DIAGNOSIS — I517 Cardiomegaly: Secondary | ICD-10-CM | POA: Diagnosis not present

## 2024-08-02 DIAGNOSIS — K429 Umbilical hernia without obstruction or gangrene: Secondary | ICD-10-CM | POA: Diagnosis not present

## 2024-08-02 DIAGNOSIS — I700 Atherosclerosis of aorta: Secondary | ICD-10-CM | POA: Diagnosis not present

## 2024-08-02 DIAGNOSIS — Z880 Allergy status to penicillin: Secondary | ICD-10-CM

## 2024-08-02 DIAGNOSIS — Z9081 Acquired absence of spleen: Secondary | ICD-10-CM

## 2024-08-02 DIAGNOSIS — E119 Type 2 diabetes mellitus without complications: Secondary | ICD-10-CM | POA: Diagnosis present

## 2024-08-02 DIAGNOSIS — E088 Diabetes mellitus due to underlying condition with unspecified complications: Secondary | ICD-10-CM | POA: Diagnosis present

## 2024-08-02 HISTORY — DX: Tubulo-interstitial nephritis, not specified as acute or chronic: N12

## 2024-08-02 LAB — CBC WITH DIFFERENTIAL/PLATELET
Abs Immature Granulocytes: 0.07 K/uL (ref 0.00–0.07)
Basophils Absolute: 0.1 K/uL (ref 0.0–0.1)
Basophils Relative: 0 %
Eosinophils Absolute: 0.1 K/uL (ref 0.0–0.5)
Eosinophils Relative: 1 %
HCT: 39.6 % (ref 36.0–46.0)
Hemoglobin: 11.8 g/dL — ABNORMAL LOW (ref 12.0–15.0)
Immature Granulocytes: 1 %
Lymphocytes Relative: 29 %
Lymphs Abs: 4.2 K/uL — ABNORMAL HIGH (ref 0.7–4.0)
MCH: 29 pg (ref 26.0–34.0)
MCHC: 29.8 g/dL — ABNORMAL LOW (ref 30.0–36.0)
MCV: 97.3 fL (ref 80.0–100.0)
Monocytes Absolute: 1.4 K/uL — ABNORMAL HIGH (ref 0.1–1.0)
Monocytes Relative: 10 %
Neutro Abs: 8.7 K/uL — ABNORMAL HIGH (ref 1.7–7.7)
Neutrophils Relative %: 59 %
Platelets: 359 K/uL (ref 150–400)
RBC: 4.07 MIL/uL (ref 3.87–5.11)
RDW: 13.9 % (ref 11.5–15.5)
WBC: 14.6 K/uL — ABNORMAL HIGH (ref 4.0–10.5)
nRBC: 0 % (ref 0.0–0.2)

## 2024-08-02 LAB — COMPREHENSIVE METABOLIC PANEL WITH GFR
ALT: 11 U/L (ref 0–44)
AST: 17 U/L (ref 15–41)
Albumin: 3.6 g/dL (ref 3.5–5.0)
Alkaline Phosphatase: 70 U/L (ref 38–126)
Anion gap: 12 (ref 5–15)
BUN: 15 mg/dL (ref 8–23)
CO2: 22 mmol/L (ref 22–32)
Calcium: 8.8 mg/dL — ABNORMAL LOW (ref 8.9–10.3)
Chloride: 104 mmol/L (ref 98–111)
Creatinine, Ser: 1.21 mg/dL — ABNORMAL HIGH (ref 0.44–1.00)
GFR, Estimated: 46 mL/min — ABNORMAL LOW (ref >60)
Glucose, Bld: 183 mg/dL — ABNORMAL HIGH (ref 70–99)
Potassium: 4 mmol/L (ref 3.5–5.1)
Sodium: 138 mmol/L (ref 135–145)
Total Bilirubin: 0.4 mg/dL (ref 0.0–1.2)
Total Protein: 6.9 g/dL (ref 6.5–8.1)

## 2024-08-02 LAB — LAB REPORT - SCANNED
A1c: 9.7
Albumin, Urine POC: 353.8
Creatinine, POC: 67 mg/dL
EGFR: 43
Microalb Creat Ratio: 528

## 2024-08-02 LAB — URINALYSIS, W/ REFLEX TO CULTURE (INFECTION SUSPECTED)
Bilirubin Urine: NEGATIVE
Glucose, UA: >=500 mg/dL — AB
Hgb urine dipstick: NEGATIVE
Ketones, ur: NEGATIVE mg/dL
Nitrite: POSITIVE — AB
Protein, ur: 100 mg/dL — AB
Specific Gravity, Urine: 1.015 (ref 1.005–1.030)
WBC, UA: >50 WBC/hpf (ref 0–5)
pH: 5 (ref 5.0–8.0)

## 2024-08-02 LAB — GLUCOSE, CAPILLARY
Glucose-Capillary: 98 mg/dL (ref 70–99)
Glucose-Capillary: 175 mg/dL — ABNORMAL HIGH (ref 70–99)

## 2024-08-02 LAB — MAGNESIUM: Magnesium: 2 mg/dL (ref 1.7–2.4)

## 2024-08-02 LAB — PROTIME-INR
INR: 1 (ref 0.8–1.2)
Prothrombin Time: 14.1 s (ref 11.4–15.2)

## 2024-08-02 LAB — I-STAT CG4 LACTIC ACID, ED: Lactic Acid, Venous: 1 mmol/L (ref 0.5–1.9)

## 2024-08-02 MED ORDER — HYDROMORPHONE HCL 1 MG/ML IJ SOLN: 0.5000 mg | Freq: Once | INTRAMUSCULAR | Status: DC

## 2024-08-02 MED ORDER — ROSUVASTATIN CALCIUM 10 MG PO TABS
20.0000 mg | ORAL_TABLET | Freq: Every day | ORAL | Status: DC
  Administered 2024-08-02 – 2024-08-03 (×2): 20 mg via ORAL
  Filled 2024-08-02 (×2): qty 2

## 2024-08-02 MED ORDER — SODIUM CHLORIDE 0.9 % IV SOLN
1.0000 g | INTRAVENOUS | Status: DC
  Administered 2024-08-03: 1 g via INTRAVENOUS
  Filled 2024-08-02: qty 10

## 2024-08-02 MED ORDER — ONDANSETRON HCL 4 MG/2ML IJ SOLN
4.0000 mg | Freq: Once | INTRAMUSCULAR | Status: AC
Start: 2024-08-02 — End: 2024-08-02
  Administered 2024-08-02: 4 mg via INTRAVENOUS
  Filled 2024-08-02: qty 2

## 2024-08-02 MED ORDER — ACETAMINOPHEN 650 MG RE SUPP: 650.0000 mg | Freq: Four times a day (QID) | RECTAL | Status: DC | PRN

## 2024-08-02 MED ORDER — BUDESON-GLYCOPYRROL-FORMOTEROL 160-9-4.8 MCG/ACT IN AERO
1.0000 | INHALATION_SPRAY | Freq: Every day | RESPIRATORY_TRACT | Status: DC
  Administered 2024-08-02 – 2024-08-04 (×3): 1 via RESPIRATORY_TRACT
  Filled 2024-08-02: qty 5.9

## 2024-08-02 MED ORDER — ENOXAPARIN SODIUM 40 MG/0.4ML IJ SOSY
40.0000 mg | PREFILLED_SYRINGE | INTRAMUSCULAR | Status: DC
  Administered 2024-08-02 – 2024-08-03 (×2): 40 mg via SUBCUTANEOUS
  Filled 2024-08-02 (×2): qty 0.4

## 2024-08-02 MED ORDER — ACETAMINOPHEN 325 MG PO TABS
650.0000 mg | ORAL_TABLET | Freq: Once | ORAL | Status: AC
  Administered 2024-08-02: 650 mg via ORAL
  Filled 2024-08-02: qty 2

## 2024-08-02 MED ORDER — LACTATED RINGERS IV BOLUS
1000.0000 mL | Freq: Once | INTRAVENOUS | Status: AC
  Administered 2024-08-02: 1000 mL via INTRAVENOUS

## 2024-08-02 MED ORDER — HYDROMORPHONE HCL 1 MG/ML IJ SOLN
0.5000 mg | INTRAMUSCULAR | Status: DC | PRN
  Administered 2024-08-02 – 2024-08-03 (×2): 0.5 mg via INTRAVENOUS
  Filled 2024-08-02 (×2): qty 0.5

## 2024-08-02 MED ORDER — OXYCODONE-ACETAMINOPHEN 5-325 MG PO TABS
1.0000 | ORAL_TABLET | ORAL | Status: DC | PRN
  Administered 2024-08-02 – 2024-08-04 (×2): 1 via ORAL
  Filled 2024-08-02 (×2): qty 1

## 2024-08-02 MED ORDER — VITAMIN C 500 MG PO TABS
500.0000 mg | ORAL_TABLET | Freq: Every day | ORAL | Status: DC
  Administered 2024-08-03 – 2024-08-04 (×2): 500 mg via ORAL
  Filled 2024-08-02 (×2): qty 1

## 2024-08-02 MED ORDER — INSULIN GLARGINE 100 UNIT/ML ~~LOC~~ SOLN
20.0000 [IU] | Freq: Every day | SUBCUTANEOUS | Status: DC
  Administered 2024-08-03 – 2024-08-04 (×2): 20 [IU] via SUBCUTANEOUS
  Filled 2024-08-02 (×2): qty 0.2

## 2024-08-02 MED ORDER — TAMSULOSIN HCL 0.4 MG PO CAPS
0.4000 mg | ORAL_CAPSULE | Freq: Every day | ORAL | Status: DC
  Administered 2024-08-02 – 2024-08-04 (×3): 0.4 mg via ORAL
  Filled 2024-08-02 (×3): qty 1

## 2024-08-02 MED ORDER — IPRATROPIUM-ALBUTEROL 0.5-2.5 (3) MG/3ML IN SOLN: 3.0000 mL | RESPIRATORY_TRACT | Status: DC | PRN

## 2024-08-02 MED ORDER — LACTATED RINGERS IV BOLUS
500.0000 mL | Freq: Once | INTRAVENOUS | Status: AC
Start: 2024-08-02 — End: 2024-08-02
  Administered 2024-08-02: 500 mL via INTRAVENOUS

## 2024-08-02 MED ORDER — OXYCODONE-ACETAMINOPHEN 5-325 MG PO TABS
1.0000 | ORAL_TABLET | Freq: Once | ORAL | Status: AC
  Administered 2024-08-02: 1 via ORAL
  Filled 2024-08-02: qty 1

## 2024-08-02 MED ORDER — MELATONIN 5 MG PO TABS
5.0000 mg | ORAL_TABLET | Freq: Every evening | ORAL | Status: AC | PRN
  Administered 2024-08-02 – 2024-08-03 (×2): 5 mg via ORAL
  Filled 2024-08-02 (×2): qty 1

## 2024-08-02 MED ORDER — SODIUM CHLORIDE 0.9 % IV SOLN
1.0000 g | Freq: Once | INTRAVENOUS | Status: AC
  Administered 2024-08-02: 1 g via INTRAVENOUS
  Filled 2024-08-02: qty 10

## 2024-08-02 MED ORDER — AZELASTINE HCL 0.1 % NA SOLN: 2.0000 | Freq: Two times a day (BID) | NASAL | Status: DC | PRN

## 2024-08-02 MED ORDER — VITAMIN D 25 MCG (1000 UNIT) PO TABS
1000.0000 [IU] | ORAL_TABLET | Freq: Every day | ORAL | Status: DC
  Administered 2024-08-03 – 2024-08-04 (×2): 1000 [IU] via ORAL
  Filled 2024-08-02 (×2): qty 1

## 2024-08-02 MED ORDER — ONDANSETRON HCL 4 MG/2ML IJ SOLN: 4.0000 mg | Freq: Four times a day (QID) | INTRAMUSCULAR | Status: DC | PRN

## 2024-08-02 MED ORDER — FENTANYL CITRATE PF 50 MCG/ML IJ SOSY
50.0000 ug | PREFILLED_SYRINGE | INTRAMUSCULAR | Status: DC | PRN
  Administered 2024-08-02: 50 ug via INTRAVENOUS
  Filled 2024-08-02: qty 1

## 2024-08-02 MED ORDER — LORATADINE 10 MG PO TABS
10.0000 mg | ORAL_TABLET | Freq: Every day | ORAL | Status: DC
Start: 2024-08-02 — End: 2024-08-04
  Administered 2024-08-03: 10 mg via ORAL
  Filled 2024-08-02 (×2): qty 1

## 2024-08-02 MED ORDER — ROPINIROLE HCL 1 MG PO TABS
2.0000 mg | ORAL_TABLET | Freq: Every day | ORAL | Status: DC
  Administered 2024-08-02 – 2024-08-03 (×2): 2 mg via ORAL
  Filled 2024-08-02 (×2): qty 2

## 2024-08-02 MED ORDER — ACETAMINOPHEN 325 MG PO TABS
650.0000 mg | ORAL_TABLET | Freq: Four times a day (QID) | ORAL | Status: DC | PRN
  Administered 2024-08-03 (×3): 650 mg via ORAL
  Filled 2024-08-02 (×3): qty 2

## 2024-08-02 MED ORDER — INSULIN ASPART 100 UNIT/ML IJ SOLN
0.0000 [IU] | Freq: Three times a day (TID) | INTRAMUSCULAR | Status: DC
  Administered 2024-08-03: 3 [IU] via SUBCUTANEOUS
  Administered 2024-08-03 – 2024-08-04 (×3): 2 [IU] via SUBCUTANEOUS

## 2024-08-02 MED ORDER — MONTELUKAST SODIUM 10 MG PO TABS
10.0000 mg | ORAL_TABLET | Freq: Every day | ORAL | Status: DC
  Administered 2024-08-03: 10 mg via ORAL
  Filled 2024-08-02 (×2): qty 1

## 2024-08-02 MED ORDER — INSULIN ASPART 100 UNIT/ML IJ SOLN: 0.0000 [IU] | Freq: Every day | INTRAMUSCULAR | Status: DC

## 2024-08-02 MED ORDER — ASPIRIN 81 MG PO TBEC
81.0000 mg | DELAYED_RELEASE_TABLET | Freq: Every day | ORAL | Status: DC
  Administered 2024-08-02 – 2024-08-04 (×3): 81 mg via ORAL
  Filled 2024-08-02 (×3): qty 1

## 2024-08-02 MED ORDER — ONDANSETRON HCL 4 MG PO TABS: 4.0000 mg | ORAL_TABLET | Freq: Four times a day (QID) | ORAL | Status: DC | PRN

## 2024-08-02 NOTE — ED Notes (Signed)
PT ambulatory to restroom, gait steady.

## 2024-08-02 NOTE — ED Provider Notes (Signed)
 [place] EMERGENCY DEPARTMENT AT [facility] Provider Note   CSN: [id number] Arrival date & time: 08/02/24  1148     Patient presents with: Flank Pain   Pam Gamble is a 78 y.o. female.    Flank Pain Associated symptoms include abdominal pain.  Patient presents for hematuria and right flank pain.  Medical history includes COPD, GERD, depression, HLD, CKD.  For the past month, she has had intermittent right flank pain.  This worsened last night.  She developed subjective fever as well.  This morning, she had nausea and vomiting.  She was seen by her PCP and diagnosed with a UTI.  She was sent to the ED for evaluation of possible nephrolithiasis.  She does have a history of nephrolithiasis that did require urologic procedure in the past.  Patient describes her current pain as right flank with radiation to right back and right upper abdomen.  She denies current nausea.     Prior to Admission medications   Medication Sig Start Date End Date Taking? Authorizing Provider  aspirin  EC 81 MG tablet Take 81 mg by mouth daily.   Yes [provider]  Azelastine  HCl 137 MCG/SPRAY SOLN Place 2 sprays into both nostrils 2 (two) times daily as needed (for allergies). 03/21/20  Yes [provider]  Cholecalciferol  (VITAMIN D3) 25 MCG (1000 UT) CAPS Take 1,000 Units by mouth daily.   Yes [provider]  Continuous Glucose Sensor (FREESTYLE LIBRE 2 SENSOR) MISC Inject 1 Device into the skin every 14 (fourteen) days.   Yes [provider]  Cranberry-Vitamin C -Probiotic (AZO CRANBERRY PO) Take 1 capsule by mouth daily.   Yes [provider]  cyanocobalamin (VITAMIN B12) 1000 MCG tablet Take 1,000 mcg by mouth daily.   Yes [provider]  Fluticasone -Umeclidin-Vilant (TRELEGY ELLIPTA) 100-62.5-25 MCG/INH AEPB Inhale 1 puff into the lungs daily. 02/09/18  Yes Ruthell Lauraine FALCON, NP  ipratropium-albuterol  (DUONEB) 0.5-2.5 (3) MG/3ML SOLN Take  3 mLs by nebulization every 4 (four) hours as needed (for a COPD exacerbation or prior to performing yard work).   Yes [provider]  levocetirizine (XYZAL) 5 MG tablet Take 5 mg by mouth at bedtime. 07/05/21  Yes [provider]  montelukast  (SINGULAIR ) 10 MG tablet Take 10 mg by mouth at bedtime. 07/05/21  Yes [provider]  NOVOLOG  RELION 100 UNIT/ML injection Inject 3-8 Units into the skin See admin instructions. Inject 3-8 units into the skin three times a day before meals AS NEEDED, per sliding scale: BGL 200-224 = 3 UNITS, 225-249 = 4 UNITS, 250-274 = 5 UNITS, 275-299 = 6 UNITS, 300-324 = 7 UNITS, 325-349  = 8 UNITS - MAX NOTES DAILY DOSE: 15 units *as needed*   Yes [provider]  omeprazole  (PRILOSEC) 40 MG capsule Take 40 mg by mouth 2 (two) times daily before a meal.   Yes [provider]  ondansetron  (ZOFRAN -ODT) 4 MG disintegrating tablet Take 4 mg by mouth every 8 (eight) hours as needed for nausea or vomiting (dissolve orally). 03/11/22  Yes [provider]  OPCON-A 0.027-0.315 % SOLN Place 1 drop into both eyes 2 (two) times daily as needed (for itching).   Yes [provider]  OZEMPIC, 1 MG/DOSE, 4 MG/3ML SOPN Inject 1 mg into the skin every Saturday. 04/08/21  Yes [provider]  rOPINIRole  (REQUIP ) 2 MG tablet Take 2 mg by mouth at bedtime. 03/09/20  Yes [provider]  rosuvastatin  (CRESTOR ) 20 MG  tablet Take 20 mg by mouth at bedtime. 07/05/21  Yes [provider]  simethicone  (MYLICON) 80 MG chewable tablet Chew 1 tablet (80 mg total) by mouth 4 (four) times daily as needed for flatulence (dyspepsia, indigestion). 03/18/22  Yes Patel, Poonamkumari J, MD  tamsulosin  (FLOMAX ) 0.4 MG CAPS capsule Take 0.4 mg by mouth daily. 02/13/22  Yes [provider]  torsemide (DEMADEX) 10 MG tablet Take 10 mg by mouth in the morning. 05/02/23  Yes [provider]  TRESIBA FLEXTOUCH 100 UNIT/ML  FlexTouch Pen Inject 34 Units into the skin in the morning. 01/02/21  Yes [provider]  vitamin C  (ASCORBIC ACID ) 500 MG tablet Take 500 mg by mouth daily.   Yes [provider]    Allergies: Ertugliflozin , Amoxicillin-pot clavulanate, and Levaquin  [levofloxacin ]    Review of Systems  Constitutional:  Positive for fever.  Gastrointestinal:  Positive for abdominal pain, nausea and vomiting.  Genitourinary:  Positive for flank pain.  Musculoskeletal:  Positive for back pain.  All other systems reviewed and are negative.   Updated Vital Signs BP (!) 107/51 (BP Location: Right Arm)   Pulse 61   Temp 98.6 F (37 C) (Oral)   Resp 18   SpO2 95%   Physical Exam Vitals and nursing note reviewed.  Constitutional:      General: She is not in acute distress.    Appearance: Normal appearance. She is well-developed. She is not ill-appearing, toxic-appearing or diaphoretic.  HENT:     Head: Normocephalic and atraumatic.     Right Ear: External ear normal.     Left Ear: External ear normal.     Nose: Nose normal.     Mouth/Throat:     Mouth: Mucous membranes are moist.  Eyes:     Extraocular Movements: Extraocular movements intact.     Conjunctiva/sclera: Conjunctivae normal.  Cardiovascular:     Rate and Rhythm: Normal rate and regular rhythm.     Heart sounds: No murmur heard. Pulmonary:     Effort: Pulmonary effort is normal. No respiratory distress.  Abdominal:     Palpations: Abdomen is soft.     Tenderness: There is abdominal tenderness. There is right CVA tenderness. There is no left CVA tenderness, guarding or rebound.  Musculoskeletal:        General: No swelling.     Cervical back: Normal range of motion and neck supple.  Skin:    General: Skin is warm and dry.     Coloration: Skin is not jaundiced or pale.  Neurological:     General: No focal deficit present.     Mental Status: She is alert and oriented to person, place, and time.  Psychiatric:         Mood and Affect: Mood normal.        Behavior: Behavior normal.     (all labs ordered are listed, but only abnormal results are displayed) Labs Reviewed  COMPREHENSIVE METABOLIC PANEL WITH GFR - Abnormal; Notable for the following components:      Result Value   Glucose, Bld 183 (*)    Creatinine, Ser 1.21 (*)    Calcium  8.8 (*)    GFR, Estimated 46 (*)    All other components within normal limits  CBC WITH DIFFERENTIAL/PLATELET - Abnormal; Notable for the following components:   WBC 14.6 (*)    Hemoglobin 11.8 (*)    MCHC 29.8 (*)    Neutro Abs 8.7 (*)    Lymphs Abs  4.2 (*)    Monocytes Absolute 1.4 (*)    All other components within normal limits  URINALYSIS, W/ REFLEX TO CULTURE (INFECTION SUSPECTED) - Abnormal; Notable for the following components:   APPearance CLOUDY (*)    Glucose, UA >=500 (*)    Protein, ur 100 (*)    Nitrite POSITIVE (*)    Leukocytes,Ua LARGE (*)    Bacteria, UA MANY (*)    All other components within normal limits  CULTURE, BLOOD (ROUTINE X 2)  CULTURE, BLOOD (ROUTINE X 2)  URINE CULTURE  PROTIME-INR  MAGNESIUM  I-STAT CG4 LACTIC ACID, ED    EKG: None  Radiology: CT Renal Stone Study Result Date: 08/02/2024 CLINICAL DATA:  Flank and abdominal pain. EXAM: CT ABDOMEN AND PELVIS WITHOUT CONTRAST TECHNIQUE: Multidetector CT imaging of the abdomen and pelvis was performed following the standard protocol without IV contrast. RADIATION DOSE REDUCTION: This exam was performed according to the departmental dose-optimization program which includes automated exposure control, adjustment of the mA and/or kV according to patient size and/or use of iterative reconstruction technique. COMPARISON:  CT abdomen and pelvis 03/15/2022 FINDINGS: Lower chest: There is a band of atelectasis in the right lung base with small chronic right pleural effusion. Hepatobiliary: No focal liver abnormality is seen. Status post cholecystectomy. No biliary dilatation.  Pancreas: Unremarkable. No pancreatic ductal dilatation or surrounding inflammatory changes. Spleen: Absent.  Splenules are again seen. Adrenals/Urinary Tract: There is mild nonspecific perinephric fat stranding. There is a 7 mm calculus in the right renal pelvis. There is no hydronephrosis. The bilateral adrenal glands and bladder are within normal limits. Stomach/Bowel: Stomach is within normal limits. No evidence of bowel wall thickening, distention, or inflammatory changes. The appendix is not seen. Vascular/Lymphatic: Aortic atherosclerosis. No enlarged abdominal or pelvic lymph nodes. Reproductive: Status post hysterectomy. No adnexal masses. Other: There is a small fat containing umbilical hernia. There is no ascites. Musculoskeletal: Degenerative changes affect the spine. IMPRESSION: 1. 7 mm calculus in the right renal pelvis. No hydronephrosis. 2. Mild nonspecific perinephric fat stranding. Correlate clinically for infection. 3. Chronic small right pleural effusion. 4. Aortic atherosclerosis. Aortic Atherosclerosis (ICD10-I70.0). Electronically Signed   By: Greig Pique M.D.   On: 08/02/2024 16:37   DG Chest Portable 1 View Result Date: 08/02/2024 CLINICAL DATA:  Cough. EXAM: PORTABLE CHEST 1 VIEW COMPARISON:  03/15/2022 and CT chest 07/29/2024. FINDINGS: Trachea is midline. Heart is at the upper limits of normal in size to mildly enlarged. Thoracic aorta is calcified. No airspace consolidation or pleural fluid. Biapical pleuroparenchymal scarring. IMPRESSION: No acute findings. Electronically Signed   By: Newell Eke M.D.   On: 08/02/2024 13:52     Procedures   Medications Ordered in the ED  fentaNYL  (SUBLIMAZE ) injection 50 mcg (50 mcg Intravenous Given 08/02/24 1259)  lactated ringers  bolus 1,000 mL (has no administration in time range)  lactated ringers  bolus 500 mL (0 mLs Intravenous Stopped 08/02/24 1340)  ondansetron  (ZOFRAN ) injection 4 mg (4 mg Intravenous Given 08/02/24 1258)   acetaminophen  (TYLENOL ) tablet 650 mg (650 mg Oral Given 08/02/24 1311)  oxyCODONE -acetaminophen  (PERCOCET/ROXICET) 5-325 MG per tablet 1 tablet (1 tablet Oral Given 08/02/24 1312)  cefTRIAXone  (ROCEPHIN ) 1 g in sodium chloride  0.9 % 100 mL IVPB (0 g Intravenous Stopped 08/02/24 1520)                                    Medical Decision  Making Amount and/or Complexity of Data Reviewed Labs: ordered. Radiology: ordered.  Risk OTC drugs. Prescription drug management.   This patient presents to the ED for concern of right flank pain, this involves an extensive number of treatment options, and is a complaint that carries with it a high risk of complications and morbidity.  The differential diagnosis includes nephrolithiasis, UTI, colitis, cholangitis, enteritis   Co morbidities / Chronic conditions that complicate the patient evaluation  COPD, GERD, depression, HLD, CKD   Additional history obtained:  Additional history obtained from EMR External records from outside source obtained and reviewed including N/A   Lab Tests:  I Ordered, and personally interpreted labs.  The pertinent results include: Creatinine is baseline.  Electrolytes are normal.  A leukocytosis is present.  Urinalysis shows infection.   Imaging Studies ordered:  I ordered imaging studies including chest x-ray, CT stone study I independently visualized and interpreted imaging which showed calculus in right renal pelvis without hydronephrosis I agree with the radiologist interpretation   Cardiac Monitoring: / EKG:  The patient was maintained on a cardiac monitor.  I personally viewed and interpreted the cardiac monitored which showed an underlying rhythm of: Sinus rhythm   Problem List / ED Course / Critical interventions / Medication management  Patient presents for right flank pain with radiation to abdomen and back in addition to fevers, nausea, vomiting.  Although she has had intermittent right flank  pain over the past month, symptoms worsened last night and continued today.  She was seen by PCP and diagnosed with a UTI.  She was sent to the ED for further evaluation.  On arrival, patient is overall well-appearing.  Vital signs are notable for low-grade fever.  Tylenol  was ordered for antipyresis.  Percocet ordered for analgesia.  Patient denies any current nausea.  Workup was initiated.  Urinalysis confirms infection.  Urine cultures were ordered.  Ceftriaxone  was ordered.  Further lab work shows leukocytosis.  Her creatinine is baseline.  X-ray of chest did not show any acute findings.  CT scan showed redemonstration of calculus in right renal pelvis without evidence of obstruction.  Patient to be admitted for further management. I ordered medication including IV fluids for hydration, ceftriaxone  for UTI, Percocet and fentanyl  for analgesia, Tylenol  for antipyresis, Zofran  for nausea, Reevaluation of the patient after these medicines showed that the patient improved I have reviewed the patients home medicines and have made adjustments as needed  Social Determinants of Health:  Has PCP      Final diagnoses:  Pyelonephritis    ED Discharge Orders     None          Melvenia Motto, MD 08/02/24 1645

## 2024-08-02 NOTE — H&P (Signed)
 History and Physical    Pam Gamble FMW:[id number] DOB: Apr 12, 1946 DOA: 08/02/2024  I have briefly reviewed the patient's prior medical records in [facility]  PCP: Clemmie Nest, MD  Patient coming from: home  Chief Complaint: right flank pain, nausea/vomiting  HPI: Pam Gamble is a 78 y.o. female with medical history significant of COPD, DM 2, CKD 3A, history of nephrolithiasis comes into the hospital with complaints of right-sided flank pain, right-sided inguinal pain, subjective fever, chills as well as nausea and vomiting.  Patient tells me that her symptoms have been more or less for the past month, has seen her PCP on multiple occasions and has completed few rounds of antibiotics in the last few weeks.  She has known nephrolithiasis and is scheduled to see urology in [facility] on October 10.  Due to persistent symptoms, ongoing right-sided flank pain, and now inability for any p.o. intake with nausea, vomiting since yesterday, along with fevers at home prompted her to come to the emergency room.  She denies any chest pain, but does complain of shortness of breath which is chronic for her.  She has been having increased urination over the last 24 hours, having to be up every couple hours last night to go to the bathroom, and there was despite her not taking her home diuretics.  ED Course: In the emergency room she has a temp of 100.4, she is normotensive (there is a recorded blood pressure of 62/48), that appears isolated and unlikely real.  She is satting well on room air.  Her blood work reveals a creatinine of 1.2, white count of 14.6.  Urinalysis shows many bacteria and pyuria.  CT of the abdomen shows a 7 mm right renal pelvis stone without hydronephrosis, and nonspecific perinephric fat stranding.  Due to poor p.o. intake, fever, leukocytosis she was given ceftriaxone  and we are asked to admit  Review of Systems: All systems reviewed, and apart from HPI, all  negative  Past Medical History:  Diagnosis Date   Acute biliary pancreatitis 10/01/2018   Acute cholangitis 10/01/2018   Acute non-recurrent maxillary sinusitis 01/12/2011   Qualifier: Diagnosis of  By: Alvan MD, [name]     Acute on chronic renal failure 03/15/2022   Allergic rhinitis 01/08/2008   Chronic     Atrophic vaginitis 05/03/2015   6/16    Barrett's esophagus    C O P D WITH ACUTE EXACERBATION 03/21/2010   9/13, 11/14, 8/18  [name], Harden GAILS.  Potential benefits of a short/long term steroid  use as well as potential risks  and complications were explained to the patient and were aknowledged.      Calcium  deposits in tendon and bursa    CAROTID BRUIT 02/09/2008   Qualifier: Diagnosis of  By: Plotnikov MD, Karlynn GAILS    Chest discomfort 06/22/2019   COPD (chronic obstructive pulmonary disease) (HCC)    COPD, frequent exacerbations (HCC) 09/15/2007   Dr Theophilus Chronic, severe Advair Albuterol  HHN prn Steroids - frequent use  Pt sleeps in a recliner since 2012    Cough 05/23/2012   Depression    Diabetes mellitus due to underlying condition with unspecified complications (HCC) 06/22/2019   Diabetes type 2, uncontrolled 12/27/2013   2/15 2/17 Levemir  - too $$$. StarteToujeo  (10/17) samples Continue with current prescription therapy as reflected on the Med list - Levemir . Risks associated with treatment noncompliance were discussed. Compliance was encouraged. 2018 Metformin  and NPH insulin  5/18 Use NPH insulin   40 u in am and 10 u at night. Titrate up by 1 unit a day each (AM and HS doses) for goal sugars of 100-130 up to 60    Dyspnea on exertion 06/22/2019   Edema 09/09/2014   10/15 B LE    Epigastric pain 07/12/2014   8/15 lower R>L ? etiol - r/o pyelo and others   Esophageal reflux    Ex-smoker 06/22/2019   Fever, unspecified 07/12/2014   8/15 mild T99F    Hyperkalemia    HYSTERECTOMY, PARTIAL, HX OF 09/26/2007   Qualifier: Diagnosis of   By: Nickola CMA, [name]       Replacing diagnoses that were inactivated after the 02/10/23 regulatory import   Idiopathic thrombocythemia (HCC)    Kidney stone 07/27/2014   2014 Dr Aleene   Kidney stones    LATERAL EPICONDYLITIS 09/26/2007   Qualifier: History of   By: Nickola CMA, [name]      Replacing diagnoses that were inactivated after the 02/10/23 regulatory import   Leg cramp 04/15/2014    Albuterol  can give cramps    Low back pain 04/25/2017   Spinal stenosis   Major depressive disorder, recurrent episode 06/14/2009   Chronic      Mixed dyslipidemia 03/28/2020   Noncompliance 09/10/2016   Recurrent    Pericardial effusion 06/22/2019   Personal history of malignant neoplasm of other parts of uterus    Personal history of other diseases of digestive system    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   Remote  Levaquin  Rx Vaccines    Pyelonephritis, acute 02/26/2011   2012, 2015 L    Renal failure 03/16/2022   Rotator cuff arthropathy 03/21/2015   Injection 03/21/2015    Shortness of breath    upon exertion   Tick bite 01/18/2017   TOBACCO USE, QUIT 01/10/2010   Qualifier: Diagnosis of  By: Ronnald Ards      Past Surgical History:  Procedure Laterality Date   ABDOMINAL HYSTERECTOMY     CATARACT EXTRACTION Bilateral 01/2016 and 03/2016   CHOLECYSTECTOMY N/A 10/03/2018   Procedure: LAPAROSCOPIC CHOLECYSTECTOMY WITH INTRAOPERATIVE CHOLANGIOGRAM;  Surgeon: Ethyl Lenis, MD;  Location: [facility];  Service: General;  Laterality: N/A;   LITHOTRIPSY     REFRACTIVE SURGERY Right 05/16/2016   SPLENECTOMY  1999   TONSILLECTOMY AND ADENOIDECTOMY       reports that she quit smoking about 18 years ago. Her smoking use included cigarettes. She started smoking about 67 years ago. She has a 98 pack-year smoking history. She has never used smokeless tobacco. She reports current alcohol  use. She reports that she does not use drugs.  Allergies  Allergen Reactions   Ertugliflozin  Anaphylaxis, Anxiety and Other (See Comments)     Steglatro = It made me feel funny. (Steglatro (ertugliflozin ) is a prescription medication used alongside diet and exercise to manage high blood sugar in adults with type 2 diabetes.)   Amoxicillin-Pot Clavulanate Diarrhea, Nausea Only and Other (See Comments)    Sick to stomach Has patient had a PCN reaction causing immediate rash, facial/tongue/throat swelling, SOB or lightheadedness with hypotension: no Has patient had a PCN reaction causing severe rash involving mucus membranes or skin necrosis: no Has patient had a PCN reaction that required hospitalization: no Has patient had a PCN reaction occurring within the last 10 years: no If all of the above answers are NO, then may proceed with Cephalosporin use.    Levaquin  [Levofloxacin ] Nausea Only    Family History  Problem Relation Age of  Onset   Liver cancer Sister    Pancreatic cancer Sister    Stroke Mother    Heart disease Mother        CHF   Heart disease Father        MI   Diabetes Father    Diabetes Sister    Diabetes Maternal Aunt     Prior to Admission medications   Medication Sig Start Date End Date Taking? Authorizing Provider  aspirin  EC 81 MG tablet Take 81 mg by mouth daily.   Yes [provider]  Azelastine  HCl 137 MCG/SPRAY SOLN Place 2 sprays into both nostrils 2 (two) times daily as needed (for allergies). 03/21/20  Yes [provider]  Cholecalciferol  (VITAMIN D3) 25 MCG (1000 UT) CAPS Take 1,000 Units by mouth daily.   Yes [provider]  Continuous Glucose Sensor (FREESTYLE LIBRE 2 SENSOR) MISC Inject 1 Device into the skin every 14 (fourteen) days.   Yes [provider]  Cranberry-Vitamin C -Probiotic (AZO CRANBERRY PO) Take 1 capsule by mouth daily.   Yes [provider]  cyanocobalamin (VITAMIN B12) 1000 MCG tablet Take 1,000 mcg by mouth daily.   Yes [provider]  Fluticasone -Umeclidin-Vilant (TRELEGY ELLIPTA) 100-62.5-25 MCG/INH AEPB Inhale 1  puff into the lungs daily. 02/09/18  Yes Ruthell Lauraine FALCON, NP  ipratropium-albuterol  (DUONEB) 0.5-2.5 (3) MG/3ML SOLN Take 3 mLs by nebulization every 4 (four) hours as needed (for a COPD exacerbation or prior to performing yard work).   Yes [provider]  levocetirizine (XYZAL) 5 MG tablet Take 5 mg by mouth at bedtime. 07/05/21  Yes [provider]  montelukast  (SINGULAIR ) 10 MG tablet Take 10 mg by mouth at bedtime. 07/05/21  Yes [provider]  NOVOLOG  RELION 100 UNIT/ML injection Inject 3-8 Units into the skin See admin instructions. Inject 3-8 units into the skin three times a day before meals AS NEEDED, per sliding scale: BGL 200-224 = 3 UNITS, 225-249 = 4 UNITS, 250-274 = 5 UNITS, 275-299 = 6 UNITS, 300-324 = 7 UNITS, 325-349  = 8 UNITS - MAX NOTES DAILY DOSE: 15 units *as needed*   Yes [provider]  omeprazole  (PRILOSEC) 40 MG capsule Take 40 mg by mouth 2 (two) times daily before a meal.   Yes [provider]  ondansetron  (ZOFRAN -ODT) 4 MG disintegrating tablet Take 4 mg by mouth every 8 (eight) hours as needed for nausea or vomiting (dissolve orally). 03/11/22  Yes [provider]  OPCON-A 0.027-0.315 % SOLN Place 1 drop into both eyes 2 (two) times daily as needed (for itching).   Yes [provider]  OZEMPIC, 1 MG/DOSE, 4 MG/3ML SOPN Inject 1 mg into the skin every Saturday. 04/08/21  Yes [provider]  rOPINIRole  (REQUIP ) 2 MG tablet Take 2 mg by mouth at bedtime. 03/09/20  Yes [provider]  rosuvastatin  (CRESTOR ) 20 MG tablet Take 20 mg by mouth at bedtime. 07/05/21  Yes [provider]  simethicone  (MYLICON) 80 MG chewable tablet Chew 1 tablet (80 mg total) by mouth 4 (four) times daily as needed for flatulence (dyspepsia, indigestion). 03/18/22  Yes Patel, Poonamkumari J, MD  tamsulosin  (FLOMAX ) 0.4 MG CAPS capsule Take 0.4 mg by mouth daily. 02/13/22  Yes [provider]  torsemide (DEMADEX)  10 MG tablet Take 10 mg by mouth in the morning. 05/02/23  Yes [provider]  TRESIBA FLEXTOUCH 100 UNIT/ML FlexTouch Pen Inject 34 Units into the skin in the morning. 01/02/21  Yes [provider]  vitamin C  (ASCORBIC ACID ) 500 MG tablet Take 500 mg by mouth daily.   Yes [provider]    Physical Exam: Vitals:   08/02/24 1530 08/02/24 1545 08/02/24 1615 08/02/24 1640  BP: (!) 100/46 (!) 109/57  (!) 107/51  Pulse: 70 70 64 61  Resp:    18  Temp:    98.6 F (37 C)  TempSrc:    Oral  SpO2: 94% 100% 95% 95%   Constitutional: NAD, calm, comfortable Eyes: PERRL, lids and conjunctivae normal ENMT: Mucous membranes are moist. Posterior pharynx clear of any exudate or lesions.Normal dentition.  Neck: normal, supple Respiratory: clear to auscultation bilaterally, no wheezing, no crackles.  Cardiovascular: Regular rate and rhythm, no murmurs / rubs / gallops.  Trace edema Abdomen: Tenderness to palpation in the right lower quadrant, CVA tenderness on the right Musculoskeletal: no clubbing / cyanosis. Normal muscle tone.  Skin: no rashes, lesions, ulcers. No induration Neurologic: Grossly nonfocal  Labs on Admission: I have personally reviewed following labs and imaging studies  CBC: Recent Labs  Lab 08/02/24 1203  WBC 14.6*  NEUTROABS 8.7*  HGB 11.8*  HCT 39.6  MCV 97.3  PLT 359   Basic Metabolic Panel: Recent Labs  Lab 08/02/24 1203 08/02/24 1235  NA 138  --   K 4.0  --   CL 104  --   CO2 22  --   GLUCOSE 183*  --   BUN 15  --   CREATININE 1.21*  --   CALCIUM  8.8*  --   MG  --  2.0   Liver Function Tests: Recent Labs  Lab 08/02/24 1203  AST 17  ALT 11  ALKPHOS 70  BILITOT 0.4  PROT 6.9  ALBUMIN 3.6   Coagulation Profile: Recent Labs  Lab 08/02/24 1203  INR 1.0   BNP (last 3 results) No results for input(s): PROBNP in the last 8760 hours. CBG: No results for input(s): GLUCAP in the last 168 hours. Thyroid  Function  Tests: No results for input(s): TSH, T4TOTAL, FREET4, T3FREE, THYROIDAB in the last 72 hours. Urine analysis:    Component Value Date/Time   COLORURINE YELLOW 08/02/2024 1235   APPEARANCEUR CLOUDY (A) 08/02/2024 1235   LABSPEC 1.015 08/02/2024 1235   PHURINE 5.0 08/02/2024 1235   GLUCOSEU >=500 (A) 08/02/2024 1235   GLUCOSEU >=1000 (A) 12/12/2016 0853   HGBUR NEGATIVE 08/02/2024 1235   BILIRUBINUR NEGATIVE 08/02/2024 1235   KETONESUR NEGATIVE 08/02/2024 1235   PROTEINUR 100 (A) 08/02/2024 1235   UROBILINOGEN 0.2 12/12/2016 0853   NITRITE POSITIVE (A) 08/02/2024 1235   LEUKOCYTESUR LARGE (A) 08/02/2024 1235     Radiological Exams on Admission: CT Renal Stone Study Result Date: 08/02/2024 CLINICAL DATA:  Flank and abdominal pain. EXAM: CT ABDOMEN AND PELVIS WITHOUT CONTRAST TECHNIQUE: Multidetector CT imaging of the abdomen and pelvis was performed following the standard protocol without IV contrast. RADIATION DOSE REDUCTION: This exam was performed according to the departmental dose-optimization program which includes automated exposure control, adjustment of the mA and/or kV according to patient size and/or use of iterative reconstruction technique. COMPARISON:  CT abdomen and pelvis 03/15/2022 FINDINGS: Lower chest: There is a band of atelectasis in the right lung base with small chronic right pleural effusion. Hepatobiliary: No focal liver abnormality is seen. Status post cholecystectomy. No biliary dilatation. Pancreas: Unremarkable. No pancreatic ductal dilatation or surrounding inflammatory changes. Spleen: Absent.  Splenules are again seen. Adrenals/Urinary Tract: There is mild nonspecific perinephric fat  stranding. There is a 7 mm calculus in the right renal pelvis. There is no hydronephrosis. The bilateral adrenal glands and bladder are within normal limits. Stomach/Bowel: Stomach is within normal limits. No evidence of bowel wall thickening, distention, or inflammatory  changes. The appendix is not seen. Vascular/Lymphatic: Aortic atherosclerosis. No enlarged abdominal or pelvic lymph nodes. Reproductive: Status post hysterectomy. No adnexal masses. Other: There is a small fat containing umbilical hernia. There is no ascites. Musculoskeletal: Degenerative changes affect the spine. IMPRESSION: 1. 7 mm calculus in the right renal pelvis. No hydronephrosis. 2. Mild nonspecific perinephric fat stranding. Correlate clinically for infection. 3. Chronic small right pleural effusion. 4. Aortic atherosclerosis. Aortic Atherosclerosis (ICD10-I70.0). Electronically Signed   By: Greig Pique M.D.   On: 08/02/2024 16:37   DG Chest Portable 1 View Result Date: 08/02/2024 CLINICAL DATA:  Cough. EXAM: PORTABLE CHEST 1 VIEW COMPARISON:  03/15/2022 and CT chest 07/29/2024. FINDINGS: Trachea is midline. Heart is at the upper limits of normal in size to mildly enlarged. Thoracic aorta is calcified. No airspace consolidation or pleural fluid. Biapical pleuroparenchymal scarring. IMPRESSION: No acute findings. Electronically Signed   By: Newell Eke M.D.   On: 08/02/2024 13:52    EKG: Independently reviewed.  Sinus rhythm on monitor  Assessment/Plan Principal problem UTI/pyelonephritis, nephrolithiasis -patient was found to have pyelonephritis and acute cystitis.  This is evident by her symptoms, imaging, urinalysis suggesting a UTI.  Blood cultures and urine culture sent, continue ceftriaxone  - She has a 7 mm stone which is very less likely to pass on its own.  She already has an appointment on October 10 with urology in [facility], if she fails to improve with antibiotics alone may need nonurgent urology input  Active problems Nausea, vomiting, abdominal pain-due to #1.  Antiemetics, pain control  Type 2 diabetes mellitus -continue home medications, placed on sliding scale  History of COPD-she has no wheezing, satting well on room air.  Continue home medications  CKD 3A-prior  creatinine in our system 1.3 in 2023.  Currently at baseline  Hyperlipidemia-continue statin  DVT prophylaxis: Lovenox  Code Status: DNR per patient   Family Communication: no family at bedside  Bed Type: MedSurg Consults called: none  Obs/Inp: obs  Nilda Fendt, MD, PhD [facility]  Contact via [email or web address]  08/02/2024, 5:01 PM

## 2024-08-02 NOTE — ED Triage Notes (Signed)
 Pt reports right sided flank pain and blood in her urine. Pt seen by PCP this morning and dx with UTI but was sent due to possible kidney stone. Also reporting nausea.

## 2024-08-03 DIAGNOSIS — Z8249 Family history of ischemic heart disease and other diseases of the circulatory system: Secondary | ICD-10-CM | POA: Diagnosis not present

## 2024-08-03 DIAGNOSIS — Z794 Long term (current) use of insulin: Secondary | ICD-10-CM | POA: Diagnosis not present

## 2024-08-03 DIAGNOSIS — Z87891 Personal history of nicotine dependence: Secondary | ICD-10-CM | POA: Diagnosis not present

## 2024-08-03 DIAGNOSIS — Z823 Family history of stroke: Secondary | ICD-10-CM | POA: Diagnosis not present

## 2024-08-03 DIAGNOSIS — N1831 Chronic kidney disease, stage 3a: Secondary | ICD-10-CM | POA: Diagnosis not present

## 2024-08-03 DIAGNOSIS — D631 Anemia in chronic kidney disease: Secondary | ICD-10-CM | POA: Diagnosis not present

## 2024-08-03 DIAGNOSIS — Z9841 Cataract extraction status, right eye: Secondary | ICD-10-CM | POA: Diagnosis not present

## 2024-08-03 DIAGNOSIS — E782 Mixed hyperlipidemia: Secondary | ICD-10-CM | POA: Diagnosis not present

## 2024-08-03 DIAGNOSIS — N10 Acute tubulo-interstitial nephritis: Secondary | ICD-10-CM | POA: Diagnosis not present

## 2024-08-03 DIAGNOSIS — Z87442 Personal history of urinary calculi: Secondary | ICD-10-CM | POA: Diagnosis not present

## 2024-08-03 DIAGNOSIS — Z8542 Personal history of malignant neoplasm of other parts of uterus: Secondary | ICD-10-CM | POA: Diagnosis not present

## 2024-08-03 DIAGNOSIS — Z7982 Long term (current) use of aspirin: Secondary | ICD-10-CM | POA: Diagnosis not present

## 2024-08-03 DIAGNOSIS — N200 Calculus of kidney: Secondary | ICD-10-CM | POA: Diagnosis not present

## 2024-08-03 DIAGNOSIS — Z8589 Personal history of malignant neoplasm of other organs and systems: Secondary | ICD-10-CM | POA: Diagnosis not present

## 2024-08-03 DIAGNOSIS — K219 Gastro-esophageal reflux disease without esophagitis: Secondary | ICD-10-CM | POA: Diagnosis not present

## 2024-08-03 DIAGNOSIS — Z7985 Long-term (current) use of injectable non-insulin antidiabetic drugs: Secondary | ICD-10-CM | POA: Diagnosis not present

## 2024-08-03 DIAGNOSIS — I5032 Chronic diastolic (congestive) heart failure: Secondary | ICD-10-CM | POA: Diagnosis not present

## 2024-08-03 DIAGNOSIS — E119 Type 2 diabetes mellitus without complications: Secondary | ICD-10-CM | POA: Diagnosis not present

## 2024-08-03 DIAGNOSIS — N12 Tubulo-interstitial nephritis, not specified as acute or chronic: Secondary | ICD-10-CM | POA: Diagnosis present

## 2024-08-03 DIAGNOSIS — Z833 Family history of diabetes mellitus: Secondary | ICD-10-CM | POA: Diagnosis not present

## 2024-08-03 DIAGNOSIS — Z9842 Cataract extraction status, left eye: Secondary | ICD-10-CM | POA: Diagnosis not present

## 2024-08-03 DIAGNOSIS — Z79899 Other long term (current) drug therapy: Secondary | ICD-10-CM | POA: Diagnosis not present

## 2024-08-03 DIAGNOSIS — G2581 Restless legs syndrome: Secondary | ICD-10-CM | POA: Diagnosis not present

## 2024-08-03 DIAGNOSIS — Z66 Do not resuscitate: Secondary | ICD-10-CM | POA: Diagnosis not present

## 2024-08-03 DIAGNOSIS — J449 Chronic obstructive pulmonary disease, unspecified: Secondary | ICD-10-CM | POA: Diagnosis not present

## 2024-08-03 LAB — COMPREHENSIVE METABOLIC PANEL WITH GFR
ALT: 10 U/L (ref 0–44)
AST: 17 U/L (ref 15–41)
Albumin: 3.2 g/dL — ABNORMAL LOW (ref 3.5–5.0)
Alkaline Phosphatase: 66 U/L (ref 38–126)
Anion gap: 11 (ref 5–15)
BUN: 15 mg/dL (ref 8–23)
CO2: 23 mmol/L (ref 22–32)
Calcium: 8.4 mg/dL — ABNORMAL LOW (ref 8.9–10.3)
Chloride: 104 mmol/L (ref 98–111)
Creatinine, Ser: 1.16 mg/dL — ABNORMAL HIGH (ref 0.44–1.00)
GFR, Estimated: 48 mL/min — ABNORMAL LOW (ref >60)
Glucose, Bld: 119 mg/dL — ABNORMAL HIGH (ref 70–99)
Potassium: 4.3 mmol/L (ref 3.5–5.1)
Sodium: 137 mmol/L (ref 135–145)
Total Bilirubin: 0.3 mg/dL (ref 0.0–1.2)
Total Protein: 6.1 g/dL — ABNORMAL LOW (ref 6.5–8.1)

## 2024-08-03 LAB — GLUCOSE, CAPILLARY
Glucose-Capillary: 139 mg/dL — ABNORMAL HIGH (ref 70–99)
Glucose-Capillary: 145 mg/dL — ABNORMAL HIGH (ref 70–99)
Glucose-Capillary: 171 mg/dL — ABNORMAL HIGH (ref 70–99)
Glucose-Capillary: 152 mg/dL — ABNORMAL HIGH (ref 70–99)

## 2024-08-03 LAB — CBC
HCT: 37.5 % (ref 36.0–46.0)
Hemoglobin: 10.8 g/dL — ABNORMAL LOW (ref 12.0–15.0)
MCH: 28.6 pg (ref 26.0–34.0)
MCHC: 28.8 g/dL — ABNORMAL LOW (ref 30.0–36.0)
MCV: 99.5 fL (ref 80.0–100.0)
Platelets: 332 K/uL (ref 150–400)
RBC: 3.77 MIL/uL — ABNORMAL LOW (ref 3.87–5.11)
RDW: 13.9 % (ref 11.5–15.5)
WBC: 12.4 K/uL — ABNORMAL HIGH (ref 4.0–10.5)
nRBC: 0 % (ref 0.0–0.2)

## 2024-08-03 LAB — HEMOGLOBIN A1C
Hgb A1c MFr Bld: 8.4 % — ABNORMAL HIGH (ref 4.8–5.6)
Mean Plasma Glucose: 194.38 mg/dL

## 2024-08-03 MED ORDER — DIPHENHYDRAMINE-ZINC ACETATE 2-0.1 % EX CREA
TOPICAL_CREAM | Freq: Every day | CUTANEOUS | Status: DC | PRN
  Administered 2024-08-03: 1 via TOPICAL
  Filled 2024-08-03: qty 28

## 2024-08-03 MED ORDER — CEFTRIAXONE SODIUM 1 G IJ SOLR
1.0000 g | INTRAMUSCULAR | Status: AC
  Administered 2024-08-03: 1 g via INTRAVENOUS
  Filled 2024-08-03: qty 10

## 2024-08-03 MED ORDER — SODIUM CHLORIDE 0.9 % IV SOLN: 2.0000 g | INTRAVENOUS | Status: DC

## 2024-08-03 NOTE — Progress Notes (Signed)
 PROGRESS NOTE   Pam Gamble  FMW:[id number]    DOB: 01-Aug-1946    DOA: 08/02/2024  PCP: Clemmie Nest, MD   I have briefly reviewed patients previous medical records in [facility]   Brief Hospital Course:  78 year old female with PMH of COPD, type II DM, CKD stage IIIa, nephrolithiasis, presented to the ED with complaints of right-sided flank to groin pain, subjective fever, chills, nausea, vomiting and poor oral intake.  She reported that she had symptoms for a month more or less, seen by her PCP on multiple occasions and completed a few courses of antibiotics in the last few weeks.  She is scheduled to see outpatient urology in [facility] on 08/20/2024.  Since hospital arrival, initial low-grade fever and normotensive but later was hypotensive as low as 62/48 mmHg in ED, then febrile to 101 F on hospital day 2.  Admitted for E. coli acute right pyelonephritis, continue empiric IV ceftriaxone  pending final sensitivities.  Improving.   Assessment & Plan:   E. coli acute right pyelonephritis Nonobstructive nephrolithiasis Met SIRS criteria on admission.  Sepsis ruled out. CT renal stone study: 7 mm calculus in the right renal pelvis.  No hydronephrosis.  Mild nonspecific perinephric fat stranding. UA with cloudy, nitrate positive, many bacteria, significant pyuria.  Urine culture shows >100 K colonies per mL of E. coli, sensitivities pending.  Blood cultures x 2: Negative to date. Continue empirically started IV ceftriaxone  but will increase dose to 2 g daily. Advised to keep the Urology office visit appointment on 08/20/2024. Continues tamsulosin .  Nausea, vomiting and abdominal pain Secondary to pyelonephritis. Improved  Type II DM A1c 7.8 on 03/17/2022.  Reasonable control on current insulins (Lantus  20 units daily, NovoLog  SSI.) monitor CBGs closely and adjust insulins as needed.  At home on Tresiba 34 units daily and also on Ozempic-on hold. Added  A1c  COPD Stable without clinical bronchospasm.  Substituted Breztri  for Trelegy Ellipta, continue Singulair , as needed DuoNebs, as needed Astelin  spray.  On Claritin .  Stage IIIa CKD Stable  Hyperlipidemia Continue rosuvastatin   GERD Continue PPI.  Normocytic anemia, suspected due to chronic disease Relatively stable.  Follow CBCs.  Body mass index is 29.35 kg/m.   DVT prophylaxis: enoxaparin  (LOVENOX ) injection 40 mg Start: 08/02/24 2200     Code Status: Limited: Do not attempt resuscitation (DNR) -DNR-LIMITED -Do Not Intubate/DNI :  Family Communication: None at bedside Disposition:  Status is: Observation The patient will require care spanning > 2 midnights and should be moved to inpatient because: Clearly inpatient given severity of presentation, continuing IV antibiotics and care will cross 2 midnights.     Consultants:   None  Procedures:     Subjective:  Feels better.  No nausea, vomiting or diarrhea reported.  Still has right flank to groin pain but better compared to before.  Does report dysuria and urinary frequency.  Objective:   Vitals:   08/03/24 0125 08/03/24 0509 08/03/24 0908 08/03/24 1223  BP: (!) 106/49 (!) 117/50  (!) 114/47  Pulse: 66 89  70  Resp:    16  Temp: 98.2 F (36.8 C) (!) 101 F (38.3 C)  98.5 F (36.9 C)  TempSrc:      SpO2: 95% 90% 91% 93%  Weight:      Height:        General exam: Elderly female, moderately built and nourished sitting up comfortably at edge of bed.  Does not appear septic or toxic. Respiratory system: Clear  to auscultation. Respiratory effort normal. Cardiovascular system: S1 & S2 heard, RRR. No JVD, murmurs, rubs, gallops or clicks. No pedal edema. Gastrointestinal system: Abdomen is nondistended, soft and nontender. No organomegaly or masses felt. Normal bowel sounds heard. Central nervous system: Alert and oriented. No focal neurological deficits. Extremities: Symmetric 5 x 5 power. Skin: No rashes,  lesions or ulcers Psychiatry: Judgement and insight appear normal. Mood & affect appropriate.     Data Reviewed:   I have personally reviewed following labs and imaging studies   CBC: Recent Labs  Lab 08/02/24 1203 08/03/24 0434  WBC 14.6* 12.4*  NEUTROABS 8.7*  --   HGB 11.8* 10.8*  HCT 39.6 37.5  MCV 97.3 99.5  PLT 359 332    Basic Metabolic Panel: Recent Labs  Lab 08/02/24 1203 08/02/24 1235 08/03/24 0434  NA 138  --  137  K 4.0  --  4.3  CL 104  --  104  CO2 22  --  23  GLUCOSE 183*  --  119*  BUN 15  --  15  CREATININE 1.21*  --  1.16*  CALCIUM  8.8*  --  8.4*  MG  --  2.0  --     Liver Function Tests: Recent Labs  Lab 08/02/24 1203 08/03/24 0434  AST 17 17  ALT 11 10  ALKPHOS 70 66  BILITOT 0.4 0.3  PROT 6.9 6.1*  ALBUMIN 3.6 3.2*    CBG: Recent Labs  Lab 08/02/24 2153 08/03/24 0807 08/03/24 1226  GLUCAP 175* 139* 145*    Microbiology Studies:   Recent Results (from the past 240 hours)  Culture, blood (Routine x 2)     Status: None (Preliminary result)   Collection Time: 08/02/24 12:35 PM   Specimen: BLOOD  Result Value Ref Range Status   Specimen Description   Final    BLOOD RIGHT ANTECUBITAL Performed at [facility], [address] [place], [place] [address]    Special Requests   Final    BOTTLES DRAWN AEROBIC AND ANAEROBIC Blood Culture adequate volume Performed at [facility], [address], [place], [place] [address]    Culture   Final    NO GROWTH < 24 HOURS Performed at [facility], [address], [place], [place] [address]    Report Status PENDING  Incomplete  Culture, blood (Routine x 2)     Status: None (Preliminary result)   Collection Time: 08/02/24 12:35 PM   Specimen: BLOOD LEFT WRIST  Result Value Ref Range Status   Specimen Description   Final    BLOOD LEFT WRIST Performed at [facility], [address], [place], [place] [address]    Special Requests    Final    BOTTLES DRAWN AEROBIC AND ANAEROBIC Blood Culture results may not be optimal due to an inadequate volume of blood received in culture bottles Performed at [facility], [address], [place], [place] [address]    Culture   Final    NO GROWTH < 24 HOURS Performed at [facility], [address] [place], [place] [address]    Report Status PENDING  Incomplete  Urine Culture     Status: Abnormal (Preliminary result)   Collection Time: 08/02/24 12:35 PM   Specimen: Urine, Random  Result Value Ref Range Status   Specimen Description   Final    URINE, RANDOM Performed at [facility], [address] [place], [place]  [address]    Special Requests   Final    NONE Reflexed from [id number] Performed at [facility], [address] [place], [place] [address]    Culture (A)  Final    >=100,000 COLONIES/mL ESCHERICHIA COLI SUSCEPTIBILITIES TO FOLLOW Performed at [facility], [address] [place], [place] [address]    Report Status PENDING  Incomplete    Radiology Studies:  CT Renal Stone Study Result Date: 08/02/2024 CLINICAL DATA:  Flank and abdominal pain. EXAM: CT ABDOMEN AND PELVIS WITHOUT CONTRAST TECHNIQUE: Multidetector CT imaging of the abdomen and pelvis was performed following the standard protocol without IV contrast. RADIATION DOSE REDUCTION: This exam was performed according to the departmental dose-optimization program which includes automated exposure control, adjustment of the mA and/or kV according to patient size and/or use of iterative reconstruction technique. COMPARISON:  CT abdomen and pelvis 03/15/2022 FINDINGS: Lower chest: There is a band of atelectasis in the right lung base with small chronic right pleural effusion. Hepatobiliary: No focal liver abnormality is seen. Status post cholecystectomy. No biliary dilatation. Pancreas: Unremarkable. No pancreatic ductal dilatation or surrounding  inflammatory changes. Spleen: Absent.  Splenules are again seen. Adrenals/Urinary Tract: There is mild nonspecific perinephric fat stranding. There is a 7 mm calculus in the right renal pelvis. There is no hydronephrosis. The bilateral adrenal glands and bladder are within normal limits. Stomach/Bowel: Stomach is within normal limits. No evidence of bowel wall thickening, distention, or inflammatory changes. The appendix is not seen. Vascular/Lymphatic: Aortic atherosclerosis. No enlarged abdominal or pelvic lymph nodes. Reproductive: Status post hysterectomy. No adnexal masses. Other: There is a small fat containing umbilical hernia. There is no ascites. Musculoskeletal: Degenerative changes affect the spine. IMPRESSION: 1. 7 mm calculus in the right renal pelvis. No hydronephrosis. 2. Mild nonspecific perinephric fat stranding. Correlate clinically for infection. 3. Chronic small right pleural effusion. 4. Aortic atherosclerosis. Aortic Atherosclerosis (ICD10-I70.0). Electronically Signed   By: Greig Pique M.D.   On: 08/02/2024 16:37   DG Chest Portable 1 View Result Date: 08/02/2024 CLINICAL DATA:  Cough. EXAM: PORTABLE CHEST 1 VIEW COMPARISON:  03/15/2022 and CT chest 07/29/2024. FINDINGS: Trachea is midline. Heart is at the upper limits of normal in size to mildly enlarged. Thoracic aorta is calcified. No airspace consolidation or pleural fluid. Biapical pleuroparenchymal scarring. IMPRESSION: No acute findings. Electronically Signed   By: Newell Eke M.D.   On: 08/02/2024 13:52    Scheduled Meds:    ascorbic acid   500 mg Oral Daily   aspirin  EC  81 mg Oral Daily   budesonide -glycopyrrolate -formoterol   1 puff Inhalation Daily   cholecalciferol   1,000 Units Oral Daily   enoxaparin  (LOVENOX ) injection  40 mg Subcutaneous Q24H   insulin  aspart  0-15 Units Subcutaneous TID WC   insulin  aspart  0-5 Units Subcutaneous QHS   insulin  glargine  20 Units Subcutaneous Daily   loratadine   10 mg Oral  QHS   montelukast   10 mg Oral QHS   rOPINIRole   2 mg Oral QHS   rosuvastatin   20 mg Oral QHS   tamsulosin   0.4 mg Oral Daily    Continuous Infusions:    cefTRIAXone  (ROCEPHIN )  IV       LOS: 0 days     Trenda Mar, MD,  FACP, [facility], [facility], [facility]   [facility] [place]      To contact the attending provider between 7A-7P or the covering provider during after hours 7P-7A, please log  into the web site [email or web address] and access using universal [place] password for that web site. If you do not have the password, please call the hospital operator.  08/03/2024, 2:35 PM

## 2024-08-03 NOTE — Plan of Care (Signed)

## 2024-08-03 NOTE — Care Management Obs Status (Signed)
 MEDICARE OBSERVATION STATUS NOTIFICATION   Patient Details  Name: Pam Gamble MRN: [id number] Date of Birth: September 25, 1946   Medicare Observation Status Notification Given:  Yes    Doneta Glenys DASEN, RN 08/03/2024, 3:04 PM

## 2024-08-03 NOTE — TOC Initial Note (Signed)
 Transition of Care [facility]) - Initial/Assessment Note    Patient Details  Name: Pam Gamble MRN: [id number] Date of Birth: 1946/04/05  [facility] CM/SW Contact:    Doneta Glenys DASEN, RN Phone Number: 08/03/2024, 4:06 PM  Clinical Narrative:                 MOON completed.Presented for right flank pain. PTA lives in a mobile home alone;PCP/insurance verified;DME-cane,walker,rollator, BSC;denies HH,oxygen  and SDOH needs; Erminio Dimes (friend)[phone] will transport at discharge. No IP CM needs identified during visit.  Expected Discharge Plan: Home/Self Care Barriers to Discharge: No Barriers Identified   Patient Goals and CMS Choice Patient states their goals for this hospitalization and ongoing recovery are:: Home alone CMS Medicare.gov Compare Post Acute Care list provided to::  (NA) Choice offered to / list presented to : NA [place] ownership interest in [facility].provided to:: Parent NA    Expected Discharge Plan and Services In-house Referral: NA Discharge Planning Services: CM Consult   Living arrangements for the past 2 months: Mobile Home                 DME Arranged: N/A DME Agency: NA       HH Arranged: NA HH Agency: NA        Prior Living Arrangements/Services Living arrangements for the past 2 months: Mobile Home Lives with:: Self Patient language and need for interpreter reviewed:: Yes Do you feel safe going back to the place where you live?: Yes      Need for Family Participation in Patient Care: No (Comment) Care giver support system in place?: Yes (comment) Current home services: DME (cane,walker,,rollator, [facility]) Criminal Activity/Legal Involvement Pertinent to Current Situation/Hospitalization: No - Comment as needed  Activities of Daily Living   ADL Screening (condition at time of admission) Independently performs ADLs?: Yes (appropriate for developmental age) Is the patient deaf or have difficulty hearing?:  No Does the patient have difficulty seeing, even when wearing glasses/contacts?: No Does the patient have difficulty concentrating, remembering, or making decisions?: No  Permission Sought/Granted Permission sought to share information with : Case Manager Permission granted to share information with : Yes, Verbal Permission Granted  Share Information with NAME: Dimes Erminio (Friend)  [phone]           Emotional Assessment Appearance:: Appears stated age Attitude/Demeanor/Rapport: Engaged Affect (typically observed): Appropriate Orientation: : Oriented to Self, Oriented to Place, Oriented to  Time, Oriented to Situation Alcohol  / Substance Use: Not Applicable Psych Involvement: No (comment)  Admission diagnosis:  Pyelonephritis [N12] Patient Active Problem List   Diagnosis Date Noted   Pyelonephritis 08/02/2024   Coronary artery calcification 09/09/2023   Hyperkalemia    Renal failure 03/16/2022   Acute on chronic renal failure 03/15/2022   Idiopathic thrombocythemia (HCC) 07/12/2021   Depression    Kidney stones    Shortness of breath    Mixed dyslipidemia 03/28/2020   Dyspnea on exertion 06/22/2019   Diabetes mellitus due to underlying condition with unspecified complications (HCC) 06/22/2019   Ex-smoker 06/22/2019   Chest discomfort 06/22/2019   Pericardial effusion 06/22/2019   Acute biliary pancreatitis 10/01/2018   Acute cholangitis 10/01/2018   COPD (chronic obstructive pulmonary disease) (HCC) 02/09/2018   Low back pain 04/25/2017   Tick bite 01/18/2017   Noncompliance 09/10/2016   Atrophic vaginitis 05/03/2015   Rotator cuff arthropathy 03/21/2015   Edema 09/09/2014   Kidney stone 07/27/2014   Epigastric pain 07/12/2014   Fever, unspecified 07/12/2014  Leg cramp 04/15/2014   Diabetes type 2, uncontrolled 12/27/2013   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   Cough 05/23/2012   Pyelonephritis, acute 02/26/2011   Acute non-recurrent maxillary sinusitis  01/12/2011   C O P D WITH ACUTE EXACERBATION 03/21/2010   TOBACCO USE, QUIT 01/10/2010   Major depressive disorder, recurrent episode 06/14/2009   CAROTID BRUIT 02/09/2008   Allergic rhinitis 01/08/2008   GASTROESOPHAGEAL REFLUX DISEASE 09/26/2007   LATERAL EPICONDYLITIS 09/26/2007   CALCIFIC TENDINITIS 09/26/2007   ADENOCARCINOMA, UTERUS, HX OF 09/26/2007   HIATAL HERNIA, HX OF 09/26/2007   HYSTERECTOMY, PARTIAL, HX OF 09/26/2007   COPD, frequent exacerbations (HCC) 09/15/2007   BARRETTS ESOPHAGUS 09/15/2007   PCP:  Clemmie Nest, MD Pharmacy:   [facility] [address], [place] - [address] [facility] [place] [address] Phone: [phone] Fax: [phone]     Social Drivers of Health (SDOH) Social History: SDOH Screenings   Food Insecurity: No Food Insecurity (08/02/2024)  Housing: Low Risk  (08/02/2024)  Transportation Needs: No Transportation Needs (08/02/2024)  Utilities: Not At Risk (08/02/2024)  Social Connections: Moderately Isolated (08/02/2024)  Tobacco Use: Medium Risk (08/02/2024)   SDOH Interventions:     Readmission Risk Interventions    08/03/2024    4:02 PM  Readmission Risk Prevention Plan  Post Dischage Appt Complete  Medication Screening Complete  Transportation Screening Complete

## 2024-08-04 ENCOUNTER — Other Ambulatory Visit (HOSPITAL_COMMUNITY): Payer: Self-pay

## 2024-08-04 DIAGNOSIS — N12 Tubulo-interstitial nephritis, not specified as acute or chronic: Secondary | ICD-10-CM | POA: Diagnosis not present

## 2024-08-04 LAB — CBC
HCT: 35.5 % — ABNORMAL LOW (ref 36.0–46.0)
Hemoglobin: 10.5 g/dL — ABNORMAL LOW (ref 12.0–15.0)
MCH: 29.1 pg (ref 26.0–34.0)
MCHC: 29.6 g/dL — ABNORMAL LOW (ref 30.0–36.0)
MCV: 98.3 fL (ref 80.0–100.0)
Platelets: 353 K/uL (ref 150–400)
RBC: 3.61 MIL/uL — ABNORMAL LOW (ref 3.87–5.11)
RDW: 13.7 % (ref 11.5–15.5)
WBC: 9.9 K/uL (ref 4.0–10.5)
nRBC: 0 % (ref 0.0–0.2)

## 2024-08-04 LAB — BASIC METABOLIC PANEL WITH GFR
Anion gap: 11 (ref 5–15)
BUN: 18 mg/dL (ref 8–23)
CO2: 21 mmol/L — ABNORMAL LOW (ref 22–32)
Calcium: 8.6 mg/dL — ABNORMAL LOW (ref 8.9–10.3)
Chloride: 104 mmol/L (ref 98–111)
Creatinine, Ser: 1.13 mg/dL — ABNORMAL HIGH (ref 0.44–1.00)
GFR, Estimated: 50 mL/min — ABNORMAL LOW (ref >60)
Glucose, Bld: 155 mg/dL — ABNORMAL HIGH (ref 70–99)
Potassium: 4.3 mmol/L (ref 3.5–5.1)
Sodium: 137 mmol/L (ref 135–145)

## 2024-08-04 LAB — URINE CULTURE: Culture: >=100000 — AB

## 2024-08-04 LAB — GLUCOSE, CAPILLARY
Glucose-Capillary: 128 mg/dL — ABNORMAL HIGH (ref 70–99)
Glucose-Capillary: 116 mg/dL — ABNORMAL HIGH (ref 70–99)

## 2024-08-04 MED ORDER — SODIUM CHLORIDE 0.9 % IV SOLN
2.0000 g | Freq: Once | INTRAVENOUS | Status: AC
  Administered 2024-08-04: 2 g via INTRAVENOUS
  Filled 2024-08-04: qty 20

## 2024-08-04 MED ORDER — CEFADROXIL 500 MG PO CAPS: 500.0000 mg | ORAL_CAPSULE | Freq: Two times a day (BID) | ORAL | 0 refills | Status: AC

## 2024-08-04 NOTE — Plan of Care (Signed)
  Problem: Education: Goal: Ability to describe self-care measures that may prevent or decrease complications (Diabetes Survival Skills Education) will improve Outcome: Progressing Goal: Individualized Educational Video(s) Outcome: Progressing   Problem: Coping: Goal: Ability to adjust to condition or change in health will improve Outcome: Progressing   Problem: Pain Managment: Goal: General experience of comfort will improve and/or be controlled Outcome: Progressing   Problem: Safety: Goal: Ability to remain free from injury will improve Outcome: Progressing

## 2024-08-04 NOTE — Hospital Course (Signed)
 78yo with h/o COPD, DM, stage 3a CKD, and nephrolithiasis who presented on 9/22 with R flank pain and n/v.  She has been treated with multiple rounds of antibiotics recently and has a urology appointment scheduled for 10/10.  UA consistent with UTI, CT with 7 mm R renal pelvis stone without hydronephrosis.  She was treated with Ceftriaxone  for acute R pyelonephritis.  Urine culture with E coli.

## 2024-08-04 NOTE — Progress Notes (Signed)
 Pt discharged. IV removed. AVS printed. Reviewed medications and confirmed pharmacy. Education provided about antibiotics. Encouraged outpatient follow up appointment with Urology on 08/20/24 and pt verbalized understanding. Pt transported off unit in [facility] with staff.

## 2024-08-04 NOTE — Discharge Summary (Signed)
 Physician Discharge Summary   Patient: Pam Gamble MRN: [id number] DOB: 08/14/1946  Admit date:     08/02/2024  Discharge date: 08/04/24  Discharge Physician: Delon Herald   PCP: Clemmie Delon, MD   Recommendations at discharge:   Complete antibiotics - Cefadroxil  two times daily starting 9/25 Follow up with urology on 10/10 as scheduled Follow up with Dr. Clemmie in 1-2 weeks  Discharge Diagnoses: Principal Problem:   Pyelonephritis Active Problems:   GASTROESOPHAGEAL REFLUX DISEASE   COPD (chronic obstructive pulmonary disease) (HCC)   Diabetes mellitus due to underlying condition with unspecified complications (HCC)   Mixed dyslipidemia   Kidney stones   Hospital Course: 78yo with h/o COPD, DM, stage 3a CKD, and nephrolithiasis who presented on 9/22 with R flank pain and n/v.  She has been treated with multiple rounds of antibiotics recently and has a urology appointment scheduled for 10/10.  UA consistent with UTI, CT with 7 mm R renal pelvis stone without hydronephrosis.  She was treated with Ceftriaxone  for acute R pyelonephritis.  Urine culture with E coli.    Assessment and Plan:  E. coli acute right pyelonephritis Nonobstructive nephrolithiasis Met SIRS criteria on admission, sepsis ruled out CT renal stone study: 7 mm calculus in the right renal pelvis.  No hydronephrosis.  Mild nonspecific perinephric fat stranding. UA with cloudy, nitrate positive, many bacteria, significant pyuria Urine culture shows >100 K colonies per mL of E. coli, sensitivities reviewed Blood cultures x 2: Negative to date Continue empirically started IV ceftriaxone  -> Cefadroxil  to complete 7 days Advised to keep the Urology office visit appointment on 08/20/2024 Continue tamsulosin    Type II DM A1c 8.4, uncontrolled Continue Tresiba, Novolog , and Ozempic   COPD Stable without clinical bronchospasm Continue Trelegy, Singulair , as needed DuoNebs, as needed Astelin  spray,  Xyzal   Stage IIIa CKD Stable Avoid nephrotoxic agents when possible   Hyperlipidemia Continue rosuvastatin , ASA   GERD Continue PPI   Normocytic anemia, suspected due to chronic disease Stable  RLS Continue Requip   Chronic HFpEF Last echo was in 10/2023 Continue torsemide     Consultants: TOC team  Procedures: None  Antibiotics: Ceftriaxone  9/22-24 Cephalexin 9/25-28  30 Day Unplanned Readmission Risk Score    Flowsheet Row ED to Hosp-Admission (Current) from 08/02/2024 in [facility]  30 Day Unplanned Readmission Risk Score (%) 14.97 Filed at 08/04/2024 0801    This score is the patient's risk of an unplanned readmission within 30 days of being discharged (0 -100%). The score is based on dignosis, age, lab data, medications, orders, and past utilization.   Low:  0-14.9   Medium: 15-21.9   High: 22-29.9   Extreme: 30 and above           Pain control - [place]  Controlled Substance Reporting System database was reviewed. and patient was instructed, not to drive, operate heavy machinery, perform activities at heights, swimming or participation in water activities or provide baby-sitting services while on Pain, Sleep and Anxiety Medications; until their outpatient Physician has advised to do so again. Also recommended to not to take more than prescribed Pain, Sleep and Anxiety Medications.   Disposition: Home Diet recommendation:  Carb modified diet DISCHARGE MEDICATION: Allergies as of 08/04/2024       Reactions   Ertugliflozin  Anaphylaxis, Anxiety, Other (See Comments)   Steglatro = It made me feel funny. (Steglatro (ertugliflozin ) is a prescription medication used alongside diet and exercise to manage high blood sugar  in adults with type 2 diabetes.)   Amoxicillin-pot Clavulanate Diarrhea, Nausea Only, Other (See Comments)   Sick to stomach Has patient had a PCN reaction causing immediate rash,  facial/tongue/throat swelling, SOB or lightheadedness with hypotension: no Has patient had a PCN reaction causing severe rash involving mucus membranes or skin necrosis: no Has patient had a PCN reaction that required hospitalization: no Has patient had a PCN reaction occurring within the last 10 years: no If all of the above answers are NO, then may proceed with Cephalosporin use.   Levaquin  [levofloxacin ] Nausea Only        Medication List     TAKE these medications    ascorbic acid  500 MG tablet Commonly known as: VITAMIN C  Take 500 mg by mouth daily.   aspirin  EC 81 MG tablet Take 81 mg by mouth daily.   Azelastine  HCl 137 MCG/SPRAY Soln Place 2 sprays into both nostrils 2 (two) times daily as needed (for allergies).   AZO CRANBERRY PO Take 1 capsule by mouth daily.   cefadroxil  500 MG capsule Commonly known as: DURICEF Take 1 capsule (500 mg total) by mouth 2 (two) times daily for 4 days. Start taking on: August 05, 2024   cyanocobalamin 1000 MCG tablet Commonly known as: VITAMIN B12 Take 1,000 mcg by mouth daily.   Fluticasone -Umeclidin-Vilant 100-62.5-25 MCG/INH Aepb Commonly known as: Trelegy Ellipta Inhale 1 puff into the lungs daily.   FreeStyle Libre 2 Sensor Misc Inject 1 Device into the skin every 14 (fourteen) days.   ipratropium-albuterol  0.5-2.5 (3) MG/3ML Soln Commonly known as: DUONEB Take 3 mLs by nebulization every 4 (four) hours as needed (for a COPD exacerbation or prior to performing yard work).   levocetirizine 5 MG tablet Commonly known as: XYZAL Take 5 mg by mouth at bedtime.   montelukast  10 MG tablet Commonly known as: SINGULAIR  Take 10 mg by mouth at bedtime.   NovoLOG  ReliOn 100 UNIT/ML injection Generic drug: insulin  aspart Inject 3-8 Units into the skin See admin instructions. Inject 3-8 units into the skin three times a day before meals AS NEEDED, per sliding scale: BGL 200-224 = 3 UNITS, 225-249 = 4 UNITS, 250-274 = 5  UNITS, 275-299 = 6 UNITS, 300-324 = 7 UNITS, 325-349  = 8 UNITS - MAX NOTES DAILY DOSE: 15 units *as needed*   omeprazole  40 MG capsule Commonly known as: PRILOSEC Take 40 mg by mouth 2 (two) times daily before a meal.   ondansetron  4 MG disintegrating tablet Commonly known as: ZOFRAN -ODT Take 4 mg by mouth every 8 (eight) hours as needed for nausea or vomiting (dissolve orally).   Opcon-A 0.027-0.315 % Soln Generic drug: Naphazoline-Pheniramine Place 1 drop into both eyes 2 (two) times daily as needed (for itching).   Ozempic (1 MG/DOSE) 4 MG/3ML Sopn Generic drug: Semaglutide (1 MG/DOSE) Inject 1 mg into the skin every Saturday.   rOPINIRole  2 MG tablet Commonly known as: REQUIP  Take 2 mg by mouth at bedtime.   rosuvastatin  20 MG tablet Commonly known as: CRESTOR  Take 20 mg by mouth at bedtime.   tamsulosin  0.4 MG Caps capsule Commonly known as: FLOMAX  Take 0.4 mg by mouth daily.   torsemide 10 MG tablet Commonly known as: DEMADEX Take 10 mg by mouth in the morning.   Tresiba FlexTouch 100 UNIT/ML FlexTouch Pen Generic drug: insulin  degludec Inject 34 Units into the skin in the morning.   V-R GAS RELIEF 80 MG chewable tablet Generic drug: simethicone  Chew 1 tablet (80  mg total) by mouth 4 (four) times daily as needed for flatulence (dyspepsia, indigestion).   Vitamin D3 25 MCG (1000 UT) Caps Take 1,000 Units by mouth daily.        Discharge Exam:    Subjective: Feeling better, ambulating without assistance, feels comfortable going home today.  Pain is improved.  She is worried about having another UTI before she sees urology on 10/10.   Objective: Vitals:   08/03/24 2115 08/04/24 0446  BP: 105/61 (!) 104/57  Pulse: (!) 56 67  Resp: 18 20  Temp: 98 F (36.7 C) 97.7 F (36.5 C)  SpO2: 97% 94%    Intake/Output Summary (Last 24 hours) at 08/04/2024 1052 Last data filed at 08/04/2024 0811 Gross per 24 hour  Intake 780 ml  Output --  Net 780 ml    Filed Weights   08/02/24 1808  Weight: 72.8 kg    Exam:  General:  Appears calm and comfortable and is in NAD Eyes:  normal lids, iris ENT:  grossly normal hearing, lips & tongue, mmm Cardiovascular:  RRR.1+ LE edema.  Respiratory:   CTA bilaterally with no wheezes/rales/rhonchi.  Normal respiratory effort. Abdomen:  soft, NT, ND, no CVAT Skin:  no rash or induration seen on limited exam Musculoskeletal:  grossly normal tone BUE/BLE, good ROM, no bony abnormality Psychiatric:  grossly normal mood and affect, speech fluent and appropriate, AOx3 Neurologic:  CN 2-12 grossly intact, moves all extremities in coordinated fashion  Data Reviewed: I have reviewed the patient's lab results since admission.  Pertinent labs for today include:   CO2 21, not clinically significant Glucose 155 BUN 13/Creatinine 1.13/GFR 50, stable WBC 9.9, improved Hgb 10.5, stable Urine culture >100k colonies E coli, resistant to Ampicillin and Unasyn    Condition at discharge: improving  The results of significant diagnostics from this hospitalization (including imaging, microbiology, ancillary and laboratory) are listed below for reference.   Imaging Studies: CT Renal Stone Study Result Date: 08/02/2024 CLINICAL DATA:  Flank and abdominal pain. EXAM: CT ABDOMEN AND PELVIS WITHOUT CONTRAST TECHNIQUE: Multidetector CT imaging of the abdomen and pelvis was performed following the standard protocol without IV contrast. RADIATION DOSE REDUCTION: This exam was performed according to the departmental dose-optimization program which includes automated exposure control, adjustment of the mA and/or kV according to patient size and/or use of iterative reconstruction technique. COMPARISON:  CT abdomen and pelvis 03/15/2022 FINDINGS: Lower chest: There is a band of atelectasis in the right lung base with small chronic right pleural effusion. Hepatobiliary: No focal liver abnormality is seen. Status post  cholecystectomy. No biliary dilatation. Pancreas: Unremarkable. No pancreatic ductal dilatation or surrounding inflammatory changes. Spleen: Absent.  Splenules are again seen. Adrenals/Urinary Tract: There is mild nonspecific perinephric fat stranding. There is a 7 mm calculus in the right renal pelvis. There is no hydronephrosis. The bilateral adrenal glands and bladder are within normal limits. Stomach/Bowel: Stomach is within normal limits. No evidence of bowel wall thickening, distention, or inflammatory changes. The appendix is not seen. Vascular/Lymphatic: Aortic atherosclerosis. No enlarged abdominal or pelvic lymph nodes. Reproductive: Status post hysterectomy. No adnexal masses. Other: There is a small fat containing umbilical hernia. There is no ascites. Musculoskeletal: Degenerative changes affect the spine. IMPRESSION: 1. 7 mm calculus in the right renal pelvis. No hydronephrosis. 2. Mild nonspecific perinephric fat stranding. Correlate clinically for infection. 3. Chronic small right pleural effusion. 4. Aortic atherosclerosis. Aortic Atherosclerosis (ICD10-I70.0). Electronically Signed   By: Greig Pique M.D.   On:  08/02/2024 16:37   DG Chest Portable 1 View Result Date: 08/02/2024 CLINICAL DATA:  Cough. EXAM: PORTABLE CHEST 1 VIEW COMPARISON:  03/15/2022 and CT chest 07/29/2024. FINDINGS: Trachea is midline. Heart is at the upper limits of normal in size to mildly enlarged. Thoracic aorta is calcified. No airspace consolidation or pleural fluid. Biapical pleuroparenchymal scarring. IMPRESSION: No acute findings. Electronically Signed   By: Newell Eke M.D.   On: 08/02/2024 13:52    Microbiology: Results for orders placed or performed during the hospital encounter of 08/02/24  Culture, blood (Routine x 2)     Status: None (Preliminary result)   Collection Time: 08/02/24 12:35 PM   Specimen: BLOOD  Result Value Ref Range Status   Specimen Description   Final    BLOOD RIGHT  ANTECUBITAL Performed at [facility], [address] [place], [place] [address]    Special Requests   Final    BOTTLES DRAWN AEROBIC AND ANAEROBIC Blood Culture adequate volume Performed at [facility], [address] [place], [place] [address]    Culture   Final    NO GROWTH 2 DAYS Performed at [facility], [address] [place], [place] [address]    Report Status PENDING  Incomplete  Culture, blood (Routine x 2)     Status: None (Preliminary result)   Collection Time: 08/02/24 12:35 PM   Specimen: BLOOD LEFT WRIST  Result Value Ref Range Status   Specimen Description   Final    BLOOD LEFT WRIST Performed at [facility], [address], [place], [place] [address]    Special Requests   Final    BOTTLES DRAWN AEROBIC AND ANAEROBIC Blood Culture results may not be optimal due to an inadequate volume of blood received in culture bottles Performed at [facility], [address], [place], [place] [address]    Culture   Final    NO GROWTH 2 DAYS Performed at [facility], [address], [place], [place] [address]    Report Status PENDING  Incomplete  Urine Culture     Status: Abnormal   Collection Time: 08/02/24 12:35 PM   Specimen: Urine, Random  Result Value Ref Range Status   Specimen Description   Final    URINE, RANDOM Performed at [facility], [address] [place], [place] [address]    Special Requests   Final    NONE Reflexed from [id number] Performed at [facility], [address], [place], [place] [address]    Culture >=100,000 COLONIES/mL ESCHERICHIA COLI (A)  Final   Report Status 08/04/2024 FINAL  Final   Organism ID, Bacteria ESCHERICHIA COLI (A)  Final      Susceptibility   Escherichia coli - MIC*    AMPICILLIN >=32 RESISTANT Resistant     CEFAZOLIN (URINE) Value in next row Sensitive      8 SENSITIVEThis is a modified FDA-approved test  that has been validated and its performance characteristics determined by the reporting laboratory.  This laboratory is certified under the Clinical Laboratory Improvement Amendments CLIA as qualified to perform high complexity clinical laboratory testing.    CEFEPIME Value in next row Sensitive      8 SENSITIVEThis is a modified FDA-approved test that has been validated and its performance characteristics determined by the reporting laboratory.  This laboratory is certified under the Clinical Laboratory Improvement Amendments CLIA as qualified to perform high complexity clinical laboratory testing.  ERTAPENEM Value in next row Sensitive      8 SENSITIVEThis is a modified FDA-approved test that has been validated and its performance characteristics determined by the reporting laboratory.  This laboratory is certified under the Clinical Laboratory Improvement Amendments CLIA as qualified to perform high complexity clinical laboratory testing.    CEFTRIAXONE  Value in next row Sensitive      8 SENSITIVEThis is a modified FDA-approved test that has been validated and its performance characteristics determined by the reporting laboratory.  This laboratory is certified under the Clinical Laboratory Improvement Amendments CLIA as qualified to perform high complexity clinical laboratory testing.    CIPROFLOXACIN  Value in next row Sensitive      8 SENSITIVEThis is a modified FDA-approved test that has been validated and its performance characteristics determined by the reporting laboratory.  This laboratory is certified under the Clinical Laboratory Improvement Amendments CLIA as qualified to perform high complexity clinical laboratory testing.    GENTAMICIN  Value in next row Sensitive      8 SENSITIVEThis is a modified FDA-approved test that has been validated and its performance characteristics determined by the reporting laboratory.  This laboratory is certified under the Clinical Laboratory Improvement  Amendments CLIA as qualified to perform high complexity clinical laboratory testing.    NITROFURANTOIN Value in next row Sensitive      8 SENSITIVEThis is a modified FDA-approved test that has been validated and its performance characteristics determined by the reporting laboratory.  This laboratory is certified under the Clinical Laboratory Improvement Amendments CLIA as qualified to perform high complexity clinical laboratory testing.    TRIMETH /SULFA  Value in next row Sensitive      8 SENSITIVEThis is a modified FDA-approved test that has been validated and its performance characteristics determined by the reporting laboratory.  This laboratory is certified under the Clinical Laboratory Improvement Amendments CLIA as qualified to perform high complexity clinical laboratory testing.    AMPICILLIN/SULBACTAM Value in next row Resistant      8 SENSITIVEThis is a modified FDA-approved test that has been validated and its performance characteristics determined by the reporting laboratory.  This laboratory is certified under the Clinical Laboratory Improvement Amendments CLIA as qualified to perform high complexity clinical laboratory testing.    PIP/TAZO Value in next row Sensitive      <=4 SENSITIVEThis is a modified FDA-approved test that has been validated and its performance characteristics determined by the reporting laboratory.  This laboratory is certified under the Clinical Laboratory Improvement Amendments CLIA as qualified to perform high complexity clinical laboratory testing.    MEROPENEM Value in next row Sensitive      <=4 SENSITIVEThis is a modified FDA-approved test that has been validated and its performance characteristics determined by the reporting laboratory.  This laboratory is certified under the Clinical Laboratory Improvement Amendments CLIA as qualified to perform high complexity clinical laboratory testing.    * >=100,000 COLONIES/mL ESCHERICHIA COLI    Labs: CBC: Recent Labs   Lab 08/02/24 1203 08/03/24 0434 08/04/24 0439  WBC 14.6* 12.4* 9.9  NEUTROABS 8.7*  --   --   HGB 11.8* 10.8* 10.5*  HCT 39.6 37.5 35.5*  MCV 97.3 99.5 98.3  PLT 359 332 353   Basic Metabolic Panel: Recent Labs  Lab 08/02/24 1203 08/02/24 1235 08/03/24 0434 08/04/24 0439  NA 138  --  137 137  K 4.0  --  4.3 4.3  CL 104  --  104 104  CO2 22  --  23 21*  GLUCOSE 183*  --  119* 155*  BUN 15  --  15 18  CREATININE 1.21*  --  1.16* 1.13*  CALCIUM  8.8*  --  8.4* 8.6*  MG  --  2.0  --   --    Liver Function Tests: Recent Labs  Lab 08/02/24 1203 08/03/24 0434  AST 17 17  ALT 11 10  ALKPHOS 70 66  BILITOT 0.4 0.3  PROT 6.9 6.1*  ALBUMIN 3.6 3.2*   CBG: Recent Labs  Lab 08/03/24 0807 08/03/24 1226 08/03/24 1712 08/03/24 2116 08/04/24 0807  GLUCAP 139* 145* 171* 152* 128*    Discharge time spent: greater than 30 minutes.  Signed: Delon Herald, MD [facility] 08/04/2024

## 2024-08-05 ENCOUNTER — Other Ambulatory Visit (HOSPITAL_COMMUNITY): Payer: Self-pay

## 2024-08-07 LAB — CULTURE, BLOOD (ROUTINE X 2)
Culture: NO GROWTH
Culture: NO GROWTH
Special Requests: ADEQUATE

## 2024-08-09 DIAGNOSIS — R5383 Other fatigue: Secondary | ICD-10-CM | POA: Diagnosis not present

## 2024-08-09 DIAGNOSIS — J301 Allergic rhinitis due to pollen: Secondary | ICD-10-CM | POA: Diagnosis not present

## 2024-08-09 DIAGNOSIS — J449 Chronic obstructive pulmonary disease, unspecified: Secondary | ICD-10-CM | POA: Diagnosis not present

## 2024-08-09 DIAGNOSIS — R918 Other nonspecific abnormal finding of lung field: Secondary | ICD-10-CM | POA: Diagnosis not present

## 2024-08-12 ENCOUNTER — Telehealth: Payer: Self-pay | Admitting: Pharmacist

## 2024-08-12 NOTE — Progress Notes (Signed)
   08/12/2024  Patient ID: Pam Gamble, female   DOB: 1946/05/14, 78 y.o.   MRN: [id number]  Telephonic engagement with [name] today regarding recent hospital discharge. Patient reports follow up with Urology on 08/20/2024 due to finding of kidney stone. Patient reports completing antibiotics Sunday for UTI. She reports that she saw Pulmonology this past Monday and was told of incidental finding of CAD, patient is on rosuvastatin  20 mg. Informed patient will have office pull all outside notes and coordinate hospital follow up appointment with PCP for further advisement. Called [facility] for Ozempic refill for patient after call with patient today.   Annabella Galla, PharmD Clinical Pharmacist  Direct Dial: [phone]

## 2024-08-16 ENCOUNTER — Other Ambulatory Visit: Payer: Self-pay

## 2024-08-19 ENCOUNTER — Encounter: Payer: Self-pay | Admitting: Cardiology

## 2024-08-19 ENCOUNTER — Ambulatory Visit: Attending: Cardiology | Admitting: Cardiology

## 2024-08-19 VITALS — BP 134/70 | HR 72 | Ht 62.0 in | Wt 159.4 lb

## 2024-08-19 DIAGNOSIS — E782 Mixed hyperlipidemia: Secondary | ICD-10-CM

## 2024-08-19 DIAGNOSIS — I2510 Atherosclerotic heart disease of native coronary artery without angina pectoris: Secondary | ICD-10-CM | POA: Diagnosis not present

## 2024-08-19 DIAGNOSIS — J431 Panlobular emphysema: Secondary | ICD-10-CM

## 2024-08-19 MED ORDER — FISH OIL 1000 MG PO CAPS: 2.0000 | ORAL_CAPSULE | Freq: Two times a day (BID) | ORAL | Status: AC

## 2024-08-19 NOTE — Patient Instructions (Signed)
 Medication Instructions:  Your physician has recommended you make the following change in your medication:   Start 2,000 mg fish oil twice daily  *If you need a refill on your cardiac medications before your next appointment, please call your pharmacy*   Lab Work: None ordered If you have labs (blood work) drawn today and your tests are completely normal, you will receive your results only by: MyChart Message (if you have MyChart) OR A paper copy in the mail If you have any lab test that is abnormal or we need to change your treatment, we will call you to review the results.   Testing/Procedures: None ordered   Follow-Up: At [facility], you and your health needs are our priority.  As part of our continuing mission to provide you with exceptional heart care, we have created designated Provider Care Teams.  These Care Teams include your primary Cardiologist (physician) and Advanced Practice Providers (APPs -  Physician Assistants and Nurse Practitioners) who all work together to provide you with the care you need, when you need it.  We recommend signing up for the patient portal called MyChart.  Sign up information is provided on this After Visit Summary.  MyChart is used to connect with patients for Virtual Visits (Telemedicine).  Patients are able to view lab/test results, encounter notes, upcoming appointments, etc.  Non-urgent messages can be sent to your provider as well.   To learn more about what you can do with MyChart, go to [email or web address].    Your next appointment:   9 month(s)  The format for your next appointment:   In Person  Provider:   Jennifer Crape, MD    Other Instructions none  Important Information About Sugar

## 2024-08-19 NOTE — Progress Notes (Signed)
 Cardiology Office Note:    Date:  08/19/2024   ID:  Pam Gamble, DOB December 03, 1945, MRN [id number]  PCP:  Clemmie Nest, MD  Cardiologist:  Jennifer JONELLE Crape, MD   Referring MD: Clemmie Nest, MD    ASSESSMENT:    1. Coronary artery calcification   2. Panlobular emphysema (HCC)   3. Mixed dyslipidemia    PLAN:    In order of problems listed above:  Coronary artery calcification: Secondary prevention stressed with the patient.  Importance of compliance with diet medication stressed and patient verbalized standing.  She was advised to ambulate to the best of her ability. Mixed dyslipidemia: On lipid-lowering medications.  Lipids reviewed.  Triglyceride elevated.  Diet emphasized.  She was advised to take fish oil 2 g twice daily.  She will get her blood work rechecked at primary care in the next few months. Diabetes mellitus: Elevated hemoglobin A1c.  This is managed by primary care.  Some of the elevation may be because of the fact that she has had sepsis and pyelonephritis.  She is seeing nephrology for this.  Diet emphasized Obesity: Weight reduction stressed diet emphasized and she promises to do better. Patient will be seen in follow-up appointment in 6 months or earlier if the patient has any concerns.    Medication Adjustments/Labs and Tests Ordered: Current medicines are reviewed at length with the patient today.  Concerns regarding medicines are outlined above.  No orders of the defined types were placed in this encounter.  No orders of the defined types were placed in this encounter.    No chief complaint on file.    History of Present Illness:    Pam Gamble is a 78 y.o. female.  Patient has past medical history of coronary artery calcification, mixed dyslipidemia and emphysema.  She denies any problems at this time and takes care of her activities of daily living.  No chest pain orthopnea or PND.  She has diabetes mellitus.  At the time of my  evaluation, the patient is alert awake oriented and in no distress.  Past Medical History:  Diagnosis Date   Acute biliary pancreatitis 10/01/2018   Acute cholangitis (HCC) 10/01/2018   Acute non-recurrent maxillary sinusitis 01/12/2011   Qualifier: Diagnosis of  By: Alvan MD, [name]     Acute on chronic renal failure 03/15/2022   Allergic rhinitis 01/08/2008   Chronic     Atrophic vaginitis 05/03/2015   6/16    Barrett's esophagus    C O P D WITH ACUTE EXACERBATION 03/21/2010   9/13, 11/14, 8/18  [name], Harden GAILS.  Potential benefits of a short/long term steroid  use as well as potential risks  and complications were explained to the patient and were aknowledged.      Calcium  deposits in tendon and bursa    CAROTID BRUIT 02/09/2008   Qualifier: Diagnosis of  By: Plotnikov MD, Karlynn GAILS    Chest discomfort 06/22/2019   COPD (chronic obstructive pulmonary disease) (HCC)    COPD, frequent exacerbations (HCC) 09/15/2007   Dr Theophilus Chronic, severe Advair Albuterol  HHN prn Steroids - frequent use  Pt sleeps in a recliner since 2012    Coronary artery calcification 09/09/2023   Cough 05/23/2012   Depression    Diabetes mellitus due to underlying condition with unspecified complications (HCC) 06/22/2019   Diabetes type 2, uncontrolled 12/27/2013   2/15 2/17 Levemir  - too $$$. StarteToujeo  (10/17) samples Continue with current prescription therapy as reflected on  the Med list - Levemir . Risks associated with treatment noncompliance were discussed. Compliance was encouraged. 2018 Metformin  and NPH insulin  5/18 Use NPH insulin  40 u in am and 10 u at night. Titrate up by 1 unit a day each (AM and HS doses) for goal sugars of 100-130 up to 60    Dyspnea on exertion 06/22/2019   Edema 09/09/2014   10/15 B LE    Epigastric pain 07/12/2014   8/15 lower R>L ? etiol - r/o pyelo and others   Esophageal reflux    Ex-smoker 06/22/2019   Fever, unspecified 07/12/2014   8/15 mild T99F     Hyperkalemia    HYSTERECTOMY, PARTIAL, HX OF 09/26/2007   Qualifier: Diagnosis of   By: Nickola CMA, [name]      Replacing diagnoses that were inactivated after the 02/10/23 regulatory import   Idiopathic thrombocythemia (HCC)    Kidney stone 07/27/2014   2014 Dr Aleene   Kidney stones    LATERAL EPICONDYLITIS 09/26/2007   Qualifier: History of   By: Nickola CMA, [name]      Replacing diagnoses that were inactivated after the 02/10/23 regulatory import   Leg cramp 04/15/2014    Albuterol  can give cramps    Low back pain 04/25/2017   Spinal stenosis   Major depressive disorder, recurrent episode 06/14/2009   Chronic      Mixed dyslipidemia 03/28/2020   Noncompliance 09/10/2016   Recurrent    Pericardial effusion 06/22/2019   Personal history of malignant neoplasm of other parts of uterus    Personal history of other diseases of digestive system    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   Remote  Levaquin  Rx Vaccines    Pyelonephritis 08/02/2024   Pyelonephritis, acute 02/26/2011   2012, 2015 L    Renal failure 03/16/2022   Rotator cuff arthropathy 03/21/2015   Injection 03/21/2015    Shortness of breath    upon exertion   Tick bite 01/18/2017   TOBACCO USE, QUIT 01/10/2010   Qualifier: Diagnosis of  By: Ronnald Ards      Past Surgical History:  Procedure Laterality Date   ABDOMINAL HYSTERECTOMY     CATARACT EXTRACTION Bilateral 01/2016 and 03/2016   CHOLECYSTECTOMY N/A 10/03/2018   Procedure: LAPAROSCOPIC CHOLECYSTECTOMY WITH INTRAOPERATIVE CHOLANGIOGRAM;  Surgeon: Ethyl Lenis, MD;  Location: [facility];  Service: General;  Laterality: N/A;   LITHOTRIPSY     REFRACTIVE SURGERY Right 05/16/2016   SPLENECTOMY  1999   TONSILLECTOMY AND ADENOIDECTOMY      Current Medications: Current Meds  Medication Sig   aspirin  EC 81 MG tablet Take 81 mg by mouth daily.   Azelastine  HCl 137 MCG/SPRAY SOLN Place 2 sprays into both nostrils 2 (two) times daily as needed (for allergies).    Cholecalciferol  (VITAMIN D3) 25 MCG (1000 UT) CAPS Take 1,000 Units by mouth daily.   Cranberry-Vitamin C -Probiotic (AZO CRANBERRY PO) Take 1 capsule by mouth daily.   cyanocobalamin (VITAMIN B12) 1000 MCG tablet Take 1,000 mcg by mouth daily.   Fluticasone -Umeclidin-Vilant (TRELEGY ELLIPTA) 100-62.5-25 MCG/INH AEPB Inhale 1 puff into the lungs daily.   ipratropium-albuterol  (DUONEB) 0.5-2.5 (3) MG/3ML SOLN Take 3 mLs by nebulization every 4 (four) hours as needed (for a COPD exacerbation or prior to performing yard work).   levocetirizine (XYZAL) 5 MG tablet Take 5 mg by mouth at bedtime.   montelukast  (SINGULAIR ) 10 MG tablet Take 10 mg by mouth at bedtime.   NOVOLOG  RELION 100 UNIT/ML injection Inject 3-8 Units into the skin See  admin instructions. Inject 3-8 units into the skin three times a day before meals AS NEEDED, per sliding scale: BGL 200-224 = 3 UNITS, 225-249 = 4 UNITS, 250-274 = 5 UNITS, 275-299 = 6 UNITS, 300-324 = 7 UNITS, 325-349  = 8 UNITS - MAX NOTES DAILY DOSE: 15 units *as needed*   omeprazole  (PRILOSEC) 40 MG capsule Take 40 mg by mouth 2 (two) times daily before a meal.   ondansetron  (ZOFRAN -ODT) 4 MG disintegrating tablet Take 4 mg by mouth every 8 (eight) hours as needed for nausea or vomiting (dissolve orally).   OPCON-A 0.027-0.315 % SOLN Place 1 drop into both eyes 2 (two) times daily as needed (for itching).   OZEMPIC, 1 MG/DOSE, 4 MG/3ML SOPN Inject 1 mg into the skin every Saturday.   rOPINIRole  (REQUIP ) 2 MG tablet Take 2 mg by mouth at bedtime.   rosuvastatin  (CRESTOR ) 20 MG tablet Take 20 mg by mouth at bedtime.   tamsulosin  (FLOMAX ) 0.4 MG CAPS capsule Take 0.4 mg by mouth daily.   torsemide (DEMADEX) 10 MG tablet Take 10 mg by mouth in the morning.   TRESIBA FLEXTOUCH 100 UNIT/ML FlexTouch Pen Inject 34 Units into the skin in the morning.   vitamin C  (ASCORBIC ACID ) 500 MG tablet Take 500 mg by mouth daily.     Allergies:   Ertugliflozin , Amoxicillin-pot  clavulanate, and Levaquin  [levofloxacin ]   Social History   Socioeconomic History   Marital status: Single    Spouse name: Not on file   Number of children: 0   Years of education: Not on file   Highest education level: Not on file  Occupational History   Occupation: retired    Associate Professor: [employer]  Tobacco Use   Smoking status: Former    Current packs/day: 0.00    Average packs/day: 2.0 packs/day for 49.0 years (98.0 ttl pk-yrs)    Types: Cigarettes    Start date: 11/11/1956    Quit date: 11/11/2005    Years since quitting: 18.7   Smokeless tobacco: Never  Vaping Use   Vaping status: Never Used  Substance and Sexual Activity   Alcohol  use: Yes    Alcohol /week: 0.0 standard drinks of alcohol     Comment: beer, whiskey, margherita   Drug use: No   Sexual activity: Not Currently  Other Topics Concern   Not on file  Social History Narrative   Retired - Futures trader   Lives alone   Single   Social Drivers of Health   Financial Resource Strain: Not on file  Food Insecurity: No Food Insecurity (08/02/2024)   Hunger Vital Sign    Worried About Running Out of Food in the Last Year: Never true    Ran Out of Food in the Last Year: Never true  Transportation Needs: No Transportation Needs (08/02/2024)   PRAPARE - Administrator, Civil Service (Medical): No    Lack of Transportation (Non-Medical): No  Physical Activity: Not on file  Stress: Not on file  Social Connections: Moderately Isolated (08/02/2024)   Social Connection and Isolation Panel    Frequency of Communication with Friends and Family: Three times a week    Frequency of Social Gatherings with Friends and Family: More than three times a week    Attends Religious Services: 1 to 4 times per year    Active Member of [employer] or Organizations: No    Attends Banker Meetings: Never    Marital Status: Divorced  Family History: The patient's family history includes Diabetes in  her father, maternal aunt, and sister; Heart disease in her father and mother; Liver cancer in her sister; Pancreatic cancer in her sister; Stroke in her mother.  ROS:   Please see the history of present illness.    All other systems reviewed and are negative.  EKGs/Labs/Other Studies Reviewed:    The following studies were reviewed today: .SABRA   I discussed my findings with the patient at length   Recent Labs: 08/02/2024: Magnesium 2.0 08/03/2024: ALT 10 08/04/2024: BUN 18; Creatinine, Ser 1.13; Hemoglobin 10.5; Platelets 353; Potassium 4.3; Sodium 137  Recent Lipid Panel    Component Value Date/Time   CHOL 228 (H) 12/12/2016 0853   TRIG 114.0 12/12/2016 0853   HDL 52.60 12/12/2016 0853   CHOLHDL 4 12/12/2016 0853   VLDL 22.8 12/12/2016 0853   LDLCALC 153 (H) 12/12/2016 0853   LDLDIRECT 160.1 07/12/2014 1423    Physical Exam:    VS:  BP 134/70   Pulse 72   Ht 5' 2 (1.575 m)   Wt 159 lb 6.4 oz (72.3 kg)   SpO2 95%   BMI 29.15 kg/m     Wt Readings from Last 3 Encounters:  08/19/24 159 lb 6.4 oz (72.3 kg)  08/02/24 160 lb 7.9 oz (72.8 kg)  09/09/23 160 lb (72.6 kg)     GEN: Patient is in no acute distress HEENT: Normal NECK: No JVD; No carotid bruits LYMPHATICS: No lymphadenopathy CARDIAC: Hear sounds regular, 2/6 systolic murmur at the apex. RESPIRATORY:  Clear to auscultation without rales, wheezing or rhonchi  ABDOMEN: Soft, non-tender, non-distended MUSCULOSKELETAL:  No edema; No deformity  SKIN: Warm and dry NEUROLOGIC:  Alert and oriented x 3 PSYCHIATRIC:  Normal affect   Signed, Jennifer JONELLE Crape, MD  08/19/2024 10:51 AM    [facility]

## 2024-08-20 ENCOUNTER — Telehealth: Payer: Self-pay | Admitting: Cardiology

## 2024-08-20 DIAGNOSIS — N200 Calculus of kidney: Secondary | ICD-10-CM | POA: Diagnosis not present

## 2024-08-20 NOTE — Telephone Encounter (Signed)
 Office calling to request that pt's most recent EKG results be sent to their office for upcoming procedure. Please advise

## 2024-08-20 NOTE — Telephone Encounter (Signed)
 Faxed and confirmation received.

## 2024-08-30 DIAGNOSIS — N200 Calculus of kidney: Secondary | ICD-10-CM | POA: Diagnosis not present

## 2024-09-01 DIAGNOSIS — Z6829 Body mass index (BMI) 29.0-29.9, adult: Secondary | ICD-10-CM | POA: Diagnosis not present

## 2024-09-01 DIAGNOSIS — S99922A Unspecified injury of left foot, initial encounter: Secondary | ICD-10-CM | POA: Diagnosis not present

## 2024-09-01 DIAGNOSIS — N200 Calculus of kidney: Secondary | ICD-10-CM | POA: Diagnosis not present

## 2024-09-01 DIAGNOSIS — Z794 Long term (current) use of insulin: Secondary | ICD-10-CM | POA: Diagnosis not present

## 2024-09-01 DIAGNOSIS — E11649 Type 2 diabetes mellitus with hypoglycemia without coma: Secondary | ICD-10-CM | POA: Diagnosis not present

## 2024-09-01 DIAGNOSIS — J439 Emphysema, unspecified: Secondary | ICD-10-CM | POA: Diagnosis not present

## 2024-09-23 DIAGNOSIS — N200 Calculus of kidney: Secondary | ICD-10-CM | POA: Diagnosis not present

## 2024-09-23 DIAGNOSIS — N390 Urinary tract infection, site not specified: Secondary | ICD-10-CM | POA: Diagnosis not present
